# Patient Record
Sex: Female | Born: 1950
Health system: Southern US, Community
[De-identification: ages and names within clinical notes are randomized; demographics above are authoritative.]

## PROBLEM LIST (undated history)

## (undated) DIAGNOSIS — Z8489 Family history of other specified conditions: Secondary | ICD-10-CM

## (undated) DIAGNOSIS — M1990 Unspecified osteoarthritis, unspecified site: Secondary | ICD-10-CM

## (undated) DIAGNOSIS — T4145XA Adverse effect of unspecified anesthetic, initial encounter: Secondary | ICD-10-CM

## (undated) DIAGNOSIS — Z86018 Personal history of other benign neoplasm: Secondary | ICD-10-CM

## (undated) DIAGNOSIS — Z8041 Family history of malignant neoplasm of ovary: Secondary | ICD-10-CM

## (undated) DIAGNOSIS — L4050 Arthropathic psoriasis, unspecified: Secondary | ICD-10-CM

## (undated) DIAGNOSIS — Z806 Family history of leukemia: Secondary | ICD-10-CM

## (undated) DIAGNOSIS — T8859XA Other complications of anesthesia, initial encounter: Secondary | ICD-10-CM

## (undated) DIAGNOSIS — C801 Malignant (primary) neoplasm, unspecified: Secondary | ICD-10-CM

## (undated) DIAGNOSIS — B009 Herpesviral infection, unspecified: Secondary | ICD-10-CM

## (undated) DIAGNOSIS — R9389 Abnormal findings on diagnostic imaging of other specified body structures: Secondary | ICD-10-CM

## (undated) HISTORY — DX: Family history of malignant neoplasm of ovary: Z80.41

## (undated) HISTORY — DX: Abnormal findings on diagnostic imaging of other specified body structures: R93.89

## (undated) HISTORY — DX: Herpesviral infection, unspecified: B00.9

## (undated) HISTORY — DX: Family history of leukemia: Z80.6

## (undated) HISTORY — DX: Personal history of other benign neoplasm: Z86.018

## (undated) HISTORY — DX: Arthropathic psoriasis, unspecified: L40.50

## (undated) HISTORY — PX: BREAST SURGERY: SHX581

---

## 1981-12-22 HISTORY — PX: THYROIDECTOMY, PARTIAL: SHX18

## 1998-04-19 ENCOUNTER — Ambulatory Visit (HOSPITAL_COMMUNITY): Admission: RE | Admit: 1998-04-19 | Discharge: 1998-04-19 | Payer: Self-pay | Admitting: Podiatry

## 1998-07-12 ENCOUNTER — Ambulatory Visit (HOSPITAL_COMMUNITY): Admission: RE | Admit: 1998-07-12 | Discharge: 1998-07-12 | Payer: Self-pay | Admitting: Podiatry

## 1999-04-22 ENCOUNTER — Other Ambulatory Visit: Admission: RE | Admit: 1999-04-22 | Discharge: 1999-04-22 | Payer: Self-pay | Admitting: Obstetrics and Gynecology

## 1999-05-02 ENCOUNTER — Ambulatory Visit (HOSPITAL_COMMUNITY): Admission: RE | Admit: 1999-05-02 | Discharge: 1999-05-02 | Payer: Self-pay | Admitting: Psychiatry

## 1999-08-30 ENCOUNTER — Ambulatory Visit (HOSPITAL_COMMUNITY): Admission: RE | Admit: 1999-08-30 | Discharge: 1999-08-30 | Payer: Self-pay | Admitting: Psychiatry

## 2000-07-09 ENCOUNTER — Other Ambulatory Visit: Admission: RE | Admit: 2000-07-09 | Discharge: 2000-07-09 | Payer: Self-pay | Admitting: Obstetrics and Gynecology

## 2001-09-23 ENCOUNTER — Other Ambulatory Visit: Admission: RE | Admit: 2001-09-23 | Discharge: 2001-09-23 | Payer: Self-pay | Admitting: Obstetrics and Gynecology

## 2002-01-04 ENCOUNTER — Encounter: Admission: RE | Admit: 2002-01-04 | Discharge: 2002-01-04 | Payer: Self-pay | Admitting: Psychiatry

## 2002-02-22 ENCOUNTER — Encounter: Admission: RE | Admit: 2002-02-22 | Discharge: 2002-02-22 | Payer: Self-pay | Admitting: Psychiatry

## 2002-07-06 ENCOUNTER — Encounter: Admission: RE | Admit: 2002-07-06 | Discharge: 2002-07-06 | Payer: Self-pay | Admitting: Psychiatry

## 2002-07-21 ENCOUNTER — Encounter: Admission: RE | Admit: 2002-07-21 | Discharge: 2002-07-21 | Payer: Self-pay | Admitting: Psychiatry

## 2002-11-08 ENCOUNTER — Other Ambulatory Visit: Admission: RE | Admit: 2002-11-08 | Discharge: 2002-11-08 | Payer: Self-pay | Admitting: Obstetrics and Gynecology

## 2003-06-22 ENCOUNTER — Encounter: Admission: RE | Admit: 2003-06-22 | Discharge: 2003-06-22 | Payer: Self-pay | Admitting: Psychiatry

## 2004-01-24 ENCOUNTER — Other Ambulatory Visit: Admission: RE | Admit: 2004-01-24 | Discharge: 2004-01-24 | Payer: Self-pay | Admitting: Obstetrics and Gynecology

## 2005-04-24 ENCOUNTER — Other Ambulatory Visit: Admission: RE | Admit: 2005-04-24 | Discharge: 2005-04-24 | Payer: Self-pay | Admitting: Obstetrics and Gynecology

## 2005-06-05 ENCOUNTER — Emergency Department (HOSPITAL_COMMUNITY): Admission: EM | Admit: 2005-06-05 | Discharge: 2005-06-06 | Payer: Self-pay | Admitting: Emergency Medicine

## 2005-07-22 HISTORY — PX: COLONOSCOPY: SHX174

## 2005-08-08 ENCOUNTER — Ambulatory Visit (HOSPITAL_COMMUNITY): Admission: RE | Admit: 2005-08-08 | Discharge: 2005-08-08 | Payer: Self-pay | Admitting: Gastroenterology

## 2005-11-21 DIAGNOSIS — R9389 Abnormal findings on diagnostic imaging of other specified body structures: Secondary | ICD-10-CM

## 2005-11-21 HISTORY — DX: Abnormal findings on diagnostic imaging of other specified body structures: R93.89

## 2005-12-09 ENCOUNTER — Ambulatory Visit (HOSPITAL_COMMUNITY): Admission: RE | Admit: 2005-12-09 | Discharge: 2005-12-09 | Payer: Self-pay | Admitting: Internal Medicine

## 2005-12-22 DIAGNOSIS — Z86018 Personal history of other benign neoplasm: Secondary | ICD-10-CM

## 2005-12-22 HISTORY — DX: Personal history of other benign neoplasm: Z86.018

## 2006-01-09 ENCOUNTER — Ambulatory Visit (HOSPITAL_COMMUNITY): Admission: RE | Admit: 2006-01-09 | Discharge: 2006-01-09 | Payer: Self-pay | Admitting: Otolaryngology

## 2006-03-17 ENCOUNTER — Ambulatory Visit (HOSPITAL_COMMUNITY): Payer: Self-pay | Admitting: Psychiatry

## 2006-06-05 ENCOUNTER — Other Ambulatory Visit: Admission: RE | Admit: 2006-06-05 | Discharge: 2006-06-05 | Payer: Self-pay | Admitting: Obstetrics and Gynecology

## 2006-07-06 ENCOUNTER — Ambulatory Visit (HOSPITAL_COMMUNITY): Payer: Self-pay | Admitting: Psychiatry

## 2006-08-05 ENCOUNTER — Encounter: Admission: RE | Admit: 2006-08-05 | Discharge: 2006-08-05 | Payer: Self-pay | Admitting: Internal Medicine

## 2006-10-05 ENCOUNTER — Ambulatory Visit (HOSPITAL_COMMUNITY): Payer: Self-pay | Admitting: Psychiatry

## 2006-12-09 ENCOUNTER — Encounter: Admission: RE | Admit: 2006-12-09 | Discharge: 2006-12-09 | Payer: Self-pay | Admitting: Internal Medicine

## 2007-07-09 ENCOUNTER — Other Ambulatory Visit: Admission: RE | Admit: 2007-07-09 | Discharge: 2007-07-09 | Payer: Self-pay | Admitting: Obstetrics and Gynecology

## 2007-07-27 ENCOUNTER — Encounter: Admission: RE | Admit: 2007-07-27 | Discharge: 2007-07-27 | Payer: Self-pay | Admitting: Internal Medicine

## 2008-02-15 ENCOUNTER — Encounter: Admission: RE | Admit: 2008-02-15 | Discharge: 2008-02-15 | Payer: Self-pay | Admitting: Internal Medicine

## 2008-08-06 IMAGING — CT CT CHEST W/O CM
2 of 3 series · 15 of 36 positions shown, 18 images · IV contrast (agent unspecified)
Comparison: 07/27/07 and multiple previous films.

CLINICAL DATA: Follow-up lung nodule. 
 CHEST CT WITHOUT CONTRAST:
TECHNIQUE: Multidetector CT imaging of the chest was performed following the standard protocol without IV contrast.

[Series 2: routine chest · axial · 0.70mm/px · z∈[-245,+10]mm · 12 of 61 slices shown, 15 images]
[im 5/61  mediastinal]
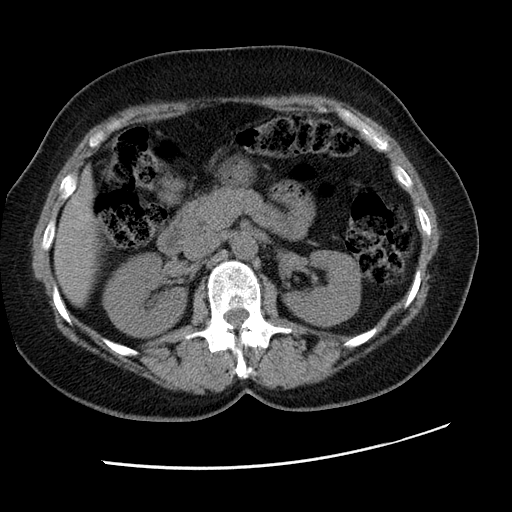
[im 5/61  lung]
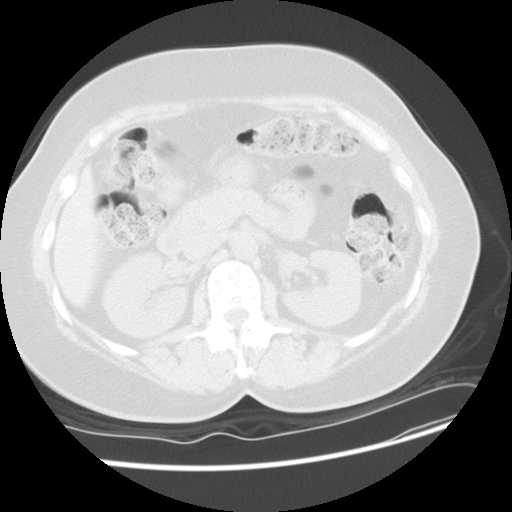
[im 9/61  lung]
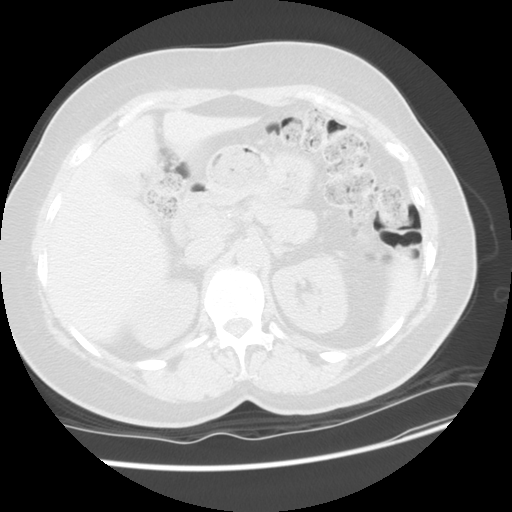
[im 14/61  lung]
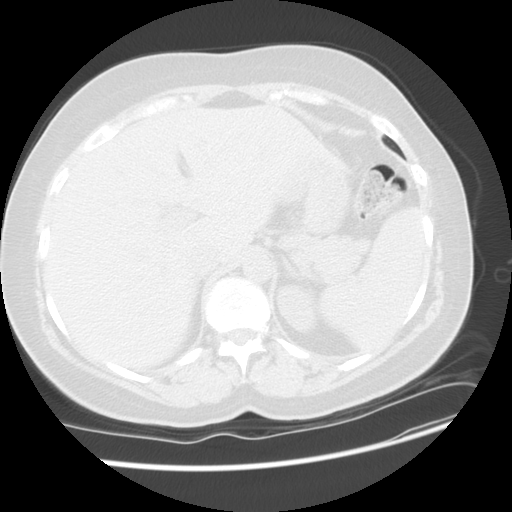
[im 18/61  lung]
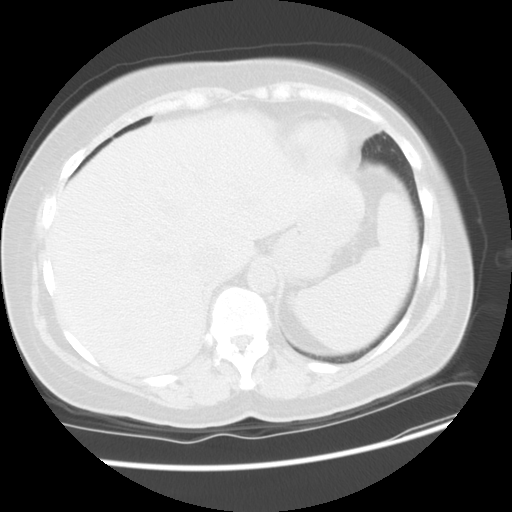
[im 23/61  mediastinal]
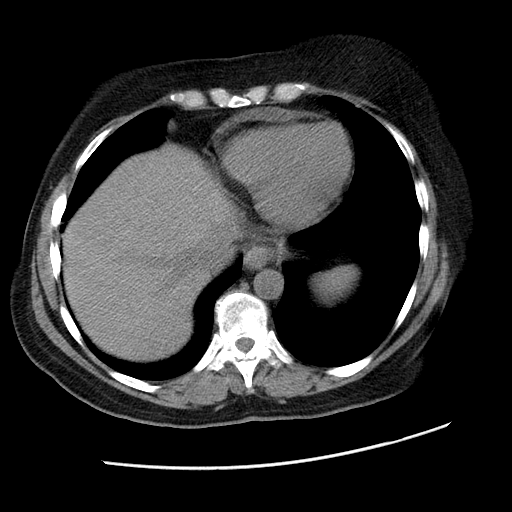
[im 23/61  lung]
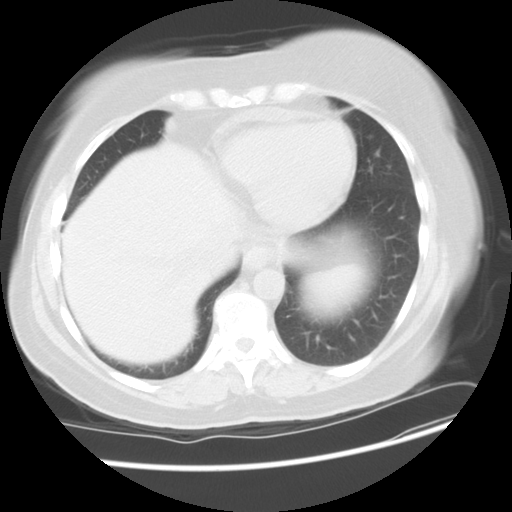
[im 27/61  lung]
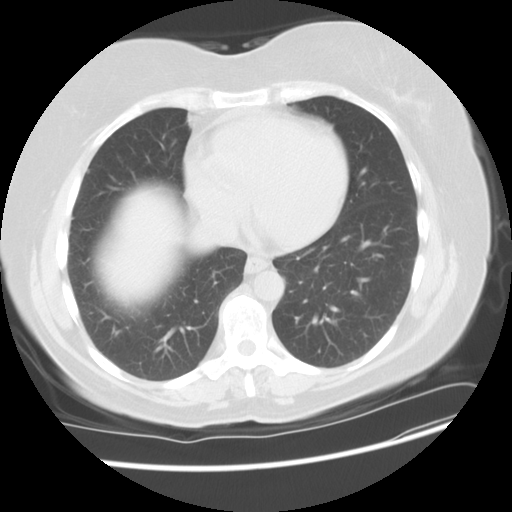
[im 34/61  lung]
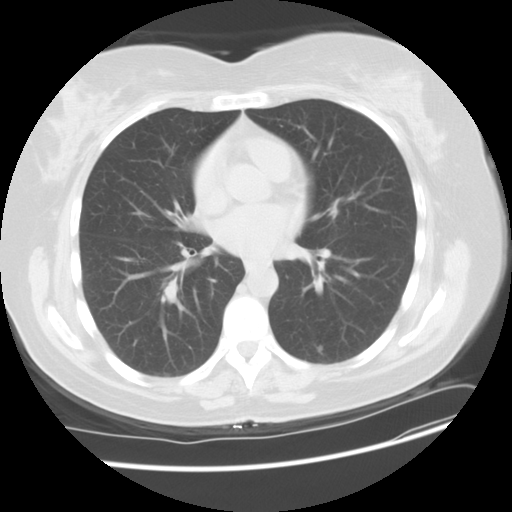
[im 38/61  lung]
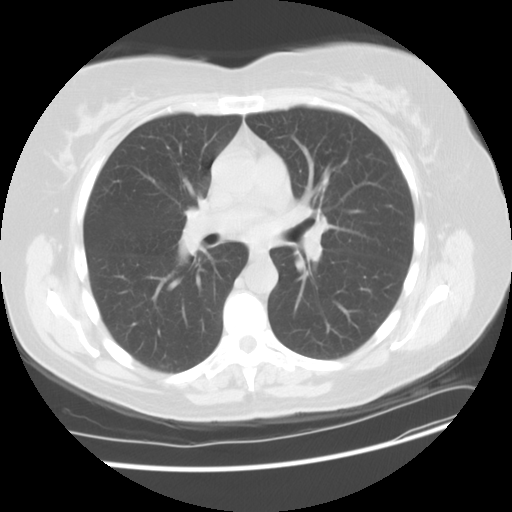
[im 43/61  mediastinal]
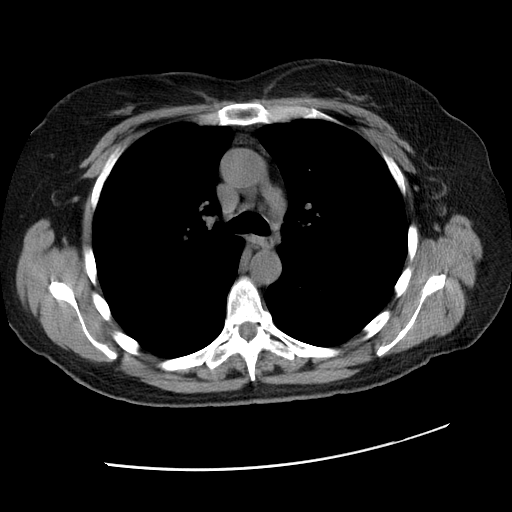
[im 43/61  lung]
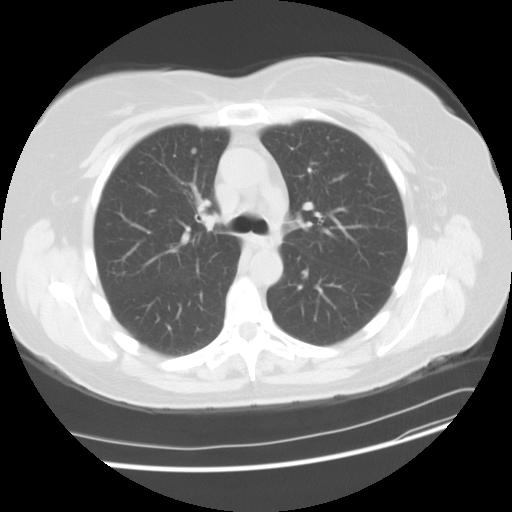
[im 47/61  lung]
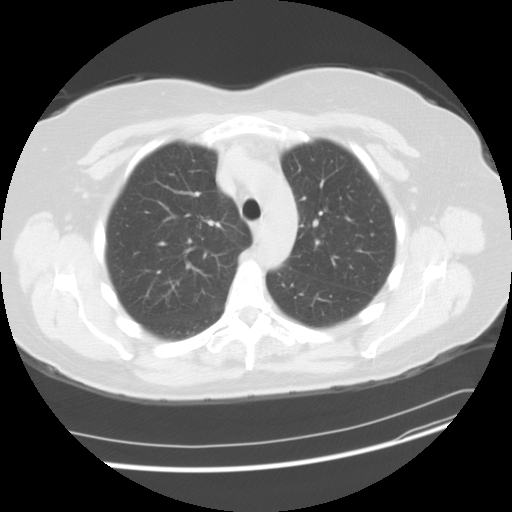
[im 52/61  lung]
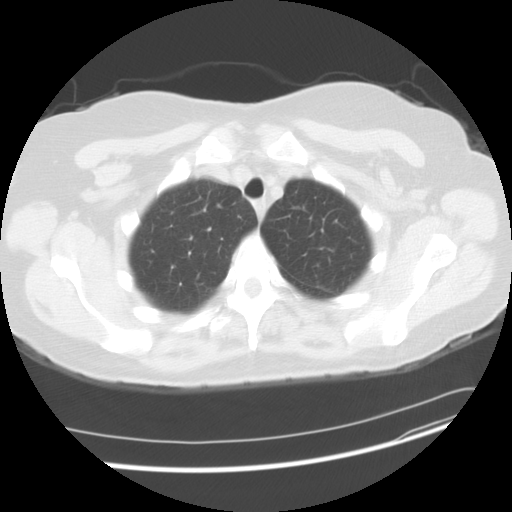
[im 56/61  lung]
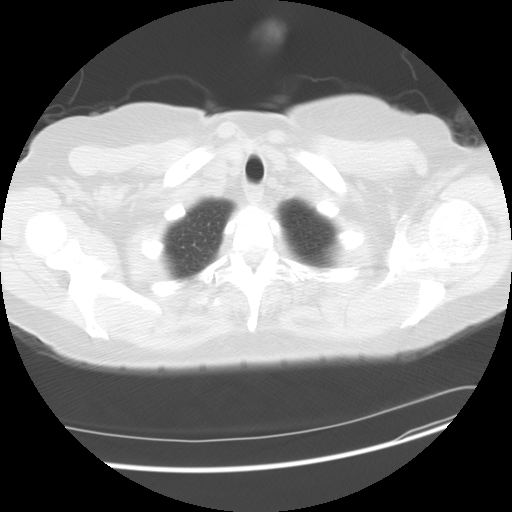

[Series 401: coronal · coronal · 0.70mm/px · 3 of 120 slices shown]
[im 24/120  lung]
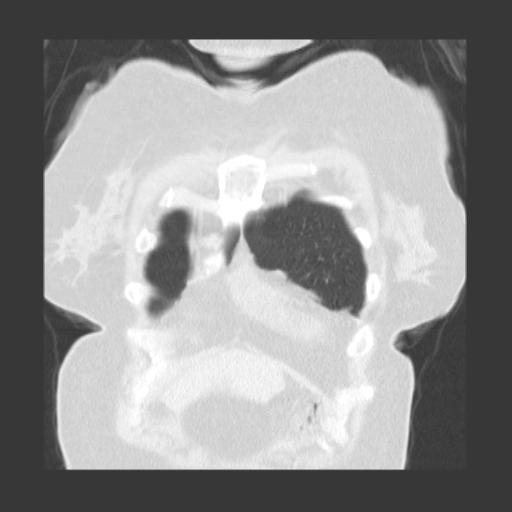
[im 48/120  lung]
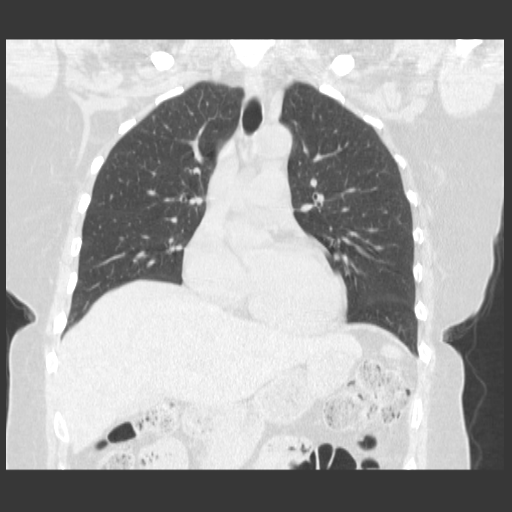
[im 72/120  lung]
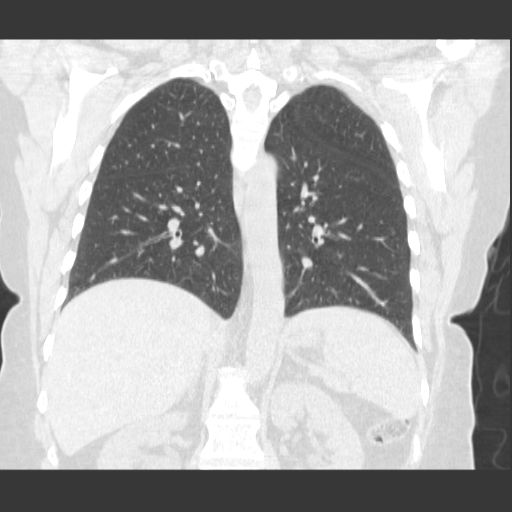

[15 of 36 positions shown; findings below may reference images not displayed]

FINDINGS: A 3 mm nodule previously described in the left lower lobe on image #24 is not evident to my evaluation.  Tiny subpleural densities depicted elsewhere in both lungs appear the same.  A 4 mm nodule anteriorly in the right upper lobe on image 19 is the same.  A 4 to 5 mm nodule posteriorly in the left lower lobe on image 29 is the same.  A 2 mm nodule anteriorly in the left upper lobe on image 34 is the same.  There are no worrisome features.  A small amount of pericardial fluid remains evident.  No pleural fluid.  No adenopathy or mass.  Scans in the upper abdomen remain unremarkable.
IMPRESSION: 1.  Chronically noted nodules all appear the same and are undoubtedly benign. 
 2.  Tiny nodule questioned in the left lower lobe on the previous exam cannot be identified today.  This does not concern me based on the fact that it was such a minimal finding on the previous study and may not even definitely have represented a nodule.  In any case, there is nothing seen in that area today.

## 2008-08-31 ENCOUNTER — Other Ambulatory Visit: Admission: RE | Admit: 2008-08-31 | Discharge: 2008-08-31 | Payer: Self-pay | Admitting: Obstetrics and Gynecology

## 2011-05-09 NOTE — Op Note (Signed)
Samantha Sullivan, TRUXILLO NO.:  [id number]   MEDICAL RECORD NO.:  [id number]          PATIENT TYPE:  AMB   LOCATION:  ENDO                         FACILITY:  [facility]   PHYSICIAN:  Anselmo Rod, M.D.  DATE OF BIRTH:  Aug 22, 1951   DATE OF PROCEDURE:  08/08/2005  DATE OF DISCHARGE:                                 OPERATIVE REPORT   PROCEDURE PERFORMED:  Screening colonoscopy.   ENDOSCOPIST:  Anselmo Rod, M.D.   INSTRUMENT USED:  Olympus video colonoscope.   INDICATIONS FOR PROCEDURE:  A 60 year old white female underwent a screening  colonoscopy to rule out colonic polyps, masses, etc.   PREPROCEDURE PREPARATION:  Informed consent was procured from the patient.  The patient was fasted for eight hours prior to procedure and prepped with a  bottle of magnesium citrate and gallon of GoLYTELY the night prior to  procedure.  Risks and benefits of the procedure including a 10% risk rate of  cancer and polyps were discussed with her as well.   PREPROCEDURE PHYSICAL:  VITAL SIGNS:  The patient has stable vital signs.  NECK:  Supple.  CHEST:  Clear to auscultation.  CARDIAC:  S1, S2 regular.  ABDOMEN:  Soft with normal bowel sounds.   DESCRIPTION OF PROCEDURE:  The patient was placed in the left lateral  decubitus position and sedated with 50 mg of Demerol and 5 mg of Versed in  slow incremental doses.  Once the patient was adequately sedated and  maintained on low-flow oxygen and continuous cardiac monitoring, the Olympus  video colonoscope was advanced from the rectum to the cecum.  The  appendicular orifice and ileocecal valve were clearly visualized and  photographed.  No masses, polyps, erosions, ulcerations, or diverticula was  seen.  Retroflexion revealed some small internal hemorrhoids.  The patient  tolerated the procedure well without complications.   IMPRESSION:  1.  Normal colonoscopy of the cecum except for small nonbleeding internal       hemorrhoids.  2.  No masses or polyps seen.  3.  No evidence of diverticulosis.   RECOMMENDATIONS:  1.  Continue high fiber diet with liberal fluid intake.  2.  Repeat colonoscopy in the next 10 years unless the patient were to      develop any abnormal symptoms in the interim.  In which case, she should      contact the office immediately.  3.  Outpatient followup as need arises in the future.      Anselmo Rod, M.D.  Electronically Signed     JNM/MEDQ  D:  08/08/2005  T:  08/08/2005  Job:  [id number]   cc:   Gwen Pounds, MD  Fax: [phone]

## 2013-10-24 ENCOUNTER — Encounter: Payer: Self-pay | Admitting: Nurse Practitioner

## 2013-10-24 ENCOUNTER — Ambulatory Visit (INDEPENDENT_AMBULATORY_CARE_PROVIDER_SITE_OTHER): Payer: Commercial Managed Care - PPO | Admitting: Nurse Practitioner

## 2013-10-24 VITALS — BP 120/76 | HR 64 | Resp 16 | Ht 65.0 in | Wt 161.0 lb

## 2013-10-24 DIAGNOSIS — Z01419 Encounter for gynecological examination (general) (routine) without abnormal findings: Secondary | ICD-10-CM

## 2013-10-24 DIAGNOSIS — E559 Vitamin D deficiency, unspecified: Secondary | ICD-10-CM

## 2013-10-24 MED ORDER — VITAMIN D (ERGOCALCIFEROL) 1.25 MG (50000 UNIT) PO CAPS: 50000.0000 [IU] | ORAL_CAPSULE | ORAL | Status: DC

## 2013-10-24 MED ORDER — ESTRADIOL 0.5 MG PO TABS: 0.5000 mg | ORAL_TABLET | Freq: Every day | ORAL | Status: DC

## 2013-10-24 MED ORDER — PROGESTERONE MICRONIZED 100 MG PO CAPS: 100.0000 mg | ORAL_CAPSULE | Freq: Every day | ORAL | Status: DC

## 2013-10-24 NOTE — Patient Instructions (Signed)

## 2013-10-24 NOTE — Progress Notes (Signed)
Patient ID: Samantha Sullivan, female   DOB: 02-20-1951, 62 y.o.   MRN: [id number] 62 y.o. [id number] Single Caucasian Fe here for annual exam. No complaints about vaginal dryness, not SA.  Rare vaso symptoms.   Mother is doing the same, still wheelchair bound secondary to spinal stenosis. She is in nursing home.   She still plans on working 2 more years.  No LMP recorded. Patient is postmenopausal.    (LMP was in 2001)      Sexually active: no  The current method of family planning is abstinence.    Exercising: yes  Gym/ health club routine includes light weights and walking and pilates everyday.. Smoker:  Former   Health Maintenance: Pap:  10/15/12, WNL, neg HR HPV MMG:  10/20/12, Bi-Rads 1: negative Colonoscopy:  2006 BMD:   07/28/06, normal TDaP:  UTD  plans to check with PCP Labs: PCP-Dr. Timothy Lasso   reports that she quit smoking about 32 years ago. She has never used smokeless tobacco. She reports that she does not drink alcohol or use illicit drugs.  Past Medical History  Diagnosis Date  . Depression   . HSV infection   . Psoriatic arthritis   . History of meningioma 2007    in 2013 stable on MRI and recheck in 5 years  . Abnormal CT of the chest 12/06    numeroous lung nodules - most have resolved.    Past Surgical History  Procedure Laterality Date  . Thyroidectomy, partial Left 1983  . Breast surgery Left 1971    lumpectomy benign  . Colonoscopy  8/06    normal and recheck in 10 years    Current Outpatient Prescriptions  Medication Sig Dispense Refill  . EPIPEN 2-PAK 0.3 MG/0.3ML SOAJ injection as needed.      Marland Kitchen estradiol (ESTRACE) 0.5 MG tablet Take 1 tablet (0.5 mg total) by mouth daily.  90 tablet  3  . folic acid (FOLVITE) 1 MG tablet Take 1 tablet by mouth daily.      . meloxicam (MOBIC) 15 MG tablet Take 15 mg by mouth as needed for pain.      . methotrexate (RHEUMATREX) 2.5 MG tablet Take 8 tablets by mouth once a week.      . Multiple Vitamin (MULTIVITAMIN) tablet  Take 1 tablet by mouth daily.      . progesterone (PROMETRIUM) 100 MG capsule Take 1 capsule (100 mg total) by mouth daily.  90 capsule  3  . Vitamin D, Ergocalciferol, (DRISDOL) 50000 UNITS CAPS capsule Take 1 capsule (50,000 Units total) by mouth once a week.  30 capsule  3   No current facility-administered medications for this visit.    Family History  Problem Relation Age of Onset  . Osteoporosis Mother   . Osteoporosis Sister     ROS:  Pertinent items are noted in HPI.  Otherwise, a comprehensive ROS was negative.  Exam:   BP 120/76  Pulse 64  Resp 16  Ht 5\' 5"  (1.651 m)  Wt 161 lb (73.029 kg)  BMI 26.79 kg/m2 Height: 5\' 5"  (165.1 cm)  Ht Readings from Last 3 Encounters:  10/24/13 5\' 5"  (1.651 m)   Weight last year - 10/15/12 was 191 lbs.  . General appearance: alert, cooperative and appears stated age Head: Normocephalic, without obvious abnormality, atraumatic Neck: no adenopathy, supple, symmetrical, trachea midline and thyroid normal to inspection and palpation Lungs: clear to auscultation bilaterally Breasts: normal appearance, no masses or tenderness Heart: regular rate and rhythm  Abdomen: soft, non-tender; no masses,  no organomegaly Extremities: extremities normal, atraumatic, no cyanosis or edema Skin: Skin color, texture, turgor normal. No rashes or lesions Lymph nodes: Cervical, supraclavicular, and axillary nodes normal. No abnormal inguinal nodes palpated Neurologic: Grossly normal   Pelvic: External genitalia:  no lesions              Urethra:  normal appearing urethra with no masses, tenderness or lesions              Bartholin's and Skene's: normal                 Vagina: normal appearing vagina with normal color and discharge, no lesions              Cervix: anteverted              Pap taken: no Bimanual Exam:  Uterus:  normal size, contour, position, consistency, mobility, non-tender              Adnexa: no mass, fullness, tenderness                Rectovaginal: Confirms               Anus:  normal sphincter tone, no lesions  A:  Well Woman with normal exam  Postmenopausal on HRT since 04/1999  History of Meningioma brain  History of Psoriatic arthritis  Vit D deficiency  Weight Loss intentional    P:   Pap smear as per guidelines   Mammogram is scheduled  Refill on HRT X 1 year  - discussed trying to wean down by 1/2 tablet of Estradiol over this next year  Counseled with risk of HRT of DVT, CVA, cancer, etc  Follow with Vit D labs  Counseled on breast self exam, adequate intake of calcium and vitamin D, diet and exercise return annually or prn  An After Visit Summary was printed and given to the patient.

## 2013-10-26 NOTE — Progress Notes (Signed)
Encounter reviewed by Dr. Skyla Champagne Silva.  

## 2013-11-10 ENCOUNTER — Other Ambulatory Visit: Payer: Self-pay | Admitting: Nurse Practitioner

## 2013-11-30 ENCOUNTER — Encounter: Payer: Self-pay | Admitting: Nurse Practitioner

## 2014-02-21 ENCOUNTER — Telehealth: Payer: Self-pay | Admitting: Nurse Practitioner

## 2014-02-21 NOTE — Telephone Encounter (Signed)
Patient has a question about her account.

## 2014-03-14 ENCOUNTER — Other Ambulatory Visit: Payer: Self-pay | Admitting: Nurse Practitioner

## 2014-10-23 ENCOUNTER — Encounter: Payer: Self-pay | Admitting: Nurse Practitioner

## 2014-10-31 ENCOUNTER — Encounter: Payer: Self-pay | Admitting: Nurse Practitioner

## 2014-10-31 ENCOUNTER — Ambulatory Visit (INDEPENDENT_AMBULATORY_CARE_PROVIDER_SITE_OTHER): Payer: Commercial Managed Care - PPO | Admitting: Nurse Practitioner

## 2014-10-31 VITALS — BP 116/76 | HR 70 | Resp 14 | Ht 65.0 in | Wt 179.6 lb

## 2014-10-31 DIAGNOSIS — Z01419 Encounter for gynecological examination (general) (routine) without abnormal findings: Secondary | ICD-10-CM

## 2014-10-31 MED ORDER — PROGESTERONE MICRONIZED 100 MG PO CAPS: 100.0000 mg | ORAL_CAPSULE | Freq: Every day | ORAL | Status: DC

## 2014-10-31 MED ORDER — ESTRADIOL 0.5 MG PO TABS: 0.5000 mg | ORAL_TABLET | Freq: Every day | ORAL | Status: DC

## 2014-10-31 NOTE — Patient Instructions (Addendum)

## 2014-10-31 NOTE — Progress Notes (Signed)
63 y.o. [id number] Single Caucasian Fe here for annual exam. She is doing well on HRT and wants to continue.  Less vaso symptoms on estrogen.  No new problems. Mother in assisted living but confined to wheelchair.  Patient's last menstrual period was 03/22/2000.          Sexually active: No.  The current method of family planning is post menopausal status.    Exercising: Yes.    Walking,pilates, Physiological scientist 1x/wk Smoker:  no  Health Maintenance: Pap:  10/13/12 NEG HR HPV not indicated  MMG:  10/26/14- Normal  Colonoscopy:  07/2005 negative, repeat in 7 years   BMD:   07/28/2006 - normal  TDaP:  Within 10 years Labs: Shon Baton, MD    reports that she quit smoking about 33 years ago. She has never used smokeless tobacco. She reports that she does not drink alcohol or use illicit drugs.  Past Medical History  Diagnosis Date  . Depression   . HSV infection   . Psoriatic arthritis   . History of meningioma 2007    in 2013 stable on MRI and recheck in 5 years  . Abnormal CT of the chest 12/06    numeroous lung nodules - most have resolved.    Past Surgical History  Procedure Laterality Date  . Thyroidectomy, partial Left 1983  . Breast surgery Left 1971    lumpectomy benign  . Colonoscopy  8/06    normal and recheck in 10 years    Current Outpatient Prescriptions  Medication Sig Dispense Refill  . EPIPEN 2-PAK 0.3 MG/0.3ML SOAJ injection as needed.    Marland Kitchen estradiol (ESTRACE) 0.5 MG tablet Take 1 tablet (0.5 mg total) by mouth daily. 90 tablet 3  . folic acid (FOLVITE) 1 MG tablet Take 1 tablet by mouth daily.    . meloxicam (MOBIC) 15 MG tablet Take 15 mg by mouth as needed for pain.    . methotrexate (RHEUMATREX) 2.5 MG tablet Take 8 tablets by mouth once a week.    . Multiple Vitamin (MULTIVITAMIN) tablet Take 1 tablet by mouth daily.    . progesterone (PROMETRIUM) 100 MG capsule Take 1 capsule (100 mg total) by mouth daily. 90 capsule 3   No current facility-administered  medications for this visit.    Family History  Problem Relation Age of Onset  . Osteoporosis Mother   . Osteoporosis Sister     ROS:  Pertinent items are noted in HPI.  Otherwise, a comprehensive ROS was negative.  Exam:   BP 116/76 mmHg  Pulse 70  Resp 14  Ht 5\' 5"  (1.651 m)  Wt 179 lb 9.6 oz (81.466 kg)  BMI 29.89 kg/m2  LMP 03/22/2000 Height: 5\' 5"  (165.1 cm)  Ht Readings from Last 3 Encounters:  10/31/14 5\' 5"  (1.651 m)  10/24/13 5\' 5"  (1.651 m)    General appearance: alert, cooperative and appears stated age Head: Normocephalic, without obvious abnormality, atraumatic Neck: no adenopathy, supple, symmetrical, trachea midline and thyroid normal to inspection and palpation Lungs: clear to auscultation bilaterally Breasts: normal appearance, no masses or tenderness Heart: regular rate and rhythm Abdomen: soft, non-tender; no masses,  no organomegaly Extremities: extremities normal, atraumatic, no cyanosis or edema Skin: Skin color, texture, turgor normal. No rashes or lesions Lymph nodes: Cervical, supraclavicular, and axillary nodes normal. No abnormal inguinal nodes palpated Neurologic: Grossly normal   Pelvic: External genitalia:  no lesions              Urethra:  normal appearing urethra with no masses, tenderness or lesions              Bartholin's and Skene's: normal                 Vagina: normal appearing vagina with normal color and discharge, no lesions              Cervix: anteverted              Pap taken: No. Bimanual Exam:  Uterus:  normal size, contour, position, consistency, mobility, non-tender              Adnexa: no mass, fullness, tenderness               Rectovaginal: Confirms               Anus:  normal sphincter tone, no lesions  A:  Well Woman with normal exam  Postmenopausal on HRT since 04/1999 History of Meningioma brain History of Psoriatic arthritis Vit D deficiency   P:    Reviewed health and wellness pertinent to exam  Pap smear not taken today  Mammogram is due 11/16  Refill on Estradiol and Prometrium for a year  Counseled with risk of DVT, CVA, cancer, etc.  Counseled on breast self exam, mammography screening, adequate intake of calcium and vitamin D, diet and exercise return annually or prn  An After Visit Summary was printed and given to the patient.

## 2014-11-02 NOTE — Progress Notes (Signed)
Encounter reviewed by Dr. Brook Silva.  

## 2014-11-06 ENCOUNTER — Other Ambulatory Visit: Payer: Self-pay | Admitting: Nurse Practitioner

## 2014-12-22 HISTORY — PX: HAMMER TOE SURGERY: SHX385

## 2014-12-24 ENCOUNTER — Other Ambulatory Visit: Payer: Self-pay | Admitting: Nurse Practitioner

## 2015-03-26 ENCOUNTER — Other Ambulatory Visit: Payer: Self-pay | Admitting: *Deleted

## 2015-03-26 MED ORDER — ESTRADIOL 0.5 MG PO TABS: 0.5000 mg | ORAL_TABLET | Freq: Every day | ORAL | Status: DC

## 2015-03-26 MED ORDER — PROGESTERONE MICRONIZED 100 MG PO CAPS: 100.0000 mg | ORAL_CAPSULE | Freq: Every day | ORAL | Status: DC

## 2015-03-26 NOTE — Telephone Encounter (Signed)
Both rx's were sent to [facility], we had received a request from patient's mail order ([facility]) for 90 day supply.

## 2015-03-26 NOTE — Telephone Encounter (Addendum)
Medication refill request: Estradiol 0.5 mg;  Progesterone 100 mg (Requesting 90 day Supply) Last AEX:  10/31/14 with PG  Next AEX: No AEX scheduled for 2016 Last MMG (if hormonal medication request): 10/26/14 Bi-Rads 2: Benign Refill authorized: #90/2 rfs, please advise.

## 2015-03-26 NOTE — Telephone Encounter (Signed)
Faxed fax to [facility] stating that rx has been sent to pharmacy.

## 2015-03-26 NOTE — Telephone Encounter (Signed)
Per AEX note 10/2014 the refill was given correctly for a year,  Not sure why she needs a refill.  But it is done.

## 2015-03-28 ENCOUNTER — Other Ambulatory Visit: Payer: Self-pay | Admitting: *Deleted

## 2015-03-28 MED ORDER — ESTRADIOL 0.5 MG PO TABS: 0.5000 mg | ORAL_TABLET | Freq: Every day | ORAL | Status: DC

## 2015-03-28 MED ORDER — PROGESTERONE MICRONIZED 100 MG PO CAPS
100.0000 mg | ORAL_CAPSULE | Freq: Every day | ORAL | Status: DC
Start: 2015-03-28 — End: 2015-11-10

## 2015-03-28 NOTE — Telephone Encounter (Addendum)
Medication refill request: Estrace and Progesterone  Last AEX: 10/31/14 PG Next AEX: not scheduled  Last MMG (if hormonal medication request): 10/26/14 BIRADS2:Benign  Refill authorized: Both Rx sent 03/26/15 #90tab/2R to [place].  Request from [place]. New Rx sent to [facility] as prescribed 03/26/15 by PG.  Routed to [place] for final review Encounter closed.

## 2015-03-29 NOTE — Telephone Encounter (Signed)
she was given RX for a year at [place] in November - why a refill now.

## 2015-03-30 NOTE — Telephone Encounter (Signed)
November [facility]. New Request coming from [place].

## 2015-08-09 ENCOUNTER — Encounter: Payer: Self-pay | Admitting: Podiatry

## 2015-08-09 ENCOUNTER — Ambulatory Visit (INDEPENDENT_AMBULATORY_CARE_PROVIDER_SITE_OTHER): Payer: Commercial Managed Care - PPO | Admitting: Podiatry

## 2015-08-09 ENCOUNTER — Ambulatory Visit: Payer: Self-pay

## 2015-08-09 VITALS — BP 134/84 | HR 63 | Resp 16

## 2015-08-09 DIAGNOSIS — L84 Corns and callosities: Secondary | ICD-10-CM

## 2015-08-09 DIAGNOSIS — M79676 Pain in unspecified toe(s): Secondary | ICD-10-CM

## 2015-08-09 DIAGNOSIS — M779 Enthesopathy, unspecified: Secondary | ICD-10-CM | POA: Diagnosis not present

## 2015-08-09 DIAGNOSIS — M2042 Other hammer toe(s) (acquired), left foot: Secondary | ICD-10-CM | POA: Diagnosis not present

## 2015-08-09 MED ORDER — TRIAMCINOLONE ACETONIDE 10 MG/ML IJ SUSP
10.0000 mg | Freq: Once | INTRAMUSCULAR | Status: AC
  Administered 2015-08-09: 10 mg

## 2015-08-09 NOTE — Progress Notes (Signed)
Subjective:     Patient ID: Samantha Sullivan, female   DOB: [birth year], 64 y.o.   MRN: [id number]  HPI patient presents with left fourth toe very painful of several months duration stating she's tried padding trimming and wear different wider shoes without relief and states it one time she had problems with the right toe but that resolved on its own   Review of Systems  All other systems reviewed and are negative.      Objective:   Physical Exam  Constitutional: She is oriented to person, place, and time.  Cardiovascular: Intact distal pulses.   Musculoskeletal: Normal range of motion.  Neurological: She is oriented to person, place, and time.  Skin: Skin is warm.  Nursing note and vitals reviewed.  neurovascular status intact with muscle strength adequate and range of motion within normal limits. Patient's noted to have good digital perfusion is well oriented 3 and is noted to have a painful keratotic lesion on the lateral side of the proximal phalanx fourth toe left with fluid buildup and pain. There is also noted to be a rotation of the fifth toe pressing against the fourth toe causing pain and there is structural keratotic tissue formation     Assessment:     Inflammatory capsulitis of the interphalangeal joint fourth toe along with hammertoe deformity and corn callus formation    Plan:     H&P and x-rays reviewed with patient. Reviewed causes of this problem and discussed treatment and we'll get a try conservative before considering surgical arthroplasty procedure. Today I did a careful capsular injection of the interphalangeal joint with 2 mg dexamethasone Kenalog 5 mg Xylocaine and after appropriate numbness I debrided the lesion fully and applied dressing. I then dispensed padding to take pressure off the toes and patient will reappoint as needed and may require surgery depending on response

## 2015-08-09 NOTE — Progress Notes (Signed)
   Subjective:    Patient ID: Samantha Sullivan, female    DOB: 1951/08/12, 64 y.o.   MRN: [id number]  HPI Pt presents with left 4th toe pain, callus/corn formation. Worsening with time, has tried soaking  Review of Systems  All other systems reviewed and are negative.      Objective:   Physical Exam        Assessment & Plan:

## 2015-10-19 ENCOUNTER — Encounter: Payer: Self-pay | Admitting: Podiatry

## 2015-10-19 ENCOUNTER — Ambulatory Visit (INDEPENDENT_AMBULATORY_CARE_PROVIDER_SITE_OTHER): Payer: Commercial Managed Care - PPO | Admitting: Podiatry

## 2015-10-19 VITALS — BP 110/68 | HR 67 | Resp 16

## 2015-10-19 DIAGNOSIS — M2042 Other hammer toe(s) (acquired), left foot: Secondary | ICD-10-CM

## 2015-10-21 NOTE — Progress Notes (Signed)
Subjective:     Patient ID: Samantha Sullivan, female   DOB: Mar 30, 1951, 64 y.o.   MRN: [id number]  HPI patient presents stating my left toes are really bothering me and it has not improved with the treatments that have been done. Patient has tried to modify shoe gear has tried trimming padding without relief of symptoms   Review of Systems     Objective:   Physical Exam Neurovascular status intact muscle strength adequate range of motion within normal limits with severe keratotic lesions between the fourth and fifth toes on the left foot that are very painful when pressed and we've tried and [place]on Mac treatment she's tried debridement padding wider shoes without relief    Assessment:     Hammertoe deformity fifth digit fourth digit left foot with keratotic lesion formation and extreme pain    Plan:     X-rays reviewed with patient and at this time I have recommended arthroplasty surgery. I allowed patient to read consent form for arthroplasty digits 4 and 5 of the left foot and derotation of the fifth toe. Patient wants surgery signs consent form after extensive review reviewing alternative treatments and complications. Patient understands total recovery can take 6 months to one year and there is no long-term guarantees and she understands this and is given all preoperative instructions. X-ray report did indicate that there is enlargement of the head of the proximal phalanx digits fourth and fifth toes left with rotation of the fifth toe noted

## 2015-10-23 DIAGNOSIS — M2042 Other hammer toe(s) (acquired), left foot: Secondary | ICD-10-CM | POA: Diagnosis not present

## 2015-10-24 ENCOUNTER — Telehealth: Payer: Self-pay | Admitting: *Deleted

## 2015-10-24 NOTE — Telephone Encounter (Addendum)
Pt states she had surgery yesterday, and was unable to sleep due to the [place], and would like a different pain medication, otherwise everything was okay.  Pt states she takes Meloxicam and Methotrexate and wondered if she could continue those.  Dr. Paulla Dolly states may take those as directed and may add OTC regular strength Tylenol as the package directs rather than add Ibuprofen.  I instructed pt of Dr. Mellody Drown orders and told pt to call with concerns.  Pt states understanding.  Received a call from [place], I spoke with the pharmacist and he said they had no call registered for pt or our office.

## 2015-11-02 ENCOUNTER — Ambulatory Visit (INDEPENDENT_AMBULATORY_CARE_PROVIDER_SITE_OTHER): Payer: Commercial Managed Care - PPO | Admitting: Podiatry

## 2015-11-02 ENCOUNTER — Ambulatory Visit: Payer: Self-pay

## 2015-11-02 VITALS — Temp 98.6°F

## 2015-11-02 DIAGNOSIS — Z9889 Other specified postprocedural states: Secondary | ICD-10-CM

## 2015-11-02 DIAGNOSIS — M2042 Other hammer toe(s) (acquired), left foot: Secondary | ICD-10-CM

## 2015-11-04 NOTE — Progress Notes (Signed)
Subjective:     Patient ID: Samantha Sullivan, female   DOB: 1951-07-22, 64 y.o.   MRN: [id number]  HPI patient states my toes are doing real well in good position with minimal discomfort   Review of Systems     Objective:   Physical Exam Neurovascular status intact digits and good alignment with wound edges coapted well    Assessment:     Doing well post arthroplasty digits 45 left    Plan:     Reviewed x-rays reapplied sterile dressing continued open toed shoes and reappoint for suture removal or earlier if needed

## 2015-11-09 ENCOUNTER — Ambulatory Visit (INDEPENDENT_AMBULATORY_CARE_PROVIDER_SITE_OTHER): Payer: Commercial Managed Care - PPO | Admitting: Podiatry

## 2015-11-09 DIAGNOSIS — M2042 Other hammer toe(s) (acquired), left foot: Secondary | ICD-10-CM

## 2015-11-09 DIAGNOSIS — Z9889 Other specified postprocedural states: Secondary | ICD-10-CM

## 2015-11-10 ENCOUNTER — Other Ambulatory Visit: Payer: Self-pay | Admitting: Nurse Practitioner

## 2015-11-10 NOTE — Progress Notes (Signed)
Subjective:     Patient ID: Samantha Sullivan, female   DOB: 1951/11/20, 64 y.o.   MRN: [id number]  HPI patient presents stating my foot is feeling fine now but I did have some debilitating pain around the toes a few days ago that has disappeared   Review of Systems     Objective:   Physical Exam Neurovascular status intact negative Homans sign was noted with well healing surgical sites fourth and fifth digits left foot. I did not note any indication as to why she had the discomfort she is describing currently and hopefully this will be just a temporary type foot    Assessment:     Doing well post arthroplasty fourth and fifth toes with unusual reaction previously that is not present currently    Plan:     Stitches are removed fourth and fifth digits of the left foot and advised on soaks therapy and if the symptoms should reoccur to let us know immediately. Gradual return soft shoe gear in the next 1-2 weeks

## 2015-11-12 NOTE — Telephone Encounter (Signed)
Medication refill request: Progesterone  Last AEX:  10/31/14 PG Next AEX: 11/21/15 PG Last MMG (if hormonal medication request): 10/26/14 BIRADS2:benign  Refill authorized: 03/28/15 #90caps/2R. Today #90/0R?

## 2015-11-20 ENCOUNTER — Encounter: Payer: Self-pay | Admitting: Nurse Practitioner

## 2015-11-20 ENCOUNTER — Telehealth: Payer: Self-pay | Admitting: Nurse Practitioner

## 2015-11-20 NOTE — Telephone Encounter (Signed)
Patient called to cancel appointment for tomorrow 2015-12-13 due to death in the family. Patient will call back and reschedule, recall in, letter mailed to patient.

## 2015-11-21 ENCOUNTER — Ambulatory Visit: Payer: Commercial Managed Care - PPO | Admitting: Nurse Practitioner

## 2015-11-30 ENCOUNTER — Ambulatory Visit (INDEPENDENT_AMBULATORY_CARE_PROVIDER_SITE_OTHER): Payer: Commercial Managed Care - PPO | Admitting: Podiatry

## 2015-11-30 ENCOUNTER — Ambulatory Visit (INDEPENDENT_AMBULATORY_CARE_PROVIDER_SITE_OTHER): Payer: Commercial Managed Care - PPO

## 2015-11-30 DIAGNOSIS — M2042 Other hammer toe(s) (acquired), left foot: Secondary | ICD-10-CM

## 2015-11-30 DIAGNOSIS — Z9889 Other specified postprocedural states: Secondary | ICD-10-CM | POA: Diagnosis not present

## 2015-12-03 NOTE — Progress Notes (Signed)
Subjective:     Patient ID: Samantha Sullivan, female   DOB: 1951/07/21, 64 y.o.   MRN: [id number]  HPI patient states I'm doing well but having mild swelling   Review of Systems     Objective:   Physical Exam Neurovascular status intact with edema in the left fourth and fifth digits which continues to reduce with incision sites that are healed well with wound edges well coapted    Assessment:     Doing well post arthroplasty 45 left    Plan:     Advised on wider shoes and reviewed x-rays. Reappoint to recheck

## 2015-12-11 ENCOUNTER — Encounter: Payer: Self-pay | Admitting: Nurse Practitioner

## 2015-12-11 ENCOUNTER — Ambulatory Visit (INDEPENDENT_AMBULATORY_CARE_PROVIDER_SITE_OTHER): Payer: Commercial Managed Care - PPO | Admitting: Nurse Practitioner

## 2015-12-11 VITALS — BP 136/82 | HR 64 | Ht 64.25 in | Wt 175.0 lb

## 2015-12-11 DIAGNOSIS — R351 Nocturia: Secondary | ICD-10-CM | POA: Diagnosis not present

## 2015-12-11 DIAGNOSIS — Z0000 Encounter for general adult medical examination without abnormal findings: Secondary | ICD-10-CM

## 2015-12-11 DIAGNOSIS — N6310 Unspecified lump in the right breast, unspecified quadrant: Secondary | ICD-10-CM

## 2015-12-11 DIAGNOSIS — E559 Vitamin D deficiency, unspecified: Secondary | ICD-10-CM | POA: Diagnosis not present

## 2015-12-11 DIAGNOSIS — Z01419 Encounter for gynecological examination (general) (routine) without abnormal findings: Secondary | ICD-10-CM | POA: Diagnosis not present

## 2015-12-11 DIAGNOSIS — N63 Unspecified lump in breast: Secondary | ICD-10-CM | POA: Diagnosis not present

## 2015-12-11 LAB — POCT URINALYSIS DIPSTICK
BILIRUBIN UA: NEGATIVE
GLUCOSE UA: NEGATIVE
Ketones, UA: NEGATIVE
Leukocytes, UA: NEGATIVE
NITRITE UA: NEGATIVE
PH UA: 6
Protein, UA: NEGATIVE
RBC UA: NEGATIVE
Urobilinogen, UA: NEGATIVE

## 2015-12-11 MED ORDER — ZOLPIDEM TARTRATE 5 MG PO TABS: 5.0000 mg | ORAL_TABLET | Freq: Every evening | ORAL | Status: DC | PRN

## 2015-12-11 MED ORDER — ESTRADIOL 0.5 MG PO TABS: 0.5000 mg | ORAL_TABLET | Freq: Every day | ORAL | Status: DC

## 2015-12-11 MED ORDER — PROGESTERONE MICRONIZED 100 MG PO CAPS: 100.0000 mg | ORAL_CAPSULE | Freq: Every day | ORAL | Status: DC

## 2015-12-11 NOTE — Progress Notes (Signed)
Patient ID: Samantha Sullivan, female   DOB: 06/27/1951, 64 y.o.   MRN: [id number]  64 y.o. [id number] Widowed  Caucasian Fe here for annual exam.  She has had a traumatic year.  This November an extended family member whom she has cared for 22 years committed suicide.  She found her at the home and has been traumatized with all that has happened.  She is still trying to settle the estate and pay for the bills.   She has been unable to sleep and request a prn sleep aid.  She also noted a lump in her right breast maybe a few weeks ago.  She is due for a screening mammogram and [facility] is to be calling her back for an apt.  She does wish to continue on HRT -but is at 1/2 -1 tablet daily of the  Estradiol.  Sometimes she will have an increase in vaso symptoms and finds that she needs to go back up in dose for awhile.  Mother still lives in assistant living and is in a wheelchair due to spinal stenosis.  She has a new grand daughter born in October this year.  Patient's last menstrual period was 03/22/2000 (approximate).          Sexually active: No.  The current method of family planning is post menopausal status.    Exercising: Yes.    walking daily, pilates and circuit training once weekly.  Unable to exercise recently due to foot surgery. Smoker:  no  Health Maintenance: Pap: 10/13/12 Negative  MMG:10/26/14, Bi-Rads 2: Benign Findings; in process of scheduling Colonoscopy: 07/2005 negative, repeat in 10 years; in process of scheduling  BMD: 07/28/06, 1.7 Spine / 1.3 Right Femur Total / 1.4 Left Femur Total; pt thinks she has had BMD since this time TDaP: Within 10 years Shingles: Not a candidate due to having shingles and taking Methotrexate Pneumonia: Pt declined Hep C and HIV: pt will think about Labs: Dr. Virgina Jock takes care of all labs   reports that she quit smoking about 34 years ago. She has never used smokeless tobacco. She reports that she does not drink alcohol or use illicit drugs.  Past  Medical History  Diagnosis Date  . Depression   . HSV infection   . Psoriatic arthritis ([place])   . History of meningioma 2007    in 2013 stable on MRI and recheck in 5 years  . Abnormal CT of the chest 12/06    numeroous lung nodules - most have resolved.    Past Surgical History  Procedure Laterality Date  . Thyroidectomy, partial Left 1983  . Breast surgery Left 1971    lumpectomy benign  . Colonoscopy  8/06    normal and recheck in 10 years  . Hammer toe surgery Left 2016    Current Outpatient Prescriptions  Medication Sig Dispense Refill  . EPIPEN 2-PAK 0.3 MG/0.3ML SOAJ injection as needed.    Marland Kitchen estradiol (ESTRACE) 0.5 MG tablet Take 1 tablet (0.5 mg total) by mouth daily. 90 tablet 3  . folic acid (FOLVITE) 1 MG tablet Take 1 tablet by mouth daily.    . meloxicam (MOBIC) 15 MG tablet Take 15 mg by mouth as needed for pain.    . methotrexate (RHEUMATREX) 2.5 MG tablet Take 8 tablets by mouth once a week.    . Multiple Vitamin (MULTIVITAMIN) tablet Take 1 tablet by mouth daily.    . progesterone (PROMETRIUM) 100 MG capsule Take 1 capsule (100 mg total) by  mouth daily. 90 capsule 3  . zolpidem (AMBIEN) 5 MG tablet Take 1 tablet (5 mg total) by mouth at bedtime as needed for sleep. 30 tablet 1   No current facility-administered medications for this visit.    Family History  Problem Relation Age of Onset  . Osteoporosis Mother   . Osteoporosis Sister     ROS:  Pertinent items are noted in HPI.  Otherwise, a comprehensive ROS was negative.  Exam:   BP 136/82 mmHg  Pulse 64  Ht 5' 4.25" (1.632 m)  Wt 175 lb (79.379 kg)  BMI 29.80 kg/m2  LMP 03/22/2000 (Approximate) Height: 5' 4.25" (163.2 cm) Ht Readings from Last 3 Encounters:  12/11/15 5' 4.25" (1.632 m)  10/31/14 5\' 5"  (1.651 m)  10/24/13 5\' 5"  (1.651 m)    General appearance: alert, cooperative and appears stated age Head: Normocephalic, without obvious abnormality, atraumatic Neck: no adenopathy, supple,  symmetrical, trachea midline and thyroid normal to inspection and palpation Lungs: clear to auscultation bilaterally Breasts: normal appearance, no masses or tenderness, except for palpable mass at 9:30 position on the right.  Area is mobile and non tender.  No nipple discharge.  On the left breast at previous biopsy site there remains a fibrous tissue at 7:00 position. Heart: regular rate and rhythm Abdomen: soft, non-tender; no masses,  no organomegaly Extremities: extremities normal, atraumatic, no cyanosis or edema Skin: Skin color, texture, turgor normal. No rashes or lesions Lymph nodes: Cervical, supraclavicular, and axillary nodes normal. No abnormal inguinal nodes palpated Neurologic: Grossly normal   Pelvic: External genitalia:  no lesions              Urethra:  normal appearing urethra with no masses, tenderness or lesions              Bartholin's and Skene's: normal                 Vagina: normal appearing vagina with normal color and discharge, no lesions              Cervix: anteverted              Pap taken: Yes.   Bimanual Exam:  Uterus:  normal size, contour, position, consistency, mobility, non-tender              Adnexa: no mass, fullness, tenderness               Rectovaginal: Confirms               Anus:  normal sphincter tone, no lesions  Chaperone present: yes  A:  Well Woman with normal exam  Postmenopausal on HRT since 04/1999 History of Meningioma brain History of Psoriatic arthritis Vit D deficiency  Recent emotional trauma with suicide of a family member  Now insomnia  P:   Reviewed health and wellness pertinent to exam  Pap smear as above  Will get a diagnostic mammogram and [place] and follow  Will refill HRT - but she is aware that will need to get a normal mammogram if she were to continue.  Counseled on risk of DVT, CVA, cancer, etc  Will follow with labs  Will try her on Ambien 5 mg to use prn only # 30/1  refill  Counseled on breast self exam, mammography screening, use and side effects of HRT, adequate intake of calcium and vitamin D, diet and exercise  return annually or prn  An After Visit Summary was printed and given to  the patient. Will get ROI for immunizations and BMD from PCP

## 2015-12-11 NOTE — Progress Notes (Signed)
Scheduled patient while in office for bilateral diagnostic with right breast ultrasound at [facility] on 12/27/2015 at 8:15 am. Patient is agreeable to date and time.

## 2015-12-11 NOTE — Patient Instructions (Signed)

## 2015-12-12 LAB — VITAMIN D 25 HYDROXY (VIT D DEFICIENCY, FRACTURES): Vit D, 25-Hydroxy: 29 ng/mL — ABNORMAL LOW (ref 30–100)

## 2015-12-12 LAB — HIV ANTIBODY (ROUTINE TESTING W REFLEX): HIV 1&2 Ab, 4th Generation: NONREACTIVE

## 2015-12-12 LAB — HEPATITIS C ANTIBODY: HCV AB: NEGATIVE

## 2015-12-12 NOTE — Progress Notes (Signed)
Encounter reviewed Toyoko Silos, MD   

## 2015-12-13 ENCOUNTER — Telehealth: Payer: Self-pay | Admitting: *Deleted

## 2015-12-13 LAB — IPS PAP SMEAR ONLY

## 2015-12-13 NOTE — Telephone Encounter (Signed)
-----   Message from Kem Boroughs, [place] sent at 12/12/2015  8:15 AM EST ----- Please let patient know that Vit D is a little low at 29 and have her to take OTC Vit D at 1000 IU daily.  The HIV and Hep C was negative as suspected.

## 2015-12-13 NOTE — Telephone Encounter (Signed)
Patient returning call. Please call work number. °

## 2015-12-13 NOTE — Telephone Encounter (Signed)
I have attempted to contact this patient by phone with the following results: left message to return call to [place] at [phone] on answering machine (mobile per [facility]).  Name verified in voicemail, advised message is regarding recent labs.  [phone] (Mobile) *Preferred*

## 2015-12-13 NOTE — Telephone Encounter (Signed)
Pt notified in result note.  Closing encounter. 

## 2015-12-20 ENCOUNTER — Encounter: Payer: Self-pay | Admitting: Podiatry

## 2015-12-20 ENCOUNTER — Ambulatory Visit: Payer: Self-pay

## 2015-12-20 ENCOUNTER — Ambulatory Visit (INDEPENDENT_AMBULATORY_CARE_PROVIDER_SITE_OTHER): Payer: Commercial Managed Care - PPO | Admitting: Podiatry

## 2015-12-20 DIAGNOSIS — Z9889 Other specified postprocedural states: Secondary | ICD-10-CM

## 2015-12-20 DIAGNOSIS — M2042 Other hammer toe(s) (acquired), left foot: Secondary | ICD-10-CM

## 2015-12-20 NOTE — Progress Notes (Signed)
DOS 10-23-15  Hammertoe repair 4th and 5th toes left  Rx'd Vicodin 10/325 #25

## 2015-12-20 NOTE — Progress Notes (Signed)
Subjective:     Patient ID: Samantha Sullivan, female   DOB: [birth year], 64 y.o.   MRN: [id number]  HPI patient states my toes are doing really well on my left foot   Review of Systems     Objective:   Physical Exam   Neurovascular status intact muscle strength adequate with range of motion good with patient noted to have well-healed surgical sites fourth and fifth toes left    Assessment:      doing well post hammertoe repair fourth and fifth toes left foot    Plan:      H&P x-rays reviewed and allow patient to return to normal activity. Patient will be seen back as needed

## 2016-01-02 ENCOUNTER — Other Ambulatory Visit: Payer: Self-pay | Admitting: Radiology

## 2016-01-04 ENCOUNTER — Telehealth: Payer: Self-pay | Admitting: *Deleted

## 2016-01-04 DIAGNOSIS — C50411 Malignant neoplasm of upper-outer quadrant of right female breast: Secondary | ICD-10-CM

## 2016-01-04 DIAGNOSIS — Z170 Estrogen receptor positive status [ER+]: Secondary | ICD-10-CM

## 2016-01-04 NOTE — Telephone Encounter (Signed)
Confirmed [facility] for 01/09/16 at 830am .  Instructions and contact information given.

## 2016-01-07 ENCOUNTER — Telehealth: Payer: Self-pay | Admitting: Nurse Practitioner

## 2016-01-07 ENCOUNTER — Telehealth: Payer: Self-pay | Admitting: *Deleted

## 2016-01-07 NOTE — Telephone Encounter (Signed)
Patient has returned my call and she is told to stop her HRT in view of findings of breast cancer.  She has already done that as of Friday.  She is to meet with her care team and wants Dr. Jana Hakim as her Oncologist.

## 2016-01-07 NOTE — Telephone Encounter (Signed)
Left message for pt. to call back.  She has a new diagnosis of breast cancer and is on HRT.  We need for her to stop med's now.

## 2016-01-07 NOTE — Telephone Encounter (Signed)
Received call from patient stating she prefers to see Dr. Excell Seltzer and Dr.Magrinat in clinic.  Explained that Dr. Jana Hakim is not in clinic this week.  Gave her the option of next week in clinic or seeing the surgeon first.  I was able to get an appointment with Dr. Excell Seltzer for 1/24 and Dr. Jana Hakim on 1/24 so she could at least see these MD's on the same day.  I have cancelled her appointment for clinic.  Encouraged her to call with any needs or concerns.

## 2016-01-07 NOTE — Telephone Encounter (Signed)
Patient returning call.

## 2016-01-09 ENCOUNTER — Ambulatory Visit: Payer: Self-pay | Admitting: Physical Therapy

## 2016-01-09 ENCOUNTER — Ambulatory Visit: Payer: Self-pay | Admitting: Hematology

## 2016-01-09 ENCOUNTER — Other Ambulatory Visit: Payer: Self-pay

## 2016-01-15 ENCOUNTER — Other Ambulatory Visit: Payer: Self-pay | Admitting: General Surgery

## 2016-01-15 ENCOUNTER — Ambulatory Visit (HOSPITAL_BASED_OUTPATIENT_CLINIC_OR_DEPARTMENT_OTHER): Payer: Commercial Managed Care - PPO | Admitting: Oncology

## 2016-01-15 ENCOUNTER — Encounter: Payer: Self-pay | Admitting: *Deleted

## 2016-01-15 ENCOUNTER — Other Ambulatory Visit (HOSPITAL_BASED_OUTPATIENT_CLINIC_OR_DEPARTMENT_OTHER): Payer: Commercial Managed Care - PPO

## 2016-01-15 VITALS — BP 149/85 | HR 69 | Temp 97.1°F | Resp 18 | Ht 64.25 in | Wt 177.1 lb

## 2016-01-15 DIAGNOSIS — L4050 Arthropathic psoriasis, unspecified: Secondary | ICD-10-CM | POA: Insufficient documentation

## 2016-01-15 DIAGNOSIS — C50911 Malignant neoplasm of unspecified site of right female breast: Secondary | ICD-10-CM

## 2016-01-15 DIAGNOSIS — Z170 Estrogen receptor positive status [ER+]: Secondary | ICD-10-CM | POA: Diagnosis not present

## 2016-01-15 DIAGNOSIS — C50411 Malignant neoplasm of upper-outer quadrant of right female breast: Secondary | ICD-10-CM | POA: Diagnosis not present

## 2016-01-15 DIAGNOSIS — D329 Benign neoplasm of meninges, unspecified: Secondary | ICD-10-CM | POA: Insufficient documentation

## 2016-01-15 LAB — CBC WITH DIFFERENTIAL/PLATELET
BASO%: 0.7 % (ref 0.0–2.0)
Basophils Absolute: 0.1 10e3/uL (ref 0.0–0.1)
EOS%: 0.4 % (ref 0.0–7.0)
Eosinophils Absolute: 0 10e3/uL (ref 0.0–0.5)
HCT: 43.4 % (ref 34.8–46.6)
HGB: 14.4 g/dL (ref 11.6–15.9)
LYMPH%: 20.6 % (ref 14.0–49.7)
MCH: 31.4 pg (ref 25.1–34.0)
MCHC: 33.2 g/dL (ref 31.5–36.0)
MCV: 94.7 fL (ref 79.5–101.0)
MONO#: 0.6 10e3/uL (ref 0.1–0.9)
MONO%: 6.2 % (ref 0.0–14.0)
NEUT#: 6.6 10e3/uL — ABNORMAL HIGH (ref 1.5–6.5)
NEUT%: 72.1 % (ref 38.4–76.8)
Platelets: 256 10e3/uL (ref 145–400)
RBC: 4.59 10e6/uL (ref 3.70–5.45)
RDW: 14.3 % (ref 11.2–14.5)
WBC: 9.2 10e3/uL (ref 3.9–10.3)
lymph#: 1.9 10e3/uL (ref 0.9–3.3)

## 2016-01-15 LAB — COMPREHENSIVE METABOLIC PANEL
ALT: 23 U/L (ref 0–55)
ANION GAP: 8 meq/L (ref 3–11)
AST: 26 U/L (ref 5–34)
Albumin: 4.1 g/dL (ref 3.5–5.0)
Alkaline Phosphatase: 147 U/L (ref 40–150)
BUN: 13.4 mg/dL (ref 7.0–26.0)
CALCIUM: 9.5 mg/dL (ref 8.4–10.4)
CHLORIDE: 104 meq/L (ref 98–109)
CO2: 27 meq/L (ref 22–29)
Creatinine: 0.8 mg/dL (ref 0.6–1.1)
EGFR: 74 mL/min/{1.73_m2} — AB (ref >90)
Glucose: 90 mg/dl (ref 70–140)
Potassium: 4.2 mEq/L (ref 3.5–5.1)
Sodium: 139 mEq/L (ref 136–145)
Total Bilirubin: 0.53 mg/dL (ref 0.20–1.20)
Total Protein: 7.1 g/dL (ref 6.4–8.3)

## 2016-01-15 NOTE — Progress Notes (Signed)
[facility]  [phone]) [phone] [phone]     ID: [name] DOB: 1951-06-05  MR#: [id number]  WYO#:[id number]  Patient Care Team: Shon Baton, MD as PCP - General (Internal Medicine) Chauncey Cruel, MD as Consulting Physician (Oncology) Kem Boroughs, FNP as Nurse Practitioner (Family Medicine) Wallene Huh, DPM as Consulting Physician (Podiatry) Excell Seltzer, MD as Consulting Physician (General Surgery) Thea Silversmith, MD as Consulting Physician (Radiation Oncology) PCP: [name], MD OTHER MD:  CHIEF COMPLAINT: Estrogen receptor positive breast cancer  CURRENT TREATMENT: Awaiting definitive surgery   BREAST CANCER HISTORY: "[name]"noted a lump in her right breast sometime in late October or early November, but there were "so many things going on in her life" she did not bring it to medical attention until she saw [name] 12/11/2015. [name] was able to palpate a mass at the 9:30 o'clock position in the right breast, which was mobile and nontender. The left breast is status post prior remote biopsy and there was some scar tissue at the 7:00 position. The patient was set up for bilateral diagnostic Mammography with tomosynthesis at [facility] 12/27/2015. This found the breast density to be category C. There was a new oval mass in the right breast upper outer quadrant. Ultrasound the same day confirmed a 2.2 cm mass with irregular margins in the right breast at the 9:00 position read as highly suggestive of malignancy.  Biopsy of the right breast mass in question 01/02/2016 found ([id number]) an invasive ductal carcinoma, grade 1, estrogen receptor 100% positive, progesterone receptor 90% positive, both with strong staining intensity, with an MIB-1 of 10%, and no HER-2 amplification, the signals ratio being 1.32 and the number per cell 2.05. The patient was informed and instructed to stop hormone replacement therapy (last dose  01/04/2016).  Her subsequent history is as detailed below.   INTERVAL HISTORY: Samantha Sullivan was evaluated in the breast clinic 01/15/2016 accompanied by her friend Madlyn Frankel. Her case was also presented in the multidisciplinary breast cancer conference 01/09/2016. At that time a preliminary plan was proposed: Breast conserving surgery with sentinel lymph node sampling, followed by Oncotype, adjuvant radiation, and adjuvant hormone therapy.  REVIEW OF SYSTEMS:  aside from the mass itself, there were no specific symptoms leading to the original mammogram, which was routinely scheduled. The patient denies unusual headaches, visual changes, nausea, vomiting, stiff neck, dizziness, or gait imbalance. There has been no cough, phlegm production, or pleurisy, no chest pain or pressure, and no change in bowel or bladder habits. The patient denies fever, rash, bleeding, unexplained fatigue or unexplained weight loss. Her psoriatic arthritis is treated with methotrexate and well-controlled. She is tolerating the methotrexate with no respiratory problems and her liver function tests are closely followed by Dr. Amil Amen. Her recent hammertoe surgery, which is related, went well. Cathy exercises by going to the gym twice a week and otherwise walking for about an hour most days. A detailed review of systems was otherwise entirely negative.  PAST MEDICAL HISTORY: Past Medical History  Diagnosis Date  . Depression   . HSV infection   . Psoriatic arthritis ([place])   . History of meningioma 2007    in 2013 stable on MRI and recheck in 5 years  . Abnormal CT of the chest 12/06    numeroous lung nodules - most have resolved.    PAST SURGICAL HISTORY: Past Surgical History  Procedure Laterality Date  . Thyroidectomy, partial Left 1983  . Breast surgery Left 1971  lumpectomy benign  . Colonoscopy  8/06    normal and recheck in 10 years  . Hammer toe surgery Left 2016    FAMILY HISTORY Family History  Problem  Relation Age of Onset  . Osteoporosis Mother   . Osteoporosis Sister   Samantha Sullivan has essentially no information on her father's side of the family aside from the fact that he died in his 58s from heart disease. He had psoriasis. The patient's mother is living as of January 2017, age 68. The patient had 2 brothers who died from congenital heart disease shortly after birth. The patient has one sister. The patient's mother had one sister, the patient's aunt, diagnosed with ovarian cancer in her 74s.  GYNECOLOGIC HISTORY:  Patient's last menstrual period was 03/22/2000 (approximate). Menarche age 58, first live birth age 69. The patient is GX P2. She went through menopause approximately 1990 but was taking hormone replacement until 01/04/2016. Since coming off replacement she has had insomnia but no hot flashes or vaginal dryness pleural bones so 4. She did take oral contraceptives remotely for more than 10 years with no complications  SOCIAL HISTORY:  Samantha Sullivan works as a Statistician. She is divorced and lives alone, with no pets. Her daughter [name] lives in [place]. She is disabled secondary to schizoaffective disorder. Daughter [name] lives in [place] and she is a Estate agent but mostly a homemaker. The patient has 3 grandchildren, the older is being 51, the other 2 being under 20 years old. The patient attends [employer] DIRECTIVES: this is being revised, but Samantha Sullivan is intending to name her daughter [name] as her healthcare power of attorney. She can be reached at [place]: Social History  Substance Use Topics  . Smoking status: Former Smoker -- 1.00 packs/day for 12 years    Quit date: 07/23/1981  . Smokeless tobacco: Never Used  . Alcohol Use: No     Colonoscopy: due 2017; [facility]  ZOX:WRUEAVW 2016/[facility]  Bone density:remote  Lipid panel:  Allergies  Allergen Reactions  . Bee Venom    . Norco [Hydrocodone-Acetaminophen] Other (See Comments)    Pt states Norco made her extremely hyper after her surgery and she was not able to sleep.    Current Outpatient Prescriptions  Medication Sig Dispense Refill  . EPIPEN 2-PAK 0.3 MG/0.3ML SOAJ injection as needed.    . folic acid (FOLVITE) 1 MG tablet Take 1 tablet by mouth daily.    . meloxicam (MOBIC) 15 MG tablet Take 15 mg by mouth as needed for pain.    Marland Kitchen zolpidem (AMBIEN) 5 MG tablet Take 1 tablet (5 mg total) by mouth at bedtime as needed for sleep. 30 tablet 1   No current facility-administered medications for this visit.    OBJECTIVE: middle-aged white woman who appears stated age 33 Vitals:   01/15/16 1608  BP: 149/85  Pulse: 69  Temp: 97.1 F (36.2 C)  Resp: 18     Body mass index is 30.16 kg/(m^2).    ECOG FS:0 - Asymptomatic  Ocular: Sclerae unicteric, pupils equal, round and reactive to light Ear-nose-throat: Oropharynx clear and moist Lymphatic: No cervical or supraclavicular adenopathy Lungs no rales or rhonchi, good excursion bilaterally Heart regular rate and rhythm, no murmur appreciated Abd soft, nontender, positive bowel sounds MSK no focal spinal tenderness, no joint edema Neuro: non-focal, well-oriented, appropriate affect Breasts: I do not palpate a well-defined mass in the upper outer  quadrant of the right breast. This area status post recent biopsy. There are no skin or nipple changes of concern. The right axilla is benign. The left breast is status post remote benign lumpectomy. It is otherwise unremarkable. The left axilla is benign.   LAB RESULTS:  CMP     Component Value Date/Time   NA 139 01/15/2016 1554   K 4.2 01/15/2016 1554   CO2 27 01/15/2016 1554   GLUCOSE 90 01/15/2016 1554   BUN 13.4 01/15/2016 1554   CREATININE 0.8 01/15/2016 1554   CALCIUM 9.5 01/15/2016 1554   PROT 7.1 01/15/2016 1554   ALBUMIN 4.1 01/15/2016 1554   AST 26 01/15/2016 1554   ALT 23 01/15/2016  1554   ALKPHOS 147 01/15/2016 1554   BILITOT 0.53 01/15/2016 1554    INo results found for: SPEP, UPEP  Lab Results  Component Value Date   WBC 9.2 01/15/2016   NEUTROABS 6.6* 01/15/2016   HGB 14.4 01/15/2016   HCT 43.4 01/15/2016   MCV 94.7 01/15/2016   PLT 256 01/15/2016      Chemistry      Component Value Date/Time   NA 139 01/15/2016 1554   K 4.2 01/15/2016 1554   CO2 27 01/15/2016 1554   BUN 13.4 01/15/2016 1554   CREATININE 0.8 01/15/2016 1554      Component Value Date/Time   CALCIUM 9.5 01/15/2016 1554   ALKPHOS 147 01/15/2016 1554   AST 26 01/15/2016 1554   ALT 23 01/15/2016 1554   BILITOT 0.53 01/15/2016 1554       No results found for: LABCA2  No components found for: LABCA125  No results for input(s): INR in the last 168 hours.  Urinalysis    Component Value Date/Time   BILIRUBINUR neg 12/11/2015 0925   PROTEINUR neg 12/11/2015 0925   UROBILINOGEN negative 12/11/2015 0925   NITRITE neg 12/11/2015 0925   LEUKOCYTESUR Negative 12/11/2015 0925    STUDIES: Outside studies reviewed  ASSESSMENT: 65 y.o. [place] woman Status post right breast upper outer quadrant biopsy 01/02/2016 for a clinical T2 N0, stage IIA invasive ductal carcinoma, grade 1, estrogen and progesterone receptor positive, HER-2 not amplified, with an MIB-1 of 10%.  (1) breast conserving surgery with sentinel lymph node sampling pending  (2) Oncotype DX will be requested from the final pathology specimen  (3) radiation will follow chemotherapy or surgery depending on Oncotype results  (4) anti-estrogens will follow radiation  (5) genetics testing pending  PLAN: We spent the better part of today's hour-long appointment discussing the biology of breast cancer in general, and the specifics of the patient's tumor in particular. We discussed the difference between local and systemic treatment. Samantha Sullivan understands that there is no survival advantage to mastectomy as compared to  lumpectomy and sentinel lymph node sampling and she is very comfortable with this choice.   As far as systemic therapy is concerned, she will be an excellent candidate for anti-estrogens, which will cut in half her risk of this breast cancer recurring either locally or systemically, and also cut in half her risk of developing a new breast cancer. She would get no benefit from anti-HER-2 immunotherapy.  The benefit from chemotherapy is likely to be marginal in this slow-growing not aggressive looking stage II a breast cancer. The actual margin of benefit is best quantitated with an Oncotype test and this will be requested. Samantha Sullivan understands it takes about 2 weeks to get those results and sometimes we are surprised and a cancer we expected  to be low risk turns out to be the opposite. Accordingly the final decision regarding chemotherapy is not made until those numbers RN  The general sequence then is surgery followed by chemotherapy if chemotherapy is given, followed by radiation, followed by anti-estrogens.  She believes her grandmother had ovarian cancer. She also has no information on her father's side of the family. I believe either of those would qualify her for genetics testing and we will place that referral today.  My expectation is she will likely have surgery before mid February. Accordingly I plan to see her late February or early March to discuss the Oncotype results.  Samantha Sullivan has a good understanding of the overall plan. She agrees with it. She knows the goal of treatment in her case is cure. She will call with any problems that may develop before her next visit here.  Chauncey Cruel, MD   01/15/2016 5:35 PM Medical Oncology and Hematology [facility] [address] [place], [place] [address] Tel. [phone]    Fax. [phone]

## 2016-01-16 ENCOUNTER — Telehealth: Payer: Self-pay | Admitting: Oncology

## 2016-01-16 NOTE — Telephone Encounter (Signed)
Aware of her appointments and rad onc will call her

## 2016-01-18 ENCOUNTER — Encounter: Payer: Self-pay | Admitting: Radiation Oncology

## 2016-01-18 ENCOUNTER — Ambulatory Visit
Admission: RE | Admit: 2016-01-18 | Discharge: 2016-01-18 | Disposition: A | Discharge status: Home / Self Care | Payer: Commercial Managed Care - PPO | Source: Ambulatory Visit | Attending: Radiation Oncology | Admitting: Radiation Oncology

## 2016-01-18 ENCOUNTER — Encounter (HOSPITAL_BASED_OUTPATIENT_CLINIC_OR_DEPARTMENT_OTHER): Payer: Self-pay | Admitting: *Deleted

## 2016-01-18 VITALS — BP 120/75 | HR 73 | Temp 98.1°F | Ht 64.5 in | Wt 176.9 lb

## 2016-01-18 DIAGNOSIS — Z510 Encounter for antineoplastic radiation therapy: Secondary | ICD-10-CM | POA: Insufficient documentation

## 2016-01-18 DIAGNOSIS — Z170 Estrogen receptor positive status [ER+]: Secondary | ICD-10-CM | POA: Insufficient documentation

## 2016-01-18 DIAGNOSIS — C50411 Malignant neoplasm of upper-outer quadrant of right female breast: Secondary | ICD-10-CM | POA: Diagnosis present

## 2016-01-18 NOTE — Progress Notes (Signed)
Radiation Oncology         [phone]) [phone] ________________________________  Initial Outpatient Consultation - Date: 01/18/2016   Name: Samantha Sullivan MRN: [id number]   DOB: [birth year]  REFERRING PHYSICIAN: Magrinat, Samantha Dad, MD  DIAGNOSIS AND STAGE: Breast cancer of upper-outer quadrant of right female breast Samantha Sullivan)   Staging form: Breast, AJCC 7th Edition     Clinical: Stage IIA (T2, N0, M0) - Signed by Samantha Cruel, MD on 01/15/2016  HISTORY OF PRESENT ILLNESS::Samantha Sullivan is a 65 y.o. female who self palpated a right breast lump in either late October or early November. However, due to "so many things going on in her life," she did not bring it to medical attention until she saw Samantha Circle, NP on 12/11/2015. Ms Samantha Sullivan was able to palpate a mass at the 9:30 o'clock position in the right breast, which was mobile and nontender. The patient was set up for bilateral diagnostic mammography with tomosynthesis at [facility] on 12/27/2015. This found the breast density to be category C. There was a new oval mass in the right breast upper outer quadrant. Ultrasound the same day confirmed a 2.2 cm mass with irregular margins in the right breast at the 9:00 position read as highly suggestive of malignancy.  Biopsy of the right breast mass in question, on 01/02/2016, found a grade 1 invasive ductal carcinoma, ER/PR positive, both with strong staining intensity, with an MIB-1 of 10%, and no HER-2 amplification, the signals ratio being 1.32 and the number per cell 2.05. The patient was informed and instructed to stop hormone replacement therapy (last dose was on 01/04/2016). The patient was on methotrexate, for arthritis, and last took it on 01/11/16.  The patient was evaluated in the breast clinic 01/15/2016. Her case was also presented in the multidisciplinary breast cancer conference 01/09/2016. At that time, a preliminary plan was proposed involving breast conservation surgery with sentinel lymph node  sampling, followed by Oncotype DX which would determine if she needs chemotherapy, adjuvant radiation, and adjuvant hormone therapy. The patient presents to me to discuss radiation for the management of her disease.  She reports having a left lumpectomy in 1971 for a benign process. She lost a friend to suicide in October and has not taken time to grieve for this.  PREVIOUS RADIATION THERAPY: No  Past medical, social and family history were reviewed in the electronic chart. Review of symptoms was reviewed in the electronic chart. Medications were reviewed in the electronic chart.   PHYSICAL EXAM:  Filed Vitals:   01/18/16 1422  BP: 120/75  Pulse: 73  Temp: 98.1 F (36.7 C)  .176 lb 14.4 oz (80.241 kg). I did not palpate a mass in the right breast. No palpable cervical, supraclavicular, or axillary adenopathy. Her left breast has a lumpectomy scar inferior to the nipple which is well healed.  IMPRESSION: Clinical stage IIA (T2N0) invasive ductal carcinoma of the right breast. The patient is a good candidate for radiation therapy.  PLAN: I spoke to the patient today regarding her diagnosis and options for treatment. We discussed the equivalence in terms of survival and local failure between mastectomy and breast conservation. We discussed the role of radiation in decreasing local failures in patients who undergo lumpectomy. We discussed the process of CT simulation and the placement tattoos. We discussed 4-6 weeks of treatment as an outpatient. We discussed the possibility of asymptomatic lung damage. We discussed the low likelihood of secondary malignancies. We discussed the possible side effects including but not  limited to skin redness, fatigue, permanent skin darkening, and breast swelling. We discussed the process of simulation and the placement of tattoos. I will see her back after her Oncotype score. I did clarify with her that if she needed chemotherapy this would be performed prior to  radiation. She met with medical oncology as well as a member of our patient family support team and our physical therapist. I will plan on seeing her back after her surgery.  She is scheduled for surgery next Tuesday and genetic counseling is scheduled on 01/30/16.  She is contemplating taking time off to complete radiation and I said this would be fine and she could go on temporary disability until her radiation is complete.  I spent 40 minutes  face to face with the patient and more than 50% of that time was spent in counseling and/or coordination of care.   ------------------------------------------------  Samantha Silversmith, MD  This document serves as a record of services personally performed by Samantha Silversmith, MD. It was created on her behalf by Samantha Sullivan, a trained medical scribe. The creation of this record is based on the scribe's personal observations and the provider's statements to them. This document has been checked and approved by the attending provider.

## 2016-01-18 NOTE — Addendum Note (Signed)
Encounter addended by: Benn Moulder, RN on: 01/18/2016  5:33 PM<BR>     Documentation filed: Charges VN

## 2016-01-18 NOTE — Progress Notes (Signed)
Location of Breast Cancer:Right Breast Cancer  Histology per Pathology Report: 01-02-2016 Diagnosis  Breast, right, needle core biopsy - INVASIVE DUCTAL CARCINOMA WITH ASSOCIATED CALCIFICATIONS. - DUCTAL CARCINOMA IN SITU. - SEE COMMENT. Receptor Status: ER(100%), PR (90%), Her2-neu (neg), Ki-67 (10%)   Samantha Sullivan noted a lump in her right breast sometime in late October or early November, but there were "so many things going on in her life" she did not bring it to medical attention until she saw [name] 12/11/2015. [name] was able to palpate a mass at the 9:30 o'clock position in the right breast, which was mobile and non tender.  Past/Anticipated interventions by surgeon, Dr. Excell Seltzer did a biopsy of the right breast mass in question.   Past/Anticipated interventions by medical oncology, if any:Dr3) Dr. Heywood Iles radiation will follow chemotherapy or surgery depending on Oncotype results. Lurline Del asked [name] informed and instructed Mrs. [name] to stop hormone replacement therapy (last dose 01/04/2016).  Lymphedema issues, if any: No  Pain issues, if any:No  SAFETY ISSUES:  Prior radiation? No  Pacemaker/ICD? No  Possible current pregnancy?No  Is the patient on methotrexate?[name] taken on 01-11-16 having surgery 01-22-16 Current Complaints / other details:    Patient's last menstrual period was 03/22/2000 (approximate). Menarche age 48, first live birth age 57. The patient is GX P2. She went through menopause approximately 1990 but was taking hormone replacement until 01/03/2016. Since coming off replacement she has had insomnia but no hot flashes or vaginal dryness pleural bones so 4. She did take oral contraceptives remotely for more than 10 years with no complications BP 597/41 mmHg  Pulse 73  Temp(Src) 98.1 F (36.7 C) (Oral)  Ht 5' 4.5" (1.638 m)  Wt 176 lb 14.4 oz (80.241 kg)  BMI 29.91 kg/m2  SpO2 99%  LMP 03/22/2000 (Approximate)   Wt Readings from Last 3 Encounters:  01/18/16 176 lb 14.4 oz (80.241 kg)  01/18/16 175 lb (79.379 kg)  01/15/16 177 lb 1.6 oz (80.332 kg)    [name], RN 01/18/2016,10:01 AM

## 2016-01-22 ENCOUNTER — Encounter (HOSPITAL_BASED_OUTPATIENT_CLINIC_OR_DEPARTMENT_OTHER)
Admission: RE | Disposition: A | Discharge status: Home / Self Care | Payer: Self-pay | Source: Ambulatory Visit | Attending: General Surgery

## 2016-01-22 ENCOUNTER — Encounter (HOSPITAL_BASED_OUTPATIENT_CLINIC_OR_DEPARTMENT_OTHER): Payer: Self-pay | Admitting: Anesthesiology

## 2016-01-22 ENCOUNTER — Encounter (HOSPITAL_COMMUNITY)
Admission: RE | Admit: 2016-01-22 | Discharge: 2016-01-22 | Disposition: A | Discharge status: Home / Self Care | Payer: Commercial Managed Care - PPO | Source: Ambulatory Visit | Attending: General Surgery | Admitting: General Surgery

## 2016-01-22 ENCOUNTER — Ambulatory Visit (HOSPITAL_BASED_OUTPATIENT_CLINIC_OR_DEPARTMENT_OTHER)
Admission: RE | Admit: 2016-01-22 | Discharge: 2016-01-22 | Disposition: A | Discharge status: Home / Self Care | Payer: Commercial Managed Care - PPO | Source: Ambulatory Visit | Attending: General Surgery | Admitting: General Surgery

## 2016-01-22 ENCOUNTER — Ambulatory Visit (HOSPITAL_BASED_OUTPATIENT_CLINIC_OR_DEPARTMENT_OTHER): Payer: Commercial Managed Care - PPO | Admitting: Anesthesiology

## 2016-01-22 DIAGNOSIS — C50411 Malignant neoplasm of upper-outer quadrant of right female breast: Secondary | ICD-10-CM | POA: Diagnosis not present

## 2016-01-22 DIAGNOSIS — C50911 Malignant neoplasm of unspecified site of right female breast: Secondary | ICD-10-CM

## 2016-01-22 DIAGNOSIS — Z87891 Personal history of nicotine dependence: Secondary | ICD-10-CM | POA: Insufficient documentation

## 2016-01-22 DIAGNOSIS — Z170 Estrogen receptor positive status [ER+]: Secondary | ICD-10-CM | POA: Insufficient documentation

## 2016-01-22 DIAGNOSIS — M1990 Unspecified osteoarthritis, unspecified site: Secondary | ICD-10-CM | POA: Insufficient documentation

## 2016-01-22 DIAGNOSIS — Z79899 Other long term (current) drug therapy: Secondary | ICD-10-CM | POA: Insufficient documentation

## 2016-01-22 HISTORY — DX: Unspecified osteoarthritis, unspecified site: M19.90

## 2016-01-22 HISTORY — PX: BREAST LUMPECTOMY WITH RADIOACTIVE SEED AND SENTINEL LYMPH NODE BIOPSY: SHX6550

## 2016-01-22 HISTORY — DX: Malignant (primary) neoplasm, unspecified: C80.1

## 2016-01-22 SURGERY — BREAST LUMPECTOMY WITH RADIOACTIVE SEED AND SENTINEL LYMPH NODE BIOPSY
Anesthesia: Regional | Site: Breast | Laterality: Right

## 2016-01-22 MED ORDER — DEXAMETHASONE SODIUM PHOSPHATE 10 MG/ML IJ SOLN
INTRAMUSCULAR | Status: AC
  Filled 2016-01-22: qty 1

## 2016-01-22 MED ORDER — CHLORHEXIDINE GLUCONATE 4 % EX LIQD: 1.0000 "application " | Freq: Once | CUTANEOUS | Status: DC

## 2016-01-22 MED ORDER — CEFAZOLIN SODIUM-DEXTROSE 2-3 GM-% IV SOLR
2.0000 g | INTRAVENOUS | Status: AC
  Administered 2016-01-22: 2 g via INTRAVENOUS

## 2016-01-22 MED ORDER — OXYCODONE HCL 5 MG PO TABS: 5.0000 mg | ORAL_TABLET | Freq: Four times a day (QID) | ORAL | Status: DC | PRN

## 2016-01-22 MED ORDER — BUPIVACAINE-EPINEPHRINE (PF) 0.5% -1:200000 IJ SOLN
INTRAMUSCULAR | Status: DC | PRN
  Administered 2016-01-22: 6 mL

## 2016-01-22 MED ORDER — MIDAZOLAM HCL 2 MG/2ML IJ SOLN
INTRAMUSCULAR | Status: AC
  Filled 2016-01-22: qty 2

## 2016-01-22 MED ORDER — LACTATED RINGERS IV SOLN: INTRAVENOUS | Status: DC

## 2016-01-22 MED ORDER — MEPERIDINE HCL 25 MG/ML IJ SOLN: 6.2500 mg | INTRAMUSCULAR | Status: DC | PRN

## 2016-01-22 MED ORDER — GLYCOPYRROLATE 0.2 MG/ML IJ SOLN
0.2000 mg | Freq: Once | INTRAMUSCULAR | Status: AC | PRN
  Administered 2016-01-22: 0.2 mg via INTRAVENOUS

## 2016-01-22 MED ORDER — SODIUM CHLORIDE 0.9 % IJ SOLN
INTRAMUSCULAR | Status: DC | PRN
  Administered 2016-01-22: 5 mL

## 2016-01-22 MED ORDER — MIDAZOLAM HCL 5 MG/5ML IJ SOLN
INTRAMUSCULAR | Status: DC | PRN
  Administered 2016-01-22: 2 mg via INTRAVENOUS

## 2016-01-22 MED ORDER — FENTANYL CITRATE (PF) 100 MCG/2ML IJ SOLN
INTRAMUSCULAR | Status: DC | PRN
  Administered 2016-01-22: 50 ug via INTRAVENOUS
  Administered 2016-01-22: 100 ug via INTRAVENOUS
  Administered 2016-01-22: 50 ug via INTRAVENOUS

## 2016-01-22 MED ORDER — SODIUM CHLORIDE 0.9 % IJ SOLN
INTRAMUSCULAR | Status: AC
  Filled 2016-01-22: qty 10

## 2016-01-22 MED ORDER — LIDOCAINE HCL (CARDIAC) 20 MG/ML IV SOLN
INTRAVENOUS | Status: AC
  Filled 2016-01-22: qty 5

## 2016-01-22 MED ORDER — KETOROLAC TROMETHAMINE 30 MG/ML IJ SOLN
INTRAMUSCULAR | Status: AC
  Filled 2016-01-22: qty 1

## 2016-01-22 MED ORDER — LACTATED RINGERS IV SOLN
INTRAVENOUS | Status: DC
  Administered 2016-01-22 (×2): via INTRAVENOUS

## 2016-01-22 MED ORDER — FENTANYL CITRATE (PF) 100 MCG/2ML IJ SOLN
INTRAMUSCULAR | Status: AC
  Filled 2016-01-22: qty 2

## 2016-01-22 MED ORDER — KETOROLAC TROMETHAMINE 30 MG/ML IJ SOLN
INTRAMUSCULAR | Status: DC | PRN
  Administered 2016-01-22: 30 mg via INTRAVENOUS

## 2016-01-22 MED ORDER — ONDANSETRON HCL 4 MG/2ML IJ SOLN
INTRAMUSCULAR | Status: DC | PRN
  Administered 2016-01-22: 4 mg via INTRAVENOUS

## 2016-01-22 MED ORDER — HYDROMORPHONE HCL 1 MG/ML IJ SOLN: 0.2500 mg | INTRAMUSCULAR | Status: DC | PRN

## 2016-01-22 MED ORDER — PROPOFOL 10 MG/ML IV BOLUS
INTRAVENOUS | Status: DC | PRN
  Administered 2016-01-22: 180 mg via INTRAVENOUS
  Administered 2016-01-22: 20 mg via INTRAVENOUS

## 2016-01-22 MED ORDER — FENTANYL CITRATE (PF) 100 MCG/2ML IJ SOLN
50.0000 ug | INTRAMUSCULAR | Status: DC | PRN
  Administered 2016-01-22: 100 ug via INTRAVENOUS

## 2016-01-22 MED ORDER — LIDOCAINE HCL (CARDIAC) 20 MG/ML IV SOLN
INTRAVENOUS | Status: DC | PRN
  Administered 2016-01-22: 50 mg via INTRAVENOUS

## 2016-01-22 MED ORDER — BUPIVACAINE-EPINEPHRINE (PF) 0.5% -1:200000 IJ SOLN
INTRAMUSCULAR | Status: DC | PRN
  Administered 2016-01-22: 20 mL

## 2016-01-22 MED ORDER — SCOPOLAMINE 1 MG/3DAYS TD PT72: 1.0000 | MEDICATED_PATCH | Freq: Once | TRANSDERMAL | Status: DC | PRN

## 2016-01-22 MED ORDER — GLYCOPYRROLATE 0.2 MG/ML IJ SOLN
INTRAMUSCULAR | Status: AC
  Filled 2016-01-22: qty 1

## 2016-01-22 MED ORDER — CEFAZOLIN SODIUM-DEXTROSE 2-3 GM-% IV SOLR
INTRAVENOUS | Status: AC
  Filled 2016-01-22: qty 50

## 2016-01-22 MED ORDER — PROMETHAZINE HCL 25 MG/ML IJ SOLN: 6.2500 mg | INTRAMUSCULAR | Status: DC | PRN

## 2016-01-22 MED ORDER — MIDAZOLAM HCL 2 MG/2ML IJ SOLN
1.0000 mg | INTRAMUSCULAR | Status: DC | PRN
  Administered 2016-01-22: 2 mg via INTRAVENOUS

## 2016-01-22 MED ORDER — TECHNETIUM TC 99M SULFUR COLLOID FILTERED
1.0000 | Freq: Once | INTRAVENOUS | Status: AC | PRN
  Administered 2016-01-22: 1 via INTRADERMAL

## 2016-01-22 MED ORDER — ONDANSETRON HCL 4 MG/2ML IJ SOLN
INTRAMUSCULAR | Status: AC
  Filled 2016-01-22: qty 2

## 2016-01-22 MED ORDER — PROPOFOL 10 MG/ML IV BOLUS
INTRAVENOUS | Status: AC
  Filled 2016-01-22: qty 40

## 2016-01-22 MED ORDER — DEXAMETHASONE SODIUM PHOSPHATE 4 MG/ML IJ SOLN
INTRAMUSCULAR | Status: DC | PRN
  Administered 2016-01-22: 10 mg via INTRAVENOUS

## 2016-01-22 MED ORDER — METHYLENE BLUE 1 % INJ SOLN
INTRAMUSCULAR | Status: AC
  Filled 2016-01-22: qty 10

## 2016-01-22 SURGICAL SUPPLY — 52 items
APPLIER CLIP 9.375 MED OPEN (MISCELLANEOUS) ×2
APR CLP MED 9.3 20 MLT OPN (MISCELLANEOUS) ×1
BINDER BREAST LRG (GAUZE/BANDAGES/DRESSINGS) ×1 IMPLANT
BINDER BREAST MEDIUM (GAUZE/BANDAGES/DRESSINGS) IMPLANT
BINDER BREAST XLRG (GAUZE/BANDAGES/DRESSINGS) IMPLANT
BINDER BREAST XXLRG (GAUZE/BANDAGES/DRESSINGS) IMPLANT
BLADE SURG 15 STRL LF DISP TIS (BLADE) ×1 IMPLANT
BLADE SURG 15 STRL SS (BLADE) ×2
CANISTER SUC SOCK COL 7IN (MISCELLANEOUS) IMPLANT
CANISTER SUCT 1200ML W/VALVE (MISCELLANEOUS) ×1 IMPLANT
CHLORAPREP W/TINT 26ML (MISCELLANEOUS) ×2 IMPLANT
CLIP APPLIE 9.375 MED OPEN (MISCELLANEOUS) ×1 IMPLANT
COVER BACK TABLE 60X90IN (DRAPES) ×2 IMPLANT
COVER MAYO STAND STRL (DRAPES) ×2 IMPLANT
COVER PROBE W GEL 5X96 (DRAPES) ×2 IMPLANT
DECANTER SPIKE VIAL GLASS SM (MISCELLANEOUS) IMPLANT
DEVICE DUBIN W/COMP PLATE 8390 (MISCELLANEOUS) ×2 IMPLANT
DRAPE LAPAROSCOPIC ABDOMINAL (DRAPES) ×2 IMPLANT
DRAPE UTILITY XL STRL (DRAPES) ×2 IMPLANT
ELECT COATED BLADE 2.86 ST (ELECTRODE) ×2 IMPLANT
ELECT REM PT RETURN 9FT ADLT (ELECTROSURGICAL) ×2
ELECTRODE REM PT RTRN 9FT ADLT (ELECTROSURGICAL) ×1 IMPLANT
GLOVE BIOGEL PI IND STRL 7.0 (GLOVE) IMPLANT
GLOVE BIOGEL PI IND STRL 8 (GLOVE) ×1 IMPLANT
GLOVE BIOGEL PI INDICATOR 7.0 (GLOVE) ×1
GLOVE BIOGEL PI INDICATOR 8 (GLOVE) ×1
GLOVE ECLIPSE 6.5 STRL STRAW (GLOVE) ×1 IMPLANT
GLOVE ECLIPSE 7.5 STRL STRAW (GLOVE) ×2 IMPLANT
GLOVE EXAM NITRILE EXT CUFF MD (GLOVE) ×1 IMPLANT
GOWN STRL REUS W/ TWL LRG LVL3 (GOWN DISPOSABLE) ×1 IMPLANT
GOWN STRL REUS W/ TWL XL LVL3 (GOWN DISPOSABLE) ×1 IMPLANT
GOWN STRL REUS W/TWL LRG LVL3 (GOWN DISPOSABLE) ×2
GOWN STRL REUS W/TWL XL LVL3 (GOWN DISPOSABLE) ×2
ILLUMINATOR WAVEGUIDE N/F (MISCELLANEOUS) ×1 IMPLANT
KIT MARKER MARGIN INK (KITS) ×2 IMPLANT
LIQUID BAND (GAUZE/BANDAGES/DRESSINGS) ×2 IMPLANT
NDL HYPO 25X1 1.5 SAFETY (NEEDLE) ×2 IMPLANT
NDL SAFETY ECLIPSE 18X1.5 (NEEDLE) ×1 IMPLANT
NEEDLE HYPO 18GX1.5 SHARP (NEEDLE) ×2
NEEDLE HYPO 25X1 1.5 SAFETY (NEEDLE) ×4 IMPLANT
NS IRRIG 1000ML POUR BTL (IV SOLUTION) ×1 IMPLANT
PACK BASIN DAY SURGERY FS (CUSTOM PROCEDURE TRAY) ×2 IMPLANT
PENCIL BUTTON HOLSTER BLD 10FT (ELECTRODE) ×2 IMPLANT
SLEEVE SCD COMPRESS KNEE MED (MISCELLANEOUS) ×2 IMPLANT
SPONGE LAP 4X18 X RAY DECT (DISPOSABLE) ×3 IMPLANT
SUT MON AB 5-0 PS2 18 (SUTURE) ×3 IMPLANT
SUT VICRYL 3-0 CR8 SH (SUTURE) ×3 IMPLANT
SYR CONTROL 10ML LL (SYRINGE) ×4 IMPLANT
TOWEL OR 17X24 6PK STRL BLUE (TOWEL DISPOSABLE) ×2 IMPLANT
TOWEL OR NON WOVEN STRL DISP B (DISPOSABLE) ×1 IMPLANT
TUBE CONNECTING 20X1/4 (TUBING) ×1 IMPLANT
YANKAUER SUCT BULB TIP NO VENT (SUCTIONS) ×1 IMPLANT

## 2016-01-22 NOTE — Anesthesia Procedure Notes (Addendum)
Anesthesia Regional Block:  Pectoralis block  Pre-Anesthetic Checklist: ,, timeout performed, Correct Patient, Correct Site, Correct Laterality, Correct Procedure, Correct Position, site marked, Risks and benefits discussed,  Surgical consent,  Pre-op evaluation,  At surgeon's request and post-op pain management  Laterality: Right  Prep: chloraprep       Needles:  Injection technique: Single-shot  Needle Type: Echogenic Needle     Needle Length: 9cm 9 cm Needle Gauge: 21 and 21 G    Additional Needles:  Procedures: ultrasound guided (picture in chart) Pectoralis block Narrative:  Start time: 01/22/2016 9:37 AM End time: 01/22/2016 9:39 AM Injection made incrementally with aspirations every 5 mL.  Performed by: Personally  Anesthesiologist: Suella Broad D  Additional Notes: No immediate complications noted.    Procedure Name: LMA Insertion Date/Time: 01/22/2016 10:18 AM Performed by: Toula Moos L Pre-anesthesia Checklist: Patient identified, Emergency Drugs available, Suction available, Patient being monitored and Timeout performed Patient Re-evaluated:Patient Re-evaluated prior to inductionOxygen Delivery Method: Circle System Utilized Preoxygenation: Pre-oxygenation with 100% oxygen Intubation Type: IV induction Ventilation: Mask ventilation without difficulty LMA: LMA inserted LMA Size: 4.0 Number of attempts: 1 Airway Equipment and Method: Bite block Placement Confirmation: positive ETCO2 Tube secured with: Tape Dental Injury: Teeth and Oropharynx as per pre-operative assessment

## 2016-01-22 NOTE — Discharge Instructions (Signed)
[facility] °Office Phone Number [phone] ° °BREAST BIOPSY/ PARTIAL MASTECTOMY: POST OP INSTRUCTIONS ° °Always review your discharge instruction sheet given to you by the facility where your surgery was performed. ° °IF YOU HAVE DISABILITY OR FAMILY LEAVE FORMS, YOU MUST BRING THEM TO THE OFFICE FOR PROCESSING.  DO NOT GIVE THEM TO YOUR DOCTOR. ° °1. A prescription for pain medication may be given to you upon discharge.  Take your pain medication as prescribed, if needed.  If narcotic pain medicine is not needed, then you may take acetaminophen (Tylenol) or ibuprofen (Advil) as needed. °2. Take your usually prescribed medications unless otherwise directed °3. If you need a refill on your pain medication, please contact your pharmacy.  They will contact our office to request authorization.  Prescriptions will not be filled after 5pm or on week-ends. °4. You should eat very light the first 24 hours after surgery, such as soup, crackers, pudding, etc.  Resume your normal diet the day after surgery. °5. Most patients will experience some swelling and bruising in the breast.  Ice packs and a good support bra will help.  Swelling and bruising can take several days to resolve.  °6. It is common to experience some constipation if taking pain medication after surgery.  Increasing fluid intake and taking a stool softener will usually help or prevent this problem from occurring.  A mild laxative (Milk of Magnesia or Miralax) should be taken according to package directions if there are no bowel movements after 48 hours. °7. Unless discharge instructions indicate otherwise, you may remove your bandages 24-48 hours after surgery, and you may shower at that time.  You may have steri-strips (small skin tapes) in place directly over the incision.  These strips should be left on the skin for 7-10 days.  If your surgeon used skin glue on the incision, you may shower in 24 hours.  The glue will flake off over the  next 2-3 weeks.  Any sutures or staples will be removed at the office during your follow-up visit. °8. ACTIVITIES:  You may resume regular daily activities (gradually increasing) beginning the next day.  Wearing a good support bra or sports bra minimizes pain and swelling.  You may have sexual intercourse when it is comfortable. °a. You may drive when you no longer are taking prescription pain medication, you can comfortably wear a seatbelt, and you can safely maneuver your car and apply brakes. °b. RETURN TO WORK:  ______________________________________________________________________________________ °9. You should see your doctor in the office for a follow-up appointment approximately two weeks after your surgery.  Your doctor’s nurse will typically make your follow-up appointment when she calls you with your pathology report.  Expect your pathology report 2-3 business days after your surgery.  You may call to check if you do not hear from us after three days. °10. OTHER INSTRUCTIONS: _______________________________________________________________________________________________ _____________________________________________________________________________________________________________________________________ °_____________________________________________________________________________________________________________________________________ °_____________________________________________________________________________________________________________________________________ ° °WHEN TO CALL YOUR DOCTOR: °1. Fever over 101.0 °2. Nausea and/or vomiting. °3. Extreme swelling or bruising. °4. Continued bleeding from incision. °5. Increased pain, redness, or drainage from the incision. ° °The clinic staff is available to answer your questions during regular business hours.  Please don’t hesitate to call and ask to speak to one of the nurses for clinical concerns.  If you have a medical emergency, go to the nearest  emergency room or call 911.  A surgeon from [facility] is always on call at the hospital. ° °For further questions, please visit [email or web address]  ° ° ° °  Post Anesthesia Home Care Instructions ° °Activity: °Get plenty of rest for the remainder of the day. A responsible adult should stay with you for 24 hours following the procedure.  °For the next 24 hours, DO NOT: °-Drive a car °-Operate machinery °-Drink alcoholic beverages °-Take any medication unless instructed by your physician °-Make any legal decisions or sign important papers. ° °Meals: °Start with liquid foods such as gelatin or soup. Progress to regular foods as tolerated. Avoid greasy, spicy, heavy foods. If nausea and/or vomiting occur, drink only clear liquids until the nausea and/or vomiting subsides. Call your physician if vomiting continues. ° °Special Instructions/Symptoms: °Your throat may feel dry or sore from the anesthesia or the breathing tube placed in your throat during surgery. If this causes discomfort, gargle with warm salt water. The discomfort should disappear within 24 hours. ° °If you had a scopolamine patch placed behind your ear for the management of post- operative nausea and/or vomiting: ° °1. The medication in the patch is effective for 72 hours, after which it should be removed.  Wrap patch in a tissue and discard in the trash. Wash hands thoroughly with soap and water. °2. You may remove the patch earlier than 72 hours if you experience unpleasant side effects which may include dry mouth, dizziness or visual disturbances. °3. Avoid touching the patch. Wash your hands with soap and water after contact with the patch. °  ° °

## 2016-01-22 NOTE — Interval H&P Note (Signed)
History and Physical Interval Note:  01/22/2016 9:59 AM  Samantha Sullivan  has presented today for surgery, with the diagnosis of cancer right breast  The various methods of treatment have been discussed with the patient and family. After consideration of risks, benefits and other options for treatment, the patient has consented to  Procedure(s): BREAST LUMPECTOMY WITH RADIOACTIVE SEED AND SENTINEL LYMPH NODE BIOPSY (Right) as a surgical intervention .  The patient's history has been reviewed, patient examined, no change in status, stable for surgery.  I have reviewed the patient's chart and labs.  Questions were answered to the patient's satisfaction.     Tala Eber T

## 2016-01-22 NOTE — H&P (Signed)
History of Present Illness Marland Kitchen T. Arul Farabee MD; 01/15/2016 5:35 PM) Patient words: New-Breast Cx.  The patient is a 65 year old female who presents with breast cancer. Patint is a 65 year old post menopausal female referred by Dr. Marcelo Baldy for evaluation of recently diagnosed carcinoma of the right breast. She discovered a mass in her right breast on self-exam in approximately September. She recently presented for evaluation at [facility].. Subsequent imaging included diagnostic mamogram showing an oval mass in the lateral right breast and ultrasound showing a 2.2 cm hypo-echoic irregular mass at the 9 o'clock position of the right breast posterior depth. An ultrasound guided breast biopsy was performed on January 02, 2016 with pathology revealing invasive ductal carcinoma of the breast associated with DCIS. She is seen now in the office for initial treatment planning. She has experienced a lump as noted above but no skin changes or nipple discharge or unusual pain. She has a history of fibrocystic breast changes but no previous biopsies. She was on long-term estrogen replacement which she stopped at the time of her diagnosis.  Findings at that time were the following: Tumor size: 2.2 cm Tumor grade: 1, Ki-67 10% Estrogen Receptor: Positive Progesterone Receptor: Positive Her-2 neu: Negative Lymph node status: Negative    Other Problems Malachi Bonds, CMA; 01/15/2016 2:36 PM) Arthritis Lump In Breast  Past Surgical History Malachi Bonds, CMA; 01/15/2016 2:36 PM) Breast Biopsy Right. Cesarean Section - Multiple Thyroid Surgery  Diagnostic Studies History Malachi Bonds, CMA; 01/15/2016 2:36 PM) Colonoscopy >10 years ago Mammogram within last year  Allergies Malachi Bonds, CMA; 01/15/2016 2:37 PM) No Known Drug Allergies01/24/2017  Medication History Malachi Bonds, CMA; 01/15/2016 2:38 PM) Zolpidem Tartrate (5MG Tablet, Oral) Active. EpiPen 2-Pak (0.3MG/0.3ML Soln  Auto-inj, Injection) Active. Methotrexate (2.5MG Tablet, Oral) Active. Folic Acid (1MG Tablet, Oral) Active. Medications Reconciled  Social History Malachi Bonds, CMA; 01/15/2016 2:36 PM) Alcohol use Remotely quit alcohol use. Caffeine use Coffee. No drug use Tobacco use Former smoker.  Family History Malachi Bonds, CMA; 01/15/2016 2:36 PM) Alcohol Abuse Father. Arthritis Sister. Depression Sister. Ovarian Cancer Family Members In General. Thyroid problems Family Members In General.  Pregnancy / Birth History Malachi Bonds, [place]; 01/15/2016 2:36 PM) Age at menarche 46 years. Gravida 2 Maternal age 68-25 Para 2    Review of Systems Malachi Bonds CMA; 01/15/2016 2:36 PM) General Present- Weight Loss. Not Present- Appetite Loss, Chills, Fatigue, Fever, Night Sweats and Weight Gain. Skin Not Present- Change in Wart/Mole, Dryness, Hives, Jaundice, New Lesions, Non-Healing Wounds, Rash and Ulcer. HEENT Present- Wears glasses/contact lenses. Not Present- Earache, Hearing Loss, Hoarseness, Nose Bleed, Oral Ulcers, Ringing in the Ears, Seasonal Allergies, Sinus Pain, Sore Throat, Visual Disturbances and Yellow Eyes. Respiratory Not Present- Bloody sputum, Chronic Cough, Difficulty Breathing, Snoring and Wheezing. Breast Present- Breast Mass. Not Present- Breast Pain, Nipple Discharge and Skin Changes. Cardiovascular Not Present- Chest Pain, Difficulty Breathing Lying Down, Leg Cramps, Palpitations, Rapid Heart Rate, Shortness of Breath and Swelling of Extremities. Gastrointestinal Not Present- Abdominal Pain, Bloating, Bloody Stool, Change in Bowel Habits, Chronic diarrhea, Constipation, Difficulty Swallowing, Excessive gas, Gets full quickly at meals, Hemorrhoids, Indigestion, Nausea, Rectal Pain and Vomiting. Female Genitourinary Not Present- Frequency, Nocturia, Painful Urination, Pelvic Pain and Urgency. Musculoskeletal Not Present- Back Pain, Joint Pain, Joint Stiffness,  Muscle Pain, Muscle Weakness and Swelling of Extremities. Neurological Not Present- Decreased Memory, Fainting, Headaches, Numbness, Seizures, Tingling, Tremor, Trouble walking and Weakness. Psychiatric Not Present- Anxiety, Bipolar, Change in Sleep Pattern, Depression, Fearful and  Frequent crying. Endocrine Not Present- Cold Intolerance, Excessive Hunger, Hair Changes, Heat Intolerance, Hot flashes and New Diabetes. Hematology Not Present- Easy Bruising, Excessive bleeding, Gland problems, HIV and Persistent Infections.  Vitals (Chemira Jones CMA; 01/15/2016 2:37 PM) 01/15/2016 2:37 PM Weight: 177 lb Height: 64.5in Body Surface Area: 1.87 m Body Mass Index: 29.91 kg/m  Temp.: 98.52F(Oral)  BP: 116/78 (Sitting, Left Arm, Standard)       Physical Exam Marland Kitchen T. Trevaris Pennella MD; 01/15/2016 5:32 PM) The physical exam findings are as follows: Note:General: Alert, well-developed and well nourished Caucasian female, in no distress Skin: Warm and dry without rash or infection. HEENT: No palpable masses or thyromegaly. Sclera nonicteric. Pupils equal round and reactive. Lymph nodes: No cervical, supraclavicular, or inguinal nodes palpable. Breasts: In the lateral right breast is an approximately 2 cm somewhat indistinct palpable mass. No skin changes or nipple inversion or crusting. Lungs: Breath sounds clear and equal. No wheezing or increased work of breathing. Abdomen: Soft and nontender. No masses or organomegaly. Cardiovascular: Regular rate and rhythm without murmer. No JVD or edema. Extremities: No edema or joint swelling or deformity. No chronic venous stasis changes. Neurologic: Alert and fully oriented. Gait normal. No focal weakness. Psychiatric: Normal mood and affect. Thought content appropriate with normal judgement and insight    Assessment & Plan Marland Kitchen T. Rahul Malinak MD; 01/15/2016 5:35 PM) MALIGNANT NEOPLASM OF RIGHT BREAST, STAGE 2 (C50.911) Impression: 65 year  old female with a new diagnosis of cancer of the right breast breast, upper outer quadrant. Clinical stage IIA, ER positive, PR positive, HER-2 negative. I discussed with the patient and her friend present today initial surgical treatment options. We discussed options of breast conservation with lumpectomy or total mastectomy and sentinal lymph node biopsy/dissection. After discussion they have elected to proceed with breast conservation with lumpectomy and sentinel lymph node biopsy. We discussed the indications and nature of the procedure, and expected recovery, in detail. Surgical risks including anesthetic complications, cardiorespiratory complications, bleeding, infection, wound healing complications, blood clots, lymphedema, local and distant recurrence and possible need for further surgery based on the final pathology was discussed and understood. Chemotherapy, hormonal therapy and radiation therapy have been discussed. They have been provided with literature regarding the treatment of breast cancer. All questions were answered. They understand and agree to proceed and we will go ahead with scheduling. Current Plans Radioactive seed localized right breast lumpectomy and right axillary sentinel lymph node biopsy under general anesthesia as an outpatient

## 2016-01-22 NOTE — Op Note (Signed)
Preoperative Diagnosis: cancer right breast  Postoprative Diagnosis: cancer right breast  Procedure: Procedure(s): Blue dye injection right breast, RIGHT BREAST LUMPECTOMY WITH RADIOACTIVE SEED AND SENTINEL LYMPH NODE BIOPSY   Surgeon: Excell Seltzer T   Assistants: none  Anesthesia:  General LMA anesthesia  Indications: patient is a 65 year old female with a recent diagnosis of a 2.2 cm invasive and in situ ER/PR positive cancer of the upper outer right breast. Following consultation and discussion of options and discussion of risks detailed elsewhere we have elected to proceed with radioactive seed localized right breast lumpectomy and right axillary sentinel lymph node biopsy is initial surgical therapy.   Procedure Detail:  Patient was brought to the operating room, placed in the supine position on the operating table, and general LMA anesthesia induced. She received preoperative IV antibiotics. PAS were in place. The seed placement had been confirmed preoperatively in the holding area with the neoprobe and she had undergone pectoral block by anesthesia as well as injection of 1 mCi of technetium sulfur colloid intradermally around the right nipple. Under sterile technique after patient timeout I injected 5 mL of dilute methylene blue subcutaneously beneath the right nipple and massaged this for several minutes. Following this the entire right wrist and chest, axilla, and upper arm were widely sterilely prepped and draped. Patient timeout was again performed and correct procedure verified. The seed and tumor were localized with the neoprobe well laterally in the upper outer quadrant of the right breast. I used a curvilinear incision in the skin crease near the lateral border of the breast. A short skin and subcutaneous flap was raised medially over the tumor. Retractors were placed and using the neoprobe for guidance a generous specimen of breast tumor was excised around the seed. There was a  small palpable mass. Dissection was carried out and grossly normal breast tissue and due to location the posterior and essentially the medial margin were down to chest wall. The specimen was removed and inked for margins. Specimen mammogram showed the seton marking clip centrally located within the specimen although somewhat closer to the medial border. I did take an additional thin specimen of breast tissue and fascia medially which was down to muscle at this point. Hemostasis was obtained with cautery. The incision had been placed so that the sentinel node biopsy could be performed through the same incision. Using the Invuity lighted retractor the clavipectoral fascia was exposed through the same incision and the neoprobe was used to localize a hot area in the axilla. The clavipectoral fascia was incised and blunt dissection was carried down onto a bright blue slightly enlarged lymph node with elevated counts. It was elevated and completely excised with cautery, clipping the hilum. Ex vivo the node had counts of 750. Background in the axilla was less than 5 and there was no palpable adenopathy. This was sent as hot blue axillary sentinel lymph node. Hemostasis was obtained in the wound. The lumpectomy cavity was marked with clips. The deep and subcutaneous tissue was closed with interrupted 3-0 Vicryl and the skin with running subcuticular 4-0 Monocryl and Dermabond. Sponge needle and instrument counts were correct.    Findings: As above  Estimated Blood Loss:  Minimal         Drains: none  Blood Given: none          Specimens: #1 right breast lumpectomy    #2 further medial margin     #3 hot blue right axillary sentinel lymph node  Complications:  * No complications entered in OR log *         Disposition: PACU - hemodynamically stable.         Condition: stable

## 2016-01-22 NOTE — Anesthesia Preprocedure Evaluation (Addendum)
Anesthesia Evaluation  Patient identified by MRN, date of birth, ID band Patient awake    Reviewed: Allergy & Precautions, NPO status , Patient's Chart, lab work & pertinent test results  Airway Mallampati: II  TM Distance: >3 FB Neck ROM: Full    Dental  (+) Teeth Intact   Pulmonary former smoker,    breath sounds clear to auscultation       Cardiovascular negative cardio ROS   Rhythm:Regular Rate:Normal     Neuro/Psych PSYCHIATRIC DISORDERS Depression negative neurological ROS     GI/Hepatic negative GI ROS, Neg liver ROS,   Endo/Other  negative endocrine ROS  Renal/GU negative Renal ROS  negative genitourinary   Musculoskeletal  (+) Arthritis ,   Abdominal   Peds negative pediatric ROS (+)  Hematology negative hematology ROS (+)   Anesthesia Other Findings   Reproductive/Obstetrics negative OB ROS                            Lab Results  Component Value Date   WBC 9.2 01/15/2016   HGB 14.4 01/15/2016   HCT 43.4 01/15/2016   MCV 94.7 01/15/2016   PLT 256 01/15/2016   Lab Results  Component Value Date   CREATININE 0.8 01/15/2016   BUN 13.4 01/15/2016   NA 139 01/15/2016   K 4.2 01/15/2016   CO2 27 01/15/2016   No results found for: INR, PROTIME   Anesthesia Physical Anesthesia Plan  ASA: II  Anesthesia Plan: General and Regional   Post-op Pain Management: GA combined w/ Regional for post-op pain   Induction: Intravenous  Airway Management Planned: LMA  Additional Equipment:   Intra-op Plan:   Post-operative Plan: Extubation in OR  Informed Consent: I have reviewed the patients History and Physical, chart, labs and discussed the procedure including the risks, benefits and alternatives for the proposed anesthesia with the patient or authorized representative who has indicated his/her understanding and acceptance.   Dental advisory given  Plan Discussed with:  CRNA  Anesthesia Plan Comments:         Anesthesia Quick Evaluation

## 2016-01-22 NOTE — Progress Notes (Signed)
Emotional support during breast injections °

## 2016-01-22 NOTE — Progress Notes (Signed)
Assisted Dr. Hans Eden with right, ultrasound guided, pectoralis block. Side rails up, monitors on throughout procedure. See vital signs in flow sheet. Tolerated Procedure well.

## 2016-01-22 NOTE — Anesthesia Postprocedure Evaluation (Signed)
Anesthesia Post Note  Patient: Samantha Sullivan  Procedure(s) Performed: Procedure(s) (LRB): RIGHT BREAST LUMPECTOMY WITH RADIOACTIVE SEED AND SENTINEL LYMPH NODE BIOPSY (Right)  Patient location during evaluation: PACU Anesthesia Type: General and Regional Level of consciousness: awake and alert Pain management: pain level controlled Vital Signs Assessment: post-procedure vital signs reviewed and stable Respiratory status: spontaneous breathing, nonlabored ventilation, respiratory function stable and patient connected to nasal cannula oxygen Cardiovascular status: blood pressure returned to baseline and stable Postop Assessment: no signs of nausea or vomiting Anesthetic complications: no    Last Vitals:  Filed Vitals:   01/22/16 1200 01/22/16 1215  BP: 138/73 139/79  Pulse: 64 62  Temp:    Resp: 15 13    Last Pain:  Filed Vitals:   01/22/16 1225  PainSc: 2                  Effie Berkshire

## 2016-01-22 NOTE — Transfer of Care (Signed)
Immediate Anesthesia Transfer of Care Note  Patient: Samantha Sullivan  Procedure(s) Performed: Procedure(s): RIGHT BREAST LUMPECTOMY WITH RADIOACTIVE SEED AND SENTINEL LYMPH NODE BIOPSY (Right)  Patient Location: PACU  Anesthesia Type:GA combined with regional for post-op pain  Level of Consciousness: awake and patient cooperative  Airway & Oxygen Therapy: Patient Spontanous Breathing and Patient connected to face mask oxygen  Post-op Assessment: Report given to RN and Post -op Vital signs reviewed and stable  Post vital signs: Reviewed and stable  Last Vitals:  Filed Vitals:   01/22/16 0956 01/22/16 1131  BP:  141/77  Pulse: 76   Temp:    Resp: 17     Complications: No apparent anesthesia complications

## 2016-01-23 ENCOUNTER — Encounter (HOSPITAL_BASED_OUTPATIENT_CLINIC_OR_DEPARTMENT_OTHER): Payer: Self-pay | Admitting: General Surgery

## 2016-01-24 ENCOUNTER — Telehealth: Payer: Self-pay | Admitting: *Deleted

## 2016-01-24 NOTE — Telephone Encounter (Signed)
Received order for oncotype testing per Dr. Magrinat. Requisition sent to pathology. Received by Christy. 

## 2016-01-30 ENCOUNTER — Other Ambulatory Visit: Payer: Commercial Managed Care - PPO

## 2016-01-30 ENCOUNTER — Encounter: Payer: Self-pay | Admitting: Genetic Counselor

## 2016-01-30 ENCOUNTER — Ambulatory Visit (HOSPITAL_BASED_OUTPATIENT_CLINIC_OR_DEPARTMENT_OTHER): Payer: Commercial Managed Care - PPO | Admitting: Genetic Counselor

## 2016-01-30 DIAGNOSIS — D329 Benign neoplasm of meninges, unspecified: Secondary | ICD-10-CM

## 2016-01-30 DIAGNOSIS — Z315 Encounter for genetic counseling: Secondary | ICD-10-CM

## 2016-01-30 DIAGNOSIS — Z8041 Family history of malignant neoplasm of ovary: Secondary | ICD-10-CM | POA: Diagnosis not present

## 2016-01-30 DIAGNOSIS — Z806 Family history of leukemia: Secondary | ICD-10-CM | POA: Insufficient documentation

## 2016-01-30 DIAGNOSIS — C50411 Malignant neoplasm of upper-outer quadrant of right female breast: Secondary | ICD-10-CM | POA: Diagnosis not present

## 2016-01-30 NOTE — Progress Notes (Signed)
REFERRING PROVIDER: Shon Baton, MD [place], [place] [address]   Luretha Murphy, MD  PRIMARY PROVIDER:  [name], MD  PRIMARY REASON FOR VISIT:  1. Breast cancer of upper-outer quadrant of right female breast ([place])   2. Meningioma ([place])   3. Family history of ovarian cancer   4. Family history of leukemia      HISTORY OF PRESENT ILLNESS:   Samantha Sullivan, a 65 y.o. female, was seen for a [place] cancer genetics consultation at the request of Dr. Doris Cheadle due to a personal and family history of cancer.  Samantha Sullivan presents to clinic today to discuss the possibility of a hereditary predisposition to cancer, genetic testing, and to further clarify her future cancer risks, as well as potential cancer risks for family members.   In 2017, at the age of 31, Samantha Sullivan was diagnosed with cancer of the right breast. This was treated with lumpectomy.  She is currently awaiting a mammoprint to determine if she needs chemotherapy.  She will get radiation.  Samantha Sullivan reports that when she was 21-22 she had a large mass in her left breast that was removed and benign.    CANCER HISTORY:   No history exists.     HORMONAL RISK FACTORS:  Menarche was at age 21.  First live birth at age 19.  OCP use for approximately >10 years.  Ovaries intact: yes.  Hysterectomy: no.  Menopausal status: postmenopausal.  HRT use: 27 years. Colonoscopy: yes; normal. Mammogram within the last year: yes. Number of breast biopsies: 2. Up to date with pelvic exams:  yes. Any excessive radiation exposure in the past:  no  Past Medical History  Diagnosis Date  . HSV infection   . Psoriatic arthritis ([place])   . History of meningioma 2007    in 2013 stable on MRI and recheck in 5 years  . Abnormal CT of the chest 12/06    numeroous lung nodules - most have resolved.  . Arthritis     psoriatic arthritis  . Cancer [facility])     right breast cancer  . Family history of ovarian cancer   .  Family history of leukemia     Past Surgical History  Procedure Laterality Date  . Thyroidectomy, partial Left 1983  . Breast surgery Left 1971    lumpectomy benign  . Colonoscopy  8/06    normal and recheck in 10 years  . Hammer toe surgery Left 2016  . Breast lumpectomy with radioactive seed and sentinel lymph node biopsy Right 01/22/2016    Procedure: RIGHT BREAST LUMPECTOMY WITH RADIOACTIVE SEED AND SENTINEL LYMPH NODE BIOPSY;  Surgeon: Excell Seltzer, MD;  Location: [facility];  Service: General;  Laterality: Right;    Social History   Social History  . Marital Status: Widowed    Spouse Name: N/A  . Number of Children: 2  . Years of Education: N/A   Social History Main Topics  . Smoking status: Former Smoker -- 1.00 packs/day for 12 years    Types: Cigarettes    Quit date: 07/23/1981  . Smokeless tobacco: Never Used  . Alcohol Use: No  . Drug Use: No  . Sexual Activity: No   Other Topics Concern  . 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   She has been divorced since about 71 - he has passed since then.     FAMILY HISTORY:  We obtained a detailed,  4-generation family history.  Significant diagnoses are listed below: Family History  Problem Relation Age of Onset  . Osteoporosis Mother   . Osteoporosis Sister   . Lung cancer Maternal Aunt     smoker  . Heart Problems Maternal Grandmother   . COPD Maternal Grandfather   . Neurodegenerative disease Maternal Aunt     corical-ganglion degeneration  . Ovarian cancer Maternal Aunt     dx in her 78s  . Leukemia Cousin     64s-40s; maternal cousin    The patient has two daughters who are healthy.  She had two brothers who died within days after birth from a probable heart defect.  She has one full biological sister who is cancer free.  The patient's biological father left the family and died in his 64s from unknown causes.  She has no information on his side of  the family.  Her mother is alive at 34.  She has spinal stenosis.  Her mother had four sisters.  One sister died of lung cancer (smoker), one died of an unknown cancer, and a third died of ovairan cancer.  There is one sister who is alive, and had a son with leukemia.  The patient's maternal grandparents died of COPD and heart disease.  Her grandmother had a sister who possibly died of cancer.  Patient's maternal ancestors are of English descent, and paternal ancestors are of unknown descent. There is no reported Ashkenazi Jewish ancestry. There is no known consanguinity.  GENETIC COUNSELING ASSESSMENT: Samantha Sullivan is a 65 y.o. female with a personal history of breast cancer and family history of ovarian cancer which is somewhat suggestive of a hereditary cancer syndrome and predisposition to cancer. We, therefore, discussed and recommended the following at today's visit.   DISCUSSION: We discussed that about 5-10% of breast cancer is hereditary with most cases being the result of BRCA1 and BRCA2 mutations.  Other genes that commonly have alterations in them leading to disease can be PALB2, ATM or CHEK2.  When reviewing her family history, none of these genes, at this time are thought to significantly increase the risk for ovarian cancer, but ATM mutations have been seen more commonly in the leukemia populations.  We reviewed the characteristics, features and inheritance patterns of hereditary cancer syndromes. We also discussed genetic testing, including the appropriate family members to test, the process of testing, insurance coverage and turn-around-time for results. We discussed the implications of a negative, positive and/or variant of uncertain significant result. We recommended Samantha Sullivan pursue genetic testing for the Breast/[place] cancer gene panel. The Breast/Ovarian gene panel offered by [facility] includes sequencing and rearrangement analysis for the following 20 genes:  ATM, BARD1, BRCA1,  BRCA2, BRIP1, CDH1, CHEK2, EPCAM, FANCC, MLH1, MSH2, MSH6, NBN, PALB2, PMS2, PTEN, RAD51C, RAD51D, TP53, and XRCC2.     Based on Ms. Gervacio's personal and family history of cancer, she meets medical criteria for genetic testing. Despite that she meets criteria, she may still have an out of pocket cost. We discussed that if her out of pocket cost for testing is over $100, the laboratory will call and confirm whether she wants to proceed with testing.  If the out of pocket cost of testing is less than $100 she will be billed by the genetic testing laboratory.   PLAN: After considering the risks, benefits, and limitations, Samantha Sullivan  provided informed consent to pursue genetic testing and the blood sample was sent to [facility] for analysis of the Breast/Ovarian  cancer panel. Results should be available within approximately 2-3 weeks' time, at which point they will be disclosed by telephone to Samantha Sullivan, as will any additional recommendations warranted by these results. Ms. Quakenbush will receive a summary of her genetic counseling visit and a copy of her results once available. This information will also be available in Epic. We encouraged Ms. Bjorn to remain in contact with cancer genetics annually so that we can continuously update the family history and inform her of any changes in cancer genetics and testing that may be of benefit for her family. Ms. Cermak questions were answered to her satisfaction today. Our contact information was provided should additional questions or concerns arise.  Lastly, we encouraged Ms. Warsame to remain in contact with cancer genetics annually so that we can continuously update the family history and inform her of any changes in cancer genetics and testing that may be of benefit for this family.   Ms.  Million questions were answered to her satisfaction today. Our contact information was provided should additional questions or concerns arise. Thank you for  the referral and allowing [place] to share in the care of your patient.   Angas Isabell P. Florene Glen, [place], [facility] Certified Genetic Counselor [email or web address] phone: [phone]  The patient was seen for a total of 45 minutes in face-to-face genetic counseling.  This patient was discussed with Drs. Magrinat, Lindi Adie and/or Burr Medico who agrees with the above.    _______________________________________________________________________ For Office Staff:  Number of people involved in session: 1 Was an Intern/ student involved with case: no

## 2016-02-04 ENCOUNTER — Telehealth: Payer: Self-pay | Admitting: *Deleted

## 2016-02-04 ENCOUNTER — Encounter (HOSPITAL_COMMUNITY): Payer: Self-pay

## 2016-02-04 NOTE — Telephone Encounter (Signed)
Spoke with patient and informed her of the oncotype results of 18.  Per Dr. Jana Hakim no chemo needed.  Informed her she would be getting a call to schedule an appointment back with Dr. Pablo Ledger. Encouraged her to call with any needs or concerns.

## 2016-02-06 ENCOUNTER — Ambulatory Visit: Payer: Self-pay | Admitting: Radiation Oncology

## 2016-02-08 ENCOUNTER — Ambulatory Visit (HOSPITAL_BASED_OUTPATIENT_CLINIC_OR_DEPARTMENT_OTHER): Payer: Commercial Managed Care - PPO | Admitting: Oncology

## 2016-02-08 VITALS — BP 122/70 | HR 63 | Temp 98.2°F | Resp 18 | Wt 173.6 lb

## 2016-02-08 DIAGNOSIS — C50411 Malignant neoplasm of upper-outer quadrant of right female breast: Secondary | ICD-10-CM

## 2016-02-08 DIAGNOSIS — Z170 Estrogen receptor positive status [ER+]: Secondary | ICD-10-CM | POA: Diagnosis not present

## 2016-02-08 NOTE — Progress Notes (Signed)
Dear  [facility]  [phone] [phone]     ID: [name] DOB: 1951-09-23  MR#: [id number]  SNK#:[id number]  Patient Care Team: Shon Baton, MD as PCP - General (Internal Medicine) Chauncey Cruel, MD as Consulting Physician (Oncology) Kem Boroughs, FNP as Nurse Practitioner (Family Medicine) Wallene Huh, DPM as Consulting Physician (Podiatry) Excell Seltzer, MD as Consulting Physician (General Surgery) Thea Silversmith, MD as Consulting Physician (Radiation Oncology) PCP: [name], MD OTHER MD:  CHIEF COMPLAINT: Estrogen receptor positive breast cancer  CURRENT TREATMENT: adjuvant radiation   BREAST CANCER HISTORY:  From the original intake note:  "[name]"noted a lump in her right breast sometime in late October or early November, but there were "so many things going on in her life" she did not bring it to medical attention until she saw [name] 12/11/2015. [name] was able to palpate a mass at the 9:30 o'clock position in the right breast, which was mobile and nontender. The left breast is status post prior remote biopsy and there was some scar tissue at the 7:00 position. The patient was set up for bilateral diagnostic Mammography with tomosynthesis at [facility] 12/27/2015. This found the breast density to be category C. There was a new oval mass in the right breast upper outer quadrant. Ultrasound the same day confirmed a 2.2 cm mass with irregular margins in the right breast at the 9:00 position read as highly suggestive of malignancy.  Biopsy of the right breast mass in question 01/02/2016 found ([id number]) an invasive ductal carcinoma, grade 1, estrogen receptor 100% positive, progesterone receptor 90% positive, both with strong staining intensity, with an MIB-1 of 10%, and no HER-2 amplification, the signals ratio being 1.32 and the number per cell 2.05. The patient was informed and instructed to stop hormone replacement  therapy (last dose 01/04/2016).  Her subsequent history is as detailed below.   INTERVAL HISTORY: [name] returns today for discussion of her surgery and Oncotype results. Since her last visit here she underwent right lumpectomy and sentinel lymph node sampling, on 01/22/2016. The final pathology ([id number]) showed an invasive ductal carcinoma, grade 1, measuring 1.9 cm. The single sentinel lymph node was clear. Margins were close but negative. Repeat HER-2 testing was again not amplified, with a signals ratio of 1.03, the number per cell being 1.85  We then obtained on Oncotype DX on this material and it showed a recurrent score of 18, predicting a risk of recurrence outside the breast within the next 10 years of 11% if her only adjuvant systemic therapy was tamoxifen for 5 years.  REVIEW OF SYSTEMS: She did well with her surgery, without unusual fever, bleeding, or pain. She has very met with radiation oncology and is looking forward to proceeding with adjuvant radiation within the next 2 weeks. A detailed review of systems today was otherwise noncontributory  PAST MEDICAL HISTORY: Past Medical History  Diagnosis Date  . HSV infection   . Psoriatic arthritis ([place])   . History of meningioma 2007    in 2013 stable on MRI and recheck in 5 years  . Abnormal CT of the chest 12/06    numeroous lung nodules - most have resolved.  . Arthritis     psoriatic arthritis  . Cancer [facility])     right breast cancer  . Family history of ovarian cancer   . Family history of leukemia     PAST SURGICAL HISTORY: Past Surgical History  Procedure Laterality Date  .  Thyroidectomy, partial Left 1983  . Breast surgery Left 1971    lumpectomy benign  . Colonoscopy  8/06    normal and recheck in 10 years  . Hammer toe surgery Left 2016  . Breast lumpectomy with radioactive seed and sentinel lymph node biopsy Right 01/22/2016    Procedure: RIGHT BREAST LUMPECTOMY WITH RADIOACTIVE SEED AND SENTINEL LYMPH NODE  BIOPSY;  Surgeon: Excell Seltzer, MD;  Location: [facility];  Service: General;  Laterality: Right;    FAMILY HISTORY Family History  Problem Relation Age of Onset  . Osteoporosis Mother   . Osteoporosis Sister   . Lung cancer Maternal Aunt     smoker  . Heart Problems Maternal Grandmother   . COPD Maternal Grandfather   . Neurodegenerative disease Maternal Aunt     corical-ganglion degeneration  . Ovarian cancer Maternal Aunt     dx in her 46s  . Leukemia Cousin     36s-40s; maternal cousin  [name] has essentially no information on her father's side of the family aside from the fact that he died in his 61s from heart disease. He had psoriasis. The patient's mother is living as of January 2017, age 53. The patient had 2 brothers who died from congenital heart disease shortly after birth. The patient has one sister. The patient's mother had one sister, the patient's aunt, diagnosed with ovarian cancer in her 79s.  GYNECOLOGIC HISTORY:  Patient's last menstrual period was 03/22/2000 (approximate). Menarche age 20, first live birth age 35. The patient is GX P2. She went through menopause approximately 1990 but was taking hormone replacement until 01/04/2016. Since coming off replacement she has had insomnia but no hot flashes or vaginal dryness pleural bones so 4. She did take oral contraceptives remotely for more than 10 years with no complications  SOCIAL HISTORY:  [name] works as a Statistician. She is divorced and lives alone, with no pets. Her daughter [name] lives in [place]. She is disabled secondary to schizoaffective disorder. Daughter [name] lives in [place] and she is a Estate agent but mostly a homemaker. The patient has 3 grandchildren, the older is being 16, the other 2 being under 62 years old. The patient attends [employer] DIRECTIVES: this is being revised, but [name] is  intending to name her daughter [name] as her healthcare power of attorney. She can be reached at [place]: Social History  Substance Use Topics  . Smoking status: Former Smoker -- 1.00 packs/day for 12 years    Types: Cigarettes    Quit date: 07/23/1981  . Smokeless tobacco: Never Used  . Alcohol Use: No     Colonoscopy: due 2017; [facility]  TKZ:SWFUXNA 2016/[place]  Bone density:remote  Lipid panel:  Allergies  Allergen Reactions  . Bee Venom   . Norco [Hydrocodone-Acetaminophen] Other (See Comments)    Pt states Norco made her extremely hyper after her surgery and she was not able to sleep.    Current Outpatient Prescriptions  Medication Sig Dispense Refill  . EPIPEN 2-PAK 0.3 MG/0.3ML SOAJ injection as needed. Reported on 3/55/7322    . folic acid (FOLVITE) 1 MG tablet Take 1 tablet by mouth daily.    . meloxicam (MOBIC) 15 MG tablet Take 15 mg by mouth as needed for pain.    . methotrexate (RHEUMATREX) 2.5 MG tablet Take 20 mg by mouth once a week. Caution:Chemotherapy. Protect from light.    Marland Kitchen  oxyCODONE (OXY IR/ROXICODONE) 5 MG immediate release tablet Take 1-2 tablets (5-10 mg total) by mouth every 6 (six) hours as needed for moderate pain, severe pain or breakthrough pain. 30 tablet 0  . zolpidem (AMBIEN) 5 MG tablet Take 1 tablet (5 mg total) by mouth at bedtime as needed for sleep. 30 tablet 1   No current facility-administered medications for this visit.    OBJECTIVE: middle-aged white woman in no acute distress Filed Vitals:   02/08/16 0850  BP: 122/70  Pulse: 63  Temp: 98.2 F (36.8 C)  Resp: 18     Body mass index is 29.78 kg/(m^2).    ECOG FS:0 - Asymptomatic  Sclerae unicteric, EOMs intact Oropharynx clear, dentition in good repair No cervical or supraclavicular adenopathy Lungs no rales or rhonchi Heart regular rate and rhythm Abd soft, nontender, positive bowel sounds MSK no focal spinal tenderness, no upper extremity  lymphedema Neuro: nonfocal, well oriented, appropriate affect Breasts: The right breast is status post lumpectomy. The incision is healing nicely, without dehiscence, swelling, or erythema. The cosmetic result is excellent. The right axilla is benign. The left breast is unremarkable   LAB RESULTS:  CMP     Component Value Date/Time   NA 139 01/15/2016 1554   K 4.2 01/15/2016 1554   CO2 27 01/15/2016 1554   GLUCOSE 90 01/15/2016 1554   BUN 13.4 01/15/2016 1554   CREATININE 0.8 01/15/2016 1554   CALCIUM 9.5 01/15/2016 1554   PROT 7.1 01/15/2016 1554   ALBUMIN 4.1 01/15/2016 1554   AST 26 01/15/2016 1554   ALT 23 01/15/2016 1554   ALKPHOS 147 01/15/2016 1554   BILITOT 0.53 01/15/2016 1554    INo results found for: SPEP, UPEP  Lab Results  Component Value Date   WBC 9.2 01/15/2016   NEUTROABS 6.6* 01/15/2016   HGB 14.4 01/15/2016   HCT 43.4 01/15/2016   MCV 94.7 01/15/2016   PLT 256 01/15/2016      Chemistry      Component Value Date/Time   NA 139 01/15/2016 1554   K 4.2 01/15/2016 1554   CO2 27 01/15/2016 1554   BUN 13.4 01/15/2016 1554   CREATININE 0.8 01/15/2016 1554      Component Value Date/Time   CALCIUM 9.5 01/15/2016 1554   ALKPHOS 147 01/15/2016 1554   AST 26 01/15/2016 1554   ALT 23 01/15/2016 1554   BILITOT 0.53 01/15/2016 1554       No results found for: LABCA2  No components found for: LABCA125  No results for input(s): INR in the last 168 hours.  Urinalysis    Component Value Date/Time   BILIRUBINUR neg 12/11/2015 0925   PROTEINUR neg 12/11/2015 0925   UROBILINOGEN negative 12/11/2015 0925   NITRITE neg 12/11/2015 0925   LEUKOCYTESUR Negative 12/11/2015 0925    STUDIES: Nm Sentinel Node Inj-no Rpt (breast)  01/22/2016  CLINICAL DATA: cancer right breast Sulfur colloid was injected intradermally by the nuclear medicine technologist for breast cancer sentinel node localization.     ASSESSMENT: 65 y.o. [place] woman Status post  right breast upper outer quadrant biopsy 01/02/2016 for a clinical T2 N0, stage IIA invasive ductal carcinoma, grade 1, estrogen and progesterone receptor positive, HER-2 not amplified, with an MIB-1 of 10%.  (1). Right lumpectomy and sentinel lymph node sampling 01/22/2016 showed a pT1c pN0, stage IA invasive ductal carcinoma, grade 1, with close but negative margins. Repeat HER-2 testing was again not amplified   (3) the Oncotype score of 18  projects a risk of recurrence outside the breast within the next 10 years of 11% if the patient's only systemic therapy is tamoxifen for 5 years. It also predicts a very marginal benefit from chemotherapy.   The patient opted against adjuvant chemotherapy; that is also a recommendation(a)    (4) adjuvant radiation pending  (5) anti-estrogens will follow radiation  ) genetics testing pending  PLAN: I spent approximately 40 minutes today with [name] going over her situation. In general this is very favorable. Her cancer was less than 2 cm and node negative, so stage I. This means is that the benefit of chemotherapy is likely to be marginal, and to quantitate that further we sent on Oncotype. This predicts a risk of the cancer coming back outside the breast sometime in the next 10 years of 11% if her only systemic therapy is tamoxifen for 5 years.  We again reviewed the difference between local treatments like surgery and radiation and systemic therapy like anti-estrogens and chemotherapy. We went in to the actual database which tells [place] more specifically that women like her who didn't receive chemotherapy as well as tamoxifen had a risk reduction of an additional 2%.  In concrete terms, if I give 100 women like her chemotherapy, 2 would benefit. 89 would not need chemotherapy because aware Aredia cured. An additional 9 having cancer come back despite chemotherapy. Accordingly 80 at 75 women like her would only get side effects from chemotherapy, no benefit at  all.  Once this was understood she was very clear in her own mind that she would not want chemotherapy for such a small benefit and indeed our recommendation is that she not receive chemotherapy.  Further, we can improve in the results predicted by the Oncotype, because we now have better options for anti-estrogens and we did 10 and 15 years ago. If for example she took tamoxifen for 10 years, [place] anastrozole for 5 years, she would have an additional 3% risk reduction in terms of breast cancer recurrence. This would bring her chance of not having a recurrence within 10 years up to 92%. This is very favorable.  At this point she is very comfortable proceeding to radiation, which remains the plan. She is going to return to see me towards the end of her radiation treatments and since she is very concerned about the possibility of developing osteopenia, although she has a normal bone density, we can consider adding denosumab twice a year if she chooses to go with anastrozole.  She has a good understanding of this plan. She knows to call for any problems that may develop before the next visit here.  Chauncey Cruel, MD   02/08/2016 8:54 AM Medical Oncology and Hematology [facility] [address] [place], [place] [address] Tel. [phone]    Fax. [phone]

## 2016-02-12 ENCOUNTER — Telehealth: Payer: Self-pay | Admitting: Oncology

## 2016-02-12 NOTE — Telephone Encounter (Signed)
Left message for patient to inform her of 4/24 appt date/time per Gm 2/17 pof

## 2016-02-13 ENCOUNTER — Ambulatory Visit
Admission: RE | Admit: 2016-02-13 | Discharge: 2016-02-13 | Disposition: A | Discharge status: Home / Self Care | Payer: Commercial Managed Care - PPO | Source: Ambulatory Visit | Attending: Radiation Oncology | Admitting: Radiation Oncology

## 2016-02-13 ENCOUNTER — Encounter: Payer: Self-pay | Admitting: Radiation Oncology

## 2016-02-13 VITALS — BP 129/72 | HR 72 | Temp 98.0°F | Ht 64.0 in | Wt 175.9 lb

## 2016-02-13 DIAGNOSIS — C50411 Malignant neoplasm of upper-outer quadrant of right female breast: Secondary | ICD-10-CM

## 2016-02-13 DIAGNOSIS — Z510 Encounter for antineoplastic radiation therapy: Secondary | ICD-10-CM | POA: Diagnosis not present

## 2016-02-13 NOTE — Progress Notes (Signed)
   Department of Radiation Oncology  Phone:  [phone] Fax:        [phone]   Name: Samantha Sullivan MRN: [id number]  DOB: January 17, 1951  Date: 02/13/2016  Follow Up Visit Note  Diagnosis: Breast cancer of upper-outer quadrant of right female breast [facility])   Staging form: Breast, AJCC 7th Edition     Clinical: Stage IIA (T2, N0, M0) - Signed by Chauncey Cruel, MD on 01/15/2016  Summary and Interval since last radiation: N/A  Interval History: Christionna presents today for routine followup. She underwent a right lumpectomy on 01/22/16. This showed a 1.9 cm idc. Perineural invasion was noted. DCIS was close at the middle margin. She had an oncotype test, which was intermediate risk, and she spoke with Dr. Jana Hakim who did not recommend chemotherapy. She presents today for my consideration of radiotherapy for the management of her disease.  She is ready to return to work next Tuesday. She denies pain.  Physical Exam:  Filed Vitals:   02/13/16 1438  BP: 129/72  Pulse: 72  Temp: 98 F (36.7 C)  TempSrc: Oral  Height: 5\' 4"  (1.626 m)  Weight: 175 lb 14.4 oz (79.788 kg)  SpO2: 100%  Pleasant woman who is alert and oriented x 3. Incision is healing well.  IMPRESSION: Zelma is a 65 y.o. female with stage IIA (T2N0) invasive ductal carcinoma of the right breast.  PLAN: I spoke to the patient today regarding her diagnosis and options for treatment. We discussed the equivalence in terms of survival and local failure between mastectomy and breast conservation. We discussed the role of radiation in decreasing local failures in patients who undergo lumpectomy. We discussed the process of CT simulation and the placement tattoos. We discussed 4-6 weeks of treatment as an outpatient. We discussed the possibility of asymptomatic lung damage. We discussed the low likelihood of secondary malignancies. We discussed the possible side effects including but not limited to skin redness, fatigue,  permanent skin darkening, and breast swelling. CT simulation has been scheduled on 02/22/16 at 3PM. She signed a consent form and this was placed in her medical chart.  Thea Silversmith, MD  This document serves as a record of services personally performed by Thea Silversmith, MD. It was created on her behalf by Darcus Austin, a trained medical scribe. The creation of this record is based on the scribe's personal observations and the provider's statements to them. This document has been checked and approved by the attending provider.

## 2016-02-13 NOTE — Progress Notes (Signed)
error 

## 2016-02-14 NOTE — Addendum Note (Signed)
Encounter addended by: Malena [name], RN on: 02/14/2016  9:17 AM<BR>     Documentation filed: Charges VN

## 2016-02-18 ENCOUNTER — Telehealth: Payer: Self-pay | Admitting: Emergency Medicine

## 2016-02-18 NOTE — Telephone Encounter (Signed)
Out of hold per Dr. Silva   

## 2016-02-18 NOTE — Telephone Encounter (Signed)
-----   Message from Nunzio Cobbs, MD sent at 02/17/2016  9:58 PM EST ----- Regarding: RE: [place] to remove from mammogram hold.   Thank you,   Brook  ----- Message -----    From: Michele Mcalpine, RN    Sent: 02/14/2016   1:07 PM      To: Brook Oletta Lamas, MD Subject: Mammogram hold                                 Dr. Quincy Simmonds,  This patient is in mammogram hold for follow up. She has had lumpectomy and now under care for radiation. Okay to remove from hold?

## 2016-02-22 ENCOUNTER — Ambulatory Visit: Payer: Commercial Managed Care - PPO | Admitting: Oncology

## 2016-02-22 ENCOUNTER — Ambulatory Visit
Admission: RE | Admit: 2016-02-22 | Discharge: 2016-02-22 | Disposition: A | Discharge status: Home / Self Care | Payer: Commercial Managed Care - PPO | Source: Ambulatory Visit | Attending: Radiation Oncology | Admitting: Radiation Oncology

## 2016-02-22 DIAGNOSIS — Z510 Encounter for antineoplastic radiation therapy: Secondary | ICD-10-CM | POA: Diagnosis not present

## 2016-02-22 DIAGNOSIS — C50411 Malignant neoplasm of upper-outer quadrant of right female breast: Secondary | ICD-10-CM

## 2016-02-22 NOTE — Progress Notes (Signed)
Radiation Oncology         [phone]) [phone] ________________________________  Name: Samantha Sullivan      MRN: [id number]          Date: 02/22/2016              DOB: 1951-11-15  Optical Surface Tracking Plan:  Since intensity modulated radiotherapy (IMRT) and 3D conformal radiation treatment methods are predicated on accurate and precise positioning for treatment, intrafraction motion monitoring is medically necessary to ensure accurate and safe treatment delivery.  The ability to quantify intrafraction motion without excessive ionizing radiation dose can only be performed with optical surface tracking. Accordingly, surface imaging offers the opportunity to obtain 3D measurements of patient position throughout IMRT and 3D treatments without excessive radiation exposure.  I am ordering optical surface tracking for this patient's upcoming course of radiotherapy. ________________________________ Signature   Reference:   Ursula Alert, J, et al. Surface imaging-based analysis of intrafraction motion for breast radiotherapy patients.Journal of [place], n. 6, nov. 2014. ISSN [id number].   Available at: <http://www.jacmp.org/index.php/jacmp/article/view/4957>.

## 2016-02-22 NOTE — Progress Notes (Signed)
Name: Samantha Sullivan   MRN: [id number]  Date:  02/22/2016  DOB: [birth year]  Status:outpatient    DIAGNOSIS: Breast cancer.  CONSENT VERIFIED: yes   SET UP: Patient is setup supine   IMMOBILIZATION:  The following immobilization was used:Custom Moldable Pillow, breast board.   NARRATIVE: [name] was brought to the [facility].  Identity was confirmed.  All relevant records and images related to the planned course of therapy were reviewed.  Then, the patient was positioned in a stable reproducible clinical set-up for radiation therapy.  Wires were placed to delineate the clinical extent of breast tissue. A wire was placed on the scar as well.  CT images were obtained.  An isocenter was placed. Skin markings were placed.  The CT images were loaded into the planning software where the target and avoidance structures were contoured.  The radiation prescription was entered and confirmed. The patient was discharged in stable condition and tolerated simulation well.    TREATMENT PLANNING NOTE:  Treatment planning then occurred. I have requested : MLC's, isodose plan, basic dose calculation  I personally designed and supervised the construction of5 medically necessary complex treatment devices for the protection of critical normal structures including the lungs and contralateral breast as well as the immobilization device which is necessary for set up certainty.   3D simulation occurred. I requested and analyzed a dose volume histogram of the heart, lungs and lumpectomy cavity.

## 2016-02-26 ENCOUNTER — Encounter: Payer: Self-pay | Admitting: Genetic Counselor

## 2016-02-26 ENCOUNTER — Ambulatory Visit: Payer: Self-pay | Admitting: Genetic Counselor

## 2016-02-26 ENCOUNTER — Telehealth: Payer: Self-pay | Admitting: Genetic Counselor

## 2016-02-26 DIAGNOSIS — C50411 Malignant neoplasm of upper-outer quadrant of right female breast: Secondary | ICD-10-CM

## 2016-02-26 DIAGNOSIS — Z806 Family history of leukemia: Secondary | ICD-10-CM

## 2016-02-26 DIAGNOSIS — Z8041 Family history of malignant neoplasm of ovary: Secondary | ICD-10-CM

## 2016-02-26 DIAGNOSIS — Z1379 Encounter for other screening for genetic and chromosomal anomalies: Secondary | ICD-10-CM

## 2016-02-26 NOTE — Telephone Encounter (Signed)
LM on VM with good news.  Asked that she CB. 

## 2016-02-26 NOTE — Progress Notes (Signed)
HPI: Ms. Taussig was previously seen in the [facility] due to a Personal and family history of cancer and concerns regarding a hereditary predisposition to cancer. Please refer to our prior cancer genetics clinic note for more information regarding Ms. Favero's medical, social and family histories, and our assessment and recommendations, at the time. Ms. Lantz recent genetic test results were disclosed to her, as were recommendations warranted by these results. These results and recommendations are discussed in more detail below.  FAMILY HISTORY:  We obtained a detailed, 4-generation family history.  Significant diagnoses are listed below: Family History  Problem Relation Age of Onset  . Osteoporosis Mother   . Osteoporosis Sister   . Lung cancer Maternal Aunt     smoker  . Heart Problems Maternal Grandmother   . COPD Maternal Grandfather   . Neurodegenerative disease Maternal Aunt     corical-ganglion degeneration  . Ovarian cancer Maternal Aunt     dx in her 43s  . Leukemia Cousin     35s-40s; maternal cousin    The patient has two daughters who are healthy. She had two brothers who died within days after birth from a probable heart defect. She has one full biological sister who is cancer free. The patient's biological father left the family and died in his 75s from unknown causes. She has no information on his side of the family. Her mother is alive at 19. She has spinal stenosis. Her mother had four sisters. One sister died of lung cancer (smoker), one died of an unknown cancer, and a third died of ovairan cancer. There is one sister who is alive, and had a son with leukemia. The patient's maternal grandparents died of COPD and heart disease. Her grandmother had a sister who possibly died of cancer. Patient's maternal ancestors are of English descent, and paternal ancestors are of unknown descent. There is no reported Ashkenazi Jewish ancestry. There  is no known consanguinity.  GENETIC TEST RESULTS: At the time of Ms. Fabro's visit, we recommended she pursue genetic testing of the Breast/Ovarian cancer gene panel. The Breast/Ovarian gene panel offered by [facility] includes sequencing and rearrangement analysis for the following 20 genes:  ATM, BARD1, BRCA1, BRCA2, BRIP1, CDH1, CHEK2, EPCAM, FANCC, MLH1, MSH2, MSH6, NBN, PALB2, PMS2, PTEN, RAD51C, RAD51D, TP53, and XRCC2.   The report date is February 24, 2016.  Genetic testing was normal, and did not reveal a deleterious mutation in these genes. The test report has been scanned into EPIC and is located under the Molecular Pathology section of the Results Review tab.   We discussed with Ms. Yuhasz that since the current genetic testing is not perfect, it is possible there may be a gene mutation in one of these genes that current testing cannot detect, but that chance is small. We also discussed, that it is possible that another gene that has not yet been discovered, or that we have not yet tested, is responsible for the cancer diagnoses in the family, and it is, therefore, important to remain in touch with cancer genetics in the future so that we can continue to offer Ms. Stegmaier the most up to date genetic testing.   CANCER SCREENING RECOMMENDATIONS: This result is reassuring and indicates that Ms. Wrinkle likely does not have an increased risk for a future cancer due to a mutation in one of these genes. This normal test also suggests that Ms. Vanmetre's cancer was most likely not due to an inherited predisposition associated  with one of these genes.  Most cancers happen by chance and this negative test suggests that her cancer falls into this category.  We, therefore, recommended she continue to follow the cancer management and screening guidelines provided by her oncology and primary healthcare provider.   RECOMMENDATIONS FOR FAMILY MEMBERS: Women in this family might be at some increased risk of  developing cancer, over the general population risk, simply due to the family history of cancer. We recommended women in this family have a yearly mammogram beginning at age 73, or 38 years younger than the earliest onset of cancer, an an annual clinical breast exam, and perform monthly breast self-exams. Women in this family should also have a gynecological exam as recommended by their primary provider. All family members should have a colonoscopy by age 34.  FOLLOW-UP: Lastly, we discussed with Ms. Oran that cancer genetics is a rapidly advancing field and it is possible that new genetic tests will be appropriate for her and/or her family members in the future. We encouraged her to remain in contact with cancer genetics on an annual basis so we can update her personal and family histories and let her know of advances in cancer genetics that may benefit this family.   Our contact number was provided. Ms. Garcia questions were answered to her satisfaction, and she knows she is welcome to call us at anytime with additional questions or concerns.   Roma Kayser, MS, [facility] Certified Genetic Counselor [email or web address]

## 2016-02-26 NOTE — Telephone Encounter (Signed)
Revealed negative genetic testing on the Breast/Ovarian cancer panel.  Discussed that a BRCA1 VUS was found.  We will treat this as a negative results.

## 2016-02-28 DIAGNOSIS — Z510 Encounter for antineoplastic radiation therapy: Secondary | ICD-10-CM | POA: Diagnosis not present

## 2016-03-03 ENCOUNTER — Ambulatory Visit
Admission: RE | Admit: 2016-03-03 | Discharge: 2016-03-03 | Disposition: A | Discharge status: Home / Self Care | Payer: Commercial Managed Care - PPO | Source: Ambulatory Visit | Attending: Radiation Oncology | Admitting: Radiation Oncology

## 2016-03-03 DIAGNOSIS — Z510 Encounter for antineoplastic radiation therapy: Secondary | ICD-10-CM | POA: Diagnosis not present

## 2016-03-04 ENCOUNTER — Encounter: Payer: Self-pay | Admitting: Radiation Oncology

## 2016-03-04 ENCOUNTER — Ambulatory Visit
Admission: RE | Admit: 2016-03-04 | Discharge: 2016-03-04 | Disposition: A | Discharge status: Home / Self Care | Payer: Commercial Managed Care - PPO | Source: Ambulatory Visit | Attending: Radiation Oncology | Admitting: Radiation Oncology

## 2016-03-04 VITALS — BP 122/71 | HR 69 | Temp 98.4°F | Resp 18 | Ht 64.0 in

## 2016-03-04 DIAGNOSIS — Z923 Personal history of irradiation: Secondary | ICD-10-CM | POA: Diagnosis not present

## 2016-03-04 DIAGNOSIS — C50411 Malignant neoplasm of upper-outer quadrant of right female breast: Secondary | ICD-10-CM | POA: Diagnosis not present

## 2016-03-04 DIAGNOSIS — Z510 Encounter for antineoplastic radiation therapy: Secondary | ICD-10-CM | POA: Diagnosis not present

## 2016-03-04 MED ORDER — RADIAPLEXRX EX GEL
Freq: Once | CUTANEOUS | Status: AC
  Administered 2016-03-04: 13:00:00 via TOPICAL

## 2016-03-04 MED ORDER — ALRA NON-METALLIC DEODORANT (RAD-ONC)
1.0000 | Freq: Once | TOPICAL | Status: AC
Start: 2016-03-04 — End: 2016-03-04
  Administered 2016-03-04: 1 via TOPICAL

## 2016-03-04 NOTE — Progress Notes (Signed)
Samantha Sullivan has received one fraction to her right breast.  Skin with norma color.  Appetite is good.  Denies pain.  Energy level is fine.  Education donePt here for patient teaching.  Pt given Radiation and You booklet, Managing Acute Radiation Side Effects for Head and Neck Cancer handout, skin care instructions, Alra deodorant and Radiaplex gel. Pt reports they have watched the Radiation Therapy Education video on February 20, 2016.  Reviewed areas of pertinence such as fatigue, hair loss, skin changes, breast tenderness, breast swelling, cough, shortness of breath, earaches and taste changes . Pt able to give teach back of to pat skin, use unscented/gentle soap, have Imodium on hand and drink plenty of water,apply Radiaplex bid, avoid applying anything to skin within 4 hours of treatment, avoid wearing an under wire bra and to use an electric razor if they must shave. Pt demonstrated understanding of information given and will contact nursing with any questions or concerns.     Http://rtanswers.org/treatmentinformation/whattoexpect/index      BP 122/71 mmHg  Pulse 69  Temp(Src) 98.4 F (36.9 C) (Oral)  Resp 18  Ht 5\' 4"  (1.626 m)  SpO2 99%  LMP 03/22/2000 (Approximate)

## 2016-03-04 NOTE — Progress Notes (Signed)
Weekly Management Note Current Dose: 2.67  Gy  Projected Dose: 42.72 Gy   Narrative:  The patient presents for routine under treatment assessment.  CBCT/MVCT images/Port film x-rays were reviewed.  The chart was checked. No questions.   Physical Findings: Weight:  . Unchanged  Impression:  The patient is tolerating radiation.  Plan:  Continue treatment as planned. RN education performed today. Start radiaplex.

## 2016-03-05 ENCOUNTER — Ambulatory Visit
Admission: RE | Admit: 2016-03-05 | Discharge: 2016-03-05 | Disposition: A | Discharge status: Home / Self Care | Payer: Commercial Managed Care - PPO | Source: Ambulatory Visit | Attending: Radiation Oncology | Admitting: Radiation Oncology

## 2016-03-05 DIAGNOSIS — Z510 Encounter for antineoplastic radiation therapy: Secondary | ICD-10-CM | POA: Diagnosis not present

## 2016-03-06 ENCOUNTER — Ambulatory Visit
Admission: RE | Admit: 2016-03-06 | Discharge: 2016-03-06 | Disposition: A | Discharge status: Home / Self Care | Payer: Commercial Managed Care - PPO | Source: Ambulatory Visit | Attending: Radiation Oncology | Admitting: Radiation Oncology

## 2016-03-06 DIAGNOSIS — Z510 Encounter for antineoplastic radiation therapy: Secondary | ICD-10-CM | POA: Diagnosis not present

## 2016-03-07 ENCOUNTER — Ambulatory Visit
Admission: RE | Admit: 2016-03-07 | Discharge: 2016-03-07 | Disposition: A | Discharge status: Home / Self Care | Payer: Commercial Managed Care - PPO | Source: Ambulatory Visit | Attending: Radiation Oncology | Admitting: Radiation Oncology

## 2016-03-07 DIAGNOSIS — Z510 Encounter for antineoplastic radiation therapy: Secondary | ICD-10-CM | POA: Diagnosis not present

## 2016-03-10 ENCOUNTER — Ambulatory Visit
Admission: RE | Admit: 2016-03-10 | Discharge: 2016-03-10 | Disposition: A | Discharge status: Home / Self Care | Payer: Commercial Managed Care - PPO | Source: Ambulatory Visit | Attending: Radiation Oncology | Admitting: Radiation Oncology

## 2016-03-10 DIAGNOSIS — Z510 Encounter for antineoplastic radiation therapy: Secondary | ICD-10-CM | POA: Diagnosis not present

## 2016-03-11 ENCOUNTER — Ambulatory Visit
Admission: RE | Admit: 2016-03-11 | Discharge: 2016-03-11 | Disposition: A | Discharge status: Home / Self Care | Payer: Commercial Managed Care - PPO | Source: Ambulatory Visit | Attending: Radiation Oncology | Admitting: Radiation Oncology

## 2016-03-11 ENCOUNTER — Encounter: Payer: Self-pay | Admitting: Radiation Oncology

## 2016-03-11 VITALS — BP 135/78 | HR 67 | Temp 98.1°F | Resp 18 | Ht 64.0 in | Wt 180.1 lb

## 2016-03-11 DIAGNOSIS — C50411 Malignant neoplasm of upper-outer quadrant of right female breast: Secondary | ICD-10-CM

## 2016-03-11 DIAGNOSIS — Z510 Encounter for antineoplastic radiation therapy: Secondary | ICD-10-CM | POA: Diagnosis not present

## 2016-03-11 NOTE — Progress Notes (Signed)
Weekly Management Note Current Dose: 16.02  Gy  Projected Dose: 42.72 Gy   Narrative:  The patient presents for routine under treatment assessment.  CBCT/MVCT images/Port film x-rays were reviewed.  The chart was checked. Using radiaplex. Energy is good.   Physical Findings: Weight: 180 lb 1.6 oz (81.693 kg). Unchanged  Impression:  The patient is tolerating radiation.  Plan:  Continue treatment as planned. Using radiaplex.

## 2016-03-11 NOTE — Progress Notes (Signed)
Samantha Sullivan has received 6 fractions to her right breast  Skin to right breast has slight redness using the Radiaplex gel.  Appetite has been super over the past week.  Energy level has been fine.  Denies any pain to her right breast. BP 135/78 mmHg  Pulse 67  Temp(Src) 98.1 F (36.7 C) (Oral)  Resp 18  Ht 5\' 4"  (1.626 m)  Wt 180 lb 1.6 oz (81.693 kg)  BMI 30.90 kg/m2  SpO2 99%  LMP 03/22/2000 (Approximate)

## 2016-03-12 ENCOUNTER — Ambulatory Visit
Admission: RE | Admit: 2016-03-12 | Discharge: 2016-03-12 | Disposition: A | Discharge status: Home / Self Care | Payer: Commercial Managed Care - PPO | Source: Ambulatory Visit | Attending: Radiation Oncology | Admitting: Radiation Oncology

## 2016-03-12 ENCOUNTER — Telehealth: Payer: Self-pay | Admitting: *Deleted

## 2016-03-12 DIAGNOSIS — Z510 Encounter for antineoplastic radiation therapy: Secondary | ICD-10-CM | POA: Diagnosis not present

## 2016-03-12 NOTE — Telephone Encounter (Signed)
Spoke with patient to follow up after start of radiation.  She states she is doing well.  Encouraged her to call with any needs or concerns. 

## 2016-03-13 ENCOUNTER — Ambulatory Visit
Admission: RE | Admit: 2016-03-13 | Discharge: 2016-03-13 | Disposition: A | Discharge status: Home / Self Care | Payer: Commercial Managed Care - PPO | Source: Ambulatory Visit | Attending: Radiation Oncology | Admitting: Radiation Oncology

## 2016-03-13 DIAGNOSIS — Z510 Encounter for antineoplastic radiation therapy: Secondary | ICD-10-CM | POA: Diagnosis not present

## 2016-03-14 ENCOUNTER — Ambulatory Visit
Admission: RE | Admit: 2016-03-14 | Discharge: 2016-03-14 | Disposition: A | Discharge status: Home / Self Care | Payer: Commercial Managed Care - PPO | Source: Ambulatory Visit | Attending: Radiation Oncology | Admitting: Radiation Oncology

## 2016-03-14 DIAGNOSIS — Z510 Encounter for antineoplastic radiation therapy: Secondary | ICD-10-CM | POA: Diagnosis not present

## 2016-03-17 ENCOUNTER — Ambulatory Visit
Admission: RE | Admit: 2016-03-17 | Discharge: 2016-03-17 | Disposition: A | Discharge status: Home / Self Care | Payer: Commercial Managed Care - PPO | Source: Ambulatory Visit | Attending: Radiation Oncology | Admitting: Radiation Oncology

## 2016-03-17 DIAGNOSIS — Z510 Encounter for antineoplastic radiation therapy: Secondary | ICD-10-CM | POA: Diagnosis not present

## 2016-03-18 ENCOUNTER — Ambulatory Visit
Admission: RE | Admit: 2016-03-18 | Discharge: 2016-03-18 | Disposition: A | Discharge status: Home / Self Care | Payer: Commercial Managed Care - PPO | Source: Ambulatory Visit | Attending: Radiation Oncology | Admitting: Radiation Oncology

## 2016-03-18 ENCOUNTER — Encounter: Payer: Self-pay | Admitting: Radiation Oncology

## 2016-03-18 VITALS — BP 131/68 | HR 67 | Temp 98.4°F | Ht 64.0 in | Wt 177.6 lb

## 2016-03-18 DIAGNOSIS — Z923 Personal history of irradiation: Secondary | ICD-10-CM | POA: Insufficient documentation

## 2016-03-18 DIAGNOSIS — C50919 Malignant neoplasm of unspecified site of unspecified female breast: Secondary | ICD-10-CM | POA: Insufficient documentation

## 2016-03-18 DIAGNOSIS — C50411 Malignant neoplasm of upper-outer quadrant of right female breast: Secondary | ICD-10-CM

## 2016-03-18 DIAGNOSIS — Z510 Encounter for antineoplastic radiation therapy: Secondary | ICD-10-CM | POA: Diagnosis not present

## 2016-03-18 MED ORDER — RADIAPLEXRX EX GEL
Freq: Once | CUTANEOUS | Status: AC
  Administered 2016-03-18: 10:00:00 via TOPICAL

## 2016-03-18 NOTE — Addendum Note (Signed)
Encounter addended by: Malena [name], RN on: 03/18/2016  2:54 PM<BR>     Documentation filed: Inpatient Patient Education

## 2016-03-18 NOTE — Progress Notes (Signed)
Samantha Sullivan has received 11 fractions to her right breast.  Erythema on anterior breast.  Skin remain intact.  She noted fatigue over the weekend.  Given Radiaplex Gel.

## 2016-03-18 NOTE — Progress Notes (Signed)
Weekly Management Note Current Dose: 29.37  Gy  Projected Dose: 52.72 Gy   Narrative:  The patient presents for routine under treatment assessment.  CBCT/MVCT images/Port film x-rays were reviewed.  The chart was checked. Using radiaplex. Energy is good. Nipple is sore.   Physical Findings: Weight: 177 lb 9.6 oz (80.559 kg). Slightly pink.   Impression:  The patient is tolerating radiation.  Plan:  Continue treatment as planned. Using radiaplex.

## 2016-03-19 ENCOUNTER — Ambulatory Visit
Admission: RE | Admit: 2016-03-19 | Discharge: 2016-03-19 | Disposition: A | Discharge status: Home / Self Care | Payer: Commercial Managed Care - PPO | Source: Ambulatory Visit | Attending: Radiation Oncology | Admitting: Radiation Oncology

## 2016-03-19 DIAGNOSIS — Z510 Encounter for antineoplastic radiation therapy: Secondary | ICD-10-CM | POA: Diagnosis not present

## 2016-03-20 ENCOUNTER — Ambulatory Visit
Admission: RE | Admit: 2016-03-20 | Discharge: 2016-03-20 | Disposition: A | Discharge status: Home / Self Care | Payer: Commercial Managed Care - PPO | Source: Ambulatory Visit | Attending: Radiation Oncology | Admitting: Radiation Oncology

## 2016-03-20 DIAGNOSIS — Z510 Encounter for antineoplastic radiation therapy: Secondary | ICD-10-CM | POA: Diagnosis not present

## 2016-03-21 ENCOUNTER — Ambulatory Visit
Admission: RE | Admit: 2016-03-21 | Discharge: 2016-03-21 | Disposition: A | Discharge status: Home / Self Care | Payer: Commercial Managed Care - PPO | Source: Ambulatory Visit | Attending: Radiation Oncology | Admitting: Radiation Oncology

## 2016-03-21 DIAGNOSIS — Z510 Encounter for antineoplastic radiation therapy: Secondary | ICD-10-CM | POA: Diagnosis not present

## 2016-03-24 ENCOUNTER — Ambulatory Visit
Admission: RE | Admit: 2016-03-24 | Discharge: 2016-03-24 | Disposition: A | Discharge status: Home / Self Care | Payer: Commercial Managed Care - PPO | Source: Ambulatory Visit | Attending: Radiation Oncology | Admitting: Radiation Oncology

## 2016-03-24 DIAGNOSIS — Z510 Encounter for antineoplastic radiation therapy: Secondary | ICD-10-CM | POA: Diagnosis not present

## 2016-03-25 ENCOUNTER — Ambulatory Visit
Admission: RE | Admit: 2016-03-25 | Discharge: 2016-03-25 | Disposition: A | Discharge status: Home / Self Care | Payer: Commercial Managed Care - PPO | Source: Ambulatory Visit | Attending: Radiation Oncology | Admitting: Radiation Oncology

## 2016-03-25 DIAGNOSIS — Z510 Encounter for antineoplastic radiation therapy: Secondary | ICD-10-CM | POA: Diagnosis not present

## 2016-03-25 DIAGNOSIS — C50411 Malignant neoplasm of upper-outer quadrant of right female breast: Secondary | ICD-10-CM

## 2016-03-25 NOTE — Progress Notes (Signed)
Weekly Management Note Current Dose: 42.72 Gy  Projected Dose: 52.72 Gy   Narrative:  The patient presents for routine under treatment assessment.  CBCT/MVCT images/Port film x-rays were reviewed.  The chart was checked. Using radiaplex. Energy is good. Nipple is sore. Saw on tx machine for her electron set up.   Physical Findings: Weight:  . Slightly pink.   Impression:  The patient is tolerating radiation.  Plan:  Continue treatment as planned. Using radiaplex.

## 2016-03-26 ENCOUNTER — Ambulatory Visit
Admission: RE | Admit: 2016-03-26 | Discharge: 2016-03-26 | Disposition: A | Discharge status: Home / Self Care | Payer: Commercial Managed Care - PPO | Source: Ambulatory Visit | Attending: Radiation Oncology | Admitting: Radiation Oncology

## 2016-03-26 DIAGNOSIS — Z510 Encounter for antineoplastic radiation therapy: Secondary | ICD-10-CM | POA: Diagnosis not present

## 2016-03-27 ENCOUNTER — Telehealth: Payer: Self-pay | Admitting: *Deleted

## 2016-03-27 ENCOUNTER — Ambulatory Visit
Admission: RE | Admit: 2016-03-27 | Discharge: 2016-03-27 | Disposition: A | Discharge status: Home / Self Care | Payer: Commercial Managed Care - PPO | Source: Ambulatory Visit | Attending: Radiation Oncology | Admitting: Radiation Oncology

## 2016-03-27 DIAGNOSIS — Z510 Encounter for antineoplastic radiation therapy: Secondary | ICD-10-CM | POA: Diagnosis not present

## 2016-03-27 NOTE — Telephone Encounter (Signed)
Left message that LOA form is ready for her to pick up in the Radiation Oncology waiting area on tomorrow 03-28-16.  Asked her to stop back by after she has her treatment to pick up the form.

## 2016-03-28 ENCOUNTER — Ambulatory Visit
Admission: RE | Admit: 2016-03-28 | Discharge: 2016-03-28 | Disposition: A | Discharge status: Home / Self Care | Payer: Commercial Managed Care - PPO | Source: Ambulatory Visit | Attending: Radiation Oncology | Admitting: Radiation Oncology

## 2016-03-28 ENCOUNTER — Encounter: Payer: Self-pay | Admitting: Radiation Oncology

## 2016-03-28 DIAGNOSIS — Z510 Encounter for antineoplastic radiation therapy: Secondary | ICD-10-CM | POA: Diagnosis not present

## 2016-03-28 NOTE — Progress Notes (Signed)
Paperwork received from nurse, faxed to [facility] @ [phone], confirmation received, copy given to patient 4/7 Ardeen Fillers)

## 2016-03-31 ENCOUNTER — Ambulatory Visit
Admission: RE | Admit: 2016-03-31 | Discharge: 2016-03-31 | Disposition: A | Discharge status: Home / Self Care | Payer: Commercial Managed Care - PPO | Source: Ambulatory Visit | Attending: Radiation Oncology | Admitting: Radiation Oncology

## 2016-03-31 DIAGNOSIS — Z510 Encounter for antineoplastic radiation therapy: Secondary | ICD-10-CM | POA: Diagnosis not present

## 2016-04-01 ENCOUNTER — Ambulatory Visit
Admission: RE | Admit: 2016-04-01 | Discharge: 2016-04-01 | Disposition: A | Discharge status: Home / Self Care | Payer: Commercial Managed Care - PPO | Source: Ambulatory Visit | Attending: Radiation Oncology | Admitting: Radiation Oncology

## 2016-04-01 ENCOUNTER — Encounter: Payer: Self-pay | Admitting: Radiation Oncology

## 2016-04-01 ENCOUNTER — Telehealth: Payer: Self-pay | Admitting: *Deleted

## 2016-04-01 VITALS — BP 128/75 | HR 76 | Temp 97.9°F | Resp 16 | Ht 64.0 in | Wt 182.1 lb

## 2016-04-01 DIAGNOSIS — C50411 Malignant neoplasm of upper-outer quadrant of right female breast: Secondary | ICD-10-CM

## 2016-04-01 DIAGNOSIS — Z510 Encounter for antineoplastic radiation therapy: Secondary | ICD-10-CM | POA: Diagnosis not present

## 2016-04-01 NOTE — Telephone Encounter (Signed)
Left message for a return phone to follow up after radiation complete.

## 2016-04-01 NOTE — Progress Notes (Signed)
  Radiation Oncology         [phone]) [phone] ________________________________  Name: Samantha Sullivan MRN: [id number]  Date: 04/01/2016  DOB: July 11, 1951  End of Treatment Note  Diagnosis:   Breast cancer of upper-outer quadrant of right female breast ([place])   Staging form: Breast, AJCC 7th Edition     Clinical: Stage IIA (T2, N0, M0) - Signed by Chauncey Cruel, MD on 01/15/2016     Indication for treatment:  Curative       Radiation treatment dates:   03/04/16-04/01/16  Site/dose:   Right breast - 42.72 gy at 2.67 Gy per fraction x 16 fractions followed by an electron boost to the right breast with 10 Gy at 2 Gy per fraction x 5 fractions.   Beams/energy:   Opposed tangents with 6 and 10 MV photons with the electron boost via electron [employer] and 15 MeV electrons.   Narrative: The patient tolerated radiation treatment relatively well.   She was somewhat fatigued during treatment with minimal skin reaction.   Plan: The patient has completed radiation treatment. The patient will return to radiation oncology clinic for routine followup in one month. I advised them to call or return sooner if they have any questions or concerns related to their recovery or treatment.  ------------------------------------------------  Thea Silversmith, MD

## 2016-04-01 NOTE — Progress Notes (Signed)
Samantha Sullivan has received 21 fractions to her right breast.  Skin with normal color still has some tenderness.  Appetite is always good.  Having some fatigue after her treatment.  Denies pain today.  EOT skin care discussed to continue use of Radiplex for two weeks until redness goes away.  Then start lotion with vitamin E.  Asked if she has an appointment with her medical oncologist.  Discussed that the survivorship nurse [name] will meet with her a scheduler will call to schedule the appointment.  Reading material given for survivorship,FYNN and [employer].  Given a F/U appointment card to come back in one month to see Dr. Pablo Ledger 05-01-16.  Patient was able to verbalize that she understood the instructions and will call with any nursing care questions.

## 2016-04-04 ENCOUNTER — Other Ambulatory Visit: Payer: Self-pay | Admitting: Adult Health

## 2016-04-04 DIAGNOSIS — C50411 Malignant neoplasm of upper-outer quadrant of right female breast: Secondary | ICD-10-CM

## 2016-04-11 ENCOUNTER — Telehealth: Payer: Self-pay | Admitting: Oncology

## 2016-04-11 ENCOUNTER — Ambulatory Visit (HOSPITAL_BASED_OUTPATIENT_CLINIC_OR_DEPARTMENT_OTHER): Payer: Commercial Managed Care - PPO | Admitting: Oncology

## 2016-04-11 VITALS — BP 112/60 | HR 71 | Temp 98.3°F | Resp 18 | Ht 64.0 in | Wt 181.6 lb

## 2016-04-11 DIAGNOSIS — Z170 Estrogen receptor positive status [ER+]: Secondary | ICD-10-CM | POA: Diagnosis not present

## 2016-04-11 DIAGNOSIS — C50411 Malignant neoplasm of upper-outer quadrant of right female breast: Secondary | ICD-10-CM

## 2016-04-11 MED ORDER — ANASTROZOLE 1 MG PO TABS: 1.0000 mg | ORAL_TABLET | Freq: Every day | ORAL | Status: DC

## 2016-04-11 NOTE — Progress Notes (Signed)
Dear  [facility]  [phone] [phone]     ID: Samantha Sullivan DOB: Dec 27, 1950  MR#: [id number]  QIO#:[id number]  Patient Care Team: Shon Baton, MD as PCP - General (Internal Medicine) Chauncey Cruel, MD as Consulting Physician (Oncology) Kem Boroughs, FNP as Nurse Practitioner (Family Medicine) Wallene Huh, DPM as Consulting Physician (Podiatry) Excell Seltzer, MD as Consulting Physician (General Surgery) Thea Silversmith, MD as Consulting Physician (Radiation Oncology) PCP: [name], MD OTHER MD:  CHIEF COMPLAINT: Estrogen receptor positive breast cancer  CURRENT TREATMENT: adjuvant radiation   BREAST CANCER HISTORY:  From the original intake note:  "Samantha Sullivan"noted a lump in her right breast sometime in late October or early November, but there were "so many things going on in her life" she did not bring it to medical attention until she saw [name] 12/11/2015. Ms Samantha Sullivan was able to palpate a mass at the 9:30 o'clock position in the right breast, which was mobile and nontender. The left breast is status post prior remote biopsy and there was some scar tissue at the 7:00 position. The patient was set up for bilateral diagnostic Mammography with tomosynthesis at [facility] 12/27/2015. This found the breast density to be category C. There was a new oval mass in the right breast upper outer quadrant. Ultrasound the same day confirmed a 2.2 cm mass with irregular margins in the right breast at the 9:00 position read as highly suggestive of malignancy.  Biopsy of the right breast mass in question 01/02/2016 found ([id number]) an invasive ductal carcinoma, grade 1, estrogen receptor 100% positive, progesterone receptor 90% positive, both with strong staining intensity, with an MIB-1 of 10%, and no HER-2 amplification, the signals ratio being 1.32 and the number per cell 2.05. The patient was informed and instructed to stop hormone replacement  therapy (last dose 01/04/2016).  Her subsequent history is as detailed below.   INTERVAL HISTORY: Samantha Sullivan returns today for  Follow-up of her estrogen receptor positive breast cancer. Since her last visit here she completed her radiation treatments she generally did "fine". She went on half days but continued to work. She really was not tired until the last few days when she had her "boost ".   Then 2 days after completing the radiation she started to develop pain in the irradiated breast. This is likely needle going through her nipple. The last couple of seconds, but then it counter River breaks and recurrence for up to an hour at a time. She also has some numbness and discomfort in the lateral breast in the medial aspect of the right  Upper arm. Aside from that she continues to have problems with insomnia, which have been present 4 year and a half. Her rheumatoid arthritis is under control with essentially no medication at present. She has very minimal ankle swelling at times she still exercising by doing colitis weekly, the gym once a week with weights, and walking daily.   REVIEW OF SYSTEMS:  A detailed review of systems today was negative except as noted above  PAST MEDICAL HISTORY: Past Medical History  Diagnosis Date  . HSV infection   . Psoriatic arthritis ([place])   . History of meningioma 2007    in 2013 stable on MRI and recheck in 5 years  . Abnormal CT of the chest 12/06    numeroous lung nodules - most have resolved.  . Arthritis     psoriatic arthritis  . Cancer [facility])     right  breast cancer  . Family history of ovarian cancer   . Family history of leukemia     PAST SURGICAL HISTORY: Past Surgical History  Procedure Laterality Date  . Thyroidectomy, partial Left 1983  . Breast surgery Left 1971    lumpectomy benign  . Colonoscopy  8/06    normal and recheck in 10 years  . Hammer toe surgery Left 2016  . Breast lumpectomy with radioactive seed and sentinel lymph node  biopsy Right 01/22/2016    Procedure: RIGHT BREAST LUMPECTOMY WITH RADIOACTIVE SEED AND SENTINEL LYMPH NODE BIOPSY;  Surgeon: Excell Seltzer, MD;  Location: [place];  Service: General;  Laterality: Right;    FAMILY HISTORY Family History  Problem Relation Age of Onset  . Osteoporosis Mother   . Osteoporosis Sister   . Lung cancer Maternal Aunt     smoker  . Heart Problems Maternal Grandmother   . COPD Maternal Grandfather   . Neurodegenerative disease Maternal Aunt     corical-ganglion degeneration  . Ovarian cancer Maternal Aunt     dx in her 40s  . Leukemia Cousin     34s-40s; maternal cousin  Samantha Sullivan has essentially no information on her father's side of the family aside from the fact that he died in his 2s from heart disease. He had psoriasis. The patient's mother is living as of January 2017, age 25. The patient had 2 brothers who died from congenital heart disease shortly after birth. The patient has one sister. The patient's mother had one sister, the patient's aunt, diagnosed with ovarian cancer in her 25s.  GYNECOLOGIC HISTORY:  Patient's last menstrual period was 03/22/2000 (approximate). Menarche age 20, first live birth age 71. The patient is GX P2. She went through menopause approximately 1990 but was taking hormone replacement until 01/04/2016. Since coming off replacement she has had insomnia but no hot flashes or vaginal dryness pleural bones so 4. She did take oral contraceptives remotely for more than 10 years with no complications  SOCIAL HISTORY:  Samantha Sullivan works as a Statistician. She is divorced and lives alone, with no pets. Her daughter Samantha Sullivan lives in [place]. She is disabled secondary to schizoaffective disorder. Daughter Samantha Garnet "Jori Moll" Sullivan lives in [place] and she is a Estate agent but mostly a homemaker. The patient has 3 grandchildren, the older is being 50, the other 2 being under 68 years  old. The patient attends [employer]    ADVANCED DIRECTIVES: er daughter Samantha Sullivan is her healthcare power of attorney. She can be reached at [place]: Social History  Substance Use Topics  . Smoking status: Former Smoker -- 1.00 packs/day for 12 years    Types: Cigarettes    Quit date: 07/23/1981  . Smokeless tobacco: Never Used  . Alcohol Use: No     Colonoscopy: due 2017; [place]  NWG:NFAOZHY 2016/[place]  Bone density:remote  Lipid panel:  Allergies  Allergen Reactions  . Bee Venom   . Norco [Hydrocodone-Acetaminophen] Other (See Comments)    Pt states Norco made her extremely hyper after her surgery and she was not able to sleep.    Current Outpatient Prescriptions  Medication Sig Dispense Refill  . EPIPEN 2-PAK 0.3 MG/0.3ML SOAJ injection as needed. Reported on 8/65/7846    . folic acid (FOLVITE) 1 MG tablet Take 1 tablet by mouth daily. Reported on 04/01/2016    . meloxicam (MOBIC) 15 MG tablet Take 15 mg by mouth as needed for pain.  Reported on 04/01/2016    . methotrexate (RHEUMATREX) 2.5 MG tablet Take 20 mg by mouth once a week. Reported on 04/01/2016     No current facility-administered medications for this visit.    OBJECTIVE: middle-aged white woman  Who appears stated age 21 Vitals:   04/11/16 1015  BP: 112/60  Sullivan: 71  Temp: 98.3 F (36.8 C)  Resp: 18     Body mass index is 31.16 kg/(m^2).    ECOG FS:0 - Asymptomatic  Sclerae unicteric, pupils round and equal Oropharynx clear and moist-- no thrush or other lesions No cervical or supraclavicular adenopathy Lungs no rales or rhonchi Heart regular rate and rhythm Abd soft, nontender, positive bowel sounds MSK no focal spinal tenderness, no upper extremity lymphedema Neuro: nonfocal, well oriented, appropriate affect Breasts:  The right breast is status post lumpectomy and radiation. There is minimal residual erythema. The entire breast is perhaps 7 or 8% smaller than the  left, but the contour is preserved and the cosmetic result overall is very good. There is no evidence of recurrent disease. The right axilla is benign. The left breast is unremarkable. although    LAB RESULTS:  CMP     Component Value Date/Time   NA 139 01/15/2016 1554   K 4.2 01/15/2016 1554   CO2 27 01/15/2016 1554   GLUCOSE 90 01/15/2016 1554   BUN 13.4 01/15/2016 1554   CREATININE 0.8 01/15/2016 1554   CALCIUM 9.5 01/15/2016 1554   PROT 7.1 01/15/2016 1554   ALBUMIN 4.1 01/15/2016 1554   AST 26 01/15/2016 1554   ALT 23 01/15/2016 1554   ALKPHOS 147 01/15/2016 1554   BILITOT 0.53 01/15/2016 1554    INo results found for: SPEP, UPEP  Lab Results  Component Value Date   WBC 9.2 01/15/2016   NEUTROABS 6.6* 01/15/2016   HGB 14.4 01/15/2016   HCT 43.4 01/15/2016   MCV 94.7 01/15/2016   PLT 256 01/15/2016      Chemistry      Component Value Date/Time   NA 139 01/15/2016 1554   K 4.2 01/15/2016 1554   CO2 27 01/15/2016 1554   BUN 13.4 01/15/2016 1554   CREATININE 0.8 01/15/2016 1554      Component Value Date/Time   CALCIUM 9.5 01/15/2016 1554   ALKPHOS 147 01/15/2016 1554   AST 26 01/15/2016 1554   ALT 23 01/15/2016 1554   BILITOT 0.53 01/15/2016 1554       No results found for: LABCA2  No components found for: LABCA125  No results for input(s): INR in the last 168 hours.  Urinalysis    Component Value Date/Time   BILIRUBINUR neg 12/11/2015 0925   PROTEINUR neg 12/11/2015 0925   UROBILINOGEN negative 12/11/2015 0925   NITRITE neg 12/11/2015 0925   LEUKOCYTESUR Negative 12/11/2015 0925    STUDIES: No results found.   ASSESSMENT: 65 y.o. [place] woman status post right breast upper outer quadrant biopsy 01/02/2016 for a clinical T2 N0, stage IIA invasive ductal carcinoma, grade 1, estrogen and progesterone receptor positive, HER-2 not amplified, with an MIB-1 of 10%.  (1). Right lumpectomy and sentinel lymph node sampling 01/22/2016 showed a  pT1c pN0, stage IA invasive ductal carcinoma, grade 1, with close but negative margins. Repeat HER-2 testing was again not amplified   (3) Oncotype score of 18 projects a risk of recurrence outside the breast within the next 10 years of 11% if the patient's only systemic therapy is tamoxifen for 5 years. It also predicts  a very marginal benefit from chemotherapy.   (a) The patient opted against adjuvant chemotherapy; that is also a recommendation   (4) adjuvant radiation completed 04/01/2016  (5)  Anastrozole to start 04/21/2016  (a)  Bone density at [place] September 2013 was normal   (6) genetics testing  02/24/2016 through the Breast/Ovarian cancer gene panel offered by [facility]  Found no deleterious mutations in ATM, BARD1, BRCA1, BRCA2, BRIP1, CDH1, CHEK2, EPCAM, FANCC, MLH1, MSH2, MSH6, NBN, PALB2, PMS2, PTEN, RAD51C, RAD51D, TP53, and XRCC2.   PLAN:  Samantha Sullivan has now completed local treatment for her breast cancer, and is ready to start systemic therapy.   we reviewed the results of her Oncotype which show an  11% risk of recurrence outside the breast within 10 years if she takes tamoxifen for 5 years. We discussed the possible toxicities, side effects and complications of tamoxifen and also over anastrozole   If instead of taking tamoxifen she chooses anastrozole then her risk of recurrence would be actually lower by 2-3%. For that reason I would recommend that.    she is going to go with anastrozole. She had bone density scan at [facility] 08/31/2012 which was normal, with the lowest T score at +1.0. Visit very favorable. If she tolerates anastrozole well we will likely repeat that later this year.   I reassured her that the pains she is experiencing in the right breast are not symptoms or signs of breast cancer recurrence but are due to the treatment she received.  It is not at all uncommon for these "shooting pains" disappear 4 months and then recur. Generally we  feel this is due to an irritated nerve either because of inflammation from the radiation or because of scar tissue from the surgery. It does not predict recurrence of breast cancer.   I also reviewed her genetics testing, which was all favorable. Incidentally she has received a bill which asked for additional information. I will refer her to our genetics counselors for handling.   Samantha Sullivan has a good understanding of  The overall plan. She agrees with it. She knows  the goal of treatment in her case is cure. She will call with any problems that may develop before her next visit here.   Chauncey Cruel, MD   04/11/2016 10:47 AM Medical Oncology and Hematology [facility] [address] [place], [place] [address] Tel. [phone]    Fax. [phone]

## 2016-04-11 NOTE — Telephone Encounter (Signed)
appt made and avs printed °

## 2016-04-12 NOTE — Progress Notes (Signed)
Name: Samantha Sullivan   MRN: [id number]  Date:  03/18/2016   DOB: [birth year]  Status:outpatient    DIAGNOSIS: Breast cancer of upper-outer quadrant of right female breast [facility])   Staging form: Breast, AJCC 7th Edition     Clinical: Stage IIA (T2, N0, M0) - Signed by Chauncey Cruel, MD on 01/15/2016   CONSENT VERIFIED: yes   SET UP: Patient is setup supine   IMMOBILIZATION:  The following immobilization was used:Custom Moldable Pillow, breast board.   NARRATIVE: Samantha Sullivan underwent complex simulation and treatment planning for her boost treatment today.  Her tumor volume was outlined on the planning CT scan. The depth of her cavity was felt to be appropriate for treatment with electrons    15 MeV  MeV electrons will be prescribed to the 100%  isodose line.   I personally oversaw and approved the construction of a unique block which will be used for beam modification purposes.  An isodose plan is requested.

## 2016-04-27 NOTE — Progress Notes (Signed)
Samantha Sullivan is here for a one month for follow up visit for breast cancer of upper-outer quadrant right female breast  Skin status:Slight tanning to right breast. Lotion being used: Lotion with vitamin E. Have you seen your medical oncologist? Date If not ,when is appointment Dr. Jana Hakim 07-10-16 ER+,have started AI or Tamoxifen? If not, why? 04-11-16 Anastrozole Discuss survivorship appointment:05-01-16   [name] Offer referral reading material for Survivorship, [employer] and [facility] given 04-01-16  Appetite: Good Pain:None Arm mobility: Able to raise without discomfort. Energy level:Having less fatigue.  Getting back to normal. BP 140/68 mmHg  Pulse 64  Temp(Src) 97.5 F (36.4 C) (Oral)  Resp 16  Ht 5\' 4"  (1.626 m)  Wt 183 lb (83.008 kg)  BMI 31.40 kg/m2  SpO2 99%  LMP 03/22/2000 (Approximate)

## 2016-05-01 ENCOUNTER — Encounter: Payer: Self-pay | Admitting: Nurse Practitioner

## 2016-05-01 ENCOUNTER — Ambulatory Visit (HOSPITAL_BASED_OUTPATIENT_CLINIC_OR_DEPARTMENT_OTHER): Payer: Commercial Managed Care - PPO | Admitting: Nurse Practitioner

## 2016-05-01 ENCOUNTER — Encounter: Payer: Self-pay | Admitting: Radiation Oncology

## 2016-05-01 ENCOUNTER — Ambulatory Visit
Admission: RE | Admit: 2016-05-01 | Discharge: 2016-05-01 | Disposition: A | Discharge status: Home / Self Care | Payer: Commercial Managed Care - PPO | Source: Ambulatory Visit | Attending: Radiation Oncology | Admitting: Radiation Oncology

## 2016-05-01 VITALS — BP 127/73 | HR 66 | Temp 98.3°F | Resp 20 | Ht 64.0 in | Wt 183.3 lb

## 2016-05-01 VITALS — BP 140/68 | HR 64 | Temp 97.5°F | Resp 16 | Ht 64.0 in | Wt 183.0 lb

## 2016-05-01 DIAGNOSIS — C50411 Malignant neoplasm of upper-outer quadrant of right female breast: Secondary | ICD-10-CM

## 2016-05-01 DIAGNOSIS — Z170 Estrogen receptor positive status [ER+]: Secondary | ICD-10-CM | POA: Diagnosis not present

## 2016-05-01 NOTE — Progress Notes (Signed)
   Department of Radiation Oncology  Phone:  [phone] Fax:        [phone]   Name: Samantha Sullivan MRN: [id number]  DOB: Jan 22, 1951  Date: 05/01/2016  Follow Up Visit Note  Diagnosis: Breast cancer of upper-outer quadrant of right female breast [facility])   Staging form: Breast, AJCC 7th Edition     Clinical: Stage IIA (T2, N0, M0) - Signed by Chauncey Cruel, MD on 01/15/2016     Pathologic stage from 01/22/2016: Stage IA (T1c, N0, cM0) - Unsigned  Summary and Interval since last radiation: 1 month  03/04/16-04/01/16: Right breast - 42.72 gy at 2.67 Gy per fraction x 16 fractions followed by an electron boost to the right breast with 10 Gy at 2 Gy per fraction x 5 fractions.    Interval History: Samantha Sullivan presents today for routine followup. The patient began Anastrozole. She is planning to go back to work on 5/18 and she has resumed her daily exercise regiment.  Physical Exam:  Filed Vitals:   05/01/16 0801  BP: 140/68  Pulse: 64  Temp: 97.5 F (36.4 C)  TempSrc: Oral  Resp: 16  Height: 5\' 4"  (1.626 m)  Weight: 183 lb (83.008 kg)  SpO2: 99%  Alert and oriented x3. In no acute distress. Skin is well healed on the right breast with no hyperpigmentation.  IMPRESSION: Samantha Sullivan is a 65 y.o. female with Stage IA (T1cN0) invasive ductal carcinoma of the right breast.  PLAN: She is doing well. We discussed the need for follow up every 4-6 months which she has scheduled.  We discussed the need for yearly mammograms which she can schedule with her OBGYN or with medical oncology. We discussed the need for sun protection in the treated area.  She can always call me with questions.  I will follow up with her on an as needed basis.  I signed off the paperwork required for her to return to work.  Thea Silversmith, MD  This document serves as a record of services personally performed by Thea Silversmith, MD. It was created on her behalf by Darcus Austin, a trained medical scribe. The  creation of this record is based on the scribe's personal observations and the provider's statements to them. This document has been checked and approved by the attending provider.

## 2016-05-01 NOTE — Progress Notes (Signed)
CLINIC:  Cancer Survivorship   REASON FOR VISIT:  Routine follow-up post-treatment for a recent history of breast cancer.  BRIEF ONCOLOGIC HISTORY:    Breast cancer of upper-outer quadrant of right female breast ([place])   12/27/2015 Mammogram Right breast: new oval mass with indistinct margin UOQ, posterior depth   12/27/2015 Breast US Right breast: 2.2 cm oval mass with irregular mass at 9:00, posterior depth, hypoechoic   01/02/2016 Initial Biopsy Right breast ultrasound guided bx: Invasive ductal carcinoma, grade 1, DCIS; ER+ (100%), PR+ (90%), HER2/neu negative (ratio 1.32), Ki67 10%   01/02/2016 Clinical Stage Stage IA: T1c No   01/22/2016 Definitive Surgery Right lumpectomy/SLNB: invasive ductal carcinoma, grade 1, close but negative margins, repeat HER2/neu negative (ratio 1.03), 0/1 LN   01/22/2016 Pathologic Stage Stage IA: pT1c pN0   01/22/2016 Oncotype testing RS 18 (11% ROR)   02/24/2016 Procedure Breast/Ovarian cancer gene panel offered by [facility] found no deleterious mutations in ATM, BARD1, BRCA1, BRCA2, BRIP1, CDH1, CHEK2, EPCAM, FANCC, MLH1, MSH2, MSH6, NBN, PALB2, PMS2, PTEN, RAD51C, RAD51D, TP53, and XRCC2.    03/04/2016 - 04/01/2016 Radiation Therapy Right breast: 42.72 gy at 2.67 Gy per fraction x 16 fractions.Right breast boost with 10 Gy at 2 Gy per fraction x 5 fractions.    04/21/2016 -  Anti-estrogen oral therapy Anastrozole 1 mg daily    INTERVAL HISTORY:  Ms. Fuhriman presents to the [facility] today for our initial meeting to review her survivorship care plan detailing her treatment course for breast cancer, as well as monitoring long-term side effects of that treatment, education regarding health maintenance, screening, and overall wellness and health promotion.     Overall, Ms. Kniskern reports feeling quite well since completing her radiation therapy approximately one month ago.  She continues with fatigue but states that this is improving and that the skin changes  overlying her right breast have improved greatly.  She still has some changes about her nipple that Dr. Pablo Ledger feels may be permanent.  She saw Dr. Pablo Ledger immediately prior to this appointment who felt her to be doing well. She has noted some thickening along her right breast following the radiation. She denies any headache, cough, or shortness of breath other than mild dyspnea with exercise. She does walk most days with a friend. She denies any hot flashes from the anastrozole and thus far, is tolerating it well.  She did experience some intermittent joint pain along her hands, which has resolved. She has a history of night sweats and hot flashes and was on HRT for many years.  She also has a history of psoriatic arthritis and is currently off methotrexate.  Her joint pain has been stable but she has noted some small areas of psoriasis returning, which she is monitoring.  She has a good appetite and denies any weight loss.    REVIEW OF SYSTEMS:  General: Mild fatigue, improving, as above.  Denies fever, chills, or unintentional weight loss. HEENT: Denies visual changes, hearing loss, mouth sores or difficulty swallowing. Cardiac: Denies palpitations, chest pain, and lower extremity edema.  Respiratory: Dyspnea with hills when exercising, otherwise, denies wheeze or shortness of breath.  Breast: As above.  GI: Denies abdominal pain, constipation, diarrhea, nausea, or vomiting.  GU: Denies dysuria, hematuria, vaginal bleeding, vaginal discharge, or vaginal dryness.  Musculoskeletal: As above. Neuro: Denies recent fall or numbness / tingling in her extremities. Skin: Denies rash, pruritis, or open wounds.  Psych: Denies depression, anxiety, insomnia, or memory loss.  A 14-point review of systems was completed and was negative, except as noted above.   ONCOLOGY TREATMENT TEAM:  1. Surgeon:  Dr. Excell Seltzer at [facility]  2. Medical Oncologist: Dr. Jana Hakim 3. Radiation Oncologist:  Dr. Pablo Ledger    PAST MEDICAL/SURGICAL HISTORY:  Past Medical History  Diagnosis Date  . HSV infection   . Psoriatic arthritis ([place])   . History of meningioma 2007    in 2013 stable on MRI and recheck in 5 years  . Abnormal CT of the chest 12/06    numeroous lung nodules - most have resolved.  . Arthritis     psoriatic arthritis  . Cancer [facility])     right breast cancer  . Family history of ovarian cancer   . Family history of leukemia    Past Surgical History  Procedure Laterality Date  . Thyroidectomy, partial Left 1983  . Breast surgery Left 1971    lumpectomy benign  . Colonoscopy  8/06    normal and recheck in 10 years  . Hammer toe surgery Left 2016  . Breast lumpectomy with radioactive seed and sentinel lymph node biopsy Right 01/22/2016    Procedure: RIGHT BREAST LUMPECTOMY WITH RADIOACTIVE SEED AND SENTINEL LYMPH NODE BIOPSY;  Surgeon: Excell Seltzer, MD;  Location: [place];  Service: General;  Laterality: Right;     ALLERGIES:  Allergies  Allergen Reactions  . Bee Venom   . Norco [Hydrocodone-Acetaminophen] Other (See Comments)    Pt states Norco made her extremely hyper after her surgery and she was not able to sleep.     CURRENT MEDICATIONS:  Current Outpatient Prescriptions on File Prior to Visit  Medication Sig Dispense Refill  . anastrozole (ARIMIDEX) 1 MG tablet Take 1 tablet (1 mg total) by mouth daily. 90 tablet 4  . calcium carbonate 1250 MG capsule Take 1,250 mg by mouth 2 (two) times daily with a meal.    . folic acid (FOLVITE) 1 MG tablet Take 1 tablet by mouth daily. Reported on 04/01/2016    . meloxicam (MOBIC) 15 MG tablet Take 15 mg by mouth as needed for pain. Reported on 05/01/2016    . methotrexate (RHEUMATREX) 2.5 MG tablet Take 20 mg by mouth once a week. Reported on 05/01/2016    . EPIPEN 2-PAK 0.3 MG/0.3ML SOAJ injection as needed. Reported on 05/01/2016     No current facility-administered medications on file prior to  visit.     ONCOLOGIC FAMILY HISTORY:  Family History  Problem Relation Age of Onset  . Osteoporosis Mother   . Osteoporosis Sister   . Lung cancer Maternal Aunt     smoker  . Heart Problems Maternal Grandmother   . COPD Maternal Grandfather   . Neurodegenerative disease Maternal Aunt     corical-ganglion degeneration  . Ovarian cancer Maternal Aunt     dx in her 46s  . Leukemia Cousin     30s-40s; maternal cousin     GENETIC COUNSELING/TESTING: Yes, performed 02/24/2016: Breast/Ovarian cancer gene panel offered by [facility] found no deleterious mutations in ATM, BARD1, BRCA1, BRCA2, BRIP1, CDH1, CHEK2, EPCAM, FANCC, MLH1, MSH2, MSH6, NBN, PALB2, PMS2, PTEN, RAD51C, RAD51D, TP53, and XRCC2.    SOCIAL HISTORY:  Ciin Brazzel is divorced and lives alone in [place], [place].  She has 2 children, including one daughter who is disabled, and 4 grandchildren. 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  She is a former smoker and denies any current or history  of illicit drug use.  She uses alcohol very rarely.   PHYSICAL EXAMINATION:  Vital Signs: Filed Vitals:   05/01/16 0832  BP: 127/73  Pulse: 66  Temp: 98.3 F (36.8 C)  Resp: 20   ECOG Performance Status: 0  General: Well-nourished, well-appearing female in no acute distress.  She is unaccompanied in clinic today.   HEENT: Head is atraumatic and normocephalic.  Pupils equal and reactive to light and accomodation. Conjunctivae clear without exudate.  Sclerae anicteric. Oral mucosa is pink, moist, and intact without lesions.  Oropharynx is pink without lesions or erythema.  Lymph: No cervical, supraclavicular, infraclavicular, or axillary lymphadenopathy noted on palpation.  Cardiovascular: Regular rate and rhythm without murmurs, rubs, or gallops. Respiratory: Clear to auscultation bilaterally. Chest expansion symmetric without accessory muscle use on inspiration or expiration.  GI: Abdomen soft and  round. No tenderness to palpation. Bowel sounds normoactive in 4 quadrants. GU: Deferred.  Neuro: No focal deficits. Steady gait.  Psych: Mood and affect normal and appropriate for situation.  Extremities: No edema, cyanosis, or clubbing.  Skin: Warm and dry. No open lesions noted.   LABORATORY DATA:  None for this visit.  DIAGNOSTIC IMAGING:  None for this visit.     ASSESSMENT AND PLAN:   1. Breast cancer: Stage IA invasive ductal carcinoma of the right breast (10/2016), grade 1, ER positive, PR positive, HER2/neu negative, S/P lumpectomy/SLNB (12/2015), oncotype score 18, negative genetic testing, S/P adjuvant radiation therapy to the right breast (completed 03/2016) with adjuvant endocrine therapy with anastrozole initiated 04/21/2016.    Ms. Lamartina is doing well without clinical symptoms worrisome for disease recurrence. She will follow-up with her medical oncologist, Dr. Jana Hakim, in July 2017 with history and physical examination per surveillance protocol and will continue to see Dr. Excell Seltzer every six months.  She will continue her anti-estrogen therapy at this time and was instructed to make [place] aware if she begins to experience any new or increased side effects of the medication.  A comprehensive survivorship care plan and treatment summary was reviewed with the patient today detailing her breast cancer diagnosis, treatment course, potential late/long-term effects of treatment, appropriate follow-up care with recommendations for the future, and patient education resources.  A copy of this summary, along with a letter will be sent to the patient's primary care provider via in basket message after today's visit.  Ms. Huffstetler is welcome to return to the Survivorship Clinic in the future, as needed; no follow-up will be scheduled at this time.    2. Bone health:  Given Ms. Rymer's age/history of breast cancer and her current treatment regimen including endocrine therapy with anastrozole she is  at risk for bone demineralization.  Per our records, her last DEXA scan was performed in 2013 and was normal.  She will discuss this further with Dr. Jana Hakim at her July appointment.  We will continue to monitor this closely while she is on endocrine therapy.  In the meantime, she was encouraged to increase her consumption of foods rich in calcium and vitamin D as well as to increase her weight-bearing activities.  She was given education on specific activities to promote bone health.  3. Cancer screening:  Due to Ms. Hieronymus's history and her age, she should receive screening for skin cancers, colon cancer, and gynecologic cancers.  The information and recommendations are listed on the patient's comprehensive care plan/treatment summary and were reviewed in detail with the patient.    4. Health maintenance and wellness promotion:  Ms. Kantner was encouraged to consume 5-7 servings of fruits and vegetables per day. We reviewed the "Nutrition Rainbow" handout, as well as discussed recommendations to maximize nutrition and minimize recurrence, such as increased intake of fruits, vegetables, lean proteins, and minimizing the intake of red meats and processed foods.  She was also encouraged to engage in moderate to vigorous exercise for 30 minutes per day most days of the week. We discussed the [employer] fitness program, which is designed for cancer survivors to help them become more physically fit after cancer treatments.  She was instructed to limit her alcohol consumption and continue to abstain from tobacco use.  A copy of the "Take Control of Your Health" brochure was given to her reinforcing these recommendations.   5. Support services/counseling: It is not uncommon for this period of the patient's cancer care trajectory to be one of many emotions and stressors. We discussed an opportunity for her to participate in the next session of [facility] ("Finding Your New Normal") support group series designed for  patients after they have completed treatment.  Ms. Terhaar was encouraged to take advantage of our many other support services programs, support groups, and/or counseling in coping with her new life as a cancer survivor after completing anti-cancer treatment  She was offered support today through active listening and expressive supportive counseling.  She was given information regarding our available services and encouraged to contact me with any questions or for help enrolling in any of our support group/programs.    A total of 45 minutes of face-to-face time was spent with this patient with greater than 50% of that time in counseling and care-coordination.   Sylvan Cheese, NP  Survivorship Program [facility] [phone]   Note: PRIMARY CARE PROVIDER [name], [place] [phone]

## 2016-05-21 ENCOUNTER — Ambulatory Visit (INDEPENDENT_AMBULATORY_CARE_PROVIDER_SITE_OTHER): Payer: Commercial Managed Care - PPO

## 2016-05-21 ENCOUNTER — Ambulatory Visit (INDEPENDENT_AMBULATORY_CARE_PROVIDER_SITE_OTHER): Payer: Commercial Managed Care - PPO | Admitting: Podiatry

## 2016-05-21 ENCOUNTER — Encounter: Payer: Self-pay | Admitting: Podiatry

## 2016-05-21 ENCOUNTER — Ambulatory Visit: Payer: Self-pay

## 2016-05-21 VITALS — BP 118/71 | HR 65 | Resp 16 | Ht 66.0 in | Wt 180.0 lb

## 2016-05-21 DIAGNOSIS — Z9889 Other specified postprocedural states: Secondary | ICD-10-CM

## 2016-05-21 DIAGNOSIS — M2042 Other hammer toe(s) (acquired), left foot: Secondary | ICD-10-CM | POA: Diagnosis not present

## 2016-05-21 DIAGNOSIS — M779 Enthesopathy, unspecified: Secondary | ICD-10-CM | POA: Diagnosis not present

## 2016-05-21 DIAGNOSIS — M2041 Other hammer toe(s) (acquired), right foot: Secondary | ICD-10-CM

## 2016-05-21 MED ORDER — TRIAMCINOLONE ACETONIDE 10 MG/ML IJ SUSP: 10.0000 mg | Freq: Once | INTRAMUSCULAR | Status: DC

## 2016-05-21 MED ORDER — TRIAMCINOLONE ACETONIDE 10 MG/ML IJ SUSP
10.0000 mg | Freq: Once | INTRAMUSCULAR | Status: AC
  Administered 2016-05-21: 10 mg

## 2016-05-21 NOTE — Progress Notes (Signed)
   Subjective:    Patient ID: Samantha Sullivan, female    DOB: Apr 17, 1951, 65 y.o.   MRN: [id number]  HPI "He did surgery on my toes on my left foot back in November.  They're doing good, I just want him to see how it looks after walking on it a while.  I think there's a little scar area on the little toe.  Also, on the right foot the toe next to the little toe hurts when I walk.  It can be a sharp pain in that toe.  It's okay after I stop walking.  I just grin and bare it."    Review of Systems     Objective:   Physical Exam        Assessment & Plan:

## 2016-05-23 NOTE — Progress Notes (Signed)
Subjective:     Patient ID: Samantha Sullivan, female   DOB: [birth year], 65 y.o.   MRN: [id number]  HPI patient presents stating I just wanted to get my left toes checked for my surgery and then my right fourth toe has started to bother me and makes shoe gear difficult like my left foot   Review of Systems     Objective:   Physical Exam Neurovascular status intact muscle strength adequate with mild swelling and the fourth and fifth digits left which is localized with slight keratotic lesion fifth digit left but under good control. Patient's right fourth digit on them lateral side is inflamed at the interphalangeal joint    Assessment:     Doing well from surgery left with mild swelling still present and inflammatory changes interphalangeal joint fourth digit right    Plan:     H&P x-rays reviewed and I went ahead today and for the right fourth toe I injected the interphalangeal joint lateral side 1 mg dexamethasone Kenalog 2 mill grams Xylocaine and applied padding to the digits. Patient will be seen back as needed  X-ray report indicated that there is no indications of significant pathology with patient found to have good alignment of the fourth and fifth digits left and pressure of the fifth digit right against the fourth toe

## 2016-07-09 ENCOUNTER — Other Ambulatory Visit: Payer: Self-pay

## 2016-07-09 DIAGNOSIS — C50411 Malignant neoplasm of upper-outer quadrant of right female breast: Secondary | ICD-10-CM

## 2016-07-10 ENCOUNTER — Other Ambulatory Visit (HOSPITAL_BASED_OUTPATIENT_CLINIC_OR_DEPARTMENT_OTHER): Payer: PPO

## 2016-07-10 ENCOUNTER — Telehealth: Payer: Self-pay | Admitting: Oncology

## 2016-07-10 ENCOUNTER — Ambulatory Visit (HOSPITAL_BASED_OUTPATIENT_CLINIC_OR_DEPARTMENT_OTHER): Payer: PPO | Admitting: Oncology

## 2016-07-10 VITALS — BP 130/69 | HR 77 | Temp 98.6°F | Resp 18 | Wt 185.2 lb

## 2016-07-10 DIAGNOSIS — C50411 Malignant neoplasm of upper-outer quadrant of right female breast: Secondary | ICD-10-CM

## 2016-07-10 DIAGNOSIS — Z170 Estrogen receptor positive status [ER+]: Secondary | ICD-10-CM | POA: Diagnosis not present

## 2016-07-10 LAB — CBC WITH DIFFERENTIAL/PLATELET
BASO%: 0.1 % (ref 0.0–2.0)
Basophils Absolute: 0 10*3/uL (ref 0.0–0.1)
EOS%: 0.3 % (ref 0.0–7.0)
Eosinophils Absolute: 0 10*3/uL (ref 0.0–0.5)
HCT: 40.3 % (ref 34.8–46.6)
HGB: 14.1 g/dL (ref 11.6–15.9)
LYMPH%: 11.6 % — AB (ref 14.0–49.7)
MCH: 30.9 pg (ref 25.1–34.0)
MCHC: 35 g/dL (ref 31.5–36.0)
MCV: 88.2 fL (ref 79.5–101.0)
MONO#: 0.5 10*3/uL (ref 0.1–0.9)
MONO%: 5.1 % (ref 0.0–14.0)
NEUT#: 8.2 10*3/uL — ABNORMAL HIGH (ref 1.5–6.5)
NEUT%: 82.9 % — AB (ref 38.4–76.8)
PLATELETS: 240 10*3/uL (ref 145–400)
RBC: 4.57 10*6/uL (ref 3.70–5.45)
RDW: 13 % (ref 11.2–14.5)
WBC: 9.9 10*3/uL (ref 3.9–10.3)
lymph#: 1.1 10*3/uL (ref 0.9–3.3)

## 2016-07-10 LAB — COMPREHENSIVE METABOLIC PANEL
ALT: 13 U/L (ref 0–55)
ANION GAP: 10 meq/L (ref 3–11)
AST: 22 U/L (ref 5–34)
Albumin: 3.8 g/dL (ref 3.5–5.0)
Alkaline Phosphatase: 182 U/L — ABNORMAL HIGH (ref 40–150)
BUN: 20.8 mg/dL (ref 7.0–26.0)
CHLORIDE: 104 meq/L (ref 98–109)
CO2: 27 meq/L (ref 22–29)
Calcium: 9.8 mg/dL (ref 8.4–10.4)
Creatinine: 0.9 mg/dL (ref 0.6–1.1)
EGFR: 68 mL/min/{1.73_m2} — AB (ref >90)
GLUCOSE: 113 mg/dL (ref 70–140)
Potassium: 4.3 mEq/L (ref 3.5–5.1)
SODIUM: 140 meq/L (ref 136–145)
Total Bilirubin: 0.59 mg/dL (ref 0.20–1.20)
Total Protein: 7.2 g/dL (ref 6.4–8.3)

## 2016-07-10 MED ORDER — GABAPENTIN 300 MG PO CAPS: 300.0000 mg | ORAL_CAPSULE | Freq: Every day | ORAL | Status: DC

## 2016-07-10 NOTE — Telephone Encounter (Signed)
appt made and avs printed °

## 2016-07-10 NOTE — Progress Notes (Signed)
Dear  [facility]  [phone] [phone]     ID: Samantha Sullivan DOB: 04/10/1951  MR#: [id number]  OHY#:[id number]  Patient Care Team: Shon Baton, MD as PCP - General (Internal Medicine) Chauncey Cruel, MD as Consulting Physician (Oncology) Kem Boroughs, FNP as Nurse Practitioner (Family Medicine) Wallene Huh, DPM as Consulting Physician (Podiatry) Excell Seltzer, MD as Consulting Physician (General Surgery) Thea Silversmith, MD as Consulting Physician (Radiation Oncology) Sylvan Cheese, NP as Nurse Practitioner (Hematology and Oncology) PCP: [name], MD OTHER MD:  CHIEF COMPLAINT: Estrogen receptor positive breast cancer  CURRENT TREATMENT: Anastrozole   BREAST CANCER HISTORY:  From the original intake note:  "Samantha Sullivan"noted a lump in her right breast sometime in late October or early November, but there were "so many things going on in her life" she did not bring it to medical attention until she saw [name] 12/11/2015. Samantha Sullivan was able to palpate a mass at the 9:30 o'clock position in the right breast, which was mobile and nontender. The left breast is status post prior remote biopsy and there was some scar tissue at the 7:00 position. The patient was set up for bilateral diagnostic Mammography with tomosynthesis at [facility] 12/27/2015. This found the breast density to be category C. There was a new oval mass in the right breast upper outer quadrant. Ultrasound the same day confirmed a 2.2 cm mass with irregular margins in the right breast at the 9:00 position read as highly suggestive of malignancy.  Biopsy of the right breast mass in question 01/02/2016 found ([id number]) an invasive ductal carcinoma, grade 1, estrogen receptor 100% positive, progesterone receptor 90% positive, both with strong staining intensity, with an MIB-1 of 10%, and no HER-2 amplification, the signals ratio being 1.32 and the number per cell 2.05. The  patient was informed and instructed to stop hormone replacement therapy (last dose 01/04/2016).  Her subsequent history is as detailed below.   INTERVAL HISTORY: Samantha Sullivan returns today for follow-up of her early stage breast cancer. She started anastrozole early May. After about 6 weeks she started having significant aches and pains to the point that she had to take meloxicam about 3 times in that. Things then improved so that now what she notices is, if she has a very heavy work out at [name], she will be more sore than usual, or if he has a long car drive she will be more stiff in. However she walks about 5 miles everyday and does not get sore from that. She has occasional hot flashes, which occur worse at night. She does have a history of insomnia. Vaginal dryness is not a major issue. She wonders if her hair is beginning to thin out. Review of is She tells me her 34 year old mother is being worked up for likely breast cancer.  REVIEW OF SYSTEMS:  A detailed review of systems today was otherwise benign  PAST MEDICAL HISTORY: Past Medical History  Diagnosis Date  . HSV infection   . Psoriatic arthritis ([name])   . History of meningioma 2007    in 2013 stable on MRI and recheck in 5 years  . Abnormal CT of the chest 12/06    numeroous lung nodules - most have resolved.  . Arthritis     psoriatic arthritis  . Cancer [facility])     right breast cancer  . Family history of ovarian cancer   . Family history of leukemia     PAST SURGICAL HISTORY:  Past Surgical History  Procedure Laterality Date  . Thyroidectomy, partial Left 1983  . Breast surgery Left 1971    lumpectomy benign  . Colonoscopy  8/06    normal and recheck in 10 years  . Hammer toe surgery Left 2016  . Breast lumpectomy with radioactive seed and sentinel lymph node biopsy Right 01/22/2016    Procedure: RIGHT BREAST LUMPECTOMY WITH RADIOACTIVE SEED AND SENTINEL LYMPH NODE BIOPSY;  Surgeon: Excell Seltzer, MD;  Location: [facility];  Service: General;  Laterality: Right;    FAMILY HISTORY Family History  Problem Relation Age of Onset  . Osteoporosis Mother   . Osteoporosis Sister   . Lung cancer Maternal Aunt     smoker  . Heart Problems Maternal Grandmother   . COPD Maternal Grandfather   . Neurodegenerative disease Maternal Aunt     corical-ganglion degeneration  . Ovarian cancer Maternal Aunt     dx in her 63s  . Leukemia Cousin     72s-40s; maternal cousin  Samantha Sullivan has essentially no information on her father's side of the family aside from the fact that he died in his 66s from heart disease. He had psoriasis. The patient's mother is living as of January 2017, age 49. The patient had 2 brothers who died from congenital heart disease shortly after birth. The patient has one sister. The patient's mother had one sister, the patient's aunt, diagnosed with ovarian cancer in her 22s.  GYNECOLOGIC HISTORY:  Patient's last menstrual period was 03/22/2000 (approximate). Menarche age 1, first live birth age 89. The patient is GX P2. She went through menopause approximately 1990 but was taking hormone replacement until 01/04/2016. Since coming off replacement she has had insomnia but no hot flashes or vaginal dryness pleural bones so 4. She did take oral contraceptives remotely for more than 10 years with no complications  SOCIAL HISTORY:  Samantha Sullivan works as a Statistician. She is divorced and lives alone, with no pets. Her daughter Samantha Sullivan lives in [place]. She is disabled secondary to schizoaffective disorder. Daughter Samantha Sullivan lives in [place] and she is a Estate agent but mostly a homemaker. The patient has 3 grandchildren, the older is being 78, the other 2 being under 15 years old. The patient attends [employer] DIRECTIVES: her daughter Samantha Sullivan is her healthcare power of attorney. She can be reached at  [place]: Social History  Substance Use Topics  . Smoking status: Former Smoker -- 1.00 packs/day for 12 years    Types: Cigarettes    Quit date: 07/23/1981  . Smokeless tobacco: Never Used  . Alcohol Use: No     Colonoscopy: due 2017; [facility]  ZOX:WRUEAVW 2016/[place]  Bone density:remote  Lipid panel:  Allergies  Allergen Reactions  . Bee Venom   . Norco [Hydrocodone-Acetaminophen] Other (See Comments)    Pt states Norco made her extremely hyper after her surgery and she was not able to sleep.    Current Outpatient Prescriptions  Medication Sig Dispense Refill  . anastrozole (ARIMIDEX) 1 MG tablet Take 1 tablet (1 mg total) by mouth daily. 90 tablet 4  . calcium carbonate 1250 MG capsule Take 1,250 mg by mouth 2 (two) times daily with a meal.    . EPIPEN 2-PAK 0.3 MG/0.3ML SOAJ injection as needed. Reported on 0/98/1191    . folic acid (FOLVITE) 1 MG tablet Take 1 tablet by mouth daily. Reported on 04/01/2016    .  gabapentin (NEURONTIN) 300 MG capsule Take 1 capsule (300 mg total) by mouth at bedtime. 90 capsule 4  . meloxicam (MOBIC) 15 MG tablet Take 15 mg by mouth as needed for pain. Reported on 05/21/2016    . methotrexate (RHEUMATREX) 2.5 MG tablet Take 20 mg by mouth once a week. Reported on 05/21/2016    . Vitamin D, Ergocalciferol, (DRISDOL) 50000 units CAPS capsule Take 50,000 Units by mouth every 7 (seven) days.     Current Facility-Administered Medications  Medication Dose Route Frequency Provider Last Rate Last Dose  . triamcinolone acetonide (KENALOG) 10 MG/ML injection 10 mg  10 mg Other Once Wallene Huh, DPM        OBJECTIVE: middle-aged white woman  In no acute distress  Filed Vitals:   07/10/16 1317  BP: 130/69  Sullivan: 77  Temp: 98.6 F (37 C)  Resp: 18     Body mass index is 29.91 kg/(m^2).    ECOG FS:1 - Symptomatic but completely ambulatory  Sclerae unicteric, EOMs intact Oropharynx clear and moist No cervical or  supraclavicular adenopathy Lungs no rales or rhonchi Heart regular rate and rhythm Abd soft, nontender, positive bowel sounds MSK no focal spinal tenderness, no upper extremity lymphedema Neuro: nonfocal, well oriented, appropriate affect Breasts: The right breast is status post lumpectomy and radiation. The cosmetic result is excellent. There is no evidence of local recurrence. The right axilla is benign. The left breast is unremarkable.     LAB RESULTS:  CMP     Component Value Date/Time   NA 139 01/15/2016 1554   K 4.2 01/15/2016 1554   CO2 27 01/15/2016 1554   GLUCOSE 90 01/15/2016 1554   BUN 13.4 01/15/2016 1554   CREATININE 0.8 01/15/2016 1554   CALCIUM 9.5 01/15/2016 1554   PROT 7.1 01/15/2016 1554   ALBUMIN 4.1 01/15/2016 1554   AST 26 01/15/2016 1554   ALT 23 01/15/2016 1554   ALKPHOS 147 01/15/2016 1554   BILITOT 0.53 01/15/2016 1554    INo results found for: SPEP, UPEP  Lab Results  Component Value Date   WBC 9.9 07/10/2016   NEUTROABS 8.2* 07/10/2016   HGB 14.1 07/10/2016   HCT 40.3 07/10/2016   MCV 88.2 07/10/2016   PLT 240 07/10/2016      Chemistry      Component Value Date/Time   NA 139 01/15/2016 1554   K 4.2 01/15/2016 1554   CO2 27 01/15/2016 1554   BUN 13.4 01/15/2016 1554   CREATININE 0.8 01/15/2016 1554      Component Value Date/Time   CALCIUM 9.5 01/15/2016 1554   ALKPHOS 147 01/15/2016 1554   AST 26 01/15/2016 1554   ALT 23 01/15/2016 1554   BILITOT 0.53 01/15/2016 1554       No results found for: LABCA2  No components found for: LABCA125  No results for input(s): INR in the last 168 hours.  Urinalysis    Component Value Date/Time   BILIRUBINUR neg 12/11/2015 0925   PROTEINUR neg 12/11/2015 0925   UROBILINOGEN negative 12/11/2015 0925   NITRITE neg 12/11/2015 0925   LEUKOCYTESUR Negative 12/11/2015 0925    STUDIES: No results found.   ASSESSMENT: 65 y.o. [place] woman status post right breast upper outer  quadrant biopsy 01/02/2016 for a clinical T2 N0, stage IIA invasive ductal carcinoma, grade 1, estrogen and progesterone receptor positive, HER-2 not amplified, with an MIB-1 of 10%.  (1). Right lumpectomy and sentinel lymph node sampling 01/22/2016 showed a pT1c pN0,  stage IA invasive ductal carcinoma, grade 1, with close but negative margins. Repeat HER-2 testing was again not amplified   (3) Oncotype score of 18 projects a risk of recurrence outside the breast within the next 10 years of 11% if the patient's only systemic therapy is tamoxifen for 5 years. It also predicts a very marginal benefit from chemotherapy.   (a) The patient opted against adjuvant chemotherapy; that is also our recommendation   (4) adjuvant radiation completed 04/01/2016  (5)  Anastrozole to start 04/21/2016  (a)  Bone density at [facility] September 2013 was normal   (6) genetics testing  02/24/2016 through the Breast/Ovarian cancer gene panel offered by [facility]  Found no deleterious mutations in ATM, BARD1, BRCA1, BRCA2, BRIP1, CDH1, CHEK2, EPCAM, FANCC, MLH1, MSH2, MSH6, NBN, PALB2, PMS2, PTEN, RAD51C, RAD51D, TP53, and XRCC2.   PLAN:  Samantha Sullivan is having some symptoms from the anastrozole but she tolerates them well and she feels she can take this drug for at least 2 years if not 5. That accordingly will be the plan.  We discussed her mother's situation. If her mother's tumor is in the breast cancer as it seems to be and if it is estrogen receptor positive, possibly anti-estrogens alone might be sufficient given that she is 87. I suggested she request that appointment with Dr. Whitney Muse our partner in [facility] who would be I am sure glad to see her.  Otherwise Samantha Sullivan is going to see me again late January after her January mammography. She knows to call for any problems that may develop before that visit.   Chauncey Cruel, MD   07/10/2016 1:37 PM Medical Oncology and Hematology [facility] [address] [place], [place] [address] Tel. [phone]    Fax. [phone]

## 2016-10-01 DIAGNOSIS — C50911 Malignant neoplasm of unspecified site of right female breast: Secondary | ICD-10-CM | POA: Diagnosis not present

## 2016-11-07 ENCOUNTER — Telehealth: Payer: Self-pay | Admitting: *Deleted

## 2016-11-07 NOTE — Telephone Encounter (Signed)
Received call from patient stating she has started to have significant joint pain and is effecting her when she is exercising. Instructed her to stop taking the anastrozole until she sees Dr. Jana Hakim in January to re-evaluate. Patient verbalized understanding.

## 2016-11-24 ENCOUNTER — Telehealth: Payer: Self-pay | Admitting: *Deleted

## 2016-11-24 NOTE — Telephone Encounter (Signed)
Received call from patient stating she has felt some skin thickening in her right breast to the left of her nipple and feels some pain and tenderness under her arm.  Per Dr. Jana Hakim we will get her scheduled for mammo/us.  Appointment made at [facility] for mammo/us for 11/28/16 at 745am.   Patient aware of appointment.

## 2016-11-28 DIAGNOSIS — Z853 Personal history of malignant neoplasm of breast: Secondary | ICD-10-CM | POA: Diagnosis not present

## 2016-12-09 DIAGNOSIS — G4709 Other insomnia: Secondary | ICD-10-CM | POA: Diagnosis not present

## 2016-12-09 DIAGNOSIS — R748 Abnormal levels of other serum enzymes: Secondary | ICD-10-CM | POA: Diagnosis not present

## 2016-12-09 DIAGNOSIS — Z0000 Encounter for general adult medical examination without abnormal findings: Secondary | ICD-10-CM | POA: Diagnosis not present

## 2016-12-12 DIAGNOSIS — H9193 Unspecified hearing loss, bilateral: Secondary | ICD-10-CM | POA: Diagnosis not present

## 2016-12-12 DIAGNOSIS — Z23 Encounter for immunization: Secondary | ICD-10-CM | POA: Diagnosis not present

## 2016-12-12 DIAGNOSIS — G4709 Other insomnia: Secondary | ICD-10-CM | POA: Diagnosis not present

## 2016-12-12 DIAGNOSIS — M545 Low back pain: Secondary | ICD-10-CM | POA: Diagnosis not present

## 2016-12-12 DIAGNOSIS — Z1389 Encounter for screening for other disorder: Secondary | ICD-10-CM | POA: Diagnosis not present

## 2016-12-12 DIAGNOSIS — Z6830 Body mass index (BMI) 30.0-30.9, adult: Secondary | ICD-10-CM | POA: Diagnosis not present

## 2016-12-12 DIAGNOSIS — L4050 Arthropathic psoriasis, unspecified: Secondary | ICD-10-CM | POA: Diagnosis not present

## 2016-12-12 DIAGNOSIS — Z0000 Encounter for general adult medical examination without abnormal findings: Secondary | ICD-10-CM | POA: Diagnosis not present

## 2016-12-12 DIAGNOSIS — C50411 Malignant neoplasm of upper-outer quadrant of right female breast: Secondary | ICD-10-CM | POA: Diagnosis not present

## 2016-12-12 DIAGNOSIS — R748 Abnormal levels of other serum enzymes: Secondary | ICD-10-CM | POA: Diagnosis not present

## 2016-12-12 DIAGNOSIS — M19079 Primary osteoarthritis, unspecified ankle and foot: Secondary | ICD-10-CM | POA: Diagnosis not present

## 2016-12-12 DIAGNOSIS — I8390 Asymptomatic varicose veins of unspecified lower extremity: Secondary | ICD-10-CM | POA: Diagnosis not present

## 2016-12-17 DIAGNOSIS — Z1212 Encounter for screening for malignant neoplasm of rectum: Secondary | ICD-10-CM | POA: Diagnosis not present

## 2016-12-31 ENCOUNTER — Ambulatory Visit (INDEPENDENT_AMBULATORY_CARE_PROVIDER_SITE_OTHER): Payer: PPO | Admitting: Nurse Practitioner

## 2016-12-31 ENCOUNTER — Encounter: Payer: Self-pay | Admitting: Nurse Practitioner

## 2016-12-31 VITALS — BP 122/84 | HR 64 | Resp 15 | Ht 64.5 in | Wt 191.0 lb

## 2016-12-31 DIAGNOSIS — Z01411 Encounter for gynecological examination (general) (routine) with abnormal findings: Secondary | ICD-10-CM | POA: Diagnosis not present

## 2016-12-31 DIAGNOSIS — C50411 Malignant neoplasm of upper-outer quadrant of right female breast: Secondary | ICD-10-CM

## 2016-12-31 DIAGNOSIS — Z124 Encounter for screening for malignant neoplasm of cervix: Secondary | ICD-10-CM | POA: Diagnosis not present

## 2016-12-31 DIAGNOSIS — Z170 Estrogen receptor positive status [ER+]: Secondary | ICD-10-CM

## 2016-12-31 DIAGNOSIS — E559 Vitamin D deficiency, unspecified: Secondary | ICD-10-CM

## 2016-12-31 DIAGNOSIS — Z0000 Encounter for general adult medical examination without abnormal findings: Secondary | ICD-10-CM

## 2016-12-31 NOTE — Progress Notes (Addendum)
Patient ID: Samantha Sullivan, female   DOB: Jul 22, 1951, 66 y.o.   MRN: [id number]  66 y.o. [id number] Widowed  Caucasian Fe here for annual exam.    Patient's last menstrual period was 03/22/2000 (approximate).          Sexually active: No.  The current method of family planning is post menopausal status.    Exercising: Yes.    walking and pilates  Smoker:  no  Health Maintenance: Pap:12/11/15, Negative MMG:12/27/15, Breast Cancer, see EPIC Colonoscopy: 07/2005 negative, repeat in 10 years(due) BMD:08/31/12 T Score, 1.2 Spine / 0.7 Right Femur Neck / 1.0 Left Femur Neck  TDaP:07/15/06 Shingles: Not a candidate due to having shingles and taking Methotrexate Pneumonia: 07/27/09 Hep C: 12/11/15 HIV: Not indicated due to age Labs: PCP does labs    reports that she quit smoking about 35 years ago. Her smoking use included Cigarettes. She has a 12.00 pack-year smoking history. She has never used smokeless tobacco. She reports that she does not drink alcohol or use drugs.  Past Medical History:  Diagnosis Date  . Abnormal CT of the chest 12/06   numeroous lung nodules - most have resolved.  . Arthritis    psoriatic arthritis  . Cancer [facility])    right breast cancer  . Family history of leukemia   . Family history of ovarian cancer   . History of meningioma 2007   in 2013 stable on MRI and recheck in 5 years  . HSV infection   . Psoriatic arthritis [facility])     Past Surgical History:  Procedure Laterality Date  . BREAST LUMPECTOMY WITH RADIOACTIVE SEED AND SENTINEL LYMPH NODE BIOPSY Right 01/22/2016   Procedure: RIGHT BREAST LUMPECTOMY WITH RADIOACTIVE SEED AND SENTINEL LYMPH NODE BIOPSY;  Surgeon: Excell Seltzer, MD;  Location: [facility];  Service: General;  Laterality: Right;  . BREAST SURGERY Left 1971   lumpectomy benign  . COLONOSCOPY  8/06   normal and recheck in 10 years  . HAMMER TOE SURGERY Left 2016  . THYROIDECTOMY, PARTIAL Left 1983    No current  outpatient prescriptions on file.   Current Facility-Administered Medications  Medication Dose Route Frequency Provider Last Rate Last Dose  . triamcinolone acetonide (KENALOG) 10 MG/ML injection 10 mg  10 mg Other Once Wallene Huh, DPM        Family History  Problem Relation Age of Onset  . Osteoporosis Mother   . Osteoporosis Sister   . Lung cancer Maternal Aunt     smoker  . Heart Problems Maternal Grandmother   . COPD Maternal Grandfather   . Neurodegenerative disease Maternal Aunt     corical-ganglion degeneration  . Ovarian cancer Maternal Aunt     dx in her 36s  . Leukemia Cousin     30s-40s; maternal cousin    ROS:  Pertinent items are noted in HPI.  Otherwise, a comprehensive ROS was negative.  Exam:   BP 122/84 (BP Location: Left Arm, Patient Position: Sitting, Cuff Size: Normal)   Pulse 64   Resp 15   Ht 5' 4.5" (1.638 m)   Wt 191 lb (86.6 kg)   LMP 03/22/2000 (Approximate)   BMI 32.28 kg/m  Height: 5' 4.5" (163.8 cm) Ht Readings from Last 3 Encounters:  12/31/16 5' 4.5" (1.638 m)  05/21/16 5\' 6"  (1.676 m)  05/01/16 5\' 4"  (1.626 m)    General appearance: alert, cooperative and appears stated age Head: Normocephalic, without obvious abnormality, atraumatic Neck: no adenopathy, supple,  symmetrical, trachea midline and thyroid normal to inspection and palpation Lungs: clear to auscultation bilaterally Breasts: normal appearance, no masses or tenderness, on the left.  On the right there are surgical and radiation changes.  No new mass or nodes. Heart: regular rate and rhythm Abdomen: soft, non-tender; no masses,  no organomegaly Extremities: extremities normal, atraumatic, no cyanosis or edema Skin: Skin color, texture, turgor normal. No rashes or lesions Lymph nodes: Cervical, supraclavicular, and axillary nodes normal. No abnormal inguinal nodes palpated Neurologic: Grossly normal   Pelvic: External genitalia:  no lesions              Urethra:   normal appearing urethra with no masses, tenderness or lesions              Bartholin's and Skene's: normal                 Vagina: normal appearing vagina with normal color and discharge, no lesions              Cervix: anteverted              Pap taken: Yes.   Bimanual Exam:  Uterus:  normal size, contour, position, consistency, mobility, non-tender              Adnexa: no mass, fullness, tenderness               Rectovaginal: Confirms               Anus:  normal sphincter tone, no lesions  Chaperone present: yes  A:  Well Woman with normal exam  Postmenopausal on HRT since 04/1999 and stopped 12/27/2015  New diagnosis of breast cancer on right with lumpectomy 01/22/16, and radiation.  ER/PR+, HER 2 negative History of Meningioma brain History of Psoriatic arthritis - currently off Methotrexate Vit D deficiency             Recent emotional trauma with mothers new diagnosis of breast cancer              P:   Reviewed health and wellness pertinent to exam  Pap smear as above  Mammogram is due 11/2017  Follow with labs and pap  Counseled on breast self exam, mammography screening, adequate intake of calcium and vitamin D, diet and exercise, Kegel's exercises return annually or prn  An After Visit Summary was printed and given to the patient.

## 2016-12-31 NOTE — Patient Instructions (Signed)

## 2017-01-01 LAB — IPS PAP SMEAR ONLY

## 2017-01-01 LAB — VITAMIN D 25 HYDROXY (VIT D DEFICIENCY, FRACTURES): VIT D 25 HYDROXY: 35 ng/mL (ref 30–100)

## 2017-01-04 ENCOUNTER — Encounter: Payer: Self-pay | Admitting: Nurse Practitioner

## 2017-01-04 NOTE — Progress Notes (Signed)
Encounter reviewed by Dr. Aundria Rud. 11/28/16 - bilateral dx mammogram and right breast ultrasound - no sign of malignancy.  Next mammogram in one year.

## 2017-01-12 ENCOUNTER — Telehealth: Payer: Self-pay | Admitting: Oncology

## 2017-01-12 NOTE — Telephone Encounter (Signed)
Patient called to reschedule appointment per she feels like she has the flu.

## 2017-01-14 ENCOUNTER — Ambulatory Visit: Payer: PPO | Admitting: Oncology

## 2017-01-14 ENCOUNTER — Other Ambulatory Visit: Payer: PPO

## 2017-01-27 DIAGNOSIS — Z0000 Encounter for general adult medical examination without abnormal findings: Secondary | ICD-10-CM | POA: Diagnosis not present

## 2017-01-28 ENCOUNTER — Telehealth: Payer: Self-pay | Admitting: Oncology

## 2017-01-28 NOTE — Telephone Encounter (Signed)
Called patient and confirmed her of the appointment changes

## 2017-02-03 ENCOUNTER — Other Ambulatory Visit: Payer: Self-pay | Admitting: *Deleted

## 2017-02-03 DIAGNOSIS — C50411 Malignant neoplasm of upper-outer quadrant of right female breast: Secondary | ICD-10-CM

## 2017-02-04 ENCOUNTER — Ambulatory Visit (HOSPITAL_BASED_OUTPATIENT_CLINIC_OR_DEPARTMENT_OTHER): Payer: PPO | Admitting: Oncology

## 2017-02-04 ENCOUNTER — Other Ambulatory Visit (HOSPITAL_BASED_OUTPATIENT_CLINIC_OR_DEPARTMENT_OTHER): Payer: PPO

## 2017-02-04 VITALS — BP 125/67 | HR 74 | Temp 98.6°F | Resp 18 | Ht 64.5 in | Wt 189.5 lb

## 2017-02-04 DIAGNOSIS — C50411 Malignant neoplasm of upper-outer quadrant of right female breast: Secondary | ICD-10-CM

## 2017-02-04 DIAGNOSIS — Z170 Estrogen receptor positive status [ER+]: Secondary | ICD-10-CM | POA: Diagnosis not present

## 2017-02-04 LAB — COMPREHENSIVE METABOLIC PANEL
ALBUMIN: 3.6 g/dL (ref 3.5–5.0)
ALT: 12 U/L (ref 0–55)
ANION GAP: 9 meq/L (ref 3–11)
AST: 20 U/L (ref 5–34)
Alkaline Phosphatase: 185 U/L — ABNORMAL HIGH (ref 40–150)
BILIRUBIN TOTAL: 0.43 mg/dL (ref 0.20–1.20)
BUN: 12.8 mg/dL (ref 7.0–26.0)
CALCIUM: 9.6 mg/dL (ref 8.4–10.4)
CO2: 27 mEq/L (ref 22–29)
CREATININE: 0.8 mg/dL (ref 0.6–1.1)
Chloride: 104 mEq/L (ref 98–109)
EGFR: 80 mL/min/{1.73_m2} — ABNORMAL LOW (ref >90)
Glucose: 82 mg/dl (ref 70–140)
Potassium: 3.9 mEq/L (ref 3.5–5.1)
Sodium: 140 mEq/L (ref 136–145)
TOTAL PROTEIN: 6.6 g/dL (ref 6.4–8.3)

## 2017-02-04 LAB — CBC & DIFF AND RETIC
BASO%: 0.2 % (ref 0.0–2.0)
BASOS ABS: 0 10*3/uL (ref 0.0–0.1)
EOS ABS: 0.1 10*3/uL (ref 0.0–0.5)
EOS%: 1.3 % (ref 0.0–7.0)
HEMATOCRIT: 38.6 % (ref 34.8–46.6)
HEMOGLOBIN: 13.4 g/dL (ref 11.6–15.9)
IMMATURE RETIC FRACT: 1 % — AB (ref 1.60–10.00)
LYMPH#: 1.6 10*3/uL (ref 0.9–3.3)
LYMPH%: 26.3 % (ref 14.0–49.7)
MCH: 30.8 pg (ref 25.1–34.0)
MCHC: 34.7 g/dL (ref 31.5–36.0)
MCV: 88.7 fL (ref 79.5–101.0)
MONO#: 0.4 10*3/uL (ref 0.1–0.9)
MONO%: 7.2 % (ref 0.0–14.0)
NEUT#: 3.9 10*3/uL (ref 1.5–6.5)
NEUT%: 65 % (ref 38.4–76.8)
PLATELETS: 215 10*3/uL (ref 145–400)
RBC: 4.35 10*6/uL (ref 3.70–5.45)
RDW: 13.4 % (ref 11.2–14.5)
RETIC CT ABS: 66.56 10*3/uL (ref 33.70–90.70)
Retic %: 1.53 % (ref 0.70–2.10)
WBC: 6 10*3/uL (ref 3.9–10.3)

## 2017-02-04 NOTE — Progress Notes (Signed)
Dear  [facility]  [phone] [phone]     ID: Samantha Sullivan DOB: Dec 03, 1951  MR#: [id number]  [facility]#:[id number]  Patient Care Team: Samantha Baton, MD as PCP - General (Internal Medicine) Samantha Cruel, MD as Consulting Physician (Oncology) Samantha Boroughs, FNP as Nurse Practitioner (Family Medicine) Samantha [facility], DPM as Consulting Physician (Podiatry) Samantha Seltzer, MD as Consulting Physician (General Surgery) Samantha Silversmith, MD (Inactive) as Consulting Physician (Radiation Oncology) Samantha Cheese, NP as Nurse Practitioner (Hematology and Oncology) PCP: Samantha Reel, MD OTHER MD:  CHIEF COMPLAINT: Estrogen receptor positive breast cancer  CURRENT TREATMENT: Anastrozole   BREAST CANCER HISTORY:  From the original intake note:  "Samantha Sullivan"noted a lump in her right breast sometime in late October or early November, but there were "so many things going on in her life" she did not bring it to medical attention until she saw [facility] 12/11/2015. Ms Samantha Sullivan was able to palpate a mass at the 9:30 o'clock position in the right breast, which was mobile and nontender. The left breast is status post prior remote biopsy and there was some scar tissue at the 7:00 position. The patient was set up for bilateral diagnostic Mammography with tomosynthesis at [facility] 12/27/2015. This found the breast density to be category C. There was a new oval mass in the right breast upper outer quadrant. Ultrasound the same day confirmed a 2.2 cm mass with irregular margins in the right breast at the 9:00 position read as highly suggestive of malignancy.  Biopsy of the right breast mass in question 01/02/2016 found ([id number]) an invasive ductal carcinoma, grade 1, estrogen receptor 100% positive, progesterone receptor 90% positive, both with strong staining intensity, with an MIB-1 of 10%, and no HER-2 amplification, the signals ratio being 1.32 and the number per cell  2.05. The patient was informed and instructed to stop hormone replacement therapy (last dose 01/04/2016).  Her subsequent history is as detailed below.   INTERVAL HISTORY: Samantha Sullivan returns today for follow-up of her estrogen receptor positive breast cancer. She had been on anastrozole on and off but finally went off it in May 2017. She tells me she immediately felt better, just 1 day later. She continued to feel well for about 3 weeks, then the symptoms of aches and pains returned, lasted about 3 weeks, and then permanently went away. She has not restarted the medication.  She called [place] in December with some concerns regarding her surgical breast. We set her up for bilateral diagnostic mammography with tomosynthesis and   , performed 11/28/2016. The breast density was category C. There were some postoperative changes noted. There were no significant findings and right breast ultrasonography was unremarkable  REVIEW OF SYSTEMS: Samantha Sullivan walks 5 miles most days in the week. She still has some discomfort in the surgical breast. She has significant insomnia problems which are long-standing. Aside from these issues a detailed review of systems was noncontributory  PAST MEDICAL HISTORY: Past Medical History:  Diagnosis Date  . Abnormal CT of the chest 12/06   numeroous lung nodules - most have resolved.  . Arthritis    psoriatic arthritis  . Cancer [facility])    right breast cancer  . Family history of leukemia   . Family history of ovarian cancer   . History of meningioma 2007   in 2013 stable on MRI and recheck in 5 years  . HSV infection   . Psoriatic arthritis (Samantha Sullivan)     PAST SURGICAL HISTORY: Past  Surgical History:  Procedure Laterality Date  . BREAST LUMPECTOMY WITH RADIOACTIVE SEED AND SENTINEL LYMPH NODE BIOPSY Right 01/22/2016   Procedure: RIGHT BREAST LUMPECTOMY WITH RADIOACTIVE SEED AND SENTINEL LYMPH NODE BIOPSY;  Surgeon: Samantha Seltzer, MD;  Location: [facility];   Service: General;  Laterality: Right;  . BREAST SURGERY Left 1971   lumpectomy benign  . COLONOSCOPY  8/06   normal and recheck in 10 years  . HAMMER TOE SURGERY Left 2016  . THYROIDECTOMY, PARTIAL Left 1983    FAMILY HISTORY Family History  Problem Relation Age of Onset  . Osteoporosis Mother   . Osteoporosis Sister   . Lung cancer Maternal Aunt     smoker  . Heart Problems Maternal Grandmother   . COPD Maternal Grandfather   . Neurodegenerative disease Maternal Aunt     corical-ganglion degeneration  . Ovarian cancer Maternal Aunt     dx in her 71s  . Leukemia Cousin     56s-40s; maternal cousin  Samantha Sullivan has essentially no information on her father's side of the family aside from the fact that he died in his 4s from heart disease. He had psoriasis. The patient's mother is living as of January 2017, age 70. The patient had 2 brothers who died from congenital heart disease shortly after birth. The patient has one sister. The patient's mother had one sister, the patient's aunt, diagnosed with ovarian cancer in her 66s.  GYNECOLOGIC HISTORY:  Patient's last menstrual period was 03/22/2000 (approximate). Menarche age 73, first live birth age 53. The patient is GX P2. She went through menopause approximately 1990 but was taking hormone replacement until 01/04/2016. Since coming off replacement she has had insomnia but no hot flashes or vaginal dryness pleural bones so 4. She did take oral contraceptives remotely for more than 10 years with no complications  SOCIAL HISTORY:  Samantha Sullivan works as a Statistician. She is divorced and lives alone, with no pets. Her daughter Samantha Sullivan lives in [place]. She is disabled secondary to schizoaffective disorder. Daughter Samantha Sullivan "Samantha Sullivan" Samantha Sullivan lives in [place] and she is a Estate agent but mostly a homemaker. The patient has 3 grandchildren, the older is being 75, the other 2 being under 40 years old. The  patient attends [employer] DIRECTIVES: her daughter Samantha Sullivan is her healthcare power of attorney. She can be reached at Samantha Sullivan: Social History  Substance Use Topics  . Smoking status: Former Smoker    Packs/day: 1.00    Years: 12.00    Types: Cigarettes    Quit date: 07/23/1981  . Smokeless tobacco: Never Used  . Alcohol use No     Colonoscopy: ; [facility]  ZOX:WRUEAVW 2016/[facility]  Bone density:rJanuary 2018/normal   Lipid panel:  Allergies  Allergen Reactions  . Bee Venom   . Norco [Hydrocodone-Acetaminophen] Other (See Comments)    Pt states Norco made her extremely hyper after her surgery and she was not able to sleep.    No current outpatient prescriptions on file.   Current Facility-Administered Medications  Medication Dose Route Frequency Provider Last Rate Last Dose  . triamcinolone acetonide (KENALOG) 10 MG/ML injection 10 mg  10 mg Other Once Samantha Sullivan, DPM        OBJECTIVE: middle-aged white woman Who appears well Vitals:   02/04/17 1516  BP: 125/67  Sullivan: 74  Resp: 18  Temp: 98.6 F (37 C)     Body mass  index is 32.03 kg/m.    ECOG FS:1 - Symptomatic but completely ambulatory  Sclerae unicteric, pupils round and equal Oropharynx clear and moist-- no thrush or other lesions No cervical or supraclavicular adenopathy Lungs no rales or rhonchi Heart regular rate and rhythm Abd soft, nontender, positive bowel sounds MSK no focal spinal tenderness, no upper extremity lymphedema Neuro: nonfocal, well oriented, appropriate affect Breasts: The right breast has undergone lumpectomy followed by radiation, with no evidence of local recurrence. The right axilla is benign. The left breast is unremarkable   LAB RESULTS:  CMP     Component Value Date/Time   NA 140 07/10/2016 1306   K 4.3 07/10/2016 1306   CO2 27 07/10/2016 1306   GLUCOSE 113 07/10/2016 1306   BUN 20.8 07/10/2016 1306   CREATININE 0.9 07/10/2016  1306   CALCIUM 9.8 07/10/2016 1306   PROT 7.2 07/10/2016 1306   ALBUMIN 3.8 07/10/2016 1306   AST 22 07/10/2016 1306   ALT 13 07/10/2016 1306   ALKPHOS 182 (H) 07/10/2016 1306   BILITOT 0.59 07/10/2016 1306    INo results found for: SPEP, UPEP  Lab Results  Component Value Date   WBC 6.0 02/04/2017   NEUTROABS 3.9 02/04/2017   HGB 13.4 02/04/2017   HCT 38.6 02/04/2017   MCV 88.7 02/04/2017   PLT 215 02/04/2017      Chemistry      Component Value Date/Time   NA 140 07/10/2016 1306   K 4.3 07/10/2016 1306   CO2 27 07/10/2016 1306   BUN 20.8 07/10/2016 1306   CREATININE 0.9 07/10/2016 1306      Component Value Date/Time   CALCIUM 9.8 07/10/2016 1306   ALKPHOS 182 (H) 07/10/2016 1306   AST 22 07/10/2016 1306   ALT 13 07/10/2016 1306   BILITOT 0.59 07/10/2016 1306       No results found for: LABCA2  No components found for: LABCA125  No results for input(s): INR in the last 168 hours.  Urinalysis    Component Value Date/Time   BILIRUBINUR neg 12/11/2015 0925   PROTEINUR neg 12/11/2015 0925   UROBILINOGEN negative 12/11/2015 0925   NITRITE neg 12/11/2015 0925   LEUKOCYTESUR Negative 12/11/2015 0925    STUDIES: Outside studies reviewed  ASSESSMENT: 66 y.o. [place] woman status post right breast upper outer quadrant biopsy 01/02/2016 for a clinical T2 N0, stage IIA invasive ductal carcinoma, grade 1, estrogen and progesterone receptor positive, HER-2 not amplified, with an MIB-1 of 10%.  (1). Right lumpectomy and sentinel lymph node sampling 01/22/2016 showed a pT1c pN0, stage IA invasive ductal carcinoma, grade 1, with close but negative margins. Repeat HER-2 testing was again not amplified   (3) Oncotype score of 18 projects a risk of recurrence outside the breast within the next 10 years of 11% if the patient's only systemic therapy is tamoxifen for 5 years. It also predicts a very marginal benefit from chemotherapy.   (a) The patient opted against  adjuvant chemotherapy; that is also our recommendation   (4) adjuvant radiation completed 04/01/2016  (5)  Anastrozole started 04/21/2016, discontinued after approximately 1 month with significant side effects  (a)  Bone density at [place] September 2013 was normal  (b) bone density January 2018 was normal  (c anastrozole restarted February 2018)    (6) genetics testing  02/24/2016 through the Breast/Ovarian cancer gene panel offered by [facility] found no deleterious mutations in ATM, BARD1, BRCA1, BRCA2, BRIP1, CDH1, CHEK2, EPCAM, FANCC, MLH1, MSH2, MSH6, NBN,  PALB2, PMS2, PTEN, RAD51C, RAD51D, TP53, and XRCC2.   PLAN:  Samantha Sullivan is willing to give anastrozole a try since she has so many of the pills left. However we discussed the alternatives. These chiefly include tamoxifen versus exemestane. We talked about the possible side effects toxicities and complications of these agents. She has deterred by the possibility of what sounds and endometrial carcinoma with tamoxifen so if we are going to make a change it would be to exemestane.  I suggested she go ahead and give anastrozole a try. She will call in 1 month just to let us know how she is doing. If she goes 3 months on anastrozole without significant problems than any problems that develop later are unlikely to be W to that medication.  In any case I'm going to see her again in 6 months. She knows to call for any problems that may develop before the next visit.   Samantha Cruel, MD   02/04/2017 3:44 PM Medical Oncology and Hematology [facility] [address] [place], [place] [address] Tel. [phone]    Fax. [phone]

## 2017-03-25 ENCOUNTER — Encounter (HOSPITAL_COMMUNITY): Payer: Self-pay

## 2017-04-29 DIAGNOSIS — D320 Benign neoplasm of cerebral meninges: Secondary | ICD-10-CM | POA: Diagnosis not present

## 2017-04-29 DIAGNOSIS — D329 Benign neoplasm of meninges, unspecified: Secondary | ICD-10-CM | POA: Diagnosis not present

## 2017-05-01 DIAGNOSIS — Z6831 Body mass index (BMI) 31.0-31.9, adult: Secondary | ICD-10-CM | POA: Diagnosis not present

## 2017-05-01 DIAGNOSIS — D329 Benign neoplasm of meninges, unspecified: Secondary | ICD-10-CM | POA: Diagnosis not present

## 2017-05-13 DIAGNOSIS — H5213 Myopia, bilateral: Secondary | ICD-10-CM | POA: Diagnosis not present

## 2017-05-22 DIAGNOSIS — C50911 Malignant neoplasm of unspecified site of right female breast: Secondary | ICD-10-CM | POA: Diagnosis not present

## 2017-07-24 ENCOUNTER — Telehealth: Payer: Self-pay | Admitting: Obstetrics and Gynecology

## 2017-07-24 NOTE — Telephone Encounter (Signed)
Left message regarding upcoming appointment has been canceled and needs to be rescheduled. °

## 2017-07-28 ENCOUNTER — Ambulatory Visit (INDEPENDENT_AMBULATORY_CARE_PROVIDER_SITE_OTHER): Payer: PPO | Admitting: Physician Assistant

## 2017-07-28 ENCOUNTER — Encounter: Payer: Self-pay | Admitting: Physician Assistant

## 2017-07-28 VITALS — BP 123/84 | HR 68 | Temp 98.8°F | Resp 17 | Ht 65.5 in | Wt 194.0 lb

## 2017-07-28 DIAGNOSIS — Z23 Encounter for immunization: Secondary | ICD-10-CM

## 2017-07-28 NOTE — Progress Notes (Signed)
PRIMARY CARE AT [facility] [address] [place] [address] [phone]  Date:  07/28/2017   Name:  Samantha Sullivan   DOB:  06/20/1951   MRN:  [id number]  PCP:  Shon Baton, MD    History of Present Illness:  Samantha Sullivan is a 66 y.o. female patient who presents to PCP with  Chief Complaint  Patient presents with  . Immunizations    TDAP     Patient needs tdap.  She requests this today.  No hx of adverse side effects with tdap.  Patient Active Problem List   Diagnosis Date Noted  . Genetic testing 02/26/2016  . Family history of ovarian cancer   . Family history of leukemia   . Psoriatic arthritis ([place]) 01/15/2016  . Meningioma ([place]) 01/15/2016  . Malignant neoplasm of upper-outer quadrant of right breast in female, estrogen receptor positive ([place]) 01/04/2016    Past Medical History:  Diagnosis Date  . Abnormal CT of the chest 12/06   numeroous lung nodules - most have resolved.  . Arthritis    psoriatic arthritis  . Cancer [facility])    right breast cancer  . Family history of leukemia   . Family history of ovarian cancer   . History of meningioma 2007   in 2013 stable on MRI and recheck in 5 years  . HSV infection   . Psoriatic arthritis [facility])     Past Surgical History:  Procedure Laterality Date  . BREAST LUMPECTOMY WITH RADIOACTIVE SEED AND SENTINEL LYMPH NODE BIOPSY Right 01/22/2016   Procedure: RIGHT BREAST LUMPECTOMY WITH RADIOACTIVE SEED AND SENTINEL LYMPH NODE BIOPSY;  Surgeon: Excell Seltzer, MD;  Location: [place];  Service: General;  Laterality: Right;  . BREAST SURGERY Left 1971   lumpectomy benign  . COLONOSCOPY  8/06   normal and recheck in 10 years  . HAMMER TOE SURGERY Left 2016  . THYROIDECTOMY, PARTIAL Left 1983    Social History  Substance Use Topics  . Smoking status: Former Smoker    Packs/day: 1.00    Years: 12.00    Types: Cigarettes    Quit date: 07/23/1981  . Smokeless tobacco: Never Used  . Alcohol use No     Family History  Problem Relation Age of Onset  . Osteoporosis Mother   . Osteoporosis Sister   . Lung cancer Maternal Aunt        smoker  . Heart Problems Maternal Grandmother   . COPD Maternal Grandfather   . Neurodegenerative disease Maternal Aunt        corical-ganglion degeneration  . Ovarian cancer Maternal Aunt        dx in her 96s  . Leukemia Cousin        30s-40s; maternal cousin    Allergies  Allergen Reactions  . Bee Venom   . Norco [Hydrocodone-Acetaminophen] Other (See Comments)    Pt states Norco made her extremely hyper after her surgery and she was not able to sleep.    Medication list has been reviewed and updated.  No current outpatient prescriptions on file prior to visit.   Current Facility-Administered Medications on File Prior to Visit  Medication Dose Route Frequency Provider Last Rate Last Dose  . triamcinolone acetonide (KENALOG) 10 MG/ML injection 10 mg  10 mg Other Once Wallene Huh, DPM        ROS ROS otherwise unremarkable unless listed above.  Physical Examination: BP 123/84   Pulse 68   Temp 98.8 F (37.1  C) (Oral)   Resp 17   Ht 5' 5.5" (1.664 m)   Wt 194 lb (88 kg)   LMP 03/22/2000 (Approximate)   SpO2 98%   BMI 31.79 kg/m  Ideal Body Weight: Weight in (lb) to have BMI = 25: 152.2  Physical Exam  Constitutional: She appears well-developed and well-nourished. No distress.  HENT:  Head: Normocephalic.  Eyes: Conjunctivae are normal.  Pulmonary/Chest: Effort normal. No respiratory distress.     Assessment and Plan: Samantha Sullivan is a 66 y.o. female who is here today for cc of tdap. Need for Tdap vaccination  Ivar Drape, PA-C [facility] 8/7/20188:08 PM

## 2017-07-28 NOTE — Patient Instructions (Signed)
     IF you received an x-ray today, you will receive an invoice from [facility]. Please contact  Radiology at [phone] with questions or concerns regarding your invoice.   IF you received labwork today, you will receive an invoice from [facility]. Please contact [facility] at [phone] with questions or concerns regarding your invoice.   Our billing staff will not be able to assist you with questions regarding bills from these companies.  You will be contacted with the lab results as soon as they are available. The fastest way to get your results is to activate your My Chart account. Instructions are located on the last page of this paperwork. If you have not heard from us regarding the results in 2 weeks, please contact this office.     

## 2017-08-04 ENCOUNTER — Other Ambulatory Visit: Payer: Self-pay | Admitting: *Deleted

## 2017-08-04 DIAGNOSIS — C50411 Malignant neoplasm of upper-outer quadrant of right female breast: Secondary | ICD-10-CM

## 2017-08-04 DIAGNOSIS — Z170 Estrogen receptor positive status [ER+]: Principal | ICD-10-CM

## 2017-08-05 ENCOUNTER — Other Ambulatory Visit (HOSPITAL_BASED_OUTPATIENT_CLINIC_OR_DEPARTMENT_OTHER): Payer: PPO

## 2017-08-05 ENCOUNTER — Ambulatory Visit (HOSPITAL_BASED_OUTPATIENT_CLINIC_OR_DEPARTMENT_OTHER): Payer: PPO | Admitting: Oncology

## 2017-08-05 VITALS — BP 129/65 | HR 66 | Temp 98.6°F | Resp 18 | Ht 65.5 in | Wt 193.3 lb

## 2017-08-05 DIAGNOSIS — Z79811 Long term (current) use of aromatase inhibitors: Secondary | ICD-10-CM | POA: Diagnosis not present

## 2017-08-05 DIAGNOSIS — C50411 Malignant neoplasm of upper-outer quadrant of right female breast: Secondary | ICD-10-CM

## 2017-08-05 DIAGNOSIS — Z170 Estrogen receptor positive status [ER+]: Secondary | ICD-10-CM

## 2017-08-05 LAB — COMPREHENSIVE METABOLIC PANEL
ALBUMIN: 3.6 g/dL (ref 3.5–5.0)
ALK PHOS: 169 U/L — AB (ref 40–150)
ALT: 12 U/L (ref 0–55)
ANION GAP: 8 meq/L (ref 3–11)
AST: 19 U/L (ref 5–34)
BUN: 13.4 mg/dL (ref 7.0–26.0)
CALCIUM: 10 mg/dL (ref 8.4–10.4)
CHLORIDE: 104 meq/L (ref 98–109)
CO2: 29 mEq/L (ref 22–29)
CREATININE: 0.8 mg/dL (ref 0.6–1.1)
EGFR: 72 mL/min/{1.73_m2} — ABNORMAL LOW (ref >90)
Glucose: 77 mg/dl (ref 70–140)
Potassium: 4.5 mEq/L (ref 3.5–5.1)
Sodium: 141 mEq/L (ref 136–145)
Total Bilirubin: 0.63 mg/dL (ref 0.20–1.20)
Total Protein: 6.8 g/dL (ref 6.4–8.3)

## 2017-08-05 LAB — CBC WITH DIFFERENTIAL/PLATELET
BASO%: 0.6 % (ref 0.0–2.0)
BASOS ABS: 0 10*3/uL (ref 0.0–0.1)
EOS%: 1.2 % (ref 0.0–7.0)
Eosinophils Absolute: 0.1 10*3/uL (ref 0.0–0.5)
HEMATOCRIT: 41.5 % (ref 34.8–46.6)
HEMOGLOBIN: 14 g/dL (ref 11.6–15.9)
LYMPH#: 1.6 10*3/uL (ref 0.9–3.3)
LYMPH%: 29.8 % (ref 14.0–49.7)
MCH: 30.4 pg (ref 25.1–34.0)
MCHC: 33.8 g/dL (ref 31.5–36.0)
MCV: 89.9 fL (ref 79.5–101.0)
MONO#: 0.5 10*3/uL (ref 0.1–0.9)
MONO%: 9.3 % (ref 0.0–14.0)
NEUT#: 3.2 10*3/uL (ref 1.5–6.5)
NEUT%: 59.1 % (ref 38.4–76.8)
PLATELETS: 241 10*3/uL (ref 145–400)
RBC: 4.62 10*6/uL (ref 3.70–5.45)
RDW: 13 % (ref 11.2–14.5)
WBC: 5.5 10*3/uL (ref 3.9–10.3)

## 2017-08-05 NOTE — Progress Notes (Signed)
Dear  [facility]  [phone] [phone]     ID: Samantha Sullivan DOB: 1951/04/04  MR#: [id number]  UMP#:[id number]  Patient Care Team: Shon Baton, MD as PCP - General (Internal Medicine) Alvy Alsop, Virgie Dad, MD as Consulting Physician (Oncology) Kem Boroughs, [place] as Nurse Practitioner (Family Medicine) Regal, Tamala Fothergill, DPM as Consulting Physician (Podiatry) Excell Seltzer, MD as Consulting Physician (General Surgery) Thea Silversmith, MD (Inactive) as Consulting Physician (Radiation Oncology) Sylvan Cheese, NP as Nurse Practitioner (Hematology and Oncology) PCP: Shon Baton, MD OTHER MD:  CHIEF COMPLAINT: Estrogen receptor positive breast cancer  CURRENT TREATMENT: Anastrozole   BREAST CANCER HISTORY:  From the original intake note:  "[name]"noted a lump in her right breast sometime in late October or early November, but there were "so many things going on in her life" she did not bring it to medical attention until she saw [name] 12/11/2015. Ms Samantha Sullivan was able to palpate a mass at the 9:30 o'clock position in the right breast, which was mobile and nontender. The left breast is status post prior remote biopsy and there was some scar tissue at the 7:00 position. The patient was set up for bilateral diagnostic Mammography with tomos[facility] at [facility] 12/27/2015. This found the breast density to be category C. There was a new oval mass in the right breast upper outer quadrant. Ultrasound the same day confirmed a 2.2 cm mass with irregular margins in the right breast at the 9:00 position read as highly suggestive of malignancy.  Biopsy of the right breast mass in question 01/02/2016 found ([id number]) an invasive ductal carcinoma, grade 1, estrogen receptor 100% positive, progesterone receptor 90% positive, both with strong staining intensity, with an MIB-1 of 10%, and no HER-2 amplification, the signals ratio being 1.32 and the number  per cell 2.05. The patient was informed and instructed to stop hormone replacement therapy (last dose 01/04/2016).  Her subsequent history is as detailed below.   INTERVAL HISTORY: Samantha Sullivan returns today for follow-up and treatment of her estrogen receptor positive breast cancer. She is back on anastrozole and this time she is tolerating it much better. Hot flashes and vaginal dryness are not a major issue. She never developed the arthralgias or myalgias that many patients can experience on this medication. She obtains it at a good price.  Mammography January of this year was benign. Bone density at the same time remains normal  REVIEW OF SYSTEMS: Samantha Sullivan exercises chiefly by walking and she tries to do about 5 miles a day. She recently traveled to [place] with her daughter and had a very good time. A detailed review of systems today was otherwise negative  PAST MEDICAL HISTORY: Past Medical History:  Diagnosis Date  . Abnormal CT of the chest 12/06   numeroous lung nodules - most have resolved.  . Arthritis    psoriatic arthritis  . Cancer [facility])    right breast cancer  . Family history of leukemia   . Family history of ovarian cancer   . History of meningioma 2007   in 2013 stable on MRI and recheck in 5 years  . HSV infection   . Psoriatic arthritis ([place])     PAST SURGICAL HISTORY: Past Surgical History:  Procedure Laterality Date  . BREAST LUMPECTOMY WITH RADIOACTIVE SEED AND SENTINEL LYMPH NODE BIOPSY Right 01/22/2016   Procedure: RIGHT BREAST LUMPECTOMY WITH RADIOACTIVE SEED AND SENTINEL LYMPH NODE BIOPSY;  Surgeon: Excell Seltzer, MD;  Location: [facility];  Service: General;  Laterality: Right;  . BREAST SURGERY Left 1971   lumpectomy benign  . COLONOSCOPY  8/06   normal and recheck in 10 years  . HAMMER TOE SURGERY Left 2016  . THYROIDECTOMY, PARTIAL Left 1983    FAMILY HISTORY Family History  Problem Relation Age of Onset  . Osteoporosis Mother   .  Osteoporosis Sister   . Lung cancer Maternal Aunt        smoker  . Heart Problems Maternal Grandmother   . COPD Maternal Grandfather   . Neurodegenerative disease Maternal Aunt        corical-ganglion degeneration  . Ovarian cancer Maternal Aunt        dx in her 43s  . Leukemia Cousin        60s-40s; maternal cousin  Samantha Sullivan has essentially no information on her father's side of the family aside from the fact that he died in his 55s from heart disease. He had psoriasis. The patient's mother is living as of January 2017, age 27. The patient had 2 brothers who died from congenital heart disease shortly after birth. The patient has one sister. The patient's mother had one sister, the patient's aunt, diagnosed with ovarian cancer in her 5s.  GYNECOLOGIC HISTORY:  Patient's last menstrual period was 03/22/2000 (approximate). Menarche age 58, first live birth age 64. The patient is GX P2. She went through menopause approximately 1990 but was taking hormone replacement until 01/04/2016. Since coming off replacement she has had insomnia but no hot flashes or vaginal dryness pleural bones so 4. She did take oral contraceptives remotely for more than 10 years with no complications  SOCIAL HISTORY:  Samantha Sullivan works as a Journalist, newspaper. She is divorced and lives alone, with no pets. Her daughter Tor Netters lives in [place]. She is disabled secondary to schizoaffective disorder. Daughter Lupe Carney "Lewanda Rife" Spurgeon lives in [place] and she is a Banker but mostly a homemaker. The patient has 3 grandchildren, the older 35, the other 2 being under 57 years old. The patient attends [employer]    ADVANCED DIRECTIVES: her daughter Lurena Joiner is her healthcare power of attorney. She can be reached at [phone]   HEALTH MAINTENANCE: Social History  Substance Use Topics  . Smoking status: Former Smoker    Packs/day: 1.00    Years: 12.00    Types: Cigarettes     Quit date: 07/23/1981  . Smokeless tobacco: Never Used  . Alcohol use No     Colonoscopy: ; [facility]  YHT:MBPJPET 2016/[facility]  Bone density:rJanuary 2018/normal   Lipid panel:  Allergies  Allergen Reactions  . Bee Venom   . Norco [Hydrocodone-Acetaminophen] Other (See Comments)    Pt states Norco made her extremely hyper after her surgery and she was not able to sleep.    Current Outpatient Prescriptions  Medication Sig Dispense Refill  . ANASTROZOLE PO Take by mouth.     Current Facility-Administered Medications  Medication Dose Route Frequency Provider Last Rate Last Dose  . triamcinolone acetonide (KENALOG) 10 MG/ML injection 10 mg  10 mg Other Once Lenn Sink, DPM        OBJECTIVE: middle-aged white woman In no acute distress  Vitals:   08/05/17 1351  BP: 129/65  Sullivan: 66  Resp: 18  Temp: 98.6 F (37 C)  SpO2: 100%     Body mass index is 31.68 kg/m.    ECOG FS:0 - Asymptomatic  Sclerae unicteric, EOMs intact Oropharynx clear and moist  No cervical or supraclavicular adenopathy Lungs no rales or rhonchi Heart regular rate and rhythm Abd soft, nontender, positive bowel sounds MSK no focal spinal tenderness, no upper extremity lymphedema Neuro: nonfocal, well oriented, appropriate affect Breasts: Right breast is status post lumpectomy and radiation. There is no evidence local recurrence. The left breast is benign. Both axillae are benign.    LAB RESULTS:  CMP     Component Value Date/Time   NA 141 08/05/2017 1335   K 4.5 08/05/2017 1335   CO2 29 08/05/2017 1335   GLUCOSE 77 08/05/2017 1335   BUN 13.4 08/05/2017 1335   CREATININE 0.8 08/05/2017 1335   CALCIUM 10.0 08/05/2017 1335   PROT 6.8 08/05/2017 1335   ALBUMIN 3.6 08/05/2017 1335   AST 19 08/05/2017 1335   ALT 12 08/05/2017 1335   ALKPHOS 169 (H) 08/05/2017 1335   BILITOT 0.63 08/05/2017 1335    INo results found for: SPEP, UPEP  Lab Results  Component Value Date   WBC 5.5 08/05/2017    NEUTROABS 3.2 08/05/2017   HGB 14.0 08/05/2017   HCT 41.5 08/05/2017   MCV 89.9 08/05/2017   PLT 241 08/05/2017      Chemistry      Component Value Date/Time   NA 141 08/05/2017 1335   K 4.5 08/05/2017 1335   CO2 29 08/05/2017 1335   BUN 13.4 08/05/2017 1335   CREATININE 0.8 08/05/2017 1335      Component Value Date/Time   CALCIUM 10.0 08/05/2017 1335   ALKPHOS 169 (H) 08/05/2017 1335   AST 19 08/05/2017 1335   ALT 12 08/05/2017 1335   BILITOT 0.63 08/05/2017 1335       No results found for: LABCA2  No components found for: LABCA125  No results for input(s): INR in the last 168 hours.  Urinalysis    Component Value Date/Time   BILIRUBINUR neg 12/11/2015 0925   PROTEINUR neg 12/11/2015 0925   UROBILINOGEN negative 12/11/2015 0925   NITRITE neg 12/11/2015 0925   LEUKOCYTESUR Negative 12/11/2015 0925    STUDIES: Bilateral diagnostic mammography at [facility] 11/28/2016 showed the breast density to be category C. This was complimented by right breast ultrasonography. There was no evidence of malignancy.  ASSESSMENT: 66 y.o. [place] woman status post right breast upper outer quadrant biopsy 01/02/2016 for a clinical T2 N0, stage IIA invasive ductal carcinoma, grade 1, estrogen and progesterone receptor positive, HER-2 not amplified, with an MIB-1 of 10%.  (1). Right lumpectomy and sentinel lymph node sampling 01/22/2016 showed a pT1c pN0, stage IA invasive ductal carcinoma, grade 1, with close but negative margins. Repeat HER-2 testing was again not amplified   (3) Oncotype score of 18 projects a risk of recurrence outside the breast within the next 10 years of 11% if the patient's only systemic therapy is tamoxifen for 5 years. It also predicts a very marginal benefit from chemotherapy.   (a) The patient opted against adjuvant chemotherapy; that is also our recommendation   (4) adjuvant radiation completed 04/01/2016  (5)  Anastrozole started 04/21/2016, discontinued  after approximately 1 month with significant side effects  (a)  Bone density at [facility] September 2013 was normal  (b) bone density January 2018 was normal  (c anastrozole restarted February 2018)    (6) genetics testing  02/24/2016 through the Breast/Ovarian cancer gene panel offered by [facility] found no deleterious mutations in ATM, BARD1, BRCA1, BRCA2, BRIP1, CDH1, CHEK2, EPCAM, FANCC, MLH1, MSH2, MSH6, NBN, PALB2, PMS2, PTEN, RAD51C, RAD51D, TP53, and  XRCC2.   PLAN:  Samantha Sullivan is doing much better on anastrozole this round and is willing to continue it to a total of 5 years.  She is now a year and a half out from definitive surgery for her breast cancer without any evidence of disease recurrence. This is favorable.  She will be due for repeat bone density January 2020.  Since she has her mammography in January, I'm going to see her again in March of next year and thereafter I will see her on a yearly basis until she completes her 5 years of follow-up  She knows to call for any problems that may develop before the next visit here.  Chauncey Cruel, MD   08/05/2017 2:24 PM Medical Oncology and Hematology [facility] [address] [place], [place] [address] Tel. [phone]    Fax. [phone]

## 2017-12-16 ENCOUNTER — Encounter: Payer: Self-pay | Admitting: Oncology

## 2017-12-16 DIAGNOSIS — N6324 Unspecified lump in the left breast, lower inner quadrant: Secondary | ICD-10-CM | POA: Diagnosis not present

## 2017-12-16 DIAGNOSIS — R922 Inconclusive mammogram: Secondary | ICD-10-CM | POA: Diagnosis not present

## 2017-12-17 ENCOUNTER — Other Ambulatory Visit: Payer: Self-pay | Admitting: Radiology

## 2017-12-17 ENCOUNTER — Encounter: Payer: Self-pay | Admitting: Oncology

## 2017-12-17 DIAGNOSIS — C50912 Malignant neoplasm of unspecified site of left female breast: Secondary | ICD-10-CM | POA: Diagnosis not present

## 2017-12-17 DIAGNOSIS — C50312 Malignant neoplasm of lower-inner quadrant of left female breast: Secondary | ICD-10-CM | POA: Diagnosis not present

## 2017-12-23 ENCOUNTER — Telehealth: Payer: Self-pay | Admitting: *Deleted

## 2017-12-23 NOTE — Progress Notes (Signed)
Dear  [facility]  [phone] [phone]     ID: Samantha Sullivan DOB: 1951/06/04  MR#: [id number]  YOV#:[id number]  Patient Care Team: Shon Baton, MD as PCP - General (Internal Medicine) Magrinat, Virgie Dad, MD as Consulting Physician (Oncology) Kem Boroughs, [place] as Nurse Practitioner (Family Medicine) Regal, Tamala Fothergill, DPM as Consulting Physician (Podiatry) Excell Seltzer, MD as Consulting Physician (General Surgery) Thea Silversmith, MD (Inactive) as Consulting Physician (Radiation Oncology) Sylvan Cheese, NP as Nurse Practitioner (Hematology and Oncology) PCP: Shon Baton, MD OTHER MD:  CHIEF COMPLAINT: Contralateral breast cancer   CURRENT TREATMENT: Anastrozole  INTERVAL HISTORY: Samantha Sullivan returns today for discussion of a new problem. She underwent routinely scheduled bilateral diagnostic mammography with tomography and left breast ultrasonography at [facility] on 12/16/2017 showing: breast density category C. Suspicious mass spanning 2.2 cm. Lymph nodes not involved.   Accordingly on 12/17/2017 she proceeded to biopsy of the left breast area in question. The pathology from this procedure showed [id number]): Invasive ductal carcinoma, Grade II, estrogen receptor 45% positive with weak staining intensity, progesterone receptor negative. Proliferation marker Ki67 at 85%. HER2 equivocal, with a signals ratio of 1.93 and the number per cell 4.10.  By immunohistochemistry HER-2 was negative, (1+).  She continues on anastrozole to treat the right-sided breast cancer, with good tolerance. She denies having hot flashes and vaginal dryness is not a major issue. She notes some arthralgias and myalgias, but these have mostly gone away.    REVIEW OF SYSTEMS: Samantha Sullivan reports that she is having trouble sleeping through the night. For exercise, she tries to walk 5 miles per day and records this with her FitBit. She reports that she is following up with  Dr. Excell Seltzer who is concerned that her cancer has returned within 2 years. She notes that he is recommending her to get bilateral mastectomies, but she is currently considering her options. She reports that she is considering to not have reconstruction surgery due to previous radiation treatments. She is also looking into DIEP flap surgery. She notes that before this diagnosis, she retired and bought an RV to start traveling in. She also reports that she had an aunt that was diagnosed with ovarian cancer, and her mother passed away in 2017-03-12 due to breast cancer. She denies unusual headaches, visual changes, nausea, vomiting, or dizziness. There has been no unusual cough, phlegm production, or pleurisy. This been no change in bowel or bladder habits. She denies unexplained fatigue or unexplained weight loss, bleeding, rash, or fever. A detailed review of systems was otherwise stable.   RIGHT BREAST CANCER HISTORY:  From the original intake note:  "Samantha Sullivan"noted a lump in her right breast sometime in late October or early November, but there were "so many things going on in her life" she did not bring it to medical attention until she saw [name] 12/11/2015. [name] was able to palpate a mass at the 9:30 o'clock position in the right breast, which was mobile and nontender. The left breast is status post prior remote biopsy and there was some scar tissue at the 7:00 position. The patient was set up for bilateral diagnostic Mammography with tomosynthesis at [facility] 12/27/2015. This found the breast density to be category C. There was a new oval mass in the right breast upper outer quadrant. Ultrasound the same day confirmed a 2.2 cm mass with irregular margins in the right breast at the 9:00 position read as highly suggestive of malignancy.  Biopsy of the  right breast mass in question 01/02/2016 found ([id number]) an invasive ductal carcinoma, grade 1, estrogen receptor 100% positive, progesterone receptor  90% positive, both with strong staining intensity, with an MIB-1 of 10%, and no HER-2 amplification, the signals ratio being 1.32 and the number per cell 2.05. The patient was informed and instructed to stop hormone replacement therapy (last dose 01/04/2016).  Her subsequent history is as detailed below.    PAST MEDICAL HISTORY: Past Medical History:  Diagnosis Date  . Abnormal CT of the chest 12/06   numeroous lung nodules - most have resolved.  . Arthritis    psoriatic arthritis  . Cancer [facility])    right breast cancer  . Family history of leukemia   . Family history of ovarian cancer   . History of meningioma 03-02-06   in Mar 02, 2012 stable on MRI and recheck in 5 years  . HSV infection   . Psoriatic arthritis ([place])     PAST SURGICAL HISTORY: Past Surgical History:  Procedure Laterality Date  . BREAST LUMPECTOMY WITH RADIOACTIVE SEED AND SENTINEL LYMPH NODE BIOPSY Right 01/22/2016   Procedure: RIGHT BREAST LUMPECTOMY WITH RADIOACTIVE SEED AND SENTINEL LYMPH NODE BIOPSY;  Surgeon: Excell Seltzer, MD;  Location: [place];  Service: General;  Laterality: Right;  . BREAST SURGERY Left 1971   lumpectomy benign  . COLONOSCOPY  8/06   normal and recheck in 10 years  . HAMMER TOE SURGERY Left 03/03/15  . THYROIDECTOMY, PARTIAL Left 1983    FAMILY HISTORY Family History  Problem Relation Age of Onset  . Osteoporosis Mother   . Osteoporosis Sister   . Lung cancer Maternal Aunt        smoker  . Heart Problems Maternal Grandmother   . COPD Maternal Grandfather   . Neurodegenerative disease Maternal Aunt        corical-ganglion degeneration  . Ovarian cancer Maternal Aunt        dx in her 5s  . Leukemia Cousin        1s-40s; maternal cousin  [name] has essentially no information on her father's side of the family aside from the fact that he died in his 61s from heart disease. He had psoriasis. The patient's mother passed away Mar 02, 2017 age 50, due to breast cancer. The  patient had 2 brothers who died from congenital heart disease shortly after birth. The patient has one sister. The patient's mother had one sister, the patient's aunt, diagnosed with ovarian cancer in her 32s.  GYNECOLOGIC HISTORY:  Patient's last menstrual period was 03/22/2000 (approximate). Menarche age 21, first live birth age 44. The patient is GX P2. She went through menopause approximately March 02, 1989 but was taking hormone replacement until 01/04/2016. Since coming off replacement she has had insomnia but no hot flashes or vaginal dryness pleural bones so 4. 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 She is divorced and lives alone, with no pets. Her daughter [name] lives in [place]. She is disabled secondary to schizoaffective disorder. Daughter [name] lives in [place] and she is a Estate agent but mostly a homemaker. The patient has 3 grandchildren, the older 18, the other 2 being under 48 years old. The patient attends [employer]. Her mother passed away in 03/02/17 due to breast cancer.    ADVANCED DIRECTIVES: her daughter [name] is her healthcare  power of attorney. She can be reached at [place]: Social History   Tobacco Use  . Smoking status: Former Smoker    Packs/day: 1.00    Years: 12.00    Pack years: 12.00    Types: Cigarettes    Last attempt to quit: 07/23/1981    Years since quitting: 36.4  . Smokeless tobacco: Never Used  Substance Use Topics  . Alcohol use: No    Alcohol/week: 0.0 oz  . Drug use: No     Colonoscopy: ; [facility]  EHM:CNOBSJG 2016/[facility]  Bone density:rJanuary 2018/normal   Lipid panel:  Allergies  Allergen Reactions  . Bee Venom   . Norco [Hydrocodone-Acetaminophen] Other (See Comments)    Pt states Norco made her extremely hyper  after her surgery and she was not able to sleep.    Current Outpatient Medications  Medication Sig Dispense Refill  . ANASTROZOLE PO Take by mouth.     Current Facility-Administered Medications  Medication Dose Route Frequency Provider Last Rate Last Dose  . triamcinolone acetonide (KENALOG) 10 MG/ML injection 10 mg  10 mg Other Once Wallene Huh, DPM        OBJECTIVE: middle-aged white woman who appears well  Vitals:   12/25/17 1350  BP: (!) 144/80  Sullivan: 83  Resp: 18  Temp: 98.3 F (36.8 C)  SpO2: 100%     Body mass index is 30.96 kg/m.    ECOG FS:0 - Asymptomatic  Sclerae unicteric, pupils round and equal Oropharynx clear and moist No cervical or supraclavicular adenopathy Lungs no rales or rhonchi Heart regular rate and rhythm Abd soft, nontender, positive bowel sounds MSK no focal spinal tenderness, no upper extremity lymphedema Neuro: nonfocal, well oriented, appropriate affect Breasts: The right breast is status post lumpectomy and radiation.  There is no evidence of local recurrence.  The left breast is status post recent biopsy.  There is a mild ecchymosis.  I do not palpate a well-defined mass.  Both axillae are benign.  LAB RESULTS:  CMP     Component Value Date/Time   NA 141 08/05/2017 1335   K 4.5 08/05/2017 1335   CO2 29 08/05/2017 1335   GLUCOSE 77 08/05/2017 1335   BUN 13.4 08/05/2017 1335   CREATININE 0.8 08/05/2017 1335   CALCIUM 10.0 08/05/2017 1335   PROT 6.8 08/05/2017 1335   ALBUMIN 3.6 08/05/2017 1335   AST 19 08/05/2017 1335   ALT 12 08/05/2017 1335   ALKPHOS 169 (H) 08/05/2017 1335   BILITOT 0.63 08/05/2017 1335    INo results found for: SPEP, UPEP  Lab Results  Component Value Date   WBC 5.5 08/05/2017   NEUTROABS 3.2 08/05/2017   HGB 14.0 08/05/2017   HCT 41.5 08/05/2017   MCV 89.9 08/05/2017   PLT 241 08/05/2017      Chemistry      Component Value Date/Time   NA 141 08/05/2017 1335   K 4.5 08/05/2017 1335   CO2 29  08/05/2017 1335   BUN 13.4 08/05/2017 1335   CREATININE 0.8 08/05/2017 1335      Component Value Date/Time   CALCIUM 10.0 08/05/2017 1335   ALKPHOS 169 (H) 08/05/2017 1335   AST 19 08/05/2017 1335   ALT 12 08/05/2017 1335   BILITOT 0.63 08/05/2017 1335       No results found for: LABCA2  No components found for: LABCA125  No results for input(s): INR in the last 168 hours.  Urinalysis  Component Value Date/Time   BILIRUBINUR neg 12/11/2015 0925   PROTEINUR neg 12/11/2015 0925   UROBILINOGEN negative 12/11/2015 0925   NITRITE neg 12/11/2015 0925   LEUKOCYTESUR Negative 12/11/2015 0925    STUDIES: She underwent bilateral diagnostic mammography with tomography and left breast ultrasonography at [facility] on 12/16/2017 showing: breast density category C.  Per patient report, the mass measured 2.2 cm in lymph nodes were not involved--we do not have the actual ultrasound copy available today   ASSESSMENT: 67 y.o. [place] woman status post right breast upper outer quadrant biopsy 01/02/2016 for a clinical T2 N0, stage IIA invasive ductal carcinoma, grade 1, estrogen and progesterone receptor positive, HER-2 not amplified, with an MIB-1 of 10%.  (1). Right lumpectomy and sentinel lymph node sampling 01/22/2016 showed a pT1c pN0, stage IA invasive ductal carcinoma, grade 1, with close but negative margins. Repeat HER-2 testing was again not amplified   (3) Oncotype score of 18 predicts a risk of recurrence outside the breast within the next 10 years of 11% if the patient's only systemic therapy is tamoxifen for 5 years. It also predicts no significant benefit from chemotherapy.     (4) adjuvant radiation3/14/17-4/11/17 Site/dose:   Right breast - 42.72 gy at 2.67 Gy per fraction x 16 fractions followed by an electron boost to the right breast with 10 Gy at 2 Gy per fraction x 5 fractions.    (5)  Anastrozole started 04/21/2016, discontinued after approximately 1 month with  significant side effects  (a)  Bone density at [facility] September 2013 was normal  (b) bone density January 2018 was normal  (c) anastrozole restarted February 2018  (6) genetics testing  02/24/2016 through the Breast/Ovarian cancer gene panel offered by [facility] found no deleterious mutations in ATM, BARD1, BRCA1, BRCA2, BRIP1, CDH1, CHEK2, EPCAM, FANCC, MLH1, MSH2, MSH6, NBN, PALB2, PMS2, PTEN, RAD51C, RAD51D, TP53, and XRCC2.   (7) left breast biopsy 12/17/2017 showed a cT2 cN0, stage IIA- IIB invasive ductal carcinoma, grade 2, estrogen receptor weakly positive, progesterone receptor negative, with HER-2 equivocal by FISH, negative by immunohistochemistry  PLAN: I spent well over an hour with Samantha Sullivan going over her complex situation.  It does not help that she has some family members telling her the reason she has breast cancer is that she eats meat and that if she went on a vegan diet she would be cured.  It also does not help that she has many other friends who have told her if she simply prayed for cure she would be cured and if she is not then that this means she has insufficient faith.  I think it is best to divide her options into local treatment and systemic treatment.  In terms of local treatment she is considering bilateral mastectomies.  We discussed the fact that lumpectomy and radiation is equivalent to mastectomy in terms of survival.  She understands if she has bilateral mastectomies versus having a left lumpectomy and radiation she will not live 1 day longer.  However that is not the reason she is considering bilateral mastectomies.  The reason is that she now has a second, contralateral breast cancer within 2 years of her first, in a family setting suggestive of some genetic predisposition which was not detected by extensive testing.  She is concerned that within 2 years she may have a third cancer and she certainly does not want that.  Accordingly she is very motivated to  proceeding to bilateral mastectomies and I support that decision.  She is somewhat confused regarding what to do about reconstruction.  Several of her friends that she says are "well informed" have told her that she should never under any circumstances undergo implant placement.  She has read up on the DIEP reconstruction.  She understands these surgeries can take anywhere from 6-12 hours, and that this option is not available in [place].  We discussed the possibility of left nipple sparing mastectomy with implant reconstruction and right nipple sparing mastectomy with consideration of a latissimus flap, versus TRAM flaps, versus simple implants, versus no reconstruction.  I offered her referral to 1 of our local plastic surgeons or to a surgeon experienced in the DIEP procedure if she wishes to explore that further  After this lengthy discussion she appears to be leaning towards no reconstruction or at least no immediate reconstruction.  She will let us or Dr. Excell Seltzer her surgeon know early next week whether she would like to meet with a local plastic surgeon to discuss this further.  She understands that if she has simple mastectomies without immediate reconstruction she is forgoing some options (such as for example nipple sparing reconstruction).  We then discussed systemic therapy.  I am concerned that this breast cancer is essentially estrogen and progesterone receptor negative.  Estrogen positivity is weak and this tumor grew right through anastrozole.  Accordingly I recommended chemotherapy.  She is interested in obtaining an Oncotype and we will obtain that from the definitive surgical sample.  We will also repeat the prognostic panel on that more extensive sample, specifically I am concerned regarding repeat HER-2 testing.  If the repeat prognostic panel remains HER-2 negative, I recommended doxorubicin and cyclophosphamide in dose dense fashion x4 followed by weekly paclitaxel x12.  Unless  there are delays related to discussion of reconstruction options, I expect she will be having her surgery within the next 2-3 weeks.  Accordingly I am scheduling her to return to see me in about 4 weeks.  We should have all the data available at that time and she should be sufficiently recovered that we can start beginning to plan for her adjuvant systemic treatment.  Samantha Sullivan has a good understanding of this plan.  She agrees with it.  She knows the goal of treatment in her case is cure.  She will call with any issues that may develop before her next visit here.    Medical Oncology and Hematology [facility] [address] [place], [place] [address] Tel. [phone]    Fax. [phone]  This document serves as a record of services personally performed by Lurline Del, MD. It was created on his behalf by Sheron Nightingale, a trained medical scribe. The creation of this record is based on the scribe's personal observations and the provider's statements to them.   I have reviewed the above documentation for accuracy and completeness, and I agree with the above.

## 2017-12-23 NOTE — Telephone Encounter (Signed)
Patient has new left breast cancer.  Confirmed appointment for 12/25/17 with Dr. Jana Hakim to discuss treatment options.

## 2017-12-24 DIAGNOSIS — Z170 Estrogen receptor positive status [ER+]: Secondary | ICD-10-CM | POA: Diagnosis not present

## 2017-12-24 DIAGNOSIS — C50312 Malignant neoplasm of lower-inner quadrant of left female breast: Secondary | ICD-10-CM | POA: Diagnosis not present

## 2017-12-25 ENCOUNTER — Inpatient Hospital Stay: Payer: PPO | Attending: Oncology | Admitting: Oncology

## 2017-12-25 VITALS — BP 144/80 | HR 83 | Temp 98.3°F | Resp 18 | Ht 65.5 in | Wt 188.9 lb

## 2017-12-25 DIAGNOSIS — Z170 Estrogen receptor positive status [ER+]: Secondary | ICD-10-CM | POA: Diagnosis not present

## 2017-12-25 DIAGNOSIS — C50812 Malignant neoplasm of overlapping sites of left female breast: Secondary | ICD-10-CM

## 2017-12-25 DIAGNOSIS — C50411 Malignant neoplasm of upper-outer quadrant of right female breast: Secondary | ICD-10-CM

## 2017-12-26 DIAGNOSIS — C50812 Malignant neoplasm of overlapping sites of left female breast: Secondary | ICD-10-CM | POA: Insufficient documentation

## 2017-12-31 ENCOUNTER — Ambulatory Visit: Payer: Self-pay | Admitting: General Surgery

## 2017-12-31 DIAGNOSIS — C50312 Malignant neoplasm of lower-inner quadrant of left female breast: Secondary | ICD-10-CM

## 2017-12-31 DIAGNOSIS — Z170 Estrogen receptor positive status [ER+]: Secondary | ICD-10-CM | POA: Diagnosis not present

## 2018-01-01 ENCOUNTER — Encounter (HOSPITAL_COMMUNITY): Payer: Self-pay

## 2018-01-01 ENCOUNTER — Other Ambulatory Visit: Payer: Self-pay

## 2018-01-01 NOTE — H&P (Signed)
History of Present Illness Samantha Kitchen T. Zygmunt Mcglinn MD; 12/31/2017 12:28 PM) The patient is a 67 year old female who presents with breast cancer. She returns to the office for further surgical planning for new diagnosis of left breast cancer.  Patient is well known to me with a history of right breast cancer status post lumpectomy and sentinel lymph node biopsy, radiation and anastrozole diagnosed 2 years ago.  Her initial clinical presentation was the following: Tumor size: 2.2 cm Tumor grade: 1, Ki-67 10% Estrogen Receptor: Positive Progesterone Receptor: Positive Her-2 neu: Negative Lymph node status: Negative  Pathology that showed invasive and in situ carcinoma with clear margins and 1 negative sentinel lymph node. Tumor was 1.9 cm. She had an intermediate Oncotype score and hormonal therapy with anastrozole was started.   She unfortunately now presents with a second cancer in the opposite breast. This was picked up recently on a screening mammogram. This showed a possible mass in the left breast. Ultrasound revealed a new 2.2 cm lobulated mass in the left breast at 7 o'clock position posterior depth. Axilla is negative. Ultrasound-guided large core needle biopsy was performed which has revealed invasive ductal carcinoma. Her current tumor characteristics R: Tumor size: 2.2 cm Tumor grade: 2, Ki-67 85% Estrogen Receptor: 40% weakly positive Progesterone Receptor: Negative Her-2 neu: Equivocal, negative on IHC Lymph node status: Negative  She has been able to feel a mass since the biopsy but was asymptomatic prior.  She has seen Dr. Jana Sullivan for medical oncology and postoperative chemotherapy is planned and Port-A-Cath is requested.    Problem List/Past Medical Samantha Kitchen T. Layken Doenges, MD; 12/31/2017 12:28 PM) CANCER OF RIGHT BREAST, STAGE 1 (C50.911)  MALIGNANT NEOPLASM OF LOWER-INNER QUADRANT OF LEFT BREAST IN FEMALE, ESTROGEN RECEPTOR POSITIVE (C50.312)   Past Surgical  History Samantha Kitchen T. Makyia Erxleben, MD; 12/31/2017 12:28 PM) Breast Biopsy  Right. Cesarean Section - Multiple  Thyroid Surgery   Diagnostic Studies History Samantha Kitchen T. Karan Inclan, MD; 12/31/2017 12:28 PM) Colonoscopy  >10 years ago Mammogram  within last year  Allergies Samantha Kitchen T. Kimimila Tauzin, MD; 12/31/2017 12:28 PM) BEE VENOM  Norco *ANALGESICS - OPIOID*   Medication History Samantha Kitchen T. Mustafa Potts, MD; 12/31/2017 12:28 PM) Anastrozole ('1MG'$  Tablet, Oral) Active. Folic Acid ('1MG'$  Tablet, Oral) Active. Illusions AA Breast Prosthesis (1 (one) as needed) Active. (L8000 Post-surgical bras - to hold breast prosthesis ; QTY:6; Length of use:3 Months 3 Refills Non-silicone breast prosthesis - for temporary use during healing or for comfort at end of day, lighter weight, cooler for hot weather.; QTY: 1 for single, 2 for bilateral ; Length of use: 6 months Up to 1 refill; Replacement [id number] Silicone Breast Prosthesis - to restore balance and symmetry after breast surgery; QTY: 1 for single, 2 for Bilateral; Length of use: 2 years No refills)  Social History Samantha Kitchen T. Karlie Aung, MD; 12/31/2017 12:28 PM) Alcohol use  Remotely quit alcohol use. Caffeine use  Coffee. No drug use  Tobacco use  Former smoker.  Family History Samantha Kitchen T. Olia Hinderliter, MD; 12/31/2017 12:28 PM) Alcohol Abuse  Father. Arthritis  Sister. Depression  Sister. Ovarian Cancer  Family Members In General. Thyroid problems  Family Members In General.  Pregnancy / Birth History Samantha Kitchen T. Karson Chicas, MD; 12/31/2017 12:28 PM) Age at menarche  77 years. Gravida  2 Maternal age  70-25 Para  2  Other Problems Samantha Kitchen T. Kanijah Groseclose, MD; 12/31/2017 12:28 PM) Arthritis  Lump In Breast   Vitals (Samantha Sullivan CMA; 12/31/2017 11:54 AM) 12/31/2017 11:53 AM Weight: 190 lb Height:  64.5in Body Surface Area: 1.92 m Body Mass Index: 32.11 kg/m  Pulse: 73 (Regular)  BP: 128/82 (Sitting, Left Arm,  Standard)       Physical Exam Samantha Kitchen T. Zarai Orsborn MD; 12/31/2017 12:30 PM) The physical exam findings are as follows: Note:General: Alert, well-developed and well nourished Caucasian female, in no distress Skin: Warm and dry without rash or infection. HEENT: No palpable masses or thyromegaly. Sclera nonicteric. Pupils equal round and reactive. Lymph nodes: No cervical, supraclavicular, nodes palpable. Breasts: There is bruising and an associated several centimeter palpable mass in the lower inner left breast. No other palpable abnormalities in either breast with well-healed lumpectomy scar lateral right. No palpable axillary adenopathy bilaterally. Lungs: Breath sounds clear and equal. No wheezing or increased work of breathing. Cardiovascular: Regular rate and rhythm without murmer. No JVD or edema. Peripheral pulses intact. No carotid bruits. Extremities: No edema or joint swelling or deformity. No chronic venous stasis changes. Neurologic: Alert and fully oriented. Gait normal. No focal weakness. Psychiatric: Normal mood and affect. Thought content appropriate with normal judgement and insight    Assessment & Plan Samantha Kitchen T. Venita Seng MD; 12/31/2017 12:36 PM) MALIGNANT NEOPLASM OF LOWER-INNER QUADRANT OF LEFT BREAST IN FEMALE, ESTROGEN RECEPTOR POSITIVE (C50.312) Impression: Patient well known to me with a history of right invasive ductal carcinoma ER/PR positive node negative status post lumpectomy, radiation and Arimidex diagnosed 2 years ago.  She now presents with a new diagnosis of cancer of the left breast, lower inner quadrant. Clinical stage 1B, ER 40% weakly positive, PR negative, HER-2 negative by IHC. I had previously discussed with the patient initial surgical treatment options. We discussed options of breast conservation with lumpectomy or total mastectomy and sentinal lymph node biopsy/dissection. Options for reconstruction were discussed. She did test negative with  genetics to years ago. However it is certainly troubling that she has a strong family history of breast cancer in aunt and cousin and mother and now a second breast cancer. We discussed options in detail and the fact that this cancer could certainly be successfully treated with breast conservation with lumpectomy and radiation and sentinel lymph node biopsy but her risk of subsequent cancers is difficult to determine but would be of some concern. Discussed option of bilateral mastectomy with or without immediate reconstruction. We spent a long time going through pros and cons of each option and I think she is well informed. After further discussion with her medical oncologist and time to carefully consider options she has elected to proceed with bilateral mastectomy without reconstruction. We went over this procedure in detail today including its nature and recovery and potential risks of anesthetic Occasions, bleeding, infection, wound healing problems and slight risk of lymphedema. She will require a sentinel lymph node biopsy on the left. We will also place a Port-A-Cath and I discussed this procedure in detail including immediate risks of bleeding or vascular injury, pneumothorax and long-term risks of DVT or catheter malfunction or displacement or infection. All her questions were answered and she is very comfortable with this decision and rea Current Plans Schedule for Surgery  Bilateral total mastectomy with left axillary sentinel lymph node biopsy and Port-A-Cath placement under general anesthesia with overnight hospitalization

## 2018-01-01 NOTE — Progress Notes (Signed)
Pt. Verbalizes preop instructions.  Addition to history is that she reports an ECHO was done perhaps 10 yrs. Ago or more but she doesn't recall why it was done & she was not told of any need for F/U .  Pt. Denies concerns for changes in her chest or breathing.

## 2018-01-04 ENCOUNTER — Ambulatory Visit (HOSPITAL_COMMUNITY)
Admission: RE | Admit: 2018-01-04 | Discharge: 2018-01-04 | Disposition: A | Discharge status: Home / Self Care | Payer: PPO | Source: Ambulatory Visit | Attending: General Surgery | Admitting: General Surgery

## 2018-01-04 ENCOUNTER — Encounter (HOSPITAL_COMMUNITY)
Admission: RE | Disposition: A | Discharge status: Home / Self Care | Payer: Self-pay | Source: Ambulatory Visit | Attending: General Surgery

## 2018-01-04 ENCOUNTER — Ambulatory Visit (HOSPITAL_COMMUNITY): Payer: PPO | Admitting: Anesthesiology

## 2018-01-04 ENCOUNTER — Other Ambulatory Visit: Payer: Self-pay

## 2018-01-04 ENCOUNTER — Encounter (HOSPITAL_COMMUNITY): Payer: Self-pay | Admitting: Surgery

## 2018-01-04 ENCOUNTER — Observation Stay (HOSPITAL_COMMUNITY)
Admission: RE | Admit: 2018-01-04 | Discharge: 2018-01-05 | Disposition: A | Discharge status: Home / Self Care | Payer: PPO | Source: Ambulatory Visit | Attending: General Surgery | Admitting: General Surgery

## 2018-01-04 ENCOUNTER — Ambulatory Visit (HOSPITAL_COMMUNITY): Payer: PPO

## 2018-01-04 ENCOUNTER — Observation Stay (HOSPITAL_COMMUNITY): Payer: PPO

## 2018-01-04 DIAGNOSIS — Z885 Allergy status to narcotic agent status: Secondary | ICD-10-CM | POA: Insufficient documentation

## 2018-01-04 DIAGNOSIS — C50312 Malignant neoplasm of lower-inner quadrant of left female breast: Secondary | ICD-10-CM

## 2018-01-04 DIAGNOSIS — T8182XA Emphysema (subcutaneous) resulting from a procedure, initial encounter: Secondary | ICD-10-CM | POA: Diagnosis not present

## 2018-01-04 DIAGNOSIS — Y813 Surgical instruments, materials and general- and plastic-surgery devices (including sutures) associated with adverse incidents: Secondary | ICD-10-CM | POA: Insufficient documentation

## 2018-01-04 DIAGNOSIS — Z87891 Personal history of nicotine dependence: Secondary | ICD-10-CM | POA: Diagnosis not present

## 2018-01-04 DIAGNOSIS — C50912 Malignant neoplasm of unspecified site of left female breast: Secondary | ICD-10-CM | POA: Diagnosis not present

## 2018-01-04 DIAGNOSIS — C50812 Malignant neoplasm of overlapping sites of left female breast: Secondary | ICD-10-CM | POA: Diagnosis not present

## 2018-01-04 DIAGNOSIS — Z419 Encounter for procedure for purposes other than remedying health state, unspecified: Secondary | ICD-10-CM

## 2018-01-04 DIAGNOSIS — Z4001 Encounter for prophylactic removal of breast: Secondary | ICD-10-CM | POA: Insufficient documentation

## 2018-01-04 DIAGNOSIS — Z853 Personal history of malignant neoplasm of breast: Secondary | ICD-10-CM | POA: Insufficient documentation

## 2018-01-04 DIAGNOSIS — Z170 Estrogen receptor positive status [ER+]: Secondary | ICD-10-CM | POA: Diagnosis not present

## 2018-01-04 DIAGNOSIS — N6031 Fibrosclerosis of right breast: Secondary | ICD-10-CM | POA: Diagnosis not present

## 2018-01-04 DIAGNOSIS — C50911 Malignant neoplasm of unspecified site of right female breast: Secondary | ICD-10-CM | POA: Diagnosis not present

## 2018-01-04 DIAGNOSIS — Z923 Personal history of irradiation: Secondary | ICD-10-CM | POA: Insufficient documentation

## 2018-01-04 DIAGNOSIS — Z91030 Bee allergy status: Secondary | ICD-10-CM | POA: Insufficient documentation

## 2018-01-04 DIAGNOSIS — J439 Emphysema, unspecified: Secondary | ICD-10-CM | POA: Diagnosis not present

## 2018-01-04 DIAGNOSIS — Z79899 Other long term (current) drug therapy: Secondary | ICD-10-CM | POA: Insufficient documentation

## 2018-01-04 DIAGNOSIS — M1990 Unspecified osteoarthritis, unspecified site: Secondary | ICD-10-CM | POA: Diagnosis not present

## 2018-01-04 DIAGNOSIS — G8918 Other acute postprocedural pain: Secondary | ICD-10-CM | POA: Diagnosis not present

## 2018-01-04 DIAGNOSIS — N6011 Diffuse cystic mastopathy of right breast: Secondary | ICD-10-CM | POA: Diagnosis not present

## 2018-01-04 DIAGNOSIS — M779 Enthesopathy, unspecified: Secondary | ICD-10-CM

## 2018-01-04 HISTORY — DX: Other complications of anesthesia, initial encounter: T88.59XA

## 2018-01-04 HISTORY — DX: Adverse effect of unspecified anesthetic, initial encounter: T41.45XA

## 2018-01-04 HISTORY — DX: Family history of other specified conditions: Z84.89

## 2018-01-04 HISTORY — PX: PORTACATH PLACEMENT: SHX2246

## 2018-01-04 HISTORY — PX: MASTECTOMY: SHX3

## 2018-01-04 HISTORY — PX: BILATERAL TOTAL MASTECTOMY WITH AXILLARY LYMPH NODE DISSECTION: SHX6364

## 2018-01-04 HISTORY — PX: MASTECTOMY COMPLETE / SIMPLE W/ SENTINEL NODE BIOPSY: SUR846

## 2018-01-04 LAB — BASIC METABOLIC PANEL
Anion gap: 8 (ref 5–15)
BUN: 20 mg/dL (ref 6–20)
CHLORIDE: 107 mmol/L (ref 101–111)
CO2: 24 mmol/L (ref 22–32)
CREATININE: 0.79 mg/dL (ref 0.44–1.00)
Calcium: 9.4 mg/dL (ref 8.9–10.3)
GFR calc Af Amer: >60 mL/min (ref >60)
Glucose, Bld: 92 mg/dL (ref 65–99)
Potassium: 4.1 mmol/L (ref 3.5–5.1)
SODIUM: 139 mmol/L (ref 135–145)

## 2018-01-04 LAB — CBC
HCT: 40.8 % (ref 36.0–46.0)
Hemoglobin: 13.8 g/dL (ref 12.0–15.0)
MCH: 29.9 pg (ref 26.0–34.0)
MCHC: 33.8 g/dL (ref 30.0–36.0)
MCV: 88.5 fL (ref 78.0–100.0)
PLATELETS: 258 10*3/uL (ref 150–400)
RBC: 4.61 MIL/uL (ref 3.87–5.11)
RDW: 13.1 % (ref 11.5–15.5)
WBC: 6.2 10*3/uL (ref 4.0–10.5)

## 2018-01-04 SURGERY — BILATERAL TOTAL MASTECTOMY WITH AXILLARY LYMPH NODE DISSECTION
Anesthesia: Regional | Laterality: Right

## 2018-01-04 MED ORDER — CEFAZOLIN SODIUM-DEXTROSE 2-4 GM/100ML-% IV SOLN
2.0000 g | INTRAVENOUS | Status: AC
  Administered 2018-01-04: 2 g via INTRAVENOUS
  Filled 2018-01-04: qty 100

## 2018-01-04 MED ORDER — PROMETHAZINE HCL 25 MG/ML IJ SOLN: 6.2500 mg | INTRAMUSCULAR | Status: DC | PRN

## 2018-01-04 MED ORDER — HEPARIN SOD (PORK) LOCK FLUSH 100 UNIT/ML IV SOLN
INTRAVENOUS | Status: AC
  Filled 2018-01-04: qty 5

## 2018-01-04 MED ORDER — 0.9 % SODIUM CHLORIDE (POUR BTL) OPTIME
TOPICAL | Status: DC | PRN
  Administered 2018-01-04 (×2): 1000 mL

## 2018-01-04 MED ORDER — SODIUM BICARBONATE 4 % IV SOLN
INTRAVENOUS | Status: AC
  Filled 2018-01-04: qty 5

## 2018-01-04 MED ORDER — FENTANYL CITRATE (PF) 250 MCG/5ML IJ SOLN
INTRAMUSCULAR | Status: AC
  Filled 2018-01-04: qty 5

## 2018-01-04 MED ORDER — CHLORHEXIDINE GLUCONATE CLOTH 2 % EX PADS: 6.0000 | MEDICATED_PAD | Freq: Once | CUTANEOUS | Status: DC

## 2018-01-04 MED ORDER — CELECOXIB 200 MG PO CAPS
200.0000 mg | ORAL_CAPSULE | ORAL | Status: AC
  Administered 2018-01-04: 200 mg via ORAL
  Filled 2018-01-04: qty 1

## 2018-01-04 MED ORDER — SUGAMMADEX SODIUM 200 MG/2ML IV SOLN
INTRAVENOUS | Status: DC | PRN
  Administered 2018-01-04: 160 mg via INTRAVENOUS

## 2018-01-04 MED ORDER — SODIUM CHLORIDE 0.9 % IJ SOLN
INTRAMUSCULAR | Status: AC
  Filled 2018-01-04: qty 10

## 2018-01-04 MED ORDER — FENTANYL CITRATE (PF) 100 MCG/2ML IJ SOLN
100.0000 ug | Freq: Once | INTRAMUSCULAR | Status: AC
  Administered 2018-01-04: 100 ug via INTRAVENOUS

## 2018-01-04 MED ORDER — PROPOFOL 1000 MG/100ML IV EMUL
INTRAVENOUS | Status: AC
  Filled 2018-01-04: qty 100

## 2018-01-04 MED ORDER — TECHNETIUM TC 99M SULFUR COLLOID FILTERED
1.0000 | Freq: Once | INTRAVENOUS | Status: AC | PRN
  Administered 2018-01-04: 1 via INTRADERMAL

## 2018-01-04 MED ORDER — METHYLENE BLUE 0.5 % INJ SOLN
INTRAVENOUS | Status: AC
  Filled 2018-01-04: qty 10

## 2018-01-04 MED ORDER — GABAPENTIN 300 MG PO CAPS
300.0000 mg | ORAL_CAPSULE | ORAL | Status: AC
  Administered 2018-01-04: 300 mg via ORAL
  Filled 2018-01-04: qty 1

## 2018-01-04 MED ORDER — POLYVINYL ALCOHOL 1.4 % OP SOLN: 1.0000 [drp] | Freq: Three times a day (TID) | OPHTHALMIC | Status: DC | PRN

## 2018-01-04 MED ORDER — POLYETHYL GLYCOL-PROPYL GLYCOL 0.4-0.3 % OP SOLN: 1.0000 [drp] | Freq: Three times a day (TID) | OPHTHALMIC | Status: DC | PRN

## 2018-01-04 MED ORDER — KCL-LACTATED RINGERS-D5W 20 MEQ/L IV SOLN
INTRAVENOUS | Status: DC
  Administered 2018-01-04: 21:00:00 via INTRAVENOUS
  Filled 2018-01-04: qty 1000

## 2018-01-04 MED ORDER — HYDROMORPHONE HCL 1 MG/ML IJ SOLN
INTRAMUSCULAR | Status: AC
  Administered 2018-01-04: 0.5 mg via INTRAVENOUS
  Filled 2018-01-04: qty 1

## 2018-01-04 MED ORDER — LACTATED RINGERS IV SOLN
INTRAVENOUS | Status: DC
  Administered 2018-01-04: 13:00:00 via INTRAVENOUS

## 2018-01-04 MED ORDER — LIDOCAINE-EPINEPHRINE (PF) 1.5 %-1:200000 IJ SOLN
INTRAMUSCULAR | Status: DC | PRN
  Administered 2018-01-04: 30 mL

## 2018-01-04 MED ORDER — MIDAZOLAM HCL 5 MG/5ML IJ SOLN
INTRAMUSCULAR | Status: DC | PRN
  Administered 2018-01-04: 2 mg via INTRAVENOUS

## 2018-01-04 MED ORDER — LIDOCAINE HCL (PF) 1 % IJ SOLN
INTRAMUSCULAR | Status: AC
  Filled 2018-01-04: qty 30

## 2018-01-04 MED ORDER — LACTATED RINGERS IV SOLN
INTRAVENOUS | Status: DC | PRN
  Administered 2018-01-04 (×2): via INTRAVENOUS

## 2018-01-04 MED ORDER — SODIUM CHLORIDE 0.9 % IV SOLN
INTRAVENOUS | Status: DC | PRN
  Administered 2018-01-04: 500 mL

## 2018-01-04 MED ORDER — BUPIVACAINE-EPINEPHRINE 0.5% -1:200000 IJ SOLN
INTRAMUSCULAR | Status: DC | PRN
  Administered 2018-01-04: 10 mL

## 2018-01-04 MED ORDER — MIDAZOLAM HCL 2 MG/2ML IJ SOLN: 0.5000 mg | Freq: Once | INTRAMUSCULAR | Status: DC | PRN

## 2018-01-04 MED ORDER — MORPHINE SULFATE (PF) 4 MG/ML IV SOLN
2.0000 mg | INTRAVENOUS | Status: DC | PRN
  Administered 2018-01-05: 2 mg via INTRAVENOUS
  Filled 2018-01-04: qty 1

## 2018-01-04 MED ORDER — FENTANYL CITRATE (PF) 100 MCG/2ML IJ SOLN
INTRAMUSCULAR | Status: AC
  Administered 2018-01-04: 100 ug via INTRAVENOUS
  Filled 2018-01-04: qty 2

## 2018-01-04 MED ORDER — ONDANSETRON HCL 4 MG/2ML IJ SOLN
INTRAMUSCULAR | Status: DC | PRN
  Administered 2018-01-04 (×2): 4 mg via INTRAVENOUS

## 2018-01-04 MED ORDER — HEPARIN SOD (PORK) LOCK FLUSH 100 UNIT/ML IV SOLN
INTRAVENOUS | Status: DC | PRN
  Administered 2018-01-04: 500 [IU] via INTRAVENOUS

## 2018-01-04 MED ORDER — MIDAZOLAM HCL 2 MG/2ML IJ SOLN
INTRAMUSCULAR | Status: AC
  Administered 2018-01-04: 2 mg via INTRAVENOUS
  Filled 2018-01-04: qty 2

## 2018-01-04 MED ORDER — ROCURONIUM BROMIDE 10 MG/ML (PF) SYRINGE
PREFILLED_SYRINGE | INTRAVENOUS | Status: AC
  Filled 2018-01-04: qty 5

## 2018-01-04 MED ORDER — ROCURONIUM BROMIDE 100 MG/10ML IV SOLN
INTRAVENOUS | Status: DC | PRN
  Administered 2018-01-04: 10 mg via INTRAVENOUS
  Administered 2018-01-04: 20 mg via INTRAVENOUS
  Administered 2018-01-04: 50 mg via INTRAVENOUS
  Administered 2018-01-04: 20 mg via INTRAVENOUS

## 2018-01-04 MED ORDER — HYDROMORPHONE HCL 1 MG/ML IJ SOLN
0.2500 mg | INTRAMUSCULAR | Status: DC | PRN
  Administered 2018-01-04 (×3): 0.5 mg via INTRAVENOUS

## 2018-01-04 MED ORDER — PROPOFOL 10 MG/ML IV BOLUS
INTRAVENOUS | Status: AC
  Filled 2018-01-04: qty 20

## 2018-01-04 MED ORDER — MEPERIDINE HCL 25 MG/ML IJ SOLN: 6.2500 mg | INTRAMUSCULAR | Status: DC | PRN

## 2018-01-04 MED ORDER — DEXAMETHASONE SODIUM PHOSPHATE 10 MG/ML IJ SOLN
INTRAMUSCULAR | Status: DC | PRN
  Administered 2018-01-04: 10 mg via INTRAVENOUS

## 2018-01-04 MED ORDER — LIDOCAINE 2% (20 MG/ML) 5 ML SYRINGE
INTRAMUSCULAR | Status: AC
  Filled 2018-01-04: qty 5

## 2018-01-04 MED ORDER — BUPIVACAINE-EPINEPHRINE (PF) 0.5% -1:200000 IJ SOLN
INTRAMUSCULAR | Status: AC
  Filled 2018-01-04: qty 30

## 2018-01-04 MED ORDER — MIDAZOLAM HCL 2 MG/2ML IJ SOLN
2.0000 mg | Freq: Once | INTRAMUSCULAR | Status: AC
  Administered 2018-01-04: 2 mg via INTRAVENOUS

## 2018-01-04 MED ORDER — BUPIVACAINE-EPINEPHRINE (PF) 0.5% -1:200000 IJ SOLN
INTRAMUSCULAR | Status: DC | PRN
  Administered 2018-01-04: 30 mL

## 2018-01-04 MED ORDER — FENTANYL CITRATE (PF) 100 MCG/2ML IJ SOLN
INTRAMUSCULAR | Status: DC | PRN
  Administered 2018-01-04 (×3): 50 ug via INTRAVENOUS
  Administered 2018-01-04: 100 ug via INTRAVENOUS
  Administered 2018-01-04: 50 ug via INTRAVENOUS

## 2018-01-04 MED ORDER — TRAMADOL HCL 50 MG PO TABS
50.0000 mg | ORAL_TABLET | Freq: Four times a day (QID) | ORAL | Status: DC | PRN
  Administered 2018-01-04 – 2018-01-05 (×2): 50 mg via ORAL
  Filled 2018-01-04 (×2): qty 1

## 2018-01-04 MED ORDER — MIDAZOLAM HCL 2 MG/2ML IJ SOLN
INTRAMUSCULAR | Status: AC
  Filled 2018-01-04: qty 2

## 2018-01-04 MED ORDER — ENOXAPARIN SODIUM 40 MG/0.4ML ~~LOC~~ SOLN
40.0000 mg | SUBCUTANEOUS | Status: DC
  Administered 2018-01-05: 40 mg via SUBCUTANEOUS
  Filled 2018-01-04: qty 0.4

## 2018-01-04 MED ORDER — ANASTROZOLE 1 MG PO TABS
1.0000 mg | ORAL_TABLET | Freq: Every evening | ORAL | Status: DC
  Administered 2018-01-04: 1 mg via ORAL
  Filled 2018-01-04: qty 1

## 2018-01-04 MED ORDER — PROPOFOL 10 MG/ML IV BOLUS
INTRAVENOUS | Status: DC | PRN
  Administered 2018-01-04: 140 mg via INTRAVENOUS

## 2018-01-04 MED ORDER — ONDANSETRON HCL 4 MG/2ML IJ SOLN
4.0000 mg | Freq: Four times a day (QID) | INTRAMUSCULAR | Status: DC | PRN
  Administered 2018-01-04: 4 mg via INTRAVENOUS
  Filled 2018-01-04: qty 2

## 2018-01-04 MED ORDER — PHENYLEPHRINE HCL 10 MG/ML IJ SOLN
INTRAMUSCULAR | Status: DC | PRN
  Administered 2018-01-04 (×2): 80 ug via INTRAVENOUS

## 2018-01-04 MED ORDER — PHENYLEPHRINE 40 MCG/ML (10ML) SYRINGE FOR IV PUSH (FOR BLOOD PRESSURE SUPPORT)
PREFILLED_SYRINGE | INTRAVENOUS | Status: AC
  Filled 2018-01-04: qty 10

## 2018-01-04 MED ORDER — ONDANSETRON HCL 4 MG/2ML IJ SOLN
INTRAMUSCULAR | Status: AC
  Filled 2018-01-04: qty 2

## 2018-01-04 MED ORDER — ONDANSETRON 4 MG PO TBDP: 4.0000 mg | ORAL_TABLET | Freq: Four times a day (QID) | ORAL | Status: DC | PRN

## 2018-01-04 MED ORDER — LIDOCAINE HCL (CARDIAC) 20 MG/ML IV SOLN
INTRAVENOUS | Status: DC | PRN
  Administered 2018-01-04: 100 mg via INTRAVENOUS

## 2018-01-04 SURGICAL SUPPLY — 62 items
ADH SKN CLS APL DERMABOND .7 (GAUZE/BANDAGES/DRESSINGS) ×8
BAG DECANTER FOR FLEXI CONT (MISCELLANEOUS) ×3 IMPLANT
BINDER BREAST XLRG (GAUZE/BANDAGES/DRESSINGS) ×1 IMPLANT
BIOPATCH RED 1 DISK 7.0 (GAUZE/BANDAGES/DRESSINGS) ×2 IMPLANT
CHLORAPREP W/TINT 10.5 ML (MISCELLANEOUS) ×3 IMPLANT
COVER PROBE W GEL 5X96 (DRAPES) ×1 IMPLANT
COVER SURGICAL LIGHT HANDLE (MISCELLANEOUS) ×3 IMPLANT
COVER TRANSDUCER ULTRASND GEL (DRAPE) ×1 IMPLANT
CRADLE DONUT ADULT HEAD (MISCELLANEOUS) ×3 IMPLANT
DECANTER SPIKE VIAL GLASS SM (MISCELLANEOUS) IMPLANT
DERMABOND ADVANCED (GAUZE/BANDAGES/DRESSINGS) ×4
DERMABOND ADVANCED .7 DNX12 (GAUZE/BANDAGES/DRESSINGS) ×2 IMPLANT
DEVICE DISSECT PLASMABLAD 3.0S (MISCELLANEOUS) IMPLANT
DRAIN CHANNEL 19F RND (DRAIN) ×2 IMPLANT
DRAPE C-ARM 42X72 X-RAY (DRAPES) ×3 IMPLANT
DRAPE CHEST BREAST 15X10 FENES (DRAPES) ×4 IMPLANT
DRAPE SURG 17X23 STRL (DRAPES) ×4 IMPLANT
DRAPE UTILITY XL STRL (DRAPES) ×4 IMPLANT
DRSG PAD ABDOMINAL 8X10 ST (GAUZE/BANDAGES/DRESSINGS) ×2 IMPLANT
ELECT CAUTERY BLADE 6.4 (BLADE) ×3 IMPLANT
ELECT REM PT RETURN 9FT ADLT (ELECTROSURGICAL) ×3
ELECTRODE REM PT RTRN 9FT ADLT (ELECTROSURGICAL) ×2 IMPLANT
GAUZE SPONGE 4X4 16PLY XRAY LF (GAUZE/BANDAGES/DRESSINGS) ×2 IMPLANT
GEL ULTRASOUND 20GR AQUASONIC (MISCELLANEOUS) IMPLANT
GLOVE BIOGEL PI IND STRL 8 (GLOVE) ×2 IMPLANT
GLOVE BIOGEL PI INDICATOR 8 (GLOVE) ×1
GLOVE ECLIPSE 7.5 STRL STRAW (GLOVE) ×3 IMPLANT
GOWN STRL REUS W/ TWL LRG LVL3 (GOWN DISPOSABLE) ×2 IMPLANT
GOWN STRL REUS W/ TWL XL LVL3 (GOWN DISPOSABLE) ×2 IMPLANT
GOWN STRL REUS W/TWL LRG LVL3 (GOWN DISPOSABLE) ×3
GOWN STRL REUS W/TWL XL LVL3 (GOWN DISPOSABLE) ×3
INTRODUCER COOK 11FR (CATHETERS) IMPLANT
IV CATH 14GX2 1/4 (CATHETERS) IMPLANT
KIT BASIN OR (CUSTOM PROCEDURE TRAY) ×3 IMPLANT
KIT PORT POWER 8FR ISP CVUE (Miscellaneous) ×1 IMPLANT
KIT ROOM TURNOVER OR (KITS) ×3 IMPLANT
NDL HYPO 25GX1X1/2 BEV (NEEDLE) ×2 IMPLANT
NEEDLE 22X1 1/2 (OR ONLY) (NEEDLE) ×2 IMPLANT
NEEDLE HYPO 25GX1X1/2 BEV (NEEDLE) ×3 IMPLANT
NS IRRIG 1000ML POUR BTL (IV SOLUTION) ×4 IMPLANT
PACK SURGICAL SETUP 50X90 (CUSTOM PROCEDURE TRAY) ×3 IMPLANT
PAD ARMBOARD 7.5X6 YLW CONV (MISCELLANEOUS) ×3 IMPLANT
PENCIL BUTTON HOLSTER BLD 10FT (ELECTRODE) ×3 IMPLANT
PLASMABLADE 3.0S (MISCELLANEOUS) ×3
SET INTRODUCER 12FR PACEMAKER (SHEATH) IMPLANT
SET SHEATH INTRODUCER 10FR (MISCELLANEOUS) IMPLANT
SHEATH COOK PEEL AWAY SET 9F (SHEATH) IMPLANT
SUT ETHILON 2 0 FS 18 (SUTURE) ×2 IMPLANT
SUT MON AB 5-0 PS2 18 (SUTURE) ×4 IMPLANT
SUT PROLENE 2 0 SH DA (SUTURE) ×3 IMPLANT
SUT SILK 2 0 (SUTURE)
SUT SILK 2-0 18XBRD TIE 12 (SUTURE) IMPLANT
SUT VIC AB 3-0 54X BRD REEL (SUTURE) IMPLANT
SUT VIC AB 3-0 BRD 54 (SUTURE) ×6
SUT VIC AB 3-0 SH 18 (SUTURE) ×2 IMPLANT
SUT VIC AB 3-0 SH 8-18 (SUTURE) ×1 IMPLANT
SYR 20ML ECCENTRIC (SYRINGE) ×6 IMPLANT
SYR 5ML LUER SLIP (SYRINGE) ×3 IMPLANT
SYR BULB 3OZ (MISCELLANEOUS) ×3 IMPLANT
SYR CONTROL 10ML LL (SYRINGE) ×3 IMPLANT
TOWEL OR 17X24 6PK STRL BLUE (TOWEL DISPOSABLE) ×3 IMPLANT
TOWEL OR 17X26 10 PK STRL BLUE (TOWEL DISPOSABLE) ×4 IMPLANT

## 2018-01-04 NOTE — Progress Notes (Signed)
Received patient from PACU accompanied by daughter.  Pt. AOx4, VSS with slightly elevated BP at 160/69, pain at 3/10, 2 JP drains and O2Sat at 95% on RA.  Patient and daughter oriented to room, bed controls and call light. CNA gave water to drink.  Pt nauaseated and administered PRN Zofran IV per order and noted it effective.  Will continue to monitor.  Likewise administered PRN pain medication tramadol due to pain at 4/10.  Pt. Now resting on bed comfortably with daughter at bedside. Will monitor.

## 2018-01-04 NOTE — Anesthesia Procedure Notes (Signed)
Procedure Name: Intubation Date/Time: 01/04/2018 2:09 PM Performed by: Purvis Kilts, CRNA Pre-anesthesia Checklist: Patient identified, Emergency Drugs available, Suction available, Patient being monitored and Timeout performed Patient Re-evaluated:Patient Re-evaluated prior to induction Oxygen Delivery Method: Circle system utilized Preoxygenation: Pre-oxygenation with 100% oxygen Induction Type: IV induction Ventilation: Mask ventilation without difficulty Laryngoscope Size: Mac and 3 Grade View: Grade II Tube type: Oral Tube size: 7.0 mm Number of attempts: 1 Airway Equipment and Method: Stylet Placement Confirmation: ETT inserted through vocal cords under direct vision,  positive ETCO2 and breath sounds checked- equal and bilateral Secured at: 22 cm Tube secured with: Tape Dental Injury: Teeth and Oropharynx as per pre-operative assessment

## 2018-01-04 NOTE — Transfer of Care (Signed)
Immediate Anesthesia Transfer of Care Note  Patient: Samantha Sullivan  Procedure(s) Performed: BILATERAL TOTAL MASTECTOMY PROPHYLATIC RIGHT  WITH LEFT SENTINEAL  LYMPH NODE (Bilateral ) INSERTION PORT-A-CATH (Right )  Patient Location: PACU  Anesthesia Type:GA combined with regional for post-op pain  Level of Consciousness: awake, alert  and oriented  Airway & Oxygen Therapy: Patient Spontanous Breathing and Patient connected to nasal cannula oxygen  Post-op Assessment: Report given to RN and Post -op Vital signs reviewed and stable  Post vital signs: Reviewed and stable  Last Vitals:  Vitals:   01/04/18 1330 01/04/18 1750  BP: (!) 157/53   Pulse: 72   Resp: 19   Temp:  36.6 C  SpO2: 100%     Last Pain:  Vitals:   01/04/18 1151  TempSrc: Oral      Patients Stated Pain Goal: 2 (32/54/98 2641)  Complications: No apparent anesthesia complications

## 2018-01-04 NOTE — Interval H&P Note (Signed)
History and Physical Interval Note:  01/04/2018 1:35 PM  [name]  has presented today for surgery, with the diagnosis of cancer left breast  The various methods of treatment have been discussed with the patient and family. After consideration of risks, benefits and other options for treatment, the patient has consented to  Procedure(s): BILATERAL TOTAL MASTECTOMY PROPHYLATIC RIGHT  WITH LEFT SENTINEAL  LYMPH NODE (Bilateral) INSERTION PORT-A-CATH (N/A) as a surgical intervention .  The patient's history has been reviewed, patient examined, no change in status, stable for surgery.  I have reviewed the patient's chart and labs.  Questions were answered to the patient's satisfaction.     Darene Lamer Malaky Tetrault

## 2018-01-04 NOTE — Anesthesia Preprocedure Evaluation (Signed)
Anesthesia Evaluation  Patient identified by MRN, date of birth, ID band Patient awake    Reviewed: Allergy & Precautions, NPO status , Patient's Chart, lab work & pertinent test results  Airway Mallampati: II  TM Distance: >3 FB Neck ROM: Full    Dental  (+) Teeth Intact   Pulmonary former smoker,    breath sounds clear to auscultation       Cardiovascular negative cardio ROS   Rhythm:Regular Rate:Normal     Neuro/Psych PSYCHIATRIC DISORDERS Depression negative neurological ROS     GI/Hepatic negative GI ROS, Neg liver ROS,   Endo/Other  negative endocrine ROS  Renal/GU negative Renal ROS     Musculoskeletal  (+) Arthritis ,   Abdominal   Peds  Hematology negative hematology ROS (+)   Anesthesia Other Findings cancer left breast  Reproductive/Obstetrics                             Lab Results  Component Value Date   WBC 5.5 08/05/2017   HGB 14.0 08/05/2017   HCT 41.5 08/05/2017   MCV 89.9 08/05/2017   PLT 241 08/05/2017   Lab Results  Component Value Date   CREATININE 0.8 08/05/2017   BUN 13.4 08/05/2017   NA 141 08/05/2017   K 4.5 08/05/2017   CO2 29 08/05/2017   No results found for: INR, PROTIME   Anesthesia Physical  Anesthesia Plan  ASA: II  Anesthesia Plan: General and Regional   Post-op Pain Management: GA combined w/ Regional for post-op pain   Induction: Intravenous  PONV Risk Score and Plan: 3 and Midazolam, Dexamethasone and Ondansetron  Airway Management Planned: LMA  Additional Equipment:   Intra-op Plan:   Post-operative Plan: Extubation in OR  Informed Consent: I have reviewed the patients History and Physical, chart, labs and discussed the procedure including the risks, benefits and alternatives for the proposed anesthesia with the patient or authorized representative who has indicated his/her understanding and acceptance.   Dental advisory  given  Plan Discussed with: CRNA  Anesthesia Plan Comments:         Anesthesia Quick Evaluation

## 2018-01-04 NOTE — Anesthesia Postprocedure Evaluation (Signed)
Anesthesia Post Note  Patient: Samantha Sullivan  Procedure(s) Performed: BILATERAL TOTAL MASTECTOMY PROPHYLATIC RIGHT  WITH LEFT SENTINEAL  LYMPH NODE (Bilateral ) INSERTION PORT-A-CATH (Right )     Patient location during evaluation: PACU Anesthesia Type: Regional and General Level of consciousness: sedated, patient cooperative and oriented Pain management: pain level controlled Vital Signs Assessment: post-procedure vital signs reviewed and stable Respiratory status: spontaneous breathing, nonlabored ventilation, respiratory function stable and patient connected to nasal cannula oxygen Cardiovascular status: blood pressure returned to baseline and stable Postop Assessment: no apparent nausea or vomiting Anesthetic complications: no    Last Vitals:  Vitals:   01/04/18 1900 01/04/18 1911  BP: (!) 152/73   Pulse: 63 67  Resp: 13 16  Temp:  36.5 C  SpO2: 96% 97%    Last Pain:  Vitals:   01/04/18 1900  TempSrc:   PainSc: Asleep                 Taevyn Hausen,E. Merced Brougham

## 2018-01-04 NOTE — Anesthesia Procedure Notes (Signed)
Anesthesia Regional Block: Pectoralis block   Pre-Anesthetic Checklist: ,, timeout performed, Correct Patient, Correct Site, Correct Laterality, Correct Procedure, Correct Position, site marked, Risks and benefits discussed,  Surgical consent,  Pre-op evaluation,  At surgeon's request and post-op pain management  Laterality: Left and Right  Prep: chloraprep       Needles:  Injection technique: Single-shot     Needle Length: 9cm  Needle Gauge: 21     Additional Needles:   Narrative:  Start time: 01/04/2018 1:05 PM End time: 01/04/2018 1:20 PM Injection made incrementally with aspirations every 5 mL.  Performed by: Personally  Anesthesiologist: Lillia Abed, MD  Additional Notes: Monitors applied. Patient sedated. Sterile prep and drape,hand hygiene and sterile gloves were used.On both sides, relevant anatomy identified.Needle position confirmed.Local anesthetic injected incrementally after negative aspiration. Local anesthetic spread visualized. Vascular puncture avoided. No complications. Images printed for medical record.The patient tolerated the procedure well.

## 2018-01-04 NOTE — Op Note (Signed)
Preoperative Diagnosis: cancer left breast  Postoprative Diagnosis: cancer left breast  Procedure: Procedure(s): BILATERAL TOTAL MASTECTOMY PROPHYLATIC RIGHT  WITH DEEP LEFT AXILLARY SENTINEAL  LYMPH NODE biopsy INSERTION PORT-A-CATH, ultrasound guidance   Surgeon: Excell Seltzer T   Assistants: RNFA  Anesthesia:  General endotracheal anesthesia  Indications: 67 year old female with a new diagnosis of cancer of the left breast, lower inner quadrant. Clinical stage 1B, ER 40% weakly positive, PR negative, HER-2 negative by IHC.  Personal history of right breast cancer treated with lumpectomy and radiation and antiestrogen therapy diagnosed 1 year ago.  After extensive preoperative workup and discussion detailed elsewhere she has elected to proceed with bilateral total mastectomy with left axillary sentinel lymph node biopsy.  She will require adjuvant chemotherapy and Port-A-Cath placement is planned as well.   Procedure Detail: In the holding area she underwent injection of 1 mCi of technetium sulfur colloid intradermally around the left nipple.  Underwent bilateral pectoral block by anesthesia.  She was taken to the operating room, placed in supine position on the operating table, and general endotracheal anesthesia induced.  She was carefully positioned with arms extended.  Received preoperative IV antibiotics.  PAS were in place.  The entire anterior chest and axilla and upper arms were widely sterilely prepped and draped.  Patient timeout was performed and correct procedure verified.  The right simple mastectomy was approached initially.  A transversely oriented elliptical incision encompassing the nipple areolar complex was made with the plasma blade.  Skin and subcutaneous flaps were then developed superiorly toward the clavicle, medially to the edge of the sternum, inferiorly to the inframammary crease and laterally out to the anterior border of the latissimus dorsi which was defined.   The dissection was deepened down onto the chest wall.  The breast was then dissected up off the chest wall with the plasma blade working medial to lateral.  It was freed from attachments to the lateral pectoralis and to the serratus.  Inferiorly the specimen was dissected up off the anterior border of latissimus.  Clavipectoral fascia was incised and the pectoralis minor mobilized and dissection carried into the low axilla.  At this point I came across the low axilla with Kelly clamps and the specimen was removed.  These were tied with 3-0 Vicryl.  The specimen was oriented and sent for permanent pathology.  The wound was thoroughly irrigated and hemostasis assured.  A 19 Blake closed suction drain was left through a separate stab wound and placed under both flaps.  The subtenons tissue was closed with interrupted 3-0 Vicryl and skin closed with running 5-0 Monocryl. Attention was then turned to the left mastectomy.  An identical incision and initial dissection was performed.  As the dissection isolated the tail of Spence and axilla the clavipectoral fascia was again incised.  Using the neoprobe for guidance identified to somewhat enlarged but soft deep axillary lymph nodes which were dissected out with the plasma blade and the hilum clamped and these were both excised as axillary sentinel lymph nodes.  Carefully palpating the rest of the axillary contents there was one further slightly enlarged node also soft that was excised and did not have elevated counts.  At this point there were no counts in the axilla and no palpable adenopathy.  I then came across the low axilla identically to the other side and the specimen was removed.  This wound was closed identically to the right side.  Both skin incisions were closed with running subcuticular 5-0 Monocryl  and Dermabond.  Following this the patient was repositioned with arms tucked and the entire anterior neck and upper chest sterilely prepped and draped.  Patient  timeout was again performed.  Ultrasound was used to localize the right internal jugular vein.  Under continuous ultrasound visualization the right internal jugular vein was cannulated with a needle and guidewire.  The introducer was placed over the guidewire and the flush catheter was placed through the introducer which was stripped away.  The tip of the catheter was positioned in the SVC under fluoroscopic guidance.  It flushed and aspirated easily with normal venous pressure.  Slight on the right anterior chest wall was chosen for the port and a small transverse incision made and subtenons pocket created.  The catheter was tunneled subcutaneously to the pocket, trimmed to length and attached to the port which was sutured to the chest wall with interrupted 2-0 Prolene.  Fluoroscopy again showed the catheter in good position.  Skin incisions were closed with subcutaneous 3-0 Vicryl and subarticular 5-0 Monocryl and Dermabond.  The port was accessed and flushed and aspirated easily and was left flushed with concentrated heparin flush.  Sponge needle and instrument counts were correct.    Findings: As above  Estimated Blood Loss:  less than 100 mL         Drains: 19 round Blake drains x2  Blood Given: none          Specimens: #1 right total mastectomy   #2 left total mastectomy   #3 left axillary sentinel lymph nodes X 3        Complications:  * No complications entered in OR log *         Disposition: PACU - hemodynamically stable.         Condition: stable

## 2018-01-05 ENCOUNTER — Encounter (HOSPITAL_COMMUNITY): Payer: Self-pay | Admitting: General Surgery

## 2018-01-05 DIAGNOSIS — C50312 Malignant neoplasm of lower-inner quadrant of left female breast: Secondary | ICD-10-CM | POA: Diagnosis not present

## 2018-01-05 LAB — CBC
HEMATOCRIT: 38.6 % (ref 36.0–46.0)
HEMOGLOBIN: 13.1 g/dL (ref 12.0–15.0)
MCH: 30.5 pg (ref 26.0–34.0)
MCHC: 33.9 g/dL (ref 30.0–36.0)
MCV: 89.8 fL (ref 78.0–100.0)
PLATELETS: 280 10*3/uL (ref 150–400)
RBC: 4.3 MIL/uL (ref 3.87–5.11)
RDW: 13.3 % (ref 11.5–15.5)
WBC: 13.5 10*3/uL — ABNORMAL HIGH (ref 4.0–10.5)

## 2018-01-05 MED ORDER — TRAMADOL HCL 50 MG PO TABS: 50.0000 mg | ORAL_TABLET | Freq: Four times a day (QID) | ORAL | 0 refills | Status: DC | PRN

## 2018-01-05 MED ORDER — PHENOL 1.4 % MT LIQD
1.0000 | OROMUCOSAL | Status: DC | PRN
  Administered 2018-01-05: 1 via OROMUCOSAL
  Filled 2018-01-05: qty 177

## 2018-01-05 NOTE — Progress Notes (Signed)
Patient ID: Samantha Sullivan, female   DOB: 1951-11-28, 67 y.o.   MRN: [id number] 1 Day Post-Op   Subjective: Doing well this morning.  Some nausea last night that resolved.  Mild pain underarms.  Objective: Vital signs in last 24 hours: Temp:  [97.7 F (36.5 C)-99 F (37.2 C)] 99 F (37.2 C) (01/15 0635) Pulse Rate:  [63-92] 65 (01/15 0635) Resp:  [13-27] 16 (01/15 0635) BP: (133-176)/(43-86) 146/57 (01/15 0635) SpO2:  [92 %-100 %] 97 % (01/15 0635) Weight:  [85.7 kg (188 lb 14.4 oz)] 85.7 kg (188 lb 14.4 oz) (01/14 1151)    Intake/Output from previous day: 01/14 0701 - 01/15 0700 In: 2139.3 [P.O.:300; I.V.:1839.3] Out: 155 [Drains:105; Blood:50] Intake/Output this shift: Total I/O In: 839.3 [P.O.:300; I.V.:539.3] Out: 105 [Drains:105]  General appearance: alert, cooperative and no distress Incision/Wound: Skin flaps healthy.  No hematoma.  Drainage serosanguineous.  Lab Results:  Recent Labs    01/04/18 1212  WBC 6.2  HGB 13.8  HCT 40.8  PLT 258   BMET Recent Labs    01/04/18 1212  NA 139  K 4.1  CL 107  CO2 24  GLUCOSE 92  BUN 20  CREATININE 0.79  CALCIUM 9.4     Studies/Results: Nm Sentinel Node Inj-no Rpt (breast)  Result Date: 01/04/2018 Sulfur colloid was injected by the nuclear medicine technologist for melanoma sentinel node.   Dg Chest Port 1 View  Result Date: 01/04/2018 CLINICAL DATA:  Post bilateral mastectomy. EXAM: PORTABLE CHEST 1 VIEW COMPARISON:  Chest CT - 02/15/2008 FINDINGS: Normal cardiac silhouette and mediastinal contours given patient rotation. Right jugular approach port a catheter tip terminates over the mid SVC no focal airspace opacities. No pleural effusion or pneumothorax. No evidence of edema. No acute osseus abnormalities. Subcutaneous drainage catheters overlie the chest bilaterally. Note is made of a minimal amount of subcutaneous emphysema about the bilateral drainage catheters. No radiopaque foreign body. IMPRESSION: 1.   No acute cardiopulmonary disease. 2. Right jugular approach port a catheter tip terminates over the mid SVC. No pneumothorax. 3. Bilateral chest wall subcutaneous drains. Electronically Signed   By: Sandi Mariscal M.D.   On: 01/04/2018 20:12   Dg Fluoro Guide Cv Line-no Report  Result Date: 01/04/2018 Fluoroscopy was utilized by the requesting physician.  No radiographic interpretation.    Anti-infectives: Anti-infectives (From admission, onward)   Start     Dose/Rate Route Frequency Ordered Stop   01/04/18 0952  ceFAZolin (ANCEF) IVPB 2g/100 mL premix     2 g 200 mL/hr over 30 Minutes Intravenous On call to O.R. 01/04/18 0953 01/04/18 1406      Assessment/Plan: s/p Procedure(s): BILATERAL TOTAL MASTECTOMY PROPHYLATIC RIGHT  WITH LEFT SENTINEAL  LYMPH NODE INSERTION PORT-A-CATH Doing well postoperatively without complication.  Okay for discharge today   LOS: 0 days    Edward Jolly 01/05/2018

## 2018-01-05 NOTE — Progress Notes (Signed)
Pt is tolerating regular diet, Bil chest incision clean and dry. Drain care done, demonstrated emptying to pt and daughter. Discharge instructions given to pt, discharged to home accompanied by daughter.

## 2018-01-05 NOTE — Discharge Summary (Signed)
  Patient ID: Samantha Sullivan [id number] y.o. 1951-08-05  01/04/2018  Discharge date and time: 01/05/2018   Admitting Physician: Edward Jolly  Discharge Physician: Edward Jolly  Admission Diagnoses: cancer left breast  Discharge Diagnoses: Same  Operations: Procedure(s): BILATERAL TOTAL MASTECTOMY PROPHYLATIC RIGHT  WITH LEFT SENTINEAL  LYMPH NODE INSERTION PORT-A-CATH  Admission Condition: good  Discharged Condition: good  Indication for Admission: She now presents with a new diagnosis of cancer of the left breast, lower inner quadrant. Clinical stage 1B, ER 40% weakly positive, PR negative, HER-2 negative by IHC.  Personal history of right breast cancer 1 year ago status post lumpectomy and on Arimidex.  After extensive preoperative discussion detailed elsewhere she is elected to proceed with bilateral total mastectomy and left axillary sentinel lymph node biopsy placement of Port-A-Cath that she will require chemotherapy.    Hospital Course: Patient on the morning of admission an uneventful bilateral total mastectomy with left axillary sentinel lymph node biopsy placement of Port-A-Cath.  Postoperative course uncomplicated.  Chest x-ray showed good position.  Following morning she is comfortable.  No evidence of bleeding.  Vital signs stable.  Wounds healing well healthy skin flaps.  She is felt ready for discharge.   Disposition: Home  Patient Instructions:  Allergies as of 01/05/2018      Reactions   Bee Venom    Norco [hydrocodone-acetaminophen] Other (See Comments)   Pt states Norco made her extremely hyper after her surgery and she was not able to sleep.      Medication List    TAKE these medications   anastrozole 1 MG tablet Commonly known as:  ARIMIDEX Take 1 mg by mouth every evening.   FLURANDRENOLIDE EX Apply 1 application topically 2 (two) times daily as needed (for psoriasis). Cordran Tape   folic acid 1 MG tablet Commonly known as:   FOLVITE Take 2 mg by mouth every evening.   LUBRICANT EYE DROPS 0.4-0.3 % Soln Generic drug:  Polyethyl Glycol-Propyl Glycol Place 1-2 drops into both eyes 3 (three) times daily as needed (dry eyes/irritated eyes.).   traMADol 50 MG tablet Commonly known as:  ULTRAM Take 1 tablet (50 mg total) by mouth every 6 (six) hours as needed (mild pain).       Activity: activity as tolerated Diet: regular diet Wound Care: And emptying JP drain daily.  May shower.  Follow-up:  With Demontrae Gilbert in 1 week.  Signed: Edward Jolly MD, FACS  01/05/2018, 6:48 AM

## 2018-01-05 NOTE — Discharge Instructions (Signed)
[facility] [place] °[phone] ° °MASTECTOMY: POST OP INSTRUCTIONS ° °Always review your discharge instruction sheet given to you by the facility where your surgery was performed. °IF YOU HAVE DISABILITY OR FAMILY LEAVE FORMS, YOU MUST BRING THEM TO THE OFFICE FOR PROCESSING.   °DO NOT GIVE THEM TO YOUR DOCTOR. °A prescription for pain medication may be given to you upon discharge.  Take your pain medication as prescribed, if needed.  If narcotic pain medicine is not needed, then you may take acetaminophen (Tylenol) or ibuprofen (Advil) as needed. °1. Take your usually prescribed medications unless otherwise directed. °2. If you need a refill on your pain medication, please contact your pharmacy.  They will contact our office to request authorization.  Prescriptions will not be filled after 5pm or on week-ends. °3. You should follow a light diet the first few days after arrival home, such as soup and crackers, etc.  Resume your normal diet the day after surgery. °4. Most patients will experience some swelling and bruising on the chest and underarm.  Ice packs will help.  Swelling and bruising can take several days to resolve.  °5. It is common to experience some constipation if taking pain medication after surgery.  Increasing fluid intake and taking a stool softener (such as Colace) will usually help or prevent this problem from occurring.  A mild laxative (Milk of Magnesia or Miralax) should be taken according to package instructions if there are no bowel movements after 48 hours. °6. Unless discharge instructions indicate otherwise, leave your bandage dry and in place until your next appointment in 3-5 days.  You may take a limited sponge bath.  No tube baths or showers until the drains are removed.  You may have steri-strips (small skin tapes) in place directly over the incision.  These strips should be left on the skin for 7-10 days.  If your surgeon used skin glue on the incision, you may  shower in 24 hours.  The glue will flake off over the next 2-3 weeks.  Any sutures or staples will be removed at the office during your follow-up visit. °7. DRAINS:  If you have drains in place, it is important to keep a list of the amount of drainage produced each day in your drains.  Before leaving the hospital, you should be instructed on drain care.  Call our office if you have any questions about your drains. °8. ACTIVITIES:  You may resume regular (light) daily activities beginning the next day--such as daily self-care, walking, climbing stairs--gradually increasing activities as tolerated.  You may have sexual intercourse when it is comfortable.  Refrain from any heavy lifting or straining until approved by your doctor. °a. You may drive when you are no longer taking prescription pain medication, you can comfortably wear a seatbelt, and you can safely maneuver your car and apply brakes. °b. RETURN TO WORK:  __________________________________________________________ °9. You should see your doctor in the office for a follow-up appointment approximately 3-5 days after your surgery.  Your doctor’s nurse will typically make your follow-up appointment when she calls you with your pathology report.  Expect your pathology report 2-3 business days after your surgery.  You may call to check if you do not hear from us after three days.   °10. OTHER INSTRUCTIONS: ______________________________________________________________________________________________ ____________________________________________________________________________________________ °WHEN TO CALL YOUR DOCTOR: °1. Fever over 101.0 °2. Nausea and/or vomiting °3. Extreme swelling or bruising °4. Continued bleeding from incision. °5. Increased pain, redness, or drainage from the incision. °  The clinic staff is available to answer your questions during regular business hours.  Please don’t hesitate to call and ask to speak to one of the nurses for clinical  concerns.  If you have a medical emergency, go to the nearest emergency room or call 911.  A surgeon from [facility] is always on call at the hospital. °[address], [address], [place], [place]  [address] ? [address] [address], [place], [place]   [address] °[phone] ? [phone] ? FAX [phone] °Web site: [email or web address] °

## 2018-01-11 ENCOUNTER — Other Ambulatory Visit: Payer: Self-pay | Admitting: Oncology

## 2018-01-11 ENCOUNTER — Ambulatory Visit: Payer: PPO | Admitting: Nurse Practitioner

## 2018-01-11 DIAGNOSIS — C50812 Malignant neoplasm of overlapping sites of left female breast: Secondary | ICD-10-CM

## 2018-01-11 NOTE — Progress Notes (Unsigned)
I called Samantha Sullivan today to set up the next set of plans.  She had bilateral mastectomies 01/04/2018 under Dr. Excell Seltzer.  On the right there was no evidence of malignancy.  On the left there was a 2.2 cm invasive ductal carcinoma, grade 3, with 3 benign lymph nodes.  We are waiting on an Oncotype and I am also requesting a repeat prognostic panel--requested 01/11/2018  She still has her drains in place.  There are scheduled to come out to 01/10/2018.  I will see if I can get an echo scheduled for 01/25/2018, chemo school around that time, and then to see me 01/29/2018 to discuss chemo, the plan being for CA followed by T.

## 2018-01-13 ENCOUNTER — Telehealth: Payer: Self-pay | Admitting: Oncology

## 2018-01-13 NOTE — Telephone Encounter (Signed)
Scheduled appts per 1/21 sch msg - spoke with patient and confirmed appts

## 2018-01-19 ENCOUNTER — Encounter (HOSPITAL_COMMUNITY): Payer: Self-pay | Admitting: General Surgery

## 2018-01-25 ENCOUNTER — Other Ambulatory Visit: Payer: Self-pay

## 2018-01-25 ENCOUNTER — Ambulatory Visit (HOSPITAL_COMMUNITY): Payer: PPO | Attending: Cardiovascular Disease

## 2018-01-25 DIAGNOSIS — Z9013 Acquired absence of bilateral breasts and nipples: Secondary | ICD-10-CM | POA: Insufficient documentation

## 2018-01-25 DIAGNOSIS — C50812 Malignant neoplasm of overlapping sites of left female breast: Secondary | ICD-10-CM | POA: Diagnosis not present

## 2018-01-25 LAB — ECHOCARDIOGRAM COMPLETE
E decel time: 261 msec
EERAT: 9.1
FS: 34 % (ref 28–44)
IVS/LV PW RATIO, ED: 0.83
LA diam index: 1.76 cm/m2
LA vol A4C: 42.2 ml
LA vol index: 23.4 mL/m2
LASIZE: 34 mm
LAVOL: 45.2 mL
LEFT ATRIUM END SYS DIAM: 34 mm
LV E/e' medial: 9.1
LV TDI E'MEDIAL: 8.92
LV e' LATERAL: 13.3 cm/s
LVEEAVG: 9.1
LVOT area: 3.14 cm2
LVOT diameter: 20 mm
MV Dec: 261
MV pk A vel: 108 m/s
MVPG: 6 mmHg
MVPKEVEL: 121 m/s
PW: 11.1 mm — AB (ref 0.6–1.1)
RV LATERAL S' VELOCITY: 12.7 cm/s
RV sys press: 31 mmHg
Reg peak vel: 240 cm/s
TDI e' lateral: 13.3
TR max vel: 240 cm/s

## 2018-01-26 NOTE — Progress Notes (Signed)
Dear  [facility]  [phone] [phone]     ID: Samantha Sullivan DOB: 06-05-1951  MR#: [id number]  ZCH#:[id number]  Patient Care Team: Shon Baton, MD as PCP - General (Internal Medicine) Rebecca Cairns, Virgie Dad, MD as Consulting Physician (Oncology) Kem Boroughs, [place] as Nurse Practitioner (Family Medicine) Regal, Tamala Fothergill, DPM as Consulting Physician (Podiatry) Excell Seltzer, MD as Consulting Physician (General Surgery) PCP: Shon Baton, MD OTHER MD:  CHIEF COMPLAINT: Contralateral breast cancer, triple negative  CURRENT TREATMENT: Adjuvant chemotherapy  INTERVAL HISTORY: Samantha Sullivan returns today for follow-up of her triple negative breast cancer since her last visit here she underwent bilateral mastectomies.  She notes that following surgery, she has had mild misery with the healing process. She has a tight band sensation across her chest. She has had her drains removed a week ago and she now has fluid pockets to the area. She saw a Physical Therapist yesterday and she now has cording and pitting to her left arm following surgery. She doesn't notice a difference with the use of a compression sleeve. She denies pain, but reports that she has discomfort and occasional shooting pains. She has bilateral axilla pain. She hasn't tried anything for her pain. She doesn't have an upcoming appointment with her surgeon.   She continues on anastrozole, without any acute issues at this time.   More specifically she underwent a total bilateral mastectomy on 01/04/18 performed by Dr. Excell Seltzer. [id number]) with results of: 1. Breast, simple mastectomy, right with fibrosis with focal resolving fat necrosis consistent with previous lumpectomy site. Fibrocystic changes. No evidence of malignancy. Breast, simple mastectomy, left with invasive and in situ ductal carcinoma, 2.2 cm. Margins not involved. Previous biopsy site clip. Fibrocystic changes. Of the 3 lymph nodes  biopsied, all were negative for malignancy. Prognostic indicators significant for: ER and PR negative. HER2 negative.    REVIEW OF SYSTEMS: Samantha Sullivan reports that for exercise, she is still walking. She reports that her family has been super supportive through this process. Her daughter came by for 8 days for her surgery. Her daughter is against chemotherapy because she is holistic in her medical measures. She reports that she has gotten better with seeing the appearance of her chest wall at this time. She denies wanting reconstruction surgery at this time. She denies any issues with her port, but reports that she has bump to the area at this time that she is unsure about. She denies neck pain. She denies unusual headaches, visual changes, nausea, vomiting, or dizziness. There has been no unusual cough, phlegm production, or pleurisy. This been no change in bowel or bladder habits. She denies unexplained fatigue or unexplained weight loss, bleeding, rash, or fever. A detailed review of systems was otherwise stable.    RIGHT BREAST CANCER HISTORY:  From the original intake note:  "[name]"noted a lump in her right breast sometime in late October or early November, but there were "so many things going on in her life" she did not bring it to medical attention until she saw [name] 12/11/2015. [name] was able to palpate a mass at the 9:30 o'clock position in the right breast, which was mobile and nontender. The left breast is status post prior remote biopsy and there was some scar tissue at the 7:00 position. The patient was set up for bilateral diagnostic Mammography with tomosynthesis at [facility] 12/27/2015. This found the breast density to be category C. There was a new oval mass in the right breast upper  outer quadrant. Ultrasound the same day confirmed a 2.2 cm mass with irregular margins in the right breast at the 9:00 position read as highly suggestive of malignancy.  Biopsy of the right breast mass in  question 01/02/2016 found ([id number]) an invasive ductal carcinoma, grade 1, estrogen receptor 100% positive, progesterone receptor 90% positive, both with strong staining intensity, with an MIB-1 of 10%, and no HER-2 amplification, the signals ratio being 1.32 and the number per cell 2.05. The patient was informed and instructed to stop hormone replacement therapy (last dose 01/04/2016).  Her subsequent history is as detailed below.    PAST MEDICAL HISTORY: Past Medical History:  Diagnosis Date  . Abnormal CT of the chest 12/06   numeroous lung nodules - most have resolved.  . Arthritis    psoriatic arthritis- toes & feet, but resolved at this point- no need meds at this point    . Cancer [facility])    right breast cancer  . Complication of anesthesia    /w colonoscopy- woke up before procedure was complete  . Family history of adverse reaction to anesthesia    pt's mother reported difficulty being given " enough " medicine to outcome a successful anesth. experience  . Family history of leukemia   . Family history of ovarian cancer   . History of meningioma 2007   in 2013 stable on MRI and recheck in 5 years, no surgery necessry   . HSV infection 1970's- 1980's  . Psoriatic arthritis ([place])     PAST SURGICAL HISTORY: Past Surgical History:  Procedure Laterality Date  . BILATERAL TOTAL MASTECTOMY WITH AXILLARY LYMPH NODE DISSECTION Bilateral 01/04/2018   Procedure: BILATERAL TOTAL MASTECTOMY PROPHYLATIC RIGHT  WITH LEFT SENTINEAL  LYMPH NODE;  Surgeon: Excell Seltzer, MD;  Location: [place];  Service: General;  Laterality: Bilateral;  . BREAST LUMPECTOMY WITH RADIOACTIVE SEED AND SENTINEL LYMPH NODE BIOPSY Right 01/22/2016   Procedure: RIGHT BREAST LUMPECTOMY WITH RADIOACTIVE SEED AND SENTINEL LYMPH NODE BIOPSY;  Surgeon: Excell Seltzer, MD;  Location: [place];  Service: General;  Laterality: Right;  . BREAST SURGERY Left 1971, 2016   lumpectomy benign, 2016- [facility]. Ca-  R lumpectomy & radiation   . COLONOSCOPY  8/06   normal and recheck in 10 years  . HAMMER TOE SURGERY Left 2016  . MASTECTOMY Right 01/04/2018   PROPHYLATIC   . MASTECTOMY COMPLETE / SIMPLE W/ SENTINEL NODE BIOPSY Left 01/04/2018  . PORTACATH PLACEMENT Right 01/04/2018   Procedure: INSERTION PORT-A-CATH;  Surgeon: Excell Seltzer, MD;  Location: [place];  Service: General;  Laterality: Right;  . THYROIDECTOMY, PARTIAL Left 1983    FAMILY HISTORY Family History  Problem Relation Age of Onset  . Osteoporosis Mother   . Osteoporosis Sister   . Lung cancer Maternal Aunt        smoker  . Heart Problems Maternal Grandmother   . COPD Maternal Grandfather   . Neurodegenerative disease Maternal Aunt        corical-ganglion degeneration  . Ovarian cancer Maternal Aunt        dx in her 80s  . Leukemia Cousin        29s-40s; maternal cousin  [name] has essentially no information on her father's side of the family aside from the fact that he died in his 29s from heart disease. He had psoriasis. The patient's mother passed away 03/06/2017 age 52, due to breast cancer. The patient had 2 brothers who died from congenital heart  disease shortly after birth. The patient has one sister. The patient's mother had one sister, the patient's aunt, diagnosed with ovarian cancer in her 25s.  GYNECOLOGIC HISTORY:  Patient's last menstrual period was 03/22/2000 (approximate). Menarche age 54, first live birth age 87. The patient is GX P2. She went through menopause approximately 1990 but was taking hormone replacement until 01/04/2016. Since coming off replacement she has had insomnia but no hot flashes or vaginal dryness pleural bones so 4. 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 She is divorced and lives alone, with no pets. Her daughter [name] lives in [place].  She is disabled secondary to schizoaffective disorder. Daughter [name] lives in [place] and she is a Estate agent but mostly a homemaker. The patient has 3 grandchildren, the older 21, the other 2 being under 91 years old. The patient attends [employer]. Her mother passed away in 03-08-2017 due to breast cancer.    ADVANCED DIRECTIVES: her daughter [name] is her healthcare power of attorney. She can be reached at [place]: Social History   Tobacco Use  . Smoking status: Former Smoker    Packs/day: 1.00    Years: 12.00    Pack years: 12.00    Types: Cigarettes    Last attempt to quit: 07/23/1981    Years since quitting: 36.5  . Smokeless tobacco: Never Used  Substance Use Topics  . Alcohol use: Yes    Alcohol/week: 0.0 oz    Comment: social occasion only- special   . Drug use: No     Colonoscopy: ; [facility]  ZOX:WRUEAVW 2016/[facility]  Bone density:rJanuary 2018/normal   Lipid panel:  Allergies  Allergen Reactions  . Bee Venom   . Norco [Hydrocodone-Acetaminophen] Other (See Comments)    Pt states Norco made her extremely hyper after her surgery and she was not able to sleep.    Current Outpatient Medications  Medication Sig Dispense Refill  . anastrozole (ARIMIDEX) 1 MG tablet Take 1 mg by mouth every evening.   3  . FLURANDRENOLIDE EX Apply 1 application topically 2 (two) times daily as needed (for psoriasis). Cordran Tape    . folic acid (FOLVITE) 1 MG tablet Take 2 mg by mouth every evening.    Vladimir Faster Glycol-Propyl Glycol (LUBRICANT EYE DROPS) 0.4-0.3 % SOLN Place 1-2 drops into both eyes 3 (three) times daily as needed (dry eyes/irritated eyes.).    Marland Kitchen traMADol (ULTRAM) 50 MG tablet Take 1 tablet (50 mg total) by mouth every 6 (six) hours as needed (mild pain). 20 tablet 0   Current Facility-Administered Medications  Medication Dose Route Frequency Provider Last Rate Last Dose  . triamcinolone  acetonide (KENALOG) 10 MG/ML injection 10 mg  10 mg Other Once Wallene Huh, DPM        OBJECTIVE: middle-aged white woman in no acute distress  Vitals:   01/29/18 1058  BP: (!) 154/86  Sullivan: 69  Resp: 18  Temp: 98.7 F (37.1 C)  SpO2: 100%     Body mass index is 30.7 kg/m.    ECOG FS:1 - Symptomatic but completely ambulatory  Sclerae unicteric, pupils round and equal Oropharynx clear and moist No cervical or supraclavicular adenopathy Lungs no rales or rhonchi Heart regular rate and rhythm Abd soft, nontender, positive bowel sounds MSK no focal spinal tenderness, no upper  extremity lymphedema Neuro: nonfocal, well oriented, appropriate affect Breasts: Status post bilateral mastectomies.  The incisions are healing nicely, with no significant erythema or dehiscence.  On the right side there is some subjacent fluctuance.  This is not tender to palpation.  Both axilla are benign  LAB RESULTS:  CMP     Component Value Date/Time   NA 139 01/04/2018 1212   NA 141 08/05/2017 1335   K 4.1 01/04/2018 1212   K 4.5 08/05/2017 1335   CL 107 01/04/2018 1212   CO2 24 01/04/2018 1212   CO2 29 08/05/2017 1335   GLUCOSE 92 01/04/2018 1212   GLUCOSE 77 08/05/2017 1335   BUN 20 01/04/2018 1212   BUN 13.4 08/05/2017 1335   CREATININE 0.79 01/04/2018 1212   CREATININE 0.8 08/05/2017 1335   CALCIUM 9.4 01/04/2018 1212   CALCIUM 10.0 08/05/2017 1335   PROT 6.8 08/05/2017 1335   ALBUMIN 3.6 08/05/2017 1335   AST 19 08/05/2017 1335   ALT 12 08/05/2017 1335   ALKPHOS 169 (H) 08/05/2017 1335   BILITOT 0.63 08/05/2017 1335   GFRNONAA >60 01/04/2018 1212   GFRAA >60 01/04/2018 1212    INo results found for: SPEP, UPEP  Lab Results  Component Value Date   WBC 13.5 (H) 01/05/2018   NEUTROABS 3.2 08/05/2017   HGB 13.1 01/05/2018   HCT 38.6 01/05/2018   MCV 89.8 01/05/2018   PLT 280 01/05/2018      Chemistry      Component Value Date/Time   NA 139 01/04/2018 1212   NA 141  08/05/2017 1335   K 4.1 01/04/2018 1212   K 4.5 08/05/2017 1335   CL 107 01/04/2018 1212   CO2 24 01/04/2018 1212   CO2 29 08/05/2017 1335   BUN 20 01/04/2018 1212   BUN 13.4 08/05/2017 1335   CREATININE 0.79 01/04/2018 1212   CREATININE 0.8 08/05/2017 1335      Component Value Date/Time   CALCIUM 9.4 01/04/2018 1212   CALCIUM 10.0 08/05/2017 1335   ALKPHOS 169 (H) 08/05/2017 1335   AST 19 08/05/2017 1335   ALT 12 08/05/2017 1335   BILITOT 0.63 08/05/2017 1335       No results found for: LABCA2  No components found for: LABCA125  No results for input(s): INR in the last 168 hours.  Urinalysis    Component Value Date/Time   BILIRUBINUR neg 12/11/2015 0925   PROTEINUR neg 12/11/2015 0925   UROBILINOGEN negative 12/11/2015 0925   NITRITE neg 12/11/2015 0925   LEUKOCYTESUR Negative 12/11/2015 0925    STUDIES: Nm Sentinel Node Inj-no Rpt (breast)  Result Date: 01/04/2018 Sulfur colloid was injected by the nuclear medicine technologist for melanoma sentinel node.   Dg Chest Port 1 View  Result Date: 01/04/2018 CLINICAL DATA:  Post bilateral mastectomy. EXAM: PORTABLE CHEST 1 VIEW COMPARISON:  Chest CT - 02/15/2008 FINDINGS: Normal cardiac silhouette and mediastinal contours given patient rotation. Right jugular approach port a catheter tip terminates over the mid SVC no focal airspace opacities. No pleural effusion or pneumothorax. No evidence of edema. No acute osseus abnormalities. Subcutaneous drainage catheters overlie the chest bilaterally. Note is made of a minimal amount of subcutaneous emphysema about the bilateral drainage catheters. No radiopaque foreign body. IMPRESSION: 1.  No acute cardiopulmonary disease. 2. Right jugular approach port a catheter tip terminates over the mid SVC. No pneumothorax. 3. Bilateral chest wall subcutaneous drains. Electronically Signed   By: Sandi Mariscal M.D.   On: 01/04/2018 20:12  Dg Fluoro Guide Cv Line-no Report  Result Date:  01/04/2018 Fluoroscopy was utilized by the requesting physician.  No radiographic interpretation.    ASSESSMENT: 67 y.o. [place] woman status post right breast upper outer quadrant biopsy 01/02/2016 for a clinical T2 N0, stage IIA invasive ductal carcinoma, grade 1, estrogen and progesterone receptor positive, HER-2 not amplified, with an MIB-1 of 10%.  (1). Right lumpectomy and sentinel lymph node sampling 01/22/2016 showed a pT1c pN0, stage IA invasive ductal carcinoma, grade 1, with close but negative margins. Repeat HER-2 testing was again not amplified   (3) Oncotype score of 18 predicts a risk of recurrence outside the breast within the next 10 years of 11% if the patient's only systemic therapy is tamoxifen for 5 years. It also predicts no significant benefit from chemotherapy.     (4) adjuvant radiation3/14/17-4/11/17 Site/dose:   Right breast - 42.72 gy at 2.67 Gy per fraction x 16 fractions followed by an electron boost to the right breast with 10 Gy at 2 Gy per fraction x 5 fractions.    (5)  Anastrozole started 04/21/2016, discontinued after approximately 1 month with significant side effects  (a)  Bone density at [facility] September 2013 was normal  (b) bone density January 2018 was normal  (c) anastrozole restarted February 2018, held again at the start of chemotherapy February 2019  (6) genetics testing  02/24/2016 through the Breast/Ovarian cancer gene panel offered by [facility] found no deleterious mutations in ATM, BARD1, BRCA1, BRCA2, BRIP1, CDH1, CHEK2, EPCAM, FANCC, MLH1, MSH2, MSH6, NBN, PALB2, PMS2, PTEN, RAD51C, RAD51D, TP53, and XRCC2.   (7) left breast biopsy 12/17/2017 showed a cT2 cN0, stage IIA- IIB invasive ductal carcinoma, grade 2, estrogen receptor weakly positive, progesterone receptor negative, with HER-2 equivocal by FISH, negative by immunohistochemistry  (8) status post bilateral mastectomies and left sentinel lymph node sampling 01/04/2018  showing  (a) on the right, no evidence of disease  (b) on the left, a pT2 pN0, stage IIB invasive ductal carcinoma, grade 3, triple negative  (9) adjuvant chemotherapy will consist of cyclophosphamide and doxorubicin in dose dense fashion x4 starting 02/16/2018 followed by weekly paclitaxel x12  (a) consider NRG BR003 (randomized to +/- [place] with taxol treatments)  PLAN: Samantha Sullivan understands the left-sided tumor is not related to her prior right-sided breast cancer.  This 1 is triple negative meaning it is not dependent on estrogen, progesterone, or HER-2.  For that reason antiestrogen and anti-HER-2 systemic therapy would be futile and her only choice for systemic treatment is chemotherapy.  We discussed several options including cyclophosphamide plus docetaxel, cyclophosphamide plus methotrexate and fluorouracil, and cyclophosphamide plus Adriamycin followed by paclitaxel.  After much discussion of the timing mechanics and possible side effects of these regimens she opted for the latter.  Accordingly she will start cyclophosphamide and doxorubicin on 02/16/2018.  She already has a port in place, and she had an echocardiogram on 01/25/2018 showing an excellent ejection fraction.  She may qualify for our adjuvant carbo study.  I did not think of this before she left so I did not discuss it with her yet.  I am referring her to our research nurses today for further evaluation  She will return to see me on 02/12/2018 to discuss how to take her supportive medicines.  I was very pleased to learn that she feels very supported and encouraged by her family  I let her know that many of the symptoms that she is having postoperatively will improve with  time.  The bit of fluid on the right chest wall area may or may not persist.  If it becomes symptomatic she will see her surgeon.  Otherwise she knows to call for any issues that may develop before her next visit here.  Rhianne Soman, Virgie Dad, MD  01/29/18  11:46 AM Medical Oncology and Hematology [facility] [address] [place], [place] [address] Tel. [phone]    Fax. [phone]    This document serves as a record of services personally performed by Lurline Del, MD. It was created on his behalf by Steva Colder, a trained medical scribe. The creation of this record is based on the scribe's personal observations and the provider's statements to them.   I have reviewed the above documentation for accuracy and completeness, and I agree with the above.

## 2018-01-28 ENCOUNTER — Ambulatory Visit: Payer: PPO | Attending: General Surgery | Admitting: Physical Therapy

## 2018-01-28 ENCOUNTER — Other Ambulatory Visit: Payer: Self-pay

## 2018-01-28 DIAGNOSIS — R222 Localized swelling, mass and lump, trunk: Secondary | ICD-10-CM | POA: Insufficient documentation

## 2018-01-28 DIAGNOSIS — M79602 Pain in left arm: Secondary | ICD-10-CM | POA: Diagnosis not present

## 2018-01-28 DIAGNOSIS — Z483 Aftercare following surgery for neoplasm: Secondary | ICD-10-CM | POA: Insufficient documentation

## 2018-01-28 DIAGNOSIS — M25611 Stiffness of right shoulder, not elsewhere classified: Secondary | ICD-10-CM

## 2018-01-28 DIAGNOSIS — M6281 Muscle weakness (generalized): Secondary | ICD-10-CM | POA: Diagnosis not present

## 2018-01-28 DIAGNOSIS — M25612 Stiffness of left shoulder, not elsewhere classified: Secondary | ICD-10-CM

## 2018-01-28 NOTE — Patient Instructions (Signed)
SHOULDER: Flexion - Supine (Cane)        Cancer Rehab [phone]    Hold cane in both hands. Raise arms up overhead. Do not allow back to arch. Hold _5__ seconds. Do __5-10__ times; __1-2__ times a day.   SELF ASSISTED WITH OBJECT: Shoulder Abduction / Adduction - Supine    Hold cane with both hands. Move both arms from side to side, keep elbows straight.  Hold when stretch felt for __5__ seconds. Repeat __5-10__ times; __1-2__ times a day. Once this becomes easier progress to third picture bringing affected arm towards ear by staying out to side. Same hold for _5_seconds. Repeat  _5-10_ times, _1-2_ times/day.   

## 2018-01-29 ENCOUNTER — Telehealth: Payer: Self-pay | Admitting: Oncology

## 2018-01-29 ENCOUNTER — Inpatient Hospital Stay (HOSPITAL_BASED_OUTPATIENT_CLINIC_OR_DEPARTMENT_OTHER): Payer: PPO | Admitting: Oncology

## 2018-01-29 ENCOUNTER — Inpatient Hospital Stay: Payer: PPO | Attending: Oncology

## 2018-01-29 VITALS — BP 154/86 | HR 69 | Temp 98.7°F | Resp 18 | Ht 65.0 in | Wt 184.5 lb

## 2018-01-29 DIAGNOSIS — Z170 Estrogen receptor positive status [ER+]: Secondary | ICD-10-CM

## 2018-01-29 DIAGNOSIS — Z9011 Acquired absence of right breast and nipple: Secondary | ICD-10-CM

## 2018-01-29 DIAGNOSIS — C50411 Malignant neoplasm of upper-outer quadrant of right female breast: Secondary | ICD-10-CM | POA: Diagnosis not present

## 2018-01-29 DIAGNOSIS — C50912 Malignant neoplasm of unspecified site of left female breast: Secondary | ICD-10-CM | POA: Insufficient documentation

## 2018-01-29 DIAGNOSIS — Z9012 Acquired absence of left breast and nipple: Secondary | ICD-10-CM | POA: Diagnosis not present

## 2018-01-29 DIAGNOSIS — C50812 Malignant neoplasm of overlapping sites of left female breast: Secondary | ICD-10-CM

## 2018-01-29 NOTE — Therapy (Signed)
[place], [place], [address] Phone: [phone]   Fax:  [phone]  Physical Therapy Evaluation  Patient Details  Name: Samantha Sullivan MRN: [id number] Date of Birth: Jan 20, 1951 Referring Provider: Dr. Excell Seltzer    Encounter Date: 01/28/2018  PT End of Session - 01/29/18 0734    Visit Number  1    Number of Visits  9    Date for PT Re-Evaluation  02/26/18    PT Start Time  6962    PT Stop Time  1100    PT Time Calculation (min)  45 min    Activity Tolerance  Patient tolerated treatment well    Behavior During Therapy  [facility] for tasks assessed/performed       Past Medical History:  Diagnosis Date  . Abnormal CT of the chest 12/06   numeroous lung nodules - most have resolved.  . Arthritis    psoriatic arthritis- toes & feet, but resolved at this point- no need meds at this point    . Cancer [facility])    right breast cancer  . Complication of anesthesia    /w colonoscopy- woke up before procedure was complete  . Family history of adverse reaction to anesthesia    pt's mother reported difficulty being given " enough " medicine to outcome a successful anesth. experience  . Family history of leukemia   . Family history of ovarian cancer   . History of meningioma 2007   in 2013 stable on MRI and recheck in 5 years, no surgery necessry   . HSV infection 1970's- 1980's  . Psoriatic arthritis [facility])     Past Surgical History:  Procedure Laterality Date  . BILATERAL TOTAL MASTECTOMY WITH AXILLARY LYMPH NODE DISSECTION Bilateral 01/04/2018   Procedure: BILATERAL TOTAL MASTECTOMY PROPHYLATIC RIGHT  WITH LEFT SENTINEAL  LYMPH NODE;  Surgeon: Excell Seltzer, MD;  Location: [place];  Service: General;  Laterality: Bilateral;  . BREAST LUMPECTOMY WITH RADIOACTIVE SEED AND SENTINEL LYMPH NODE BIOPSY Right 01/22/2016   Procedure: RIGHT BREAST LUMPECTOMY WITH RADIOACTIVE SEED AND SENTINEL LYMPH NODE BIOPSY;   Surgeon: Excell Seltzer, MD;  Location: [place];  Service: General;  Laterality: Right;  . BREAST SURGERY Left 1971, 2016   lumpectomy benign, 2016- Br. Ca- R lumpectomy & radiation   . COLONOSCOPY  8/06   normal and recheck in 10 years  . HAMMER TOE SURGERY Left 2016  . MASTECTOMY Right 01/04/2018   PROPHYLATIC   . MASTECTOMY COMPLETE / SIMPLE W/ SENTINEL NODE BIOPSY Left 01/04/2018  . PORTACATH PLACEMENT Right 01/04/2018   Procedure: INSERTION PORT-A-CATH;  Surgeon: Excell Seltzer, MD;  Location: [place];  Service: General;  Laterality: Right;  . THYROIDECTOMY, PARTIAL Left 1983    There were no vitals filed for this visit.   Subjective Assessment - 01/28/18 1028    Subjective  tighness and pulling in left arm with burning sensation . she feels like she has a tight band around her chest and swelling around the incision     Pertinent History  right breast cancer in 2017 with lumpectomy , sentinel node biopsy and radiation ( no chemo) left breast cancer in Dec. 26 on regular mammogram and had bilataral mastectomy with left node biospy (3 nodes ) on Jan 04 2018 drains removed 2 1/2 weeks later . has a port on her right side and plans to have 5 months of chemo but no radiation     Patient Stated Goals  get back to the normal "me"     Currently in Pain?  Yes    Pain Score  -- discomfot but not pain          [facility] PT Assessment - 01/28/18 0001      Assessment   Medical Diagnosis  left breast cancer     Referring Provider  Dr. Excell Seltzer     Onset Date/Surgical Date  01/04/18    Hand Dominance  Right      Precautions   Precautions  Other (comment)    Precaution Comments  no lifting over 10 pounds       Restrictions   Weight Bearing Restrictions  No      Balance Screen   Has the patient fallen in the past 6 months  No    Has the patient had a decrease in activity level because of a fear of falling?   No    Is the patient reluctant to leave their home because  of a fear of falling?   No      Home Environment   Living Environment  Private residence    Living Arrangements  Alone    Available Help at Discharge  Family;Friend(s);Available PRN/intermittently      Prior Function   Level of Independence  Independent    Vocation  Retired    Leisure  walks every day at least 30 minutes sometimes more       Cognition   Overall Cognitive Status  Within Functional Limits for tasks assessed      Observation/Other Assessments   Skin Integrity  healing incsions on bilateral chests with surgical glue intact no open areas  visible fullness just below right chest incision and lateral chest that she says the doctor is watching in case it needs to be drained     Quick DASH   34.09      Sensation   Light Touch  Not tested      Coordination   Gross Motor Movements are Fluid and Coordinated  No bilateral shoulder elevation is limited by pain       Posture/Postural Control   Posture/Postural Control  Postural limitations    Postural Limitations  Rounded Shoulders;Forward head    Posture Comments  asymmetrical scapula with some protrusion of right inferior angle       ROM / Strength   AROM / PROM / Strength  AROM;Strength      AROM   Overall AROM Comments  limited by pain and restriction from the cording     Right Shoulder Flexion  144 Degrees    Right Shoulder ABduction  100 Degrees guitar string cord in right axilla and fullness anteriorly     Right Shoulder External Rotation  70 Degrees    Left Shoulder Flexion  115 Degrees painful down the arm     Left Shoulder ABduction  94 Degrees limited by pain     Left Shoulder External Rotation  70 Degrees      Strength   Overall Strength Comments  limited by pain and cording     Right Shoulder Flexion  3-/5    Right Shoulder ABduction  3-/5    Left Shoulder Flexion  3-/5    Left Shoulder ABduction  3-/5      Palpation   Palpation comment  tight guitar string cording in right axilla, more tender and more  quantitiy of  cording in left axilla that goes down into forearm,  Katina Dung - 01/28/18 0001    Open a tight or new jar  Mild difficulty    Do heavy household chores (wash walls, wash floors)  Mild difficulty    Carry a shopping bag or briefcase  Mild difficulty    Wash your back  Moderate difficulty    Use a knife to cut food  Mild difficulty    Recreational activities in which you take some force or impact through your arm, shoulder, or hand (golf, hammering, tennis)  Moderate difficulty    During the past week, to what extent has your arm, shoulder or hand problem interfered with your normal social activities with family, friends, neighbors, or groups?  Modererately    During the past week, to what extent has your arm, shoulder or hand problem limited your work or other regular daily activities  Modererately    Arm, shoulder, or hand pain.  Mild    Tingling (pins and needles) in your arm, shoulder, or hand  None    Difficulty Sleeping  Moderate difficulty    DASH Score  34.09 %           Objective measurements completed on examination: See above findings.      [place] Adult PT Treatment/Exercise - 01/29/18 0001      Posture/Postural Control   Posture/Postural Control  Postural limitations    Postural Limitations  Rounded Shoulders;Forward head    Posture Comments  asymmetrical scapula with some protrusion of right inferior angle       Self-Care   Self-Care  Other Self-Care Comments    Other Self-Care Comments   encouraged pt to try to wear pink bandeau around chest to see if it helps with swelling at right incision  provided medium tg soft for comfort to left UE cording       Shoulder Exercises: Supine   Flexion  AAROM;Right;Left;5 reps with dowel     ABduction  AAROM;Right;Left;5 reps    Theraband Level (Shoulder ABduction)  -- with dowel              PT Education - 01/29/18 0733    Education provided  Yes    Education Details  supine dowel rod  flexion and abduciton, try compression to help with chest swelling and cording in left arm     Methods  Explanation    Comprehension  Verbalized understanding             [facility] Goals - 01/29/18 0623      CC Long Term Goal  #1   Title  Pt will report a decrease in pain in left arm by 50%    Time  4    Period  Weeks    Status  New      CC Long Term Goal  #2   Title  Pt will have increased abduction of both shoulder to 140 degrees so that she can perform self care and household chores easier at home     Time  4    Period  Weeks    Status  New      CC Long Term Goal  #3   Title  Pt will verbalize and understading of lymphedema risk reduction and be able to manage her post op swelling at home     Time  4      CC Long Term Goal  #4   Title  Pt will be independent in a home exercise program  Time  4    Period  Weeks    Status  New          Plan - 01/29/18 1610    Clinical Impression Statement  Pt presents a few weeks after bilateral mastectomy with pain, swelling, decreased shoulder range of motion and strength  and cording in both axilla but worse on the left down into left arm.      History and Personal Factors relevant to plan of care:  lives alone,  port on right side     Clinical Presentation  Evolving    Clinical Presentation due to:  will start chemo    Clinical Decision Making  Moderate    Rehab Potential  Good    Clinical Impairments Affecting Rehab Potential  lymph nodes removed from both axillae    PT Frequency  2x / week    PT Duration  4 weeks    PT Treatment/Interventions  ADLs/Self Care Home Management;DME Instruction;Scar mobilization;Passive range of motion;Neuromuscular re-education;Therapeutic activities;Therapeutic exercise;Orthotic Fit/Training;Patient/family education;Compression bandaging;Manual lymph drainage;Manual techniques;Taping    PT Next Visit Plan  check to see if tg soft or bandeau is helping, Manual lymph draiange to left  arm and right chest and lateral trunk, progress A/AA/PRPM of both shoulders with strengthening, educate about ABC class     PT Home Exercise Plan  supine dowel flexion and abduction     Consulted and Agree with Plan of Care  Patient       Patient will benefit from skilled therapeutic intervention in order to improve the following deficits and impairments:  Decreased knowledge of use of DME, Decreased skin integrity, Increased fascial restricitons, Pain, Postural dysfunction, Decreased scar mobility, Decreased range of motion, Decreased strength, Impaired UE functional use, Increased edema, Decreased knowledge of precautions  Visit Diagnosis: Aftercare following surgery for neoplasm  Stiffness of right shoulder, not elsewhere classified  Stiffness of left shoulder, not elsewhere classified  Pain in left arm  Muscle weakness (generalized)  Localized swelling, mass and lump, trunk     Problem List Patient Active Problem List   Diagnosis Date Noted  . Stage I breast cancer, left ([place]) 01/04/2018  . Malignant neoplasm of overlapping sites of left breast ([place]) 12/26/2017  . Genetic testing 02/26/2016  . Family history of ovarian cancer   . Family history of leukemia   . Psoriatic arthritis ([place]) 01/15/2016  . Meningioma ([place]) 01/15/2016  . Malignant neoplasm of upper-outer quadrant of right breast in female, estrogen receptor positive ([place]) 01/04/2016   Donato Heinz. Owens Shark PT  Norwood Levo 01/29/2018, 7:46 AM  [place], [place], [address] Phone: [phone]   Fax:  [phone]  Name: Samantha Sullivan MRN: [id number] Date of Birth: 03-Dec-1951

## 2018-01-29 NOTE — Progress Notes (Addendum)
START ON PATHWAY REGIMEN - Breast  Clinical Trial: Trial: NRG [id number]  Patient Characteristics: Postoperative without Neoadjuvant Therapy (Pathologic Staging), Invasive Disease, Adjuvant Therapy, HER2 Negative/Unknown/Equivocal, ER Negative/Unknown, Node Negative, pT1a-c, pN0/N51m or pT2 or Higher, pN0 Therapeutic Status: Postoperative without Neoadjuvant Therapy (Pathologic Staging) AJCC Grade: G3 AJCC N Category: pN0 AJCC M Category: cM0 ER Status: Negative (-) AJCC 8 Stage Grouping: IIA HER2 Status: Negative (-) Oncotype Dx Recurrence Score: Not Appropriate AJCC T Category: pT2 PR Status: Negative (-) Intent of Therapy: Curative Intent, Discussed with Patient

## 2018-01-29 NOTE — Telephone Encounter (Signed)
Gave patient AVs and calendar of upcoming February through April appointments.  °

## 2018-02-01 ENCOUNTER — Other Ambulatory Visit: Payer: Self-pay | Admitting: Oncology

## 2018-02-02 ENCOUNTER — Ambulatory Visit: Payer: PPO | Admitting: Physical Therapy

## 2018-02-02 ENCOUNTER — Telehealth: Payer: Self-pay | Admitting: *Deleted

## 2018-02-02 ENCOUNTER — Encounter: Payer: Self-pay | Admitting: Physical Therapy

## 2018-02-02 DIAGNOSIS — M79602 Pain in left arm: Secondary | ICD-10-CM

## 2018-02-02 DIAGNOSIS — Z483 Aftercare following surgery for neoplasm: Secondary | ICD-10-CM | POA: Diagnosis not present

## 2018-02-02 DIAGNOSIS — M6281 Muscle weakness (generalized): Secondary | ICD-10-CM

## 2018-02-02 DIAGNOSIS — M25611 Stiffness of right shoulder, not elsewhere classified: Secondary | ICD-10-CM

## 2018-02-02 DIAGNOSIS — M25612 Stiffness of left shoulder, not elsewhere classified: Secondary | ICD-10-CM

## 2018-02-02 NOTE — Telephone Encounter (Signed)
Received call from patient that she is going to [facility] for a 2nd opinion on 2/22.  She wants to go ahead as planned with her treatment, she states she is just doing this to appease her family and friends.

## 2018-02-02 NOTE — Patient Instructions (Signed)
Kinesiotape should stay on your body for a few days.  It's OK to shower with it on.  Just pat it dry afterwards.    While the tape is on, keep an eye on the skin around it.  If you feel any itching, see any redness or feel in any way that the tape is irritating your skin, it is time to gently remove it.   To loosen the tape from your skin, use something oily like olive oil or baby oil on a cotton ball.  Gently roll the edge of the tape away from the skin.  Then, hold the edge of the tape away from the skin and gently press the skin away from the tape.  Do not pull the tape off the skin or you may cause skin irritation.  Then, inspect your skin.  Gently cleanse with soap and water  

## 2018-02-02 NOTE — Therapy (Signed)
[place], [place], [address] Phone: [phone]   Fax:  [phone]  Physical Therapy Treatment  Patient Details  Name: Samantha Sullivan MRN: [id number] Date of Birth: 1951/06/10 Referring Provider: Dr. Excell Seltzer    Encounter Date: 02/02/2018    Past Medical History:  Diagnosis Date  . Abnormal CT of the chest 12/06   numeroous lung nodules - most have resolved.  . Arthritis    psoriatic arthritis- toes & feet, but resolved at this point- no need meds at this point    . Cancer [facility])    right breast cancer  . Complication of anesthesia    /w colonoscopy- woke up before procedure was complete  . Family history of adverse reaction to anesthesia    pt's mother reported difficulty being given " enough " medicine to outcome a successful anesth. experience  . Family history of leukemia   . Family history of ovarian cancer   . History of meningioma 2007   in 2013 stable on MRI and recheck in 5 years, no surgery necessry   . HSV infection 1970's- 1980's  . Psoriatic arthritis [facility])     Past Surgical History:  Procedure Laterality Date  . BILATERAL TOTAL MASTECTOMY WITH AXILLARY LYMPH NODE DISSECTION Bilateral 01/04/2018   Procedure: BILATERAL TOTAL MASTECTOMY PROPHYLATIC RIGHT  WITH LEFT SENTINEAL  LYMPH NODE;  Surgeon: Excell Seltzer, MD;  Location: [place];  Service: General;  Laterality: Bilateral;  . BREAST LUMPECTOMY WITH RADIOACTIVE SEED AND SENTINEL LYMPH NODE BIOPSY Right 01/22/2016   Procedure: RIGHT BREAST LUMPECTOMY WITH RADIOACTIVE SEED AND SENTINEL LYMPH NODE BIOPSY;  Surgeon: Excell Seltzer, MD;  Location: [place];  Service: General;  Laterality: Right;  . BREAST SURGERY Left 1971, 2016   lumpectomy benign, 2016- Br. Ca- R lumpectomy & radiation   . COLONOSCOPY  8/06   normal and recheck in 10 years  . HAMMER TOE SURGERY Left 2016  . MASTECTOMY Right 01/04/2018   PROPHYLATIC   . MASTECTOMY COMPLETE / SIMPLE W/ SENTINEL NODE BIOPSY Left 01/04/2018  . PORTACATH PLACEMENT Right 01/04/2018   Procedure: INSERTION PORT-A-CATH;  Surgeon: Excell Seltzer, MD;  Location: [place];  Service: General;  Laterality: Right;  . THYROIDECTOMY, PARTIAL Left 1983    There were no vitals filed for this visit.  Subjective Assessment - 02/02/18 1526    Subjective  Pt says that exercise has helped her arm,  It not significantly better but it is significantly different .  she is reading further and is able raise her roman shade higher   (Pended)     Pertinent History  right breast cancer in 2017 with lumpectomy , sentinel node biopsy and radiation ( no chemo) left breast cancer in Dec. 26 on regular mammogram and had bilataral mastectomy with left node biospy (3 nodes ) on Jan 04 2018 drains removed 2 1/2 weeks later . has a port on her right side and plans to have 5 months of chemo but no radiation   (Pended)     Patient Stated Goals  get back to the normal "me"   (Pended)     Currently in Pain?  Yes  (Pended)     Pain Score  5   (Pended)  discomfort                       [facility] Adult PT Treatment/Exercise - 02/02/18 0001      Self-Care   Other  Self-Care Comments    small tg soft to left wrist to elbow and medium tg soft to arm with deep fold at upper arm       Manual Therapy   Manual Therapy  Edema management;Manual Lymphatic Drainage (MLD);Taping    Manual therapy comments  right swelling below incision flattens our with pt in supine     Edema Management  pt with visible soft edema at right chest below incision and at anterior right axilla and visible skin puckering from cording down left arm     Manual Lymphatic Drainage (MLD)  with pt in supine short neck, superficial and deep abdominals. right inguinla nodes with right anteior and lateral chest below axlla and distal to port with care to avoid port, with extra time spent on lateral chest. then to  left  inguinal nodes, left chest, upper arm and forearm down into wrist     Kinesiotex  Edema      Kinesiotix   Edema  skin kote to right lateral chest and 4 pronged tape from lateral chest toward groin with 2 prongs up lateral chest to fullness at anterior axilla and 2 prongs cut shorter to fullness under chest incision. Pt given written handout for removal at any irritation              PT Education - 02/02/18 1645    Education provided  Yes    Education Details  removal of kinesiotape    Person(s) Educated  Patient    Methods  Explanation;Handout    Comprehension  Verbalized understanding             [facility] Goals - 01/29/18 [phone]      CC Long Term Goal  #1   Title  Pt will report a decrease in pain in left arm by 50%    Time  4    Period  Weeks    Status  New      CC Long Term Goal  #2   Title  Pt will have increased abduction of both shoulder to 140 degrees so that she can perform self care and household chores easier at home     Time  4    Period  Weeks    Status  New      CC Long Term Goal  #3   Title  Pt will verbalize and understading of lymphedema risk reduction and be able to manage her post op swelling at home     Time  4      CC Long Term Goal  #4   Title  Pt will be independent in a home exercise program     Time  4    Period  Weeks    Status  New           Patient will benefit from skilled therapeutic intervention in order to improve the following deficits and impairments:     Visit Diagnosis: Aftercare following surgery for neoplasm  Stiffness of right shoulder, not elsewhere classified  Stiffness of left shoulder, not elsewhere classified  Pain in left arm  Muscle weakness (generalized)     Problem List Patient Active Problem List   Diagnosis Date Noted  . Stage I breast cancer, left ([place]) 01/04/2018  . Malignant neoplasm of overlapping sites of left breast ([place]) 12/26/2017  . Genetic testing 02/26/2016  . Family  history of ovarian cancer   . Family history of leukemia   . Psoriatic arthritis ([place])  01/15/2016  . Meningioma ([place]) 01/15/2016  . Malignant neoplasm of upper-outer quadrant of right breast in female, estrogen receptor positive ([place]) 01/04/2016   Donato Heinz. Owens Shark PT  Norwood Levo 02/02/2018, 4:46 PM  [place], [place], [address] Phone: [phone]   Fax:  [phone]  Name: Raynah Gomes MRN: [id number] Date of Birth: [birth year]

## 2018-02-04 ENCOUNTER — Encounter: Payer: Self-pay | Admitting: Oncology

## 2018-02-04 ENCOUNTER — Ambulatory Visit: Payer: PPO | Admitting: Physical Therapy

## 2018-02-04 ENCOUNTER — Telehealth: Payer: Self-pay | Admitting: Oncology

## 2018-02-04 ENCOUNTER — Encounter: Payer: Self-pay | Admitting: Physical Therapy

## 2018-02-04 ENCOUNTER — Encounter: Payer: Self-pay | Admitting: *Deleted

## 2018-02-04 ENCOUNTER — Ambulatory Visit: Payer: PPO | Admitting: Oncology

## 2018-02-04 DIAGNOSIS — Z483 Aftercare following surgery for neoplasm: Secondary | ICD-10-CM | POA: Diagnosis not present

## 2018-02-04 DIAGNOSIS — M25612 Stiffness of left shoulder, not elsewhere classified: Secondary | ICD-10-CM

## 2018-02-04 DIAGNOSIS — M79602 Pain in left arm: Secondary | ICD-10-CM

## 2018-02-04 DIAGNOSIS — R222 Localized swelling, mass and lump, trunk: Secondary | ICD-10-CM

## 2018-02-04 DIAGNOSIS — M6281 Muscle weakness (generalized): Secondary | ICD-10-CM

## 2018-02-04 DIAGNOSIS — M25611 Stiffness of right shoulder, not elsewhere classified: Secondary | ICD-10-CM

## 2018-02-04 NOTE — Telephone Encounter (Signed)
FAXED RECORDS TO [place] [phone]

## 2018-02-04 NOTE — Therapy (Signed)
[place], [place], [address] Phone: [phone]   Fax:  [phone]  Physical Therapy Treatment  Patient Details  Name: Edna Rede MRN: [id number] Date of Birth: November 21, 1951 Referring Provider: Dr. Excell Seltzer    Encounter Date: 02/04/2018  PT End of Session - 02/04/18 1706    Visit Number  3    Number of Visits  9    Date for PT Re-Evaluation  02/26/18    PT Start Time  1062    PT Stop Time  1430    PT Time Calculation (min)  45 min    Activity Tolerance  Patient tolerated treatment well    Behavior During Therapy  [facility] for tasks assessed/performed       Past Medical History:  Diagnosis Date  . Abnormal CT of the chest 12/06   numeroous lung nodules - most have resolved.  . Arthritis    psoriatic arthritis- toes & feet, but resolved at this point- no need meds at this point    . Cancer [facility])    right breast cancer  . Complication of anesthesia    /w colonoscopy- woke up before procedure was complete  . Family history of adverse reaction to anesthesia    pt's mother reported difficulty being given " enough " medicine to outcome a successful anesth. experience  . Family history of leukemia   . Family history of ovarian cancer   . History of meningioma 2007   in 2013 stable on MRI and recheck in 5 years, no surgery necessry   . HSV infection 1970's- 1980's  . Psoriatic arthritis [facility])     Past Surgical History:  Procedure Laterality Date  . BILATERAL TOTAL MASTECTOMY WITH AXILLARY LYMPH NODE DISSECTION Bilateral 01/04/2018   Procedure: BILATERAL TOTAL MASTECTOMY PROPHYLATIC RIGHT  WITH LEFT SENTINEAL  LYMPH NODE;  Surgeon: Excell Seltzer, MD;  Location: [place];  Service: General;  Laterality: Bilateral;  . BREAST LUMPECTOMY WITH RADIOACTIVE SEED AND SENTINEL LYMPH NODE BIOPSY Right 01/22/2016   Procedure: RIGHT BREAST LUMPECTOMY WITH RADIOACTIVE SEED AND SENTINEL LYMPH NODE BIOPSY;   Surgeon: Excell Seltzer, MD;  Location: [place];  Service: General;  Laterality: Right;  . BREAST SURGERY Left 1971, 2016   lumpectomy benign, 2016- Br. Ca- R lumpectomy & radiation   . COLONOSCOPY  8/06   normal and recheck in 10 years  . HAMMER TOE SURGERY Left 2016  . MASTECTOMY Right 01/04/2018   PROPHYLATIC   . MASTECTOMY COMPLETE / SIMPLE W/ SENTINEL NODE BIOPSY Left 01/04/2018  . PORTACATH PLACEMENT Right 01/04/2018   Procedure: INSERTION PORT-A-CATH;  Surgeon: Excell Seltzer, MD;  Location: [place];  Service: General;  Laterality: Right;  . THYROIDECTOMY, PARTIAL Left 1983    There were no vitals filed for this visit.  Subjective Assessment - 02/04/18 1403    Subjective  Pt still has a tight chest, but"honestly I do think its better"     Pertinent History  right breast cancer in 2017 with lumpectomy , sentinel node biopsy and radiation ( no chemo) left breast cancer in Dec. 26 on regular mammogram and had bilataral mastectomy with left node biospy (3 nodes ) on Jan 04 2018 drains removed 2 1/2 weeks later . has a port on her right side and plans to have 5 months of chemo but no radiation     Patient Stated Goals  get back to the normal "me"     Currently in Pain?  Yes    Pain Score  4     Pain Location  Chest    Pain Descriptors / Indicators  Tightness    Pain Type  Acute pain                      [facility] Adult PT Treatment/Exercise - 02/04/18 0001      Manual Therapy   Manual Therapy  Edema management;Manual Lymphatic Drainage (MLD);Passive ROM    Manual therapy comments  removed kinesiotapy     Edema Management  applies small tg soft to left arm to above elbow and another piece of medium Tg soft from lower arm to axilla with deep fold at the top     Manual Lymphatic Drainage (MLD)  with pt in supine short neck, superficial and deep abdominals. right inguinla nodes with right anteior and lateral chest below axlla and distal to port with  care to avoid port, with extra time spent on lateral chest. then to  left inguinal nodes, left chest, upper arm and forearm down into wrist     Passive ROM  to left shoulder with gentle stretching to cording                      [facility] Goals - 01/29/18 0742      CC Long Term Goal  #1   Title  Pt will report a decrease in pain in left arm by 50%    Time  4    Period  Weeks    Status  New      CC Long Term Goal  #2   Title  Pt will have increased abduction of both shoulder to 140 degrees so that she can perform self care and household chores easier at home     Time  4    Period  Weeks    Status  New      CC Long Term Goal  #3   Title  Pt will verbalize and understading of lymphedema risk reduction and be able to manage her post op swelling at home     Time  4      CC Long Term Goal  #4   Title  Pt will be independent in a home exercise program     Time  4    Period  Weeks    Status  New         Plan - 02/04/18 1707    Clinical Impression Statement  Pt did not seem to have good effect from kinesiotape so it was not reapplied today.  She is receiving benefit from manual lymph drainage and passive stretching and extra time was spent on left arm      Rehab Potential  Good    Clinical Impairments Affecting Rehab Potential  lymph nodes removed from both axillae    PT Frequency  2x / week    PT Duration  4 weeks    PT Treatment/Interventions  ADLs/Self Care Home Management;DME Instruction;Scar mobilization;Passive range of motion;Neuromuscular re-education;Therapeutic activities;Therapeutic exercise;Orthotic Fit/Training;Patient/family education;Compression bandaging;Manual lymph drainage;Manual techniques;Taping    PT Next Visit Plan  , Manual lymph draiange to left arm and right chest and lateral trunk, progress A/AA/PRPM of both shoulders with strengthening, educate about ABC class  considrer kinesiotape     PT Home Exercise Plan  supine dowel flexion and  abduction     Consulted and Agree with Plan of Care  Patient  Patient will benefit from skilled therapeutic intervention in order to improve the following deficits and impairments:  Decreased knowledge of use of DME, Decreased skin integrity, Increased fascial restricitons, Pain, Postural dysfunction, Decreased scar mobility, Decreased range of motion, Decreased strength, Impaired UE functional use, Increased edema, Decreased knowledge of precautions  Visit Diagnosis: Aftercare following surgery for neoplasm  Stiffness of right shoulder, not elsewhere classified  Stiffness of left shoulder, not elsewhere classified  Pain in left arm  Muscle weakness (generalized)  Localized swelling, mass and lump, trunk     Problem List Patient Active Problem List   Diagnosis Date Noted  . Stage I breast cancer, left ([place]) 01/04/2018  . Malignant neoplasm of overlapping sites of left breast ([place]) 12/26/2017  . Genetic testing 02/26/2016  . Family history of ovarian cancer   . Family history of leukemia   . Psoriatic arthritis ([place]) 01/15/2016  . Meningioma ([place]) 01/15/2016  . Malignant neoplasm of upper-outer quadrant of right breast in female, estrogen receptor positive ([place]) 01/04/2016   Donato Heinz. Owens Shark PT  Norwood Levo 02/04/2018, 5:10 PM  [place], [place], [address] Phone: [phone]   Fax:  [phone]  Name: Tiffany Calmes MRN: [id number] Date of Birth: 10-23-1951

## 2018-02-09 ENCOUNTER — Ambulatory Visit: Payer: PPO | Admitting: Physical Therapy

## 2018-02-09 ENCOUNTER — Encounter: Payer: Self-pay | Admitting: Physical Therapy

## 2018-02-09 DIAGNOSIS — Z483 Aftercare following surgery for neoplasm: Secondary | ICD-10-CM | POA: Diagnosis not present

## 2018-02-09 DIAGNOSIS — C50911 Malignant neoplasm of unspecified site of right female breast: Secondary | ICD-10-CM | POA: Diagnosis not present

## 2018-02-09 DIAGNOSIS — M25611 Stiffness of right shoulder, not elsewhere classified: Secondary | ICD-10-CM

## 2018-02-09 DIAGNOSIS — M79602 Pain in left arm: Secondary | ICD-10-CM

## 2018-02-09 DIAGNOSIS — M25612 Stiffness of left shoulder, not elsewhere classified: Secondary | ICD-10-CM

## 2018-02-09 DIAGNOSIS — C50912 Malignant neoplasm of unspecified site of left female breast: Secondary | ICD-10-CM | POA: Diagnosis not present

## 2018-02-09 DIAGNOSIS — R222 Localized swelling, mass and lump, trunk: Secondary | ICD-10-CM

## 2018-02-09 DIAGNOSIS — M6281 Muscle weakness (generalized): Secondary | ICD-10-CM

## 2018-02-09 NOTE — Therapy (Signed)
[place], [place], [address] Phone: [phone]   Fax:  [phone]  Physical Therapy Treatment  Patient Details  Name: Samantha Sullivan MRN: [id number] Date of Birth: 06/12/1951 Referring Provider: Dr. Excell Seltzer    Encounter Date: 02/09/2018  PT End of Session - 02/09/18 1629    Visit Number  4    Number of Visits  9    Date for PT Re-Evaluation  02/26/18    PT Start Time  7124    PT Stop Time  1610    PT Time Calculation (min)  55 min    Activity Tolerance  Patient tolerated treatment well    Behavior During Therapy  [facility] for tasks assessed/performed       Past Medical History:  Diagnosis Date  . Abnormal CT of the chest 12/06   numeroous lung nodules - most have resolved.  . Arthritis    psoriatic arthritis- toes & feet, but resolved at this point- no need meds at this point    . Cancer [facility])    right breast cancer  . Complication of anesthesia    /w colonoscopy- woke up before procedure was complete  . Family history of adverse reaction to anesthesia    pt's mother reported difficulty being given " enough " medicine to outcome a successful anesth. experience  . Family history of leukemia   . Family history of ovarian cancer   . History of meningioma 2007   in 2013 stable on MRI and recheck in 5 years, no surgery necessry   . HSV infection 1970's- 1980's  . Psoriatic arthritis [facility])     Past Surgical History:  Procedure Laterality Date  . BILATERAL TOTAL MASTECTOMY WITH AXILLARY LYMPH NODE DISSECTION Bilateral 01/04/2018   Procedure: BILATERAL TOTAL MASTECTOMY PROPHYLATIC RIGHT  WITH LEFT SENTINEAL  LYMPH NODE;  Surgeon: Excell Seltzer, MD;  Location: [place];  Service: General;  Laterality: Bilateral;  . BREAST LUMPECTOMY WITH RADIOACTIVE SEED AND SENTINEL LYMPH NODE BIOPSY Right 01/22/2016   Procedure: RIGHT BREAST LUMPECTOMY WITH RADIOACTIVE SEED AND SENTINEL LYMPH NODE BIOPSY;   Surgeon: Excell Seltzer, MD;  Location: [place];  Service: General;  Laterality: Right;  . BREAST SURGERY Left 1971, 2016   lumpectomy benign, 2016- Br. Ca- R lumpectomy & radiation   . COLONOSCOPY  8/06   normal and recheck in 10 years  . HAMMER TOE SURGERY Left 2016  . MASTECTOMY Right 01/04/2018   PROPHYLATIC   . MASTECTOMY COMPLETE / SIMPLE W/ SENTINEL NODE BIOPSY Left 01/04/2018  . PORTACATH PLACEMENT Right 01/04/2018   Procedure: INSERTION PORT-A-CATH;  Surgeon: Excell Seltzer, MD;  Location: [place];  Service: General;  Laterality: Right;  . THYROIDECTOMY, PARTIAL Left 1983    There were no vitals filed for this visit.  Subjective Assessment - 02/09/18 1623    Subjective  Pt states she is able to reach more things at home and has been working on her arm exercises.  She wears the Tg soft all the time.  she comes in with her pink binders today.  She still has the tightness around her chest  she felt 2 areas of cording at abdomen     Pertinent History  right breast cancer in 2017 with lumpectomy , sentinel node biopsy and radiation ( no chemo) left breast cancer in Dec. 26 on regular mammogram and had bilataral mastectomy with left node biospy (3 nodes ) on Jan 04 2018 drains removed 2 1/2  weeks later . has a port on her right side and plans to have 5 months of chemo but no radiation     Patient Stated Goals  get back to the normal "me"     Currently in Pain?  Yes    Pain Score  4     Pain Location  Chest    Pain Orientation  Lateral;Right    Pain Descriptors / Indicators  Tightness         [facility] PT Assessment - 02/09/18 0001      AROM   Right Shoulder Flexion  155 Degrees    Right Shoulder ABduction  160 Degrees    Left Shoulder Flexion  144 Degrees    Left Shoulder ABduction  121 Degrees                  [facility] Adult PT Treatment/Exercise - 02/09/18 0001      Self-Care   Self-Care  Other Self-Care Comments    Other Self-Care Comments    pt was told that she can get a voucher for a wig from [facility] or her doctor's office.  She was given a referral for a prophylactic sleeve for left arm to help with cording and will go there today       Shoulder Exercises: Pulleys   Flexion  2 minutes    Flexion Limitations  Pt felt a funny feeling "like a rubber band" down her arm while doing the pulleys, then she had less burning in her lower arm     ABduction  2 minutes      Manual Therapy   Edema Management  small tg soft in 2 pieces to left arm, chip packs to wear over fullness at incisions on bilateal chest     Manual Lymphatic Drainage (MLD)  with pt in supine short neck, superficial and deep abdominals. right inguinla nodes with right anteior and lateral chest below axlla and distal to port with care to avoid port, with extra time spent on lateral chest. then to  left inguinal nodes, left chest, upper arm and forearm down into wrist     Passive ROM  to rght and left shoulder with gentle stretching to cording     Kinesiotex  Edema      Kinesiotix   Edema  skin kote and kinesiotape to left side from fullness at distal axilla to abdomen                      [facility] Goals - 02/09/18 1631      CC Long Term Goal  #1   Title  Pt will report a decrease in pain in left arm by 50%    Time  4    Period  Weeks    Status  On-going      CC Long Term Goal  #2   Title  Pt will have increased abduction of both shoulder to 140 degrees so that she can perform self care and household chores easier at home     Baseline  goal met for right arm on 02/09/2018     Time  4    Period  Weeks    Status  On-going      CC Long Term Goal  #3   Title  Pt will verbalize and understading of lymphedema risk reduction and be able to manage her post op swelling at home     Time  4  Period  Weeks    Status  On-going      CC Long Term Goal  #4   Title  Pt will be independent in a home exercise program     Time  4    Period  Weeks     Status  On-going         Plan - 02/09/18 1629    Clinical Impression Statement  Pt had some release of cording in left arm with exercise today, but still has visible and palpable cording in arm and axilla with fullness at chest just below axilla. Added kinesiotape and upgraded chip packs to chest to help with some reduction of fluid in chest. Pt has improved range ofmotion, especially in right shoudler     Rehab Potential  Good    Clinical Impairments Affecting Rehab Potential  lymph nodes removed from both axillae    PT Frequency  2x / week    PT Duration  4 weeks    PT Treatment/Interventions  ADLs/Self Care Home Management;DME Instruction;Scar mobilization;Passive range of motion;Neuromuscular re-education;Therapeutic activities;Therapeutic exercise;Orthotic Fit/Training;Patient/family education;Compression bandaging;Manual lymph drainage;Manual techniques;Taping    PT Next Visit Plan  soft tissue work and , Manual lymph draiange to left arm and right chest and lateral trunk, progress A/AA/PRPM of both shoulders with strengthening, educate about ABC class  considrer kinesiotape     PT Home Exercise Plan  supine dowel flexion and abduction     Consulted and Agree with Plan of Care  Patient       Patient will benefit from skilled therapeutic intervention in order to improve the following deficits and impairments:  Decreased knowledge of use of DME, Decreased skin integrity, Increased fascial restricitons, Pain, Postural dysfunction, Decreased scar mobility, Decreased range of motion, Decreased strength, Impaired UE functional use, Increased edema, Decreased knowledge of precautions  Visit Diagnosis: Aftercare following surgery for neoplasm  Stiffness of right shoulder, not elsewhere classified  Stiffness of left shoulder, not elsewhere classified  Pain in left arm  Muscle weakness (generalized)  Localized swelling, mass and lump, trunk     Problem List Patient Active Problem  List   Diagnosis Date Noted  . Stage I breast cancer, left ([place]) 01/04/2018  . Malignant neoplasm of overlapping sites of left breast ([place]) 12/26/2017  . Genetic testing 02/26/2016  . Family history of ovarian cancer   . Family history of leukemia   . Psoriatic arthritis ([place]) 01/15/2016  . Meningioma ([place]) 01/15/2016  . Malignant neoplasm of upper-outer quadrant of right breast in female, estrogen receptor positive ([place]) 01/04/2016   Donato Heinz. Owens Shark PT  Norwood Levo 02/09/2018, 4:33 PM  [place], [place], [address] Phone: [phone]   Fax:  [phone]  Name: Kenyetta Wimbish MRN: [id number] Date of Birth: 22-Nov-1951

## 2018-02-10 ENCOUNTER — Encounter: Payer: PPO | Admitting: Physical Therapy

## 2018-02-11 ENCOUNTER — Other Ambulatory Visit: Payer: Self-pay | Admitting: Oncology

## 2018-02-11 ENCOUNTER — Ambulatory Visit: Payer: PPO | Admitting: Physical Therapy

## 2018-02-11 ENCOUNTER — Encounter: Payer: Self-pay | Admitting: Physical Therapy

## 2018-02-11 DIAGNOSIS — R222 Localized swelling, mass and lump, trunk: Secondary | ICD-10-CM

## 2018-02-11 DIAGNOSIS — Z483 Aftercare following surgery for neoplasm: Secondary | ICD-10-CM

## 2018-02-11 DIAGNOSIS — M6281 Muscle weakness (generalized): Secondary | ICD-10-CM

## 2018-02-11 DIAGNOSIS — M25611 Stiffness of right shoulder, not elsewhere classified: Secondary | ICD-10-CM

## 2018-02-11 DIAGNOSIS — M79602 Pain in left arm: Secondary | ICD-10-CM

## 2018-02-11 DIAGNOSIS — Z006 Encounter for examination for normal comparison and control in clinical research program: Secondary | ICD-10-CM

## 2018-02-11 DIAGNOSIS — M25612 Stiffness of left shoulder, not elsewhere classified: Secondary | ICD-10-CM

## 2018-02-11 NOTE — Progress Notes (Signed)
Samantha Sullivan  [phone] Fax:([phone]     ID: [name] DOB: 1951/02/02  MR#: [id number]  DJS#:[id number]  Patient Care Team: Shon Baton, MD as PCP - General (Internal Medicine) Alyssandra Hulsebus, Virgie Dad, MD as Consulting Physician (Oncology) Kem Boroughs, [place] as Nurse Practitioner (Family Medicine) Regal, Tamala Fothergill, DPM as Consulting Physician (Podiatry) Excell Seltzer, MD as Consulting Physician (General Surgery) Force, Shari Prows, DO as Referring Physician (Student) PCP: Shon Baton, MD OTHER MD:  CHIEF COMPLAINT: Contralateral breast cancer, triple negative  CURRENT TREATMENT: Adjuvant chemotherapy  INTERVAL HISTORY: Samantha Sullivan returns today for follow-up of her triple negative breast cancer. She begins to revisit and cyclophosphamide in dose dense fashion x4 starting 0226 2019 followed by paclitaxel every 7 days x12. She had her port placed at the time of her bilateral mastectomies. She notes that she is still experiencing some bilateral axillary pain and discomfort. She notes pulling and cording issues in the bilateral axillae, but more so in the left that stretches down her left side. She reports compression issues for which her physical therapist has suggested for her to wear a permanent compression sleeve and bra. She notes that her physical therapy has relieved some of the discomfort, but by a small margin.  Her anastrozole is being stopped preop  She had an echocardiogram on 01/25/2018 showing an ejection fraction in the 55-60% range.    Her port was placed at the time of her bilateral mastectomies.  It was recently accessed at [facility].  It worked well.  Incidentally she like to Dr. Derrill Center at [facility] who she says agreed with the current plan.  I am not able to access his note in care everywhere today   REVIEW OF SYSTEMS: Samantha Sullivan reports that she is seeing Dr. Excell Seltzer about draining the fluid from her mastectomies. She notes that she is following  up with Dr. Shea Evans at [facility]. For exercise, she is walking about 5 miles per day. She notes that she counts 10,000 steps per day through her FitBit. She repotrs that her daughter is processing her diagnosis well. She notes that her daughter lives a holistic lifestyle and does not believe in chemotherapy, but her daughter has been supportive of the treatment plan. Samantha Sullivan also notes that she enjoys visiting her grandchildren. She denies unusual headaches, visual changes, nausea, vomiting, or dizziness. There has been no unusual cough, phlegm production, or pleurisy. This been no change in bowel or bladder habits. She denies unexplained fatigue or unexplained weight loss, bleeding, rash, or fever. A detailed review of systems was otherwise stable.    RIGHT BREAST CANCER HISTORY:  From the original intake note:  "Samantha Sullivan"noted a lump in her right breast sometime in late October or early November, but there were "so many things going on in her life" she did not bring it to medical attention until she saw [name] 12/11/2015. Ms Samantha Sullivan was able to palpate a mass at the 9:30 o'clock position in the right breast, which was mobile and nontender. The left breast is status post prior remote biopsy and there was some scar tissue at the 7:00 position. The patient was set up for bilateral diagnostic Mammography with tomosynthesis at [facility] 12/27/2015. This found the breast density to be category C. There was a new oval mass in the right breast upper outer quadrant. Ultrasound the same day confirmed a 2.2 cm mass with irregular margins in the right breast at the 9:00 position read as highly suggestive of malignancy.  Biopsy of the  right breast mass in question 01/02/2016 found ([id number]) an invasive ductal carcinoma, grade 1, estrogen receptor 100% positive, progesterone receptor 90% positive, both with strong staining intensity, with an MIB-1 of 10%, and no HER-2 amplification, the signals ratio being 1.32 and the number  per cell 2.05. The patient was informed and instructed to stop hormone replacement therapy (last dose 01/04/2016).  Her subsequent history is as detailed below.    PAST MEDICAL HISTORY: Past Medical History:  Diagnosis Date  . Abnormal CT of the chest 12/06   numeroous lung nodules - most have resolved.  . Arthritis    psoriatic arthritis- toes & feet, but resolved at this point- no need meds at this point    . [facility]    right breast cancer  . Complication of anesthesia    /w colonoscopy- woke up before procedure was complete  . Family history of adverse reaction to anesthesia    pt's mother reported difficulty being given " enough " medicine to outcome a successful anesth. experience  . Family history of leukemia   . Family history of ovarian cancer   . History of meningioma Mar 21, 2006   in 03-21-2012 stable on MRI and recheck in 5 years, no surgery necessry   . HSV infection 1970's- 1980's  . Psoriatic arthritis ([place])     PAST SURGICAL HISTORY: Past Surgical History:  Procedure Laterality Date  . BILATERAL TOTAL MASTECTOMY WITH AXILLARY LYMPH NODE DISSECTION Bilateral 01/04/2018   Procedure: BILATERAL TOTAL MASTECTOMY PROPHYLATIC RIGHT  WITH LEFT SENTINEAL  LYMPH NODE;  Surgeon: Excell Seltzer, MD;  Location: [place];  Service: General;  Laterality: Bilateral;  . BREAST LUMPECTOMY WITH RADIOACTIVE SEED AND SENTINEL LYMPH NODE BIOPSY Right 01/22/2016   Procedure: RIGHT BREAST LUMPECTOMY WITH RADIOACTIVE SEED AND SENTINEL LYMPH NODE BIOPSY;  Surgeon: Excell Seltzer, MD;  Location: [place];  Service: General;  Laterality: Right;  . BREAST SURGERY Left 1971, 22-Mar-2015   lumpectomy benign, 03-22-15- [facility]. Ca- R lumpectomy & radiation   . COLONOSCOPY  8/06   normal and recheck in 10 years  . HAMMER TOE SURGERY Left 22-Mar-2015  . MASTECTOMY Right 01/04/2018   PROPHYLATIC   . MASTECTOMY COMPLETE / SIMPLE W/ SENTINEL NODE BIOPSY Left 01/04/2018  . PORTACATH PLACEMENT Right 01/04/2018     Procedure: INSERTION PORT-A-CATH;  Surgeon: Excell Seltzer, MD;  Location: [place];  Service: General;  Laterality: Right;  . THYROIDECTOMY, PARTIAL Left 1983    FAMILY HISTORY Family History  Problem Relation Age of Onset  . Osteoporosis Mother   . Osteoporosis Sister   . Lung cancer Maternal Aunt        smoker  . Heart Problems Maternal Grandmother   . COPD Maternal Grandfather   . Neurodegenerative disease Maternal Aunt        corical-ganglion degeneration  . Ovarian cancer Maternal Aunt        dx in her 9s  . Leukemia Cousin        46s-40s; maternal cousin  [name] has essentially no information on her father's side of the family aside from the fact that he died in his 49s from heart disease. He had psoriasis. The patient's mother passed away March 21, 2017 age 79, due to breast cancer. The patient had 2 brothers who died from congenital heart disease shortly after birth. The patient has one sister. The patient's mother had one sister, the patient's aunt, diagnosed with ovarian cancer in her 17s.  GYNECOLOGIC HISTORY:  Patient's last  menstrual period was 03/22/2000 (approximate). Menarche age 54, first live birth age 54. The patient is GX P2. She went through menopause approximately 06-Mar-1989 but was taking hormone replacement until 01/04/2016. Since coming off replacement she has had insomnia but no hot flashes or vaginal dryness pleural bones so 4. 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 She is divorced and lives alone, with no pets. Her daughter [name] lives in [place]. She is disabled secondary to schizoaffective disorder. Daughter [name] lives in [place] and she is a Estate agent but mostly a homemaker. The patient has 3 grandchildren, the older 35, the other 2 being under 96 years old. The  patient attends [employer]. Her mother passed away in 03/06/2017 due to breast cancer.    ADVANCED DIRECTIVES: her daughter [name] is her healthcare power of attorney. She can be reached at [place]: Social History   Tobacco Use  . Smoking status: Former Smoker    Packs/day: 1.00    Years: 12.00    Pack years: 12.00    Types: Cigarettes    Last attempt to quit: 07/23/1981    Years since quitting: 36.5  . Smokeless tobacco: Never Used  Substance Use Topics  . Alcohol use: Yes    Alcohol/week: 0.0 oz    Comment: social occasion only- special   . Drug use: No     Colonoscopy: ; [place]  OEU:MPNTIRW 2016/[place]  Bone density:rJanuary 2018/normal   Lipid panel:  Allergies  Allergen Reactions  . Bee Venom   . Norco [Hydrocodone-Acetaminophen] Other (See Comments)    Pt states Norco made her extremely hyper after her surgery and she was not able to sleep.    Current Outpatient Medications  Medication Sig Dispense Refill  . dexamethasone (DECADRON) 4 MG tablet Take 2 tablets by mouth once a day on the day after chemotherapy and then take 2 tablets two times a day for 2 days. Take with food. 30 tablet 1  . FLURANDRENOLIDE EX Apply 1 application topically 2 (two) times daily as needed (for psoriasis). Cordran Tape    . folic acid (FOLVITE) 1 MG tablet Take 2 mg by mouth every evening.    . lidocaine-prilocaine (EMLA) cream Apply to affected area once 30 g 3  . LORazepam (ATIVAN) 0.5 MG tablet Take 1 tablet (0.5 mg total) by mouth at bedtime as needed and may repeat dose one time if needed (Nausea or vomiting). 30 tablet 0  . Polyethyl Glycol-Propyl Glycol (LUBRICANT EYE DROPS) 0.4-0.3 % SOLN Place 1-2 drops into both eyes 3 (three) times daily as needed (dry eyes/irritated eyes.).    Marland Kitchen prochlorperazine (COMPAZINE) 10 MG tablet Take 1 tablet (10 mg total) by mouth every 6 (six) hours as needed (Nausea or vomiting). 30 tablet 1  . traMADol (ULTRAM) 50 MG  tablet Take 1 tablet (50 mg total) by mouth every 6 (six) hours as needed (mild pain). 20 tablet 0   Current Facility-Administered Medications  Medication Dose Route Frequency Provider Last Rate Last Dose  . triamcinolone acetonide (KENALOG) 10 MG/ML injection 10 mg  10 mg Other Once Wallene Huh, [place]        OBJECTIVE: middle-aged white woman who appears stated age  37:   02/12/18 1604 02/12/18 1617  BP: (!) 197/97 (!) 157/79  Sullivan:    Resp:  Temp:    SpO2:       Body mass index is 30.87 kg/m.    ECOG FS:1 - Symptomatic but completely ambulatory  Sclerae unicteric, EOMs intact Oropharynx clear and moist No cervical or supraclavicular adenopathy Lungs no rales or rhonchi Heart regular rate and rhythm Abd soft, nontender, positive bowel sounds MSK no focal spinal tenderness, no upper extremity lymphedema Neuro: nonfocal, well oriented, appropriate affect Breasts: Status post bilateral mastectomies.  There is no significant swelling, erythema and no dehiscence.  Both axillae are benign   LAB RESULTS:  CMP     Component Value Date/Time   NA 139 01/04/2018 1212   NA 141 08/05/2017 1335   K 4.1 01/04/2018 1212   K 4.5 08/05/2017 1335   CL 107 01/04/2018 1212   CO2 24 01/04/2018 1212   CO2 29 08/05/2017 1335   GLUCOSE 92 01/04/2018 1212   GLUCOSE 77 08/05/2017 1335   BUN 20 01/04/2018 1212   BUN 13.4 08/05/2017 1335   CREATININE 0.79 01/04/2018 1212   CREATININE 0.8 08/05/2017 1335   CALCIUM 9.4 01/04/2018 1212   CALCIUM 10.0 08/05/2017 1335   PROT 6.8 08/05/2017 1335   ALBUMIN 3.6 08/05/2017 1335   AST 19 08/05/2017 1335   ALT 12 08/05/2017 1335   ALKPHOS 169 (H) 08/05/2017 1335   BILITOT 0.63 08/05/2017 1335   GFRNONAA >60 01/04/2018 1212   GFRAA >60 01/04/2018 1212    INo results found for: SPEP, UPEP  Lab Results  Component Value Date   WBC 13.5 (H) 01/05/2018   NEUTROABS 3.2 08/05/2017   HGB 13.1 01/05/2018   HCT 38.6 01/05/2018   MCV 89.8  01/05/2018   PLT 280 01/05/2018      Chemistry      Component Value Date/Time   NA 139 01/04/2018 1212   NA 141 08/05/2017 1335   K 4.1 01/04/2018 1212   K 4.5 08/05/2017 1335   CL 107 01/04/2018 1212   CO2 24 01/04/2018 1212   CO2 29 08/05/2017 1335   BUN 20 01/04/2018 1212   BUN 13.4 08/05/2017 1335   CREATININE 0.79 01/04/2018 1212   CREATININE 0.8 08/05/2017 1335      Component Value Date/Time   CALCIUM 9.4 01/04/2018 1212   CALCIUM 10.0 08/05/2017 1335   ALKPHOS 169 (H) 08/05/2017 1335   AST 19 08/05/2017 1335   ALT 12 08/05/2017 1335   BILITOT 0.63 08/05/2017 1335       No results found for: LABCA2  No components found for: LABCA125  No results for input(s): INR in the last 168 hours.  Urinalysis    Component Value Date/Time   BILIRUBINUR neg 12/11/2015 0925   PROTEINUR neg 12/11/2015 0925   UROBILINOGEN negative 12/11/2015 0925   NITRITE neg 12/11/2015 0925   LEUKOCYTESUR Negative 12/11/2015 0925    STUDIES: Echocardiogram results discussed with the patient  RESEARCH: enrolled in UPBEAT study  ASSESSMENT: 67 y.o. [place] woman status post right breast upper outer quadrant biopsy 01/02/2016 for a clinical T2 N0, stage IIA invasive ductal carcinoma, grade 1, estrogen and progesterone receptor positive, HER-2 not amplified, with an MIB-1 of 10%.  (1). Right lumpectomy and sentinel lymph node sampling 01/22/2016 showed a pT1c pN0, stage IA invasive ductal carcinoma, grade 1, with close but negative margins. Repeat HER-2 testing was again not amplified   (3) Oncotype score of 18 predicts a risk of recurrence outside the breast within the next 10 years of 11% if the patient's only systemic therapy  is tamoxifen for 5 years. It also predicts no significant benefit from chemotherapy.     (4) adjuvant radiation3/14/17-4/11/17 Site/dose:   Right breast - 42.72 gy at 2.67 Gy per fraction x 16 fractions followed by an electron boost to the right breast with 10  Gy at 2 Gy per fraction x 5 fractions.    (5)  Anastrozole started 04/21/2016, discontinued after approximately 1 month with significant side effects  (a)  Bone density at [facility] September 2013 was normal  (b) bone density January 2018 was normal  (c) anastrozole restarted February 2018, held again at the start of chemotherapy February 2019  (6) genetics testing  02/24/2016 through the Breast/Ovarian cancer gene panel offered by [facility] found no deleterious mutations in ATM, BARD1, BRCA1, BRCA2, BRIP1, CDH1, CHEK2, EPCAM, FANCC, MLH1, MSH2, MSH6, NBN, PALB2, PMS2, PTEN, RAD51C, RAD51D, TP53, and XRCC2.   (7) left breast biopsy 12/17/2017 showed a cT2 cN0, stage IIA- IIB invasive ductal carcinoma, grade 2, estrogen receptor weakly positive, progesterone receptor negative, with HER-2 equivocal by FISH, negative by immunohistochemistry  (8) status post bilateral mastectomies and left sentinel lymph node sampling 01/04/2018 showing  (a) on the right, no evidence of disease  (b) on the left, a pT2 pN0, stage IIB invasive ductal carcinoma, grade 3, triple negative  (9) adjuvant chemotherapy will consist of cyclophosphamide and doxorubicin in dose dense fashion x4 starting 02/16/2018 followed by weekly paclitaxel x12  (a) consider NRG BR003 (randomized to +/- [place] with taxol treatments)  PLAN: Samantha Sullivan has generally recovered quite well from her breast cancer surgery.  She is now ready to proceed to adjuvant chemotherapy.  Today we again reviewed some of the possible toxicity side effects and complications of treatment and I gave her a "roadmap" on how to take her supportive medications.  All the prescriptions are written and the prescription for lorazepam was given to her to take to her pharmacy  She also received a prescription for cranial prosthesis and a 4 bras and breast prostheses which she may consider starting to wear after the first cycle of chemotherapy.  She had many ancillary  questions which we addressed.  I discouraged her from taking any supplements during chemotherapy unless they are thoroughly studied or unless it is as part of a study.  I commended her excellent exercise and diet program.  She will return next week for her first cycle, and I will see her a week later to troubleshoot problems.  I asked her to please keep a diary of symptoms.  That will help [place] make her subsequent cycles easier on her  I also encouraged her to call us with any questions or concerns.  Sten Dematteo, Virgie Dad, MD  02/12/18 4:50 PM Medical Oncology and Hematology [facility] [address] [place], [place] [address] Tel. [phone]    Fax. [phone]  This document serves as a record of services personally performed by Lurline Del, MD. It was created on his behalf by Sheron Nightingale, a trained medical scribe. The creation of this record is based on the scribe's personal observations and the provider's statements to them.   I have reviewed the above documentation for accuracy and completeness, and I agree with the above.

## 2018-02-12 ENCOUNTER — Inpatient Hospital Stay (HOSPITAL_BASED_OUTPATIENT_CLINIC_OR_DEPARTMENT_OTHER): Payer: PPO | Admitting: Oncology

## 2018-02-12 ENCOUNTER — Inpatient Hospital Stay: Payer: PPO | Admitting: *Deleted

## 2018-02-12 ENCOUNTER — Encounter: Payer: PPO | Admitting: Physical Therapy

## 2018-02-12 ENCOUNTER — Other Ambulatory Visit: Payer: Self-pay | Admitting: *Deleted

## 2018-02-12 ENCOUNTER — Ambulatory Visit: Payer: PPO | Admitting: Oncology

## 2018-02-12 VITALS — BP 144/89 | HR 72 | Temp 98.6°F | Resp 18 | Ht 65.0 in | Wt 185.5 lb

## 2018-02-12 DIAGNOSIS — Z9012 Acquired absence of left breast and nipple: Secondary | ICD-10-CM | POA: Diagnosis not present

## 2018-02-12 DIAGNOSIS — Z9011 Acquired absence of right breast and nipple: Secondary | ICD-10-CM

## 2018-02-12 DIAGNOSIS — Z170 Estrogen receptor positive status [ER+]: Secondary | ICD-10-CM | POA: Diagnosis not present

## 2018-02-12 DIAGNOSIS — C50411 Malignant neoplasm of upper-outer quadrant of right female breast: Secondary | ICD-10-CM | POA: Diagnosis not present

## 2018-02-12 DIAGNOSIS — C50912 Malignant neoplasm of unspecified site of left female breast: Secondary | ICD-10-CM

## 2018-02-12 DIAGNOSIS — Z9013 Acquired absence of bilateral breasts and nipples: Secondary | ICD-10-CM | POA: Diagnosis not present

## 2018-02-12 DIAGNOSIS — C50812 Malignant neoplasm of overlapping sites of left female breast: Secondary | ICD-10-CM

## 2018-02-12 DIAGNOSIS — R197 Diarrhea, unspecified: Secondary | ICD-10-CM | POA: Diagnosis not present

## 2018-02-12 DIAGNOSIS — Z87891 Personal history of nicotine dependence: Secondary | ICD-10-CM | POA: Diagnosis not present

## 2018-02-12 MED ORDER — LORAZEPAM 0.5 MG PO TABS: 0.5000 mg | ORAL_TABLET | Freq: Every evening | ORAL | 0 refills | Status: DC | PRN

## 2018-02-12 MED ORDER — DEXAMETHASONE 4 MG PO TABS: ORAL_TABLET | ORAL | 1 refills | Status: DC

## 2018-02-12 MED ORDER — PROCHLORPERAZINE MALEATE 10 MG PO TABS: 10.0000 mg | ORAL_TABLET | Freq: Four times a day (QID) | ORAL | 1 refills | Status: DC | PRN

## 2018-02-12 MED ORDER — LIDOCAINE-PRILOCAINE 2.5-2.5 % EX CREA: TOPICAL_CREAM | CUTANEOUS | 3 refills | Status: DC

## 2018-02-12 NOTE — Therapy (Signed)
[place], [place], [address] Phone: [phone]   Fax:  [phone]  Physical Therapy Treatment  Patient Details  Name: Samantha Sullivan MRN: [id number] Date of Birth: [birth year] Referring Provider: Dr. Excell Seltzer    Encounter Date: 02/11/2018  PT End of Session - 02/12/18 0751    Visit Number  5    Number of Visits  9    Date for PT Re-Evaluation  02/26/18    PT Start Time  7672    PT Stop Time  1445    PT Time Calculation (min)  60 min    Activity Tolerance  Patient tolerated treatment well    Behavior During Therapy  [facility] for tasks assessed/performed       Past Medical History:  Diagnosis Date  . Abnormal CT of the chest 12/06   numeroous lung nodules - most have resolved.  . Arthritis    psoriatic arthritis- toes & feet, but resolved at this point- no need meds at this point    . Cancer [facility])    right breast cancer  . Complication of anesthesia    /w colonoscopy- woke up before procedure was complete  . Family history of adverse reaction to anesthesia    pt's mother reported difficulty being given " enough " medicine to outcome a successful anesth. experience  . Family history of leukemia   . Family history of ovarian cancer   . History of meningioma 2007   in 2013 stable on MRI and recheck in 5 years, no surgery necessry   . HSV infection 1970's- 1980's  . Psoriatic arthritis [facility])     Past Surgical History:  Procedure Laterality Date  . BILATERAL TOTAL MASTECTOMY WITH AXILLARY LYMPH NODE DISSECTION Bilateral 01/04/2018   Procedure: BILATERAL TOTAL MASTECTOMY PROPHYLATIC RIGHT  WITH LEFT SENTINEAL  LYMPH NODE;  Surgeon: Excell Seltzer, MD;  Location: [place];  Service: General;  Laterality: Bilateral;  . BREAST LUMPECTOMY WITH RADIOACTIVE SEED AND SENTINEL LYMPH NODE BIOPSY Right 01/22/2016   Procedure: RIGHT BREAST LUMPECTOMY WITH RADIOACTIVE SEED AND SENTINEL LYMPH NODE BIOPSY;   Surgeon: Excell Seltzer, MD;  Location: [place];  Service: General;  Laterality: Right;  . BREAST SURGERY Left 1971, 2016   lumpectomy benign, 2016- [facility]. Ca- R lumpectomy & radiation   . COLONOSCOPY  8/06   normal and recheck in 10 years  . HAMMER TOE SURGERY Left 2016  . MASTECTOMY Right 01/04/2018   PROPHYLATIC   . MASTECTOMY COMPLETE / SIMPLE W/ SENTINEL NODE BIOPSY Left 01/04/2018  . PORTACATH PLACEMENT Right 01/04/2018   Procedure: INSERTION PORT-A-CATH;  Surgeon: Excell Seltzer, MD;  Location: [place];  Service: General;  Laterality: Right;  . THYROIDECTOMY, PARTIAL Left 1983    There were no vitals filed for this visit.  Subjective Assessment - 02/12/18 0751    Subjective  Pt is going to [facility] tomorrow morning and then sees Dr. Jana Hakim tomorrow afternoon to decided on course of chemo. she has been wearing her binder for compression to her chest.  She says her arm feels better with the Tg soft.  Compression sleeve has been ordered and will arrive on Tuesday  Pt states she has noticed more cording in her abdomen.  She comes in with kinesiotape intact and feels that it may have helped her axiallry fullness     Pertinent History  right breast cancer in 2017 with lumpectomy , sentinel node biopsy and radiation ( no chemo) left breast  cancer in Dec. 26 on regular mammogram and had bilataral mastectomy with left node biospy (3 nodes ) on Jan 04 2018 drains removed 2 1/2 weeks later . has a port on her right side and plans to have 5 months of chemo but no radiation     Patient Stated Goals  get back to the normal "me"                       [facility] Adult PT Treatment/Exercise - 02/12/18 0001      Self-Care   Other Self-Care Comments                         Long Term Clinic Goals - 02/09/18 1631      CC Long Term Goal  #1   Title  Pt will report a decrease in pain in left arm by 50%    Time  4    Period  Weeks    Status  On-going       CC Long Term Goal  #2   Title  Pt will have increased abduction of both shoulder to 140 degrees so that she can perform self care and household chores easier at home     Baseline  goal met for right arm on 02/09/2018     Time  4    Period  Weeks    Status  On-going      CC Long Term Goal  #3   Title  Pt will verbalize and understading of lymphedema risk reduction and be able to manage her post op swelling at home     Time  4    Period  Weeks    Status  On-going      CC Long Term Goal  #4   Title  Pt will be independent in a home exercise program     Time  4    Period  Weeks    Status  On-going         Plan - 02/12/18 0757    Clinical Impression Statement  Pt continues to have fluctuance in right chest with movement seen under arm when skin is palpated medially. contacted Dr. Lear Ng office about possible aspiration as  pt has not responded quickly to conservative measures.  The PA could not see her today, but hopefully pt can see Drl Hoxworth on Monday before chemo starts on Tuesday.  Pt will also discuss with Dr. Jana Hakim tomorrow.  Feel that she got some improvmemt with kinesiotape to fullness at left axilla today as it is much softer so it was reapplied today     Clinical Impairments Affecting Rehab Potential  lymph nodes removed from both axillae    PT Duration  4 weeks    PT Next Visit Plan  Reassess. coninue with exercise and soft tissue work and , Manual lymph draiange to left arm and right chest and lateral trunk, progress A/AA/PRPM of both shoulders with strengthening, educate about ABC class  considrer kinesiotape     PT Home Exercise Plan  supine dowel flexion and abduction     Consulted and Agree with Plan of Care  Patient       Patient will benefit from skilled therapeutic intervention in order to improve the following deficits and impairments:  Decreased knowledge of use of DME, Decreased skin integrity, Increased fascial restricitons, Pain, Postural dysfunction,  Decreased scar mobility, Decreased range of motion, Decreased strength, Impaired  UE functional use, Increased edema, Decreased knowledge of precautions  Visit Diagnosis: Aftercare following surgery for neoplasm  Stiffness of right shoulder, not elsewhere classified  Stiffness of left shoulder, not elsewhere classified  Pain in left arm  Muscle weakness (generalized)  Localized swelling, mass and lump, trunk     Problem List Patient Active Problem List   Diagnosis Date Noted  . Stage I breast cancer, left ([place]) 01/04/2018  . Malignant neoplasm of overlapping sites of left breast ([place]) 12/26/2017  . Genetic testing 02/26/2016  . Family history of ovarian cancer   . Family history of leukemia   . Psoriatic arthritis ([place]) 01/15/2016  . Meningioma ([place]) 01/15/2016  . Malignant neoplasm of upper-outer quadrant of right breast in female, estrogen receptor positive ([place]) 01/04/2016   Donato Heinz. Owens Shark PT  Norwood Levo 02/12/2018, 8:02 AM  [place], [place], [address] Phone: [phone]   Fax:  [phone]  Name: Samantha Sullivan MRN: [id number] Date of Birth: [birth year]

## 2018-02-12 NOTE — Progress Notes (Signed)
[id number] UPBEAT study  This RN met with patient Samantha Sullivan for the purpose of consenting to the above research study.  Please see the Consent Encounter for documentation of the consenting process.  Following the signing of the consent form, the patient had her vital signs checked and her blood pressure measured per protocol requirements.  These blood pressures were taken on her leg, and the readings were elevated.  Please see documentation of vital signs in the patient's encounter from today's date (02/12/18) with Dr. Jana Hakim.  Dr. Jana Hakim was informed of the readings but he stated that these were not accurate due to taking the blood pressure on the leg.  The patient denies any symptoms, such as headache, and denies any reason for a high blood pressure, such as anxiety or stress.  Patient states that she feels fine and does not believe the blood pressure readings to be accurate. Following the completion of the patient's visit with Dr. Jana Hakim, this RN repeated the blood pressure measurements according to protocol specifications.  Again, please see documentation of vital signs from the encounter with Dr. Jana Hakim from today (02/12/18).  These blood pressures were measured at the patient's wrist, and were appropriate for study eligibility.  The patient states these readings are much closer to her typical blood pressure readings.   Patient completed a Release of Information Form during the visit.  Patient understands to return Monday morning at 9:30am to complete study procedures, with MRI at 11:00am.  Lab work will be completed on the day of chemotherapy, prior to chemotherapy start.   Patient is looking forward to spending the weekend with her daughter, grandson and granddaughter.  This RN thanked the patient for her time and  willingness to participate in research. Doreatha Martin, RN, BSN, [facility] 02/12/2018 5:29 PM

## 2018-02-15 ENCOUNTER — Other Ambulatory Visit: Payer: Self-pay | Admitting: *Deleted

## 2018-02-15 ENCOUNTER — Telehealth: Payer: Self-pay | Admitting: Oncology

## 2018-02-15 ENCOUNTER — Ambulatory Visit (HOSPITAL_COMMUNITY)
Admission: RE | Admit: 2018-02-15 | Discharge: 2018-02-15 | Disposition: A | Discharge status: Home / Self Care | Payer: PPO | Source: Ambulatory Visit | Attending: Oncology | Admitting: Oncology

## 2018-02-15 ENCOUNTER — Inpatient Hospital Stay: Payer: PPO | Admitting: *Deleted

## 2018-02-15 ENCOUNTER — Encounter: Payer: Self-pay | Admitting: *Deleted

## 2018-02-15 DIAGNOSIS — Z006 Encounter for examination for normal comparison and control in clinical research program: Secondary | ICD-10-CM

## 2018-02-15 DIAGNOSIS — C50812 Malignant neoplasm of overlapping sites of left female breast: Secondary | ICD-10-CM

## 2018-02-15 DIAGNOSIS — C50912 Malignant neoplasm of unspecified site of left female breast: Secondary | ICD-10-CM | POA: Diagnosis not present

## 2018-02-15 DIAGNOSIS — C50411 Malignant neoplasm of upper-outer quadrant of right female breast: Secondary | ICD-10-CM

## 2018-02-15 DIAGNOSIS — Z170 Estrogen receptor positive status [ER+]: Principal | ICD-10-CM

## 2018-02-15 DIAGNOSIS — C50911 Malignant neoplasm of unspecified site of right female breast: Secondary | ICD-10-CM | POA: Diagnosis not present

## 2018-02-15 NOTE — Telephone Encounter (Signed)
Per 2/22 no los °

## 2018-02-15 NOTE — Progress Notes (Signed)
[id number]  02/15/18 9:45 AM Patient Samantha Sullivan returns this morning for Baseline procedures for the UPBEAT study.  Patient reports feeling well, no changes in health since last seeing her.    This RN reviewed the patient's blood pressure readings with Kenton Kingfisher, RN MS, prior to the patient's appointment.  The erroneous blood pressure readings have been documented as such and patient remains eligible for study participation.  Patient completed patient questionnaires, followed by Neurocognitive testing completed with Research Assistant Vernetta Honey.  Neurocognitive testing was followed by the six minute walk test and physical function measures were initiated and completed up to the gait speed test.  The patient was then escorted by this RN to MRI for completion of the cardiac MRI.  Following completion of the cMRI, the remaining physical function measures of the gait speed tests and chair stand were completed.  This RN ensured all questions on the questionnaire's were completed; the patient missed one question which was pointed out by this RN and then answered by the patient.  The patient had a score of 1 on the depressive symptoms questionnaire so physician notification was not required.    The patient was thanked for her time and participation during today's portion of the baseline visit.  The patient will have study labs drawn tomorrow morning at the same time as her routine labs are drawn.  The patient did NOT consent to optional blood sample so this specimen is not to be drawn.  The patient will be provided with the gift card compensation tomorrow following the collection of the blood sample.  The patient acknowledges this.  Doreatha Martin, RN, BSN, [facility] 02/15/2018 4:53 PM

## 2018-02-16 ENCOUNTER — Other Ambulatory Visit: Payer: Self-pay | Admitting: Oncology

## 2018-02-16 ENCOUNTER — Inpatient Hospital Stay: Payer: PPO

## 2018-02-16 ENCOUNTER — Encounter: Payer: PPO | Admitting: Physical Therapy

## 2018-02-16 VITALS — BP 131/61 | HR 94 | Temp 99.0°F | Resp 17

## 2018-02-16 DIAGNOSIS — C50812 Malignant neoplasm of overlapping sites of left female breast: Secondary | ICD-10-CM

## 2018-02-16 DIAGNOSIS — C50411 Malignant neoplasm of upper-outer quadrant of right female breast: Secondary | ICD-10-CM | POA: Diagnosis not present

## 2018-02-16 DIAGNOSIS — Z170 Estrogen receptor positive status [ER+]: Principal | ICD-10-CM

## 2018-02-16 DIAGNOSIS — Z95828 Presence of other vascular implants and grafts: Secondary | ICD-10-CM | POA: Insufficient documentation

## 2018-02-16 LAB — CMP (CANCER CENTER ONLY)
ALBUMIN: 3.7 g/dL (ref 3.5–5.0)
ALK PHOS: 158 U/L — AB (ref 40–150)
ALT: 13 U/L (ref 0–55)
AST: 17 U/L (ref 5–34)
Anion gap: 8 (ref 3–11)
BUN: 13 mg/dL (ref 7–26)
CALCIUM: 9.6 mg/dL (ref 8.4–10.4)
CHLORIDE: 107 mmol/L (ref 98–109)
CO2: 27 mmol/L (ref 22–29)
CREATININE: 0.74 mg/dL (ref 0.60–1.10)
GFR, Est AFR Am: >60 mL/min (ref >60)
GFR, Estimated: >60 mL/min (ref >60)
GLUCOSE: 95 mg/dL (ref 70–140)
Potassium: 3.9 mmol/L (ref 3.5–5.1)
Sodium: 142 mmol/L (ref 136–145)
Total Bilirubin: 0.7 mg/dL (ref 0.2–1.2)
Total Protein: 6.7 g/dL (ref 6.4–8.3)

## 2018-02-16 LAB — RESEARCH LABS

## 2018-02-16 LAB — CBC WITH DIFFERENTIAL (CANCER CENTER ONLY)
Basophils Absolute: 0 10*3/uL (ref 0.0–0.1)
Basophils Relative: 0 %
Eosinophils Absolute: 0.1 10*3/uL (ref 0.0–0.5)
Eosinophils Relative: 2 %
HCT: 39 % (ref 34.8–46.6)
HEMOGLOBIN: 13.1 g/dL (ref 11.6–15.9)
LYMPHS ABS: 1.5 10*3/uL (ref 0.9–3.3)
Lymphocytes Relative: 27 %
MCH: 30.3 pg (ref 25.1–34.0)
MCHC: 33.6 g/dL (ref 31.5–36.0)
MCV: 90.1 fL (ref 79.5–101.0)
MONO ABS: 0.5 10*3/uL (ref 0.1–0.9)
MONOS PCT: 10 %
NEUTROS PCT: 61 %
Neutro Abs: 3.5 10*3/uL (ref 1.5–6.5)
Platelet Count: 239 10*3/uL (ref 145–400)
RBC: 4.33 MIL/uL (ref 3.70–5.45)
RDW: 13.3 % (ref 11.2–14.5)
WBC Count: 5.6 10*3/uL (ref 3.9–10.3)

## 2018-02-16 MED ORDER — DOXORUBICIN HCL CHEMO IV INJECTION 2 MG/ML
60.0000 mg/m2 | Freq: Once | INTRAVENOUS | Status: AC
  Administered 2018-02-16: 118 mg via INTRAVENOUS
  Filled 2018-02-16: qty 59

## 2018-02-16 MED ORDER — SODIUM CHLORIDE 0.9% FLUSH
10.0000 mL | INTRAVENOUS | Status: DC | PRN
  Administered 2018-02-16: 10 mL
  Filled 2018-02-16: qty 10

## 2018-02-16 MED ORDER — HEPARIN SOD (PORK) LOCK FLUSH 100 UNIT/ML IV SOLN
500.0000 [IU] | Freq: Once | INTRAVENOUS | Status: AC | PRN
  Administered 2018-02-16: 500 [IU]
  Filled 2018-02-16: qty 5

## 2018-02-16 MED ORDER — PEGFILGRASTIM 6 MG/0.6ML ~~LOC~~ PSKT
6.0000 mg | PREFILLED_SYRINGE | Freq: Once | SUBCUTANEOUS | Status: AC
  Administered 2018-02-16: 6 mg via SUBCUTANEOUS

## 2018-02-16 MED ORDER — PALONOSETRON HCL INJECTION 0.25 MG/5ML
0.2500 mg | Freq: Once | INTRAVENOUS | Status: AC
  Administered 2018-02-16: 0.25 mg via INTRAVENOUS

## 2018-02-16 MED ORDER — SODIUM CHLORIDE 0.9 % IV SOLN
Freq: Once | INTRAVENOUS | Status: AC
  Administered 2018-02-16: 09:00:00 via INTRAVENOUS

## 2018-02-16 MED ORDER — SODIUM CHLORIDE 0.9 % IV SOLN
600.0000 mg/m2 | Freq: Once | INTRAVENOUS | Status: AC
  Administered 2018-02-16: 1180 mg via INTRAVENOUS
  Filled 2018-02-16: qty 59

## 2018-02-16 MED ORDER — FOSAPREPITANT DIMEGLUMINE INJECTION 150 MG
Freq: Once | INTRAVENOUS | Status: AC
  Administered 2018-02-16: 09:00:00 via INTRAVENOUS
  Filled 2018-02-16: qty 5

## 2018-02-16 MED ORDER — PALONOSETRON HCL INJECTION 0.25 MG/5ML
INTRAVENOUS | Status: AC
Start: 2018-02-16 — End: 2018-02-16
  Filled 2018-02-16: qty 5

## 2018-02-16 NOTE — Patient Instructions (Signed)
[facility] Discharge Instructions for Patients Receiving Chemotherapy  Today you received the following chemotherapy agents: Doxorubicin (Adriamycin) and Cyclophosphamide (Cytoxan).  To help prevent nausea and vomiting after your treatment, we encourage you to take your nausea medication as prescribed. Received Aloxi during treatment-->take Compazine (not Zofran) for the next 3 days.  If you develop nausea and vomiting that is not controlled by your nausea medication, call the clinic.   BELOW ARE SYMPTOMS THAT SHOULD BE REPORTED IMMEDIATELY:  *FEVER GREATER THAN 100.5 F  *CHILLS WITH OR WITHOUT FEVER  NAUSEA AND VOMITING THAT IS NOT CONTROLLED WITH YOUR NAUSEA MEDICATION  *UNUSUAL SHORTNESS OF BREATH  *UNUSUAL BRUISING OR BLEEDING  TENDERNESS IN MOUTH AND THROAT WITH OR WITHOUT PRESENCE OF ULCERS  *URINARY PROBLEMS  *BOWEL PROBLEMS  UNUSUAL RASH Items with * indicate a potential emergency and should be followed up as soon as possible.  Feel free to call the clinic should you have any questions or concerns. The clinic phone number is [phone].  Please show the [facility] at check-in to the Emergency Department and triage nurse.  Doxorubicin injection What is this medicine? DOXORUBICIN (dox oh ROO bi sin) is a chemotherapy drug. It is used to treat many kinds of cancer like leukemia, lymphoma, neuroblastoma, sarcoma, and Wilms' tumor. It is also used to treat bladder cancer, breast cancer, lung cancer, ovarian cancer, stomach cancer, and thyroid cancer. This medicine may be used for other purposes; ask your health care provider or pharmacist if you have questions. COMMON BRAND NAME(S): Adriamycin, Adriamycin PFS, Adriamycin RDF, Rubex What should I tell my health care provider before I take this medicine? They need to know if you have any of these conditions: -heart disease -history of low blood counts caused by a medicine -liver disease -recent or  ongoing radiation therapy -an unusual or allergic reaction to doxorubicin, other chemotherapy agents, other medicines, foods, dyes, or preservatives -pregnant or trying to get pregnant -breast-feeding How should I use this medicine? This drug is given as an infusion into a vein. It is administered in a hospital or clinic by a specially trained health care professional. If you have pain, swelling, burning or any unusual feeling around the site of your injection, tell your health care professional right away. Talk to your pediatrician regarding the use of this medicine in children. Special care may be needed. Overdosage: If you think you have taken too much of this medicine contact a poison control center or emergency room at once. NOTE: This medicine is only for you. Do not share this medicine with others. What if I miss a dose? It is important not to miss your dose. Call your doctor or health care professional if you are unable to keep an appointment. What may interact with this medicine? This medicine may interact with the following medications: -6-mercaptopurine -paclitaxel -phenytoin -St. John's Wort -trastuzumab -verapamil This list may not describe all possible interactions. Give your health care provider a list of all the medicines, herbs, non-prescription drugs, or dietary supplements you use. Also tell them if you smoke, drink alcohol, or use illegal drugs. Some items may interact with your medicine. What should I watch for while using this medicine? This drug may make you feel generally unwell. This is not uncommon, as chemotherapy can affect healthy cells as well as cancer cells. Report any side effects. Continue your course of treatment even though you feel ill unless your doctor tells you to stop. There is a maximum amount  of this medicine you should receive throughout your life. The amount depends on the medical condition being treated and your overall health. Your doctor will  watch how much of this medicine you receive in your lifetime. Tell your doctor if you have taken this medicine before. You may need blood work done while you are taking this medicine. Your urine may turn red for a few days after your dose. This is not blood. If your urine is dark or brown, call your doctor. In some cases, you may be given additional medicines to help with side effects. Follow all directions for their use. Call your doctor or health care professional for advice if you get a fever, chills or sore throat, or other symptoms of a cold or flu. Do not treat yourself. This drug decreases your body's ability to fight infections. Try to avoid being around people who are sick. This medicine may increase your risk to bruise or bleed. Call your doctor or health care professional if you notice any unusual bleeding. Talk to your doctor about your risk of cancer. You may be more at risk for certain types of cancers if you take this medicine. Do not become pregnant while taking this medicine or for 6 months after stopping it. Women should inform their doctor if they wish to become pregnant or think they might be pregnant. Men should not father a child while taking this medicine and for 6 months after stopping it. There is a potential for serious side effects to an unborn child. Talk to your health care professional or pharmacist for more information. Do not breast-feed an infant while taking this medicine. This medicine has caused ovarian failure in some women and reduced sperm counts in some men This medicine may interfere with the ability to have a child. Talk with your doctor or health care professional if you are concerned about your fertility. What side effects may I notice from receiving this medicine? Side effects that you should report to your doctor or health care professional as soon as possible: -allergic reactions like skin rash, itching or hives, swelling of the face, lips, or  tongue -breathing problems -chest pain -fast or irregular heartbeat -low blood counts - this medicine may decrease the number of white blood cells, red blood cells and platelets. You may be at increased risk for infections and bleeding. -pain, redness, or irritation at site where injected -signs of infection - fever or chills, cough, sore throat, pain or difficulty passing urine -signs of decreased platelets or bleeding - bruising, pinpoint red spots on the skin, black, tarry stools, blood in the urine -swelling of the ankles, feet, hands -tiredness -weakness Side effects that usually do not require medical attention (report to your doctor or health care professional if they continue or are bothersome): -diarrhea -hair loss -mouth sores -nail discoloration or damage -nausea -red colored urine -vomiting This list may not describe all possible side effects. Call your doctor for medical advice about side effects. You may report side effects to FDA at 1-800-FDA-1088. Where should I keep my medicine? This drug is given in a hospital or clinic and will not be stored at home. NOTE: This sheet is a summary. It may not cover all possible information. If you have questions about this medicine, talk to your doctor, pharmacist, or health care provider.  2018 Elsevier/Gold Standard (2016-02-04 11:28:51)  Cyclophosphamide injection What is this medicine? CYCLOPHOSPHAMIDE (sye kloe FOSS fa mide) is a chemotherapy drug. It slows the growth of cancer  cells. This medicine is used to treat many types of cancer like lymphoma, myeloma, leukemia, breast cancer, and ovarian cancer, to name a few. This medicine may be used for other purposes; ask your health care provider or pharmacist if you have questions. COMMON BRAND NAME(S): Cytoxan, Neosar What should I tell my health care provider before I take this medicine? They need to know if you have any of these conditions: -blood disorders -history of other  chemotherapy -infection -kidney disease -liver disease -recent or ongoing radiation therapy -tumors in the bone marrow -an unusual or allergic reaction to cyclophosphamide, other chemotherapy, other medicines, foods, dyes, or preservatives -pregnant or trying to get pregnant -breast-feeding How should I use this medicine? This drug is usually given as an injection into a vein or muscle or by infusion into a vein. It is administered in a hospital or clinic by a specially trained health care professional. Talk to your pediatrician regarding the use of this medicine in children. Special care may be needed. Overdosage: If you think you have taken too much of this medicine contact a poison control center or emergency room at once. NOTE: This medicine is only for you. Do not share this medicine with others. What if I miss a dose? It is important not to miss your dose. Call your doctor or health care professional if you are unable to keep an appointment. What may interact with this medicine? This medicine may interact with the following medications: -amiodarone -amphotericin B -azathioprine -certain antiviral medicines for HIV or AIDS such as protease inhibitors (e.g., indinavir, ritonavir) and zidovudine -certain blood pressure medications such as benazepril, captopril, enalapril, fosinopril, lisinopril, moexipril, monopril, perindopril, quinapril, ramipril, trandolapril -certain cancer medications such as anthracyclines (e.g., daunorubicin, doxorubicin), busulfan, cytarabine, paclitaxel, pentostatin, tamoxifen, trastuzumab -certain diuretics such as chlorothiazide, chlorthalidone, hydrochlorothiazide, indapamide, metolazone -certain medicines that treat or prevent blood clots like warfarin -certain muscle relaxants such as succinylcholine -cyclosporine -etanercept -indomethacin -medicines to increase blood counts like filgrastim, pegfilgrastim, sargramostim -medicines used as general  anesthesia -metronidazole -natalizumab This list may not describe all possible interactions. Give your health care provider a list of all the medicines, herbs, non-prescription drugs, or dietary supplements you use. Also tell them if you smoke, drink alcohol, or use illegal drugs. Some items may interact with your medicine. What should I watch for while using this medicine? Visit your doctor for checks on your progress. This drug may make you feel generally unwell. This is not uncommon, as chemotherapy can affect healthy cells as well as cancer cells. Report any side effects. Continue your course of treatment even though you feel ill unless your doctor tells you to stop. Drink water or other fluids as directed. Urinate often, even at night. In some cases, you may be given additional medicines to help with side effects. Follow all directions for their use. Call your doctor or health care professional for advice if you get a fever, chills or sore throat, or other symptoms of a cold or flu. Do not treat yourself. This drug decreases your body's ability to fight infections. Try to avoid being around people who are sick. This medicine may increase your risk to bruise or bleed. Call your doctor or health care professional if you notice any unusual bleeding. Be careful brushing and flossing your teeth or using a toothpick because you may get an infection or bleed more easily. If you have any dental work done, tell your dentist you are receiving this medicine. You may get drowsy or  dizzy. Do not drive, use machinery, or do anything that needs mental alertness until you know how this medicine affects you. Do not become pregnant while taking this medicine or for 1 year after stopping it. Women should inform their doctor if they wish to become pregnant or think they might be pregnant. Men should not father a child while taking this medicine and for 4 months after stopping it. There is a potential for serious side  effects to an unborn child. Talk to your health care professional or pharmacist for more information. Do not breast-feed an infant while taking this medicine. This medicine may interfere with the ability to have a child. This medicine has caused ovarian failure in some women. This medicine has caused reduced sperm counts in some men. You should talk with your doctor or health care professional if you are concerned about your fertility. If you are going to have surgery, tell your doctor or health care professional that you have taken this medicine. What side effects may I notice from receiving this medicine? Side effects that you should report to your doctor or health care professional as soon as possible: -allergic reactions like skin rash, itching or hives, swelling of the face, lips, or tongue -low blood counts - this medicine may decrease the number of white blood cells, red blood cells and platelets. You may be at increased risk for infections and bleeding. -signs of infection - fever or chills, cough, sore throat, pain or difficulty passing urine -signs of decreased platelets or bleeding - bruising, pinpoint red spots on the skin, black, tarry stools, blood in the urine -signs of decreased red blood cells - unusually weak or tired, fainting spells, lightheadedness -breathing problems -dark urine -dizziness -palpitations -swelling of the ankles, feet, hands -trouble passing urine or change in the amount of urine -weight gain -yellowing of the eyes or skin Side effects that usually do not require medical attention (report to your doctor or health care professional if they continue or are bothersome): -changes in nail or skin color -hair loss -missed menstrual periods -mouth sores -nausea, vomiting This list may not describe all possible side effects. Call your doctor for medical advice about side effects. You may report side effects to FDA at 1-800-FDA-1088. Where should I keep my  medicine? This drug is given in a hospital or clinic and will not be stored at home. NOTE: This sheet is a summary. It may not cover all possible information. If you have questions about this medicine, talk to your doctor, pharmacist, or health care provider.  2018 Elsevier/Gold Standard (2012-10-22 16:22:58)

## 2018-02-16 NOTE — Progress Notes (Signed)
[id number]  02/16/18 9:45AM  This RN met with patient Samantha Sullivan in the infusion room.  She is in good spirits and has her sister in law, Samantha Sullivan, with her.  Patient agreed to discussing her medical history and study participation with Samantha Sullivan present.  Medical history listed in Epic was confirmed as correct by the patient.  Medications were also confirmed: patient takes folic acid, 1 tablet PO QD PRN; uses flurandrenolide ex tape, 1 topical application QD PRN, both of these for history of psoriatic arthritis.  Patient uses the eye drops, as written, for dry irritated eyes but does not have a medical diagnosis of chronic dry eyes.  Patient will begin taking over the counter loratadine as recommended by Dr. Jana Hakim and other prescription medications, as recommended by Dr. Jana Hakim. Patient was thanked for her participation in the research study.  Patient verbalized an understanding that she would have blood drawn in 1 month and then have a full study visit in 3 months.  Patient was given a $25 [employer] gift card as the final step of her Baseline visit for the UPBEAT study.  This RN to be in touch with the patient to schedule her 3 month visit.  Doreatha Martin, RN, BSN, [facility] 02/16/2018 11:13 AM

## 2018-02-17 ENCOUNTER — Telehealth: Payer: Self-pay | Admitting: *Deleted

## 2018-02-17 NOTE — Telephone Encounter (Signed)
TCT patient to follow up with her after her 1st time treatment with Adriamycin and Cytoxan.   Spoke with patient. She states she feels well today. She did say she have a headache last night but it went away after using some tylenol.  She has her on pro on now and she states the neulasta in going in now.  Reminded pt to leave on for an hour before taking it off. She voiced understanding.  Encouraged her to keep up with her fluid and food intake.  She voices understanding to call with fever, chills, nausea/vomiting that is niot controlled with her anti-emtics. No other questions or concerns.

## 2018-02-17 NOTE — Telephone Encounter (Signed)
-----   Message from Zola Button, RN sent at 02/16/2018 12:32 PM EST ----- Regarding: Dr. Jana Hakim; 1st Time F/U Call Pt received first time Adriamycin and Cytoxan. Tolerated well. Thank you!

## 2018-02-18 ENCOUNTER — Ambulatory Visit: Payer: PPO | Admitting: Physical Therapy

## 2018-02-18 ENCOUNTER — Encounter: Payer: Self-pay | Admitting: Physical Therapy

## 2018-02-18 DIAGNOSIS — Z483 Aftercare following surgery for neoplasm: Secondary | ICD-10-CM

## 2018-02-18 DIAGNOSIS — M6281 Muscle weakness (generalized): Secondary | ICD-10-CM

## 2018-02-18 DIAGNOSIS — M79602 Pain in left arm: Secondary | ICD-10-CM

## 2018-02-18 DIAGNOSIS — R222 Localized swelling, mass and lump, trunk: Secondary | ICD-10-CM

## 2018-02-18 DIAGNOSIS — M25611 Stiffness of right shoulder, not elsewhere classified: Secondary | ICD-10-CM

## 2018-02-18 DIAGNOSIS — M25612 Stiffness of left shoulder, not elsewhere classified: Secondary | ICD-10-CM

## 2018-02-18 NOTE — Therapy (Signed)
[place], [place], [address] Phone: [phone]   Fax:  [phone]  Physical Therapy Treatment  Patient Details  Name: Samantha Sullivan MRN: [id number] Date of Birth: September 16, 1951 Referring Provider: Dr. Excell Seltzer    Encounter Date: 02/18/2018  PT End of Session - 02/18/18 1233    Visit Number  6    Number of Visits  9    Date for PT Re-Evaluation  02/26/18    PT Start Time  6962    PT Stop Time  1105    PT Time Calculation (min)  50 min    Activity Tolerance  Patient tolerated treatment well    Behavior During Therapy  [facility] for tasks assessed/performed       Past Medical History:  Diagnosis Date  . Abnormal CT of the chest 12/06   numeroous lung nodules - most have resolved.  . Arthritis    psoriatic arthritis- toes & feet, but resolved at this point- no need meds at this point    . Cancer [facility])    right breast cancer  . Complication of anesthesia    /w colonoscopy- woke up before procedure was complete  . Family history of adverse reaction to anesthesia    pt's mother reported difficulty being given " enough " medicine to outcome a successful anesth. experience  . Family history of leukemia   . Family history of ovarian cancer   . History of meningioma 2007   in 2013 stable on MRI and recheck in 5 years, no surgery necessry   . HSV infection 1970's- 1980's  . Psoriatic arthritis [facility])     Past Surgical History:  Procedure Laterality Date  . BILATERAL TOTAL MASTECTOMY WITH AXILLARY LYMPH NODE DISSECTION Bilateral 01/04/2018   Procedure: BILATERAL TOTAL MASTECTOMY PROPHYLATIC RIGHT  WITH LEFT SENTINEAL  LYMPH NODE;  Surgeon: Excell Seltzer, MD;  Location: [place];  Service: General;  Laterality: Bilateral;  . BREAST LUMPECTOMY WITH RADIOACTIVE SEED AND SENTINEL LYMPH NODE BIOPSY Right 01/22/2016   Procedure: RIGHT BREAST LUMPECTOMY WITH RADIOACTIVE SEED AND SENTINEL LYMPH NODE BIOPSY;   Surgeon: Excell Seltzer, MD;  Location: [place];  Service: General;  Laterality: Right;  . BREAST SURGERY Left 1971, 2016   lumpectomy benign, 2016- [facility]. Ca- R lumpectomy & radiation   . COLONOSCOPY  8/06   normal and recheck in 10 years  . HAMMER TOE SURGERY Left 2016  . MASTECTOMY Right 01/04/2018   PROPHYLATIC   . MASTECTOMY COMPLETE / SIMPLE W/ SENTINEL NODE BIOPSY Left 01/04/2018  . PORTACATH PLACEMENT Right 01/04/2018   Procedure: INSERTION PORT-A-CATH;  Surgeon: Excell Seltzer, MD;  Location: [place];  Service: General;  Laterality: Right;  . THYROIDECTOMY, PARTIAL Left 1983    There were no vitals filed for this visit.  Subjective Assessment - 02/18/18 1230    Subjective  Pt has had visit with [facility], Dr. Jana Hakim, Dr. Lear Ng PA and first chemo since she was last here.  she seems to be doing well but continues with cording in left arm, axilla and abdomen. The kinesiotape is not effective     Pertinent History  right breast cancer in 2017 with lumpectomy , sentinel node biopsy and radiation ( no chemo) left breast cancer in Dec. 26 on regular mammogram and had bilataral mastectomy with left node biospy (3 nodes ) on Jan 04 2018 drains removed 2 1/2 weeks later . has a port on her right side and plans to have  5 months of chemo but no radiation     Patient Stated Goals  get back to the normal "me"     Currently in Pain?  No/denies                      [facility] Adult PT Treatment/Exercise - 02/18/18 0001      Self-Care   Self-Care  Other Self-Care Comments    Other Self-Care Comments   pt comes in with [place] compression vest that seems to fit well       Manual Therapy   Manual therapy comments  removed kinesiotape with lotion on a tissue, no skin irriation, but it does not seem that it was effective in helping reduce swellint at left axilla     Edema Management  small tg soft  to left arm, chip packs to wear over fullness at incisions and  anterior axilla on bilateal chest under compressio bra     Manual Lymphatic Drainage (MLD)  with pt in supine short neck, superficial and deep abdominals. right inguinla nodes with right anteior and lateral chest below axlla and distal to port with care to avoid port, , also to sidelying for right back and more on lateral chest,  then to  left inguinal nodes, left chest, upper arm and forearm down into wrist                      Long Term Clinic Goals - 02/09/18 1631      CC Long Term Goal  #1   Title  Pt will report a decrease in pain in left arm by 50%    Time  4    Period  Weeks    Status  On-going      CC Long Term Goal  #2   Title  Pt will have increased abduction of both shoulder to 140 degrees so that she can perform self care and household chores easier at home     Baseline  goal met for right arm on 02/09/2018     Time  4    Period  Weeks    Status  On-going      CC Long Term Goal  #3   Title  Pt will verbalize and understading of lymphedema risk reduction and be able to manage her post op swelling at home     Time  4    Period  Weeks    Status  On-going      CC Long Term Goal  #4   Title  Pt will be independent in a home exercise program     Time  4    Period  Weeks    Status  On-going         Plan - 02/18/18 1233    Clinical Impression Statement  Pt continues with cording that is slowly improving.  Upgraded to compression vest today with extra chip packs to see if that will help with swelling especially on right chest and left axilla     Rehab Potential  Good    Clinical Impairments Affecting Rehab Potential  lymph nodes removed from both axillae    PT Frequency  2x / week    PT Duration  4 weeks    PT Treatment/Interventions  ADLs/Self Care Home Management;DME Instruction;Scar mobilization;Passive range of motion;Neuromuscular re-education;Therapeutic activities;Therapeutic exercise;Orthotic Fit/Training;Patient/family education;Compression  bandaging;Manual lymph drainage;Manual techniques;Taping    PT Next Visit Plan  Reassess. coninue with exercise  and soft tissue work and , Manual lymph draiange to left arm and right chest and lateral trunk, progress A/AA/PRPM of both shoulders with strengthening, educate about ABC class  considrer kinesiotape     Consulted and Agree with Plan of Care  Patient       Patient will benefit from skilled therapeutic intervention in order to improve the following deficits and impairments:  Decreased knowledge of use of DME, Decreased skin integrity, Increased fascial restricitons, Pain, Postural dysfunction, Decreased scar mobility, Decreased range of motion, Decreased strength, Impaired UE functional use, Increased edema, Decreased knowledge of precautions  Visit Diagnosis: Aftercare following surgery for neoplasm  Stiffness of right shoulder, not elsewhere classified  Stiffness of left shoulder, not elsewhere classified  Pain in left arm  Muscle weakness (generalized)  Localized swelling, mass and lump, trunk     Problem List Patient Active Problem List   Diagnosis Date Noted  . Port-A-Cath in place 02/16/2018  . Stage I breast cancer, left ([place]) 01/04/2018  . Malignant neoplasm of overlapping sites of left breast ([place]) 12/26/2017  . Genetic testing 02/26/2016  . Family history of ovarian cancer   . Family history of leukemia   . Psoriatic arthritis ([place]) 01/15/2016  . Meningioma ([place]) 01/15/2016  . Malignant neoplasm of upper-outer quadrant of right breast in female, estrogen receptor positive ([place]) 01/04/2016   Donato Heinz. Owens Shark PT  Norwood Levo 02/18/2018, 12:35 PM  [place], [place], [address] Phone: [phone]   Fax:  [phone]  Name: Samantha Sullivan MRN: [id number] Date of Birth: 06-04-1951

## 2018-02-22 NOTE — Progress Notes (Signed)
[place]  [phone] [phone]     ID: Samantha Sullivan DOB: 1951-09-28  MR#: [id number]  YHC#:[id number]  Patient Care Team: Samantha Baton, Samantha Sullivan as PCP - General (Internal Medicine) Samantha Sullivan, Samantha Dad, Samantha Sullivan as Consulting Physician (Oncology) Samantha Sullivan, Samantha Sullivan as Nurse Practitioner (Family Medicine) Samantha Sullivan, Samantha Sullivan, Samantha Sullivan as Consulting Physician (Podiatry) Samantha Seltzer, Samantha Sullivan as Consulting Physician (General Surgery) Samantha Sullivan, Samantha Prows, DO as Referring Physician (Student) PCP: Samantha Baton, Samantha Sullivan OTHER Samantha Sullivan:  CHIEF COMPLAINT: Contralateral breast cancer, triple negative  CURRENT TREATMENT: Adjuvant chemotherapy  INTERVAL HISTORY: Samantha Sullivan returns today for follow-up of her triple negative breast cancer. She received cycle 1 of cyclophosphamide and doxorubicin on 02/16/2018, today being day 9 from cycle 1 of 4 planned cycles.  She tolerated this treatent well.   She continues to receive Neulasta, with generally good tolerance. She notes that compazine makes her a little fatigued and foggy for the first 3-4 days. She also takes steroids which make her feel alert, which causes her to have difficulty sleeping. She used to take Ambien in the past, but she hasn't taken a sleep aid since then.     REVIEW OF SYSTEMS: Samantha Sullivan reports that she follow up with Samantha Sullivan. Excell Sullivan on 02/26/2018. She has fluid on the right breast in the right axilla. She is putting a lot of pressure on the area. She ate popcicles in order to prevent mouth sores. She woke up with a queasy stomach. She had a brief decrease in appetite, but she was able to eat well. She also experienced heart burn on days 3 and 4, and bone "pressures"  in the bilateral lower legs. She took tylenol to aid this. For exercise, she usually walks at least 2-2.5 miles per day, but it has been difficult for her to walk since having bad weather. She has been taking stool softeners twice per day with laxatives  on day 4 and 5  which helps her constipation. She plans to wear a wig when she looses her hair. She notes that her daughter has a cute collection of head wraps. She denies unusual headaches, visual changes, nausea, vomiting, or dizziness. There has been no unusual cough, phlegm production, or pleurisy. This been no change in bowel or bladder habits. She denies unexplained fatigue or unexplained weight loss, bleeding, rash, or fever. A detailed review of systems was otherwise stable.    RIGHT BREAST CANCER HISTORY:  From the original intake note:  "Samantha Sullivan"noted a lump in her right breast sometime in late October or early November, but there were "so many things going on in her life" she did not bring it to medical attention until she saw Samantha Sullivan 12/11/2015. Ms Samantha Sullivan was able to palpate a mass at the 9:30 o'clock position in the right breast, which was mobile and nontender. The left breast is status post prior remote biopsy and there was some scar tissue at the 7:00 position. The patient was set up for bilateral diagnostic Mammography with tomos[facility] at [facility] 12/27/2015. This found the breast density to be category C. There was a new oval mass in the right breast upper outer quadrant. Ultrasound the same day confirmed a 2.2 cm mass with irregular margins in the right breast at the 9:00 position read as highly suggestive of malignancy.  Biopsy of the right breast mass in question 01/02/2016 found ([id number]) an invasive ductal carcinoma, grade 1, estrogen receptor 100% positive, progesterone receptor 90% positive, both with strong staining intensity, with an MIB-1 of  10%, and no HER-2 amplification, the signals ratio being 1.32 and the number per cell 2.05. The patient was informed and instructed to stop hormone replacement therapy (last dose 01/04/2016).  Her subsequent history is as detailed below.    PAST MEDICAL HISTORY: Past Medical History:  Diagnosis Date  . Abnormal CT of the chest 12/06   numeroous  lung nodules - most have resolved.  . Arthritis    psoriatic arthritis- toes & feet, but resolved at this point- no need meds at this point    . Cancer [facility])    right breast cancer  . Complication of anesthesia    /w colonoscopy- woke up before procedure was complete  . Family history of adverse reaction to anesthesia    pt's mother reported difficulty being given " enough " medicine to outcome a successful anesth. experience  . Family history of leukemia   . Family history of ovarian cancer   . History of meningioma 03-26-2006   in March 26, 2012 stable on MRI and recheck in 5 years, no surgery necessry   . HSV infection 1970's- 1980's  . Psoriatic arthritis ([place])     PAST SURGICAL HISTORY: Past Surgical History:  Procedure Laterality Date  . BILATERAL TOTAL MASTECTOMY WITH AXILLARY LYMPH NODE DISSECTION Bilateral 01/04/2018   Procedure: BILATERAL TOTAL MASTECTOMY PROPHYLATIC RIGHT  WITH LEFT SENTINEAL  LYMPH NODE;  Surgeon: Samantha Seltzer, Samantha Sullivan;  Location: [place];  Service: General;  Laterality: Bilateral;  . BREAST LUMPECTOMY WITH RADIOACTIVE SEED AND SENTINEL LYMPH NODE BIOPSY Right 01/22/2016   Procedure: RIGHT BREAST LUMPECTOMY WITH RADIOACTIVE SEED AND SENTINEL LYMPH NODE BIOPSY;  Surgeon: Samantha Seltzer, Samantha Sullivan;  Location: [place];  Service: General;  Laterality: Right;  . BREAST SURGERY Left 1971, 03/27/15   lumpectomy benign, March 27, 2015- [facility]- R lumpectomy & radiation   . COLONOSCOPY  8/06   normal and recheck in 10 years  . HAMMER TOE SURGERY Left March 27, 2015  . MASTECTOMY Right 01/04/2018   PROPHYLATIC   . MASTECTOMY COMPLETE / SIMPLE W/ SENTINEL NODE BIOPSY Left 01/04/2018  . PORTACATH PLACEMENT Right 01/04/2018   Procedure: INSERTION PORT-A-CATH;  Surgeon: Samantha Seltzer, Samantha Sullivan;  Location: [place];  Service: General;  Laterality: Right;  . THYROIDECTOMY, PARTIAL Left 1983    FAMILY HISTORY Family History  Problem Relation Age of Onset  . Osteoporosis Mother   . Osteoporosis  Sister   . Lung cancer Maternal Aunt        smoker  . Heart Problems Maternal Grandmother   . COPD Maternal Grandfather   . Neurodegenerative disease Maternal Aunt        corical-ganglion degeneration  . Ovarian cancer Maternal Aunt        dx in her 49s  . Leukemia Cousin        74s-40s; maternal cousin  [name] has essentially no information on her father's side of the family aside from the fact that he died in his 28s from heart disease. He had psoriasis. The patient's mother passed away March 26, 2017 age 28, due to breast cancer. The patient had 2 brothers who died from congenital heart disease shortly after birth. The patient has one sister. The patient's mother had one sister, the patient's aunt, diagnosed with ovarian cancer in her 53s.  GYNECOLOGIC HISTORY:  Patient's last menstrual period was 03/22/2000 (approximate). Menarche age 45, first live birth age 57. The patient is GX P2. She went through menopause approximately 03/26/89 but was taking hormone replacement until 01/04/2016. Since coming  off replacement she has had insomnia but no hot flashes or vaginal dryness pleural bones so 4. 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 She is divorced and lives alone, with no pets. Her daughter [name] lives in [place]. She is disabled secondary to schizoaffective disorder. Daughter [name] lives in [place] and she is a Estate agent but mostly a homemaker. The patient has 3 grandchildren, the older 64, the other 2 being under 24 years old. The patient attends [employer]. Her mother passed away in 09-Mar-2017 due to breast cancer.    ADVANCED DIRECTIVES: her daughter [name] is her healthcare power of attorney. She can be reached at [place]: Social History   Tobacco Use  .  Smoking status: Former Smoker    Packs/day: 1.00    Years: 12.00    Pack years: 12.00    Types: Cigarettes    Last attempt to quit: 07/23/1981    Years since quitting: 36.6  . Smokeless tobacco: Never Used  Substance Use Topics  . Alcohol use: Yes    Alcohol/week: 0.0 oz    Comment: social occasion only- special   . Drug use: No     Colonoscopy: ; [facility]  SNK:[id number] 2016/[place]  Bone density:rJanuary 2018/normal   Lipid panel:  Allergies  Allergen Reactions  . Bee Venom   . Norco [Hydrocodone-Acetaminophen] Other (See Comments)    Pt states Norco made her extremely hyper after her surgery and she was not able to sleep.    Current Outpatient Medications  Medication Sig Dispense Refill  . dexamethasone (DECADRON) 4 MG tablet Take 2 tablets by mouth once a day on the day after chemotherapy and then take 2 tablets two times a day for 2 days. Take with food. 30 tablet 1  . FLURANDRENOLIDE EX Apply 1 application topically 2 (two) times daily as needed (for psoriasis). Cordran Tape    . folic acid (FOLVITE) 1 MG tablet Take 2 mg by mouth every evening.    . lidocaine-prilocaine (EMLA) cream Apply to affected area once 30 g 3  . LORazepam (ATIVAN) 0.5 MG tablet Take 1 tablet (0.5 mg total) by mouth at bedtime as needed and may repeat dose one time if needed (Nausea or vomiting). 30 tablet 0  . Polyethyl Glycol-Propyl Glycol (LUBRICANT EYE DROPS) 0.4-0.3 % SOLN Place 1-2 drops into both eyes 3 (three) times daily as needed (dry eyes/irritated eyes.).    Marland Kitchen prochlorperazine (COMPAZINE) 10 MG tablet Take 1 tablet (10 mg total) by mouth every 6 (six) hours as needed (Nausea or vomiting). 30 tablet 1  . traMADol (ULTRAM) 50 MG tablet Take 1 tablet (50 mg total) by mouth every 6 (six) hours as needed (mild pain). 20 tablet 0   Current Facility-Administered Medications  Medication Dose Route Frequency Provider Last Rate Last Dose  . triamcinolone acetonide (KENALOG) 10 MG/ML injection 10 mg  10  mg Other Once Wallene Huh, Samantha Sullivan        OBJECTIVE: middle-aged white woman in no acute distress  Vitals:   02/24/18 1145  BP: 139/87  Sullivan: 82  Resp: 18  Temp: 97.8 F (36.6 C)  SpO2: 100%     Body mass index is 30.37 kg/m.    ECOG FS:1 - Symptomatic but completely ambulatory  Sclerae unicteric, pupils round and equal Oropharynx clear and  moist No cervical or supraclavicular adenopathy Lungs no rales or rhonchi Heart regular rate and rhythm Abd soft, nontender, positive bowel sounds MSK no focal spinal tenderness, no upper extremity lymphedema Neuro: nonfocal, well oriented, appropriate affect Breasts: Deferred   LAB RESULTS:  CMP     Component Value Date/Time   NA 142 02/16/2018 0805   NA 141 08/05/2017 1335   K 3.9 02/16/2018 0805   K 4.5 08/05/2017 1335   CL 107 02/16/2018 0805   CO2 27 02/16/2018 0805   CO2 29 08/05/2017 1335   GLUCOSE 95 02/16/2018 0805   GLUCOSE 77 08/05/2017 1335   BUN 13 02/16/2018 0805   BUN 13.4 08/05/2017 1335   CREATININE 0.74 02/16/2018 0805   CREATININE 0.8 08/05/2017 1335   CALCIUM 9.6 02/16/2018 0805   CALCIUM 10.0 08/05/2017 1335   PROT 6.7 02/16/2018 0805   PROT 6.8 08/05/2017 1335   ALBUMIN 3.7 02/16/2018 0805   ALBUMIN 3.6 08/05/2017 1335   AST 17 02/16/2018 0805   AST 19 08/05/2017 1335   ALT 13 02/16/2018 0805   ALT 12 08/05/2017 1335   ALKPHOS 158 (H) 02/16/2018 0805   ALKPHOS 169 (H) 08/05/2017 1335   BILITOT 0.7 02/16/2018 0805   BILITOT 0.63 08/05/2017 1335   GFRNONAA >60 02/16/2018 0805   GFRAA >60 02/16/2018 0805    INo results found for: SPEP, UPEP  Lab Results  Component Value Date   WBC 5.6 02/16/2018   NEUTROABS 3.5 02/16/2018   HGB 13.1 01/05/2018   HCT 39.0 02/16/2018   MCV 90.1 02/16/2018   PLT 239 02/16/2018      Chemistry      Component Value Date/Time   NA 142 02/16/2018 0805   NA 141 08/05/2017 1335   K 3.9 02/16/2018 0805   K 4.5 08/05/2017 1335   CL 107 02/16/2018 0805   CO2  27 02/16/2018 0805   CO2 29 08/05/2017 1335   BUN 13 02/16/2018 0805   BUN 13.4 08/05/2017 1335   CREATININE 0.74 02/16/2018 0805   CREATININE 0.8 08/05/2017 1335      Component Value Date/Time   CALCIUM 9.6 02/16/2018 0805   CALCIUM 10.0 08/05/2017 1335   ALKPHOS 158 (H) 02/16/2018 0805   ALKPHOS 169 (H) 08/05/2017 1335   AST 17 02/16/2018 0805   AST 19 08/05/2017 1335   ALT 13 02/16/2018 0805   ALT 12 08/05/2017 1335   BILITOT 0.7 02/16/2018 0805   BILITOT 0.63 08/05/2017 1335       No results found for: LABCA2  No components found for: LABCA125  No results for input(s): INR in the last 168 hours.  Urinalysis    Component Value Date/Time   BILIRUBINUR neg 12/11/2015 0925   PROTEINUR neg 12/11/2015 0925   UROBILINOGEN negative 12/11/2015 0925   NITRITE neg 12/11/2015 0925   LEUKOCYTESUR Negative 12/11/2015 0925    STUDIES: No results found.  RESEARCH: enrolled in UPBEAT study; not a candidate for BR 003 because of concurrent cancer  ASSESSMENT: 67 y.o. [place] woman status post right breast upper outer quadrant biopsy 01/02/2016 for a clinical T2 N0, stage IIA invasive ductal carcinoma, grade 1, estrogen and progesterone receptor positive, HER-2 not amplified, with an MIB-1 of 10%.  (1). Right lumpectomy and sentinel lymph node sampling 01/22/2016 showed a pT1c pN0, stage IA invasive ductal carcinoma, grade 1, with close but negative margins. Repeat HER-2 testing was again not amplified   (3) Oncotype score of 18 predicts a risk of recurrence outside  the breast within the next 10 years of 11% if the patient's only systemic therapy is tamoxifen for 5 years. It also predicts no significant benefit from chemotherapy.     (4) adjuvant radiation3/14/17-4/11/17 Site/dose:   Right breast - 42.72 gy at 2.67 Gy per fraction x 16 fractions followed by an electron boost to the right breast with 10 Gy at 2 Gy per fraction x 5 fractions.    (5)  Anastrozole started  04/21/2016, discontinued after approximately 1 month with significant side effects  (a)  Bone density at [facility] 2013 was normal  (b) bone density January 2018 was normal  (c) anastrozole restarted February 2018, held again at the start of chemotherapy February 2019  (6) genetics testing  02/24/2016 through the Breast/Ovarian cancer gene panel offered by [facility] found no deleterious mutations in ATM, BARD1, BRCA1, BRCA2, BRIP1, CDH1, CHEK2, EPCAM, FANCC, MLH1, MSH2, MSH6, NBN, PALB2, PMS2, PTEN, RAD51C, RAD51D, TP53, and XRCC2.   (7) left breast biopsy 12/17/2017 showed a cT2 cN0, stage IIA- IIB invasive ductal carcinoma, grade 2, estrogen receptor weakly positive, progesterone receptor negative, with HER-2 equivocal by FISH, negative by immunohistochemistry  (8) status post bilateral mastectomies and left sentinel lymph node sampling 01/04/2018 showing  (a) on the right, no evidence of disease  (b) on the left, a pT2 pN0, stage IIB invasive ductal carcinoma, grade 3, triple negative  (9) adjuvant chemotherapy will consist of cyclophosphamide and doxorubicin in dose dense fashion x4 starting 02/16/2018 followed by weekly paclitaxel x12    PLAN: Samantha Sullivan did generally quite well with her first cycle of chemotherapy.  We are making some minor changes.  She is going to try Benadryl for sleep.  Copy Compazine to 5 mg only take it for the first 3 days on the clock, then as needed.  She is also going to try omeprazole 20 mg on days 2 through 5 of each cycle.  I think with those minor changes she will do even better with the next cycle and the previous one.  Her daughter has given her some supplements which I reviewed with her today I do not see any obvious contradictions or problems.  She will continue to follow on the fluid issue with Samantha Sullivan. Excell Sullivan, whom she sees tomorrow.  At present there is no evidence of infection.  I did not have her counts yet available today, they will be  available later, and I will call her if there is significant neutropenia in which case we would start Cipro she will return in 1 week for cycle 2 and I will see her in 2 weeks for continue follow-up  She knows to call for any other issues that may develop before the next visit.  Anhad Sheeley, Samantha Dad, Samantha Sullivan  02/24/18 12:20 PM Medical Oncology and Hematology [facility] [address] [place], [place] [address] Tel. [phone]    Fax. [phone]  This document serves as a record of services personally performed by Lurline Del, Samantha Sullivan. It was created on his behalf by Sheron Nightingale, a trained medical scribe. The creation of this record is based on the scribe's personal observations and the provider's statements to them.   I have reviewed the above documentation for accuracy and completeness, and I agree with the above.

## 2018-02-23 ENCOUNTER — Ambulatory Visit: Payer: PPO | Attending: General Surgery | Admitting: Physical Therapy

## 2018-02-23 ENCOUNTER — Encounter: Payer: Self-pay | Admitting: Physical Therapy

## 2018-02-23 DIAGNOSIS — R222 Localized swelling, mass and lump, trunk: Secondary | ICD-10-CM | POA: Diagnosis not present

## 2018-02-23 DIAGNOSIS — M25612 Stiffness of left shoulder, not elsewhere classified: Secondary | ICD-10-CM | POA: Insufficient documentation

## 2018-02-23 DIAGNOSIS — M6281 Muscle weakness (generalized): Secondary | ICD-10-CM | POA: Diagnosis not present

## 2018-02-23 DIAGNOSIS — Z483 Aftercare following surgery for neoplasm: Secondary | ICD-10-CM

## 2018-02-23 DIAGNOSIS — M79602 Pain in left arm: Secondary | ICD-10-CM | POA: Diagnosis not present

## 2018-02-23 DIAGNOSIS — M25611 Stiffness of right shoulder, not elsewhere classified: Secondary | ICD-10-CM | POA: Diagnosis not present

## 2018-02-23 NOTE — Therapy (Signed)
[place], [place], [address] Phone: [phone]   Fax:  [phone]  Physical Therapy Treatment  Patient Details  Name: Samantha Sullivan MRN: [id number] Date of Birth: [birth year] Referring Provider: Dr. Excell Seltzer    Encounter Date: 02/23/2018  PT End of Session - 02/23/18 1236    Visit Number  7    Number of Visits  9    Date for PT Re-Evaluation  02/26/18    PT Start Time  1937    PT Stop Time  1105    PT Time Calculation (min)  50 min    Activity Tolerance  Patient tolerated treatment well    Behavior During Therapy  [facility] for tasks assessed/performed       Past Medical History:  Diagnosis Date  . Abnormal CT of the chest 12/06   numeroous lung nodules - most have resolved.  . Arthritis    psoriatic arthritis- toes & feet, but resolved at this point- no need meds at this point    . Cancer [facility])    right breast cancer  . Complication of anesthesia    /w colonoscopy- woke up before procedure was complete  . Family history of adverse reaction to anesthesia    pt's mother reported difficulty being given " enough " medicine to outcome a successful anesth. experience  . Family history of leukemia   . Family history of ovarian cancer   . History of meningioma 2007   in 2013 stable on MRI and recheck in 5 years, no surgery necessry   . HSV infection 1970's- 1980's  . Psoriatic arthritis [facility])     Past Surgical History:  Procedure Laterality Date  . BILATERAL TOTAL MASTECTOMY WITH AXILLARY LYMPH NODE DISSECTION Bilateral 01/04/2018   Procedure: BILATERAL TOTAL MASTECTOMY PROPHYLATIC RIGHT  WITH LEFT SENTINEAL  LYMPH NODE;  Surgeon: Excell Seltzer, MD;  Location: [place];  Service: General;  Laterality: Bilateral;  . BREAST LUMPECTOMY WITH RADIOACTIVE SEED AND SENTINEL LYMPH NODE BIOPSY Right 01/22/2016   Procedure: RIGHT BREAST LUMPECTOMY WITH RADIOACTIVE SEED AND SENTINEL LYMPH NODE BIOPSY;   Surgeon: Excell Seltzer, MD;  Location: [place];  Service: General;  Laterality: Right;  . BREAST SURGERY Left 1971, 2016   lumpectomy benign, 2016- Br. Ca- R lumpectomy & radiation   . COLONOSCOPY  8/06   normal and recheck in 10 years  . HAMMER TOE SURGERY Left 2016  . MASTECTOMY Right 01/04/2018   PROPHYLATIC   . MASTECTOMY COMPLETE / SIMPLE W/ SENTINEL NODE BIOPSY Left 01/04/2018  . PORTACATH PLACEMENT Right 01/04/2018   Procedure: INSERTION PORT-A-CATH;  Surgeon: Excell Seltzer, MD;  Location: [place];  Service: General;  Laterality: Right;  . THYROIDECTOMY, PARTIAL Left 1983    There were no vitals filed for this visit.  Subjective Assessment - 02/23/18 1018    Subjective  Pt got her comperssion sleeve, and thinks her arm feels better. She also got a Praire comperesion vest  She says she is feelings better, but did not feel well last night. She wearing  She has chemo every other week for 4 and then every week for 12     Pertinent History  right breast cancer in 2017 with lumpectomy , sentinel node biopsy and radiation ( no chemo) left breast cancer in Dec. 26 on regular mammogram and had bilataral mastectomy with left node biospy (3 nodes ) on Jan 04 2018 drains removed 2 1/2 weeks later . has a port  on her right side and plans to have 5 months of chemo but no radiation     Patient Stated Goals  get back to the normal "me"     Currently in Pain?  No/denies         [facility] PT Assessment - 02/23/18 0001      Observation/Other Assessments   Observations  fluid on right chest appears to be lessened and full area in left axilla appears be less.  Pt reports the cording in her left arm is better ,but she still has palpable cording in her abdomin and left axilla       AROM   Right Shoulder Flexion  158 Degrees    Right Shoulder ABduction  175 Degrees    Left Shoulder Flexion  150 Degrees    Left Shoulder ABduction  163 Degrees                  [facility]  Adult PT Treatment/Exercise - 02/23/18 0001      Self-Care   Self-Care  Other Self-Care Comments    Other Self-Care Comments   pt still with praire vest, proviided thick donut shaped foam to be placed over port for pressure relief       Shoulder Exercises: Pulleys   Flexion  2 minutes    ABduction  2 minutes      Shoulder Exercises: ROM/Strengthening   Ball on Wall  up the wall x 5 with stretch at the top pt also moved ball from side to side with LE rooted down     Other ROM/Strengthening Exercises  standing at wall for bilateral scapular retraction with elbows to the back pockets       Manual Therapy   Manual Lymphatic Drainage (MLD)  with pt in supine short neck, superficial and deep abdominals. right inguinla nodes with right anteior and lateral chest below axlla and distal to port with care to avoid port, , also to sidelying for right back and more on lateral chest,  then to  left inguinal nodes, left chest, upper arm and forearm down into wrist                      Long Term Clinic Goals - 02/23/18 1238      CC Long Term Goal  #1   Title  Pt will report a decrease in pain in left arm by 50%    Period  Weeks    Status  On-going      CC Long Term Goal  #2   Baseline  goal met for right arm on 02/09/2018 , and for left arm on 02/23/2018    Status  Achieved      CC Long Term Goal  #3   Title  Pt will verbalize and understading of lymphedema risk reduction and be able to manage her post op swelling at home     Status  On-going      CC Long Term Goal  #4   Title  Pt will be independent in a home exercise program     Status  On-going         Plan - 02/23/18 1236    Clinical Impression Statement  Pt appears to be making progress with compression and exrcises and MLD. She is ready for recert next session and she is now ready to move to next phase of strengthening, but is not ready for discharge     Rehab Potential  Good  Clinical Impairments Affecting Rehab  Potential  lymph nodes removed from both axillae    PT Frequency  2x / week    PT Duration  4 weeks    PT Next Visit Plan  Reassess and send renewal  coninue with exercise and soft tissue work and , Manual lymph draiange to left arm and right chest and lateral trunk, progress A/AA/PRPM of both shoulders with strengthening, educate about ABC class  considrer kinesiotape     Consulted and Agree with Plan of Care  Patient       Patient will benefit from skilled therapeutic intervention in order to improve the following deficits and impairments:  Decreased knowledge of use of DME, Decreased skin integrity, Increased fascial restricitons, Pain, Postural dysfunction, Decreased scar mobility, Decreased range of motion, Decreased strength, Impaired UE functional use, Increased edema, Decreased knowledge of precautions  Visit Diagnosis: Aftercare following surgery for neoplasm  Stiffness of right shoulder, not elsewhere classified  Stiffness of left shoulder, not elsewhere classified  Pain in left arm  Muscle weakness (generalized)  Localized swelling, mass and lump, trunk     Problem List Patient Active Problem List   Diagnosis Date Noted  . Port-A-Cath in place 02/16/2018  . Stage I breast cancer, left ([place]) 01/04/2018  . Malignant neoplasm of overlapping sites of left breast ([place]) 12/26/2017  . Genetic testing 02/26/2016  . Family history of ovarian cancer   . Family history of leukemia   . Psoriatic arthritis ([place]) 01/15/2016  . Meningioma ([place]) 01/15/2016  . Malignant neoplasm of upper-outer quadrant of right breast in female, estrogen receptor positive ([place]) 01/04/2016   Donato Heinz. Owens Shark PT  Norwood Levo 02/23/2018, 12:39 PM  [place], [place], [address] Phone: [phone]   Fax:  [phone]  Name: Lucyann Romano MRN: [id number] Date of Birth: 1951-10-11

## 2018-02-24 ENCOUNTER — Telehealth: Payer: Self-pay | Admitting: Oncology

## 2018-02-24 ENCOUNTER — Inpatient Hospital Stay: Payer: PPO

## 2018-02-24 ENCOUNTER — Inpatient Hospital Stay: Payer: PPO | Attending: Oncology | Admitting: Oncology

## 2018-02-24 ENCOUNTER — Other Ambulatory Visit: Payer: Self-pay | Admitting: Oncology

## 2018-02-24 VITALS — BP 139/87 | HR 82 | Temp 97.8°F | Resp 18 | Ht 65.0 in | Wt 182.5 lb

## 2018-02-24 DIAGNOSIS — L658 Other specified nonscarring hair loss: Secondary | ICD-10-CM | POA: Insufficient documentation

## 2018-02-24 DIAGNOSIS — Z9013 Acquired absence of bilateral breasts and nipples: Secondary | ICD-10-CM | POA: Insufficient documentation

## 2018-02-24 DIAGNOSIS — C50411 Malignant neoplasm of upper-outer quadrant of right female breast: Secondary | ICD-10-CM | POA: Insufficient documentation

## 2018-02-24 DIAGNOSIS — C50912 Malignant neoplasm of unspecified site of left female breast: Secondary | ICD-10-CM | POA: Diagnosis not present

## 2018-02-24 DIAGNOSIS — Z87891 Personal history of nicotine dependence: Secondary | ICD-10-CM | POA: Diagnosis not present

## 2018-02-24 DIAGNOSIS — Z803 Family history of malignant neoplasm of breast: Secondary | ICD-10-CM | POA: Diagnosis not present

## 2018-02-24 DIAGNOSIS — Z9012 Acquired absence of left breast and nipple: Secondary | ICD-10-CM

## 2018-02-24 DIAGNOSIS — Z170 Estrogen receptor positive status [ER+]: Principal | ICD-10-CM

## 2018-02-24 DIAGNOSIS — Z9011 Acquired absence of right breast and nipple: Secondary | ICD-10-CM

## 2018-02-24 DIAGNOSIS — G4700 Insomnia, unspecified: Secondary | ICD-10-CM | POA: Insufficient documentation

## 2018-02-24 DIAGNOSIS — C50812 Malignant neoplasm of overlapping sites of left female breast: Secondary | ICD-10-CM

## 2018-02-24 DIAGNOSIS — Z5111 Encounter for antineoplastic chemotherapy: Secondary | ICD-10-CM | POA: Diagnosis not present

## 2018-02-24 LAB — CMP (CANCER CENTER ONLY)
ALK PHOS: 162 U/L — AB (ref 40–150)
ALT: 13 U/L (ref 0–55)
AST: 12 U/L (ref 5–34)
Albumin: 3.6 g/dL (ref 3.5–5.0)
Anion gap: 8 (ref 3–11)
BUN: 11 mg/dL (ref 7–26)
CALCIUM: 9.3 mg/dL (ref 8.4–10.4)
CHLORIDE: 100 mmol/L (ref 98–109)
CO2: 25 mmol/L (ref 22–29)
CREATININE: 0.66 mg/dL (ref 0.60–1.10)
Glucose, Bld: 92 mg/dL (ref 70–140)
Potassium: 4.2 mmol/L (ref 3.5–5.1)
Sodium: 133 mmol/L — ABNORMAL LOW (ref 136–145)
Total Bilirubin: 0.4 mg/dL (ref 0.2–1.2)
Total Protein: 6.5 g/dL (ref 6.4–8.3)

## 2018-02-24 LAB — CBC WITH DIFFERENTIAL (CANCER CENTER ONLY)
BASOS PCT: 1 %
Basophils Absolute: 0 10*3/uL (ref 0.0–0.1)
EOS ABS: 0.1 10*3/uL (ref 0.0–0.5)
EOS PCT: 7 %
HCT: 35.7 % (ref 34.8–46.6)
Hemoglobin: 12.1 g/dL (ref 11.6–15.9)
LYMPHS ABS: 0.9 10*3/uL (ref 0.9–3.3)
Lymphocytes Relative: 57 %
MCH: 30.3 pg (ref 25.1–34.0)
MCHC: 33.9 g/dL (ref 31.5–36.0)
MCV: 89.3 fL (ref 79.5–101.0)
Monocytes Absolute: 0.1 10*3/uL (ref 0.1–0.9)
Monocytes Relative: 8 %
Neutro Abs: 0.5 10*3/uL — CL (ref 1.5–6.5)
Neutrophils Relative %: 27 %
PLATELETS: 171 10*3/uL (ref 145–400)
RBC: 4 MIL/uL (ref 3.70–5.45)
RDW: 12.7 % (ref 11.2–14.5)
WBC: 1.7 10*3/uL — AB (ref 3.9–10.3)

## 2018-02-24 NOTE — Progress Notes (Unsigned)
I called Samantha Sullivan with the results of her neutrophils,.  We decided not to do antibiotics but she will call if she has a temperature above 100 and she will be extra careful with her infection prevention.

## 2018-02-24 NOTE — Telephone Encounter (Signed)
Gave patient AVS and calendar of upcoming march and April appointments.

## 2018-03-02 ENCOUNTER — Inpatient Hospital Stay: Payer: PPO

## 2018-03-02 ENCOUNTER — Other Ambulatory Visit: Payer: PPO

## 2018-03-02 VITALS — BP 134/67 | HR 82 | Temp 98.4°F | Resp 18

## 2018-03-02 DIAGNOSIS — C50812 Malignant neoplasm of overlapping sites of left female breast: Secondary | ICD-10-CM

## 2018-03-02 DIAGNOSIS — C50411 Malignant neoplasm of upper-outer quadrant of right female breast: Secondary | ICD-10-CM

## 2018-03-02 DIAGNOSIS — Z170 Estrogen receptor positive status [ER+]: Principal | ICD-10-CM

## 2018-03-02 DIAGNOSIS — Z95828 Presence of other vascular implants and grafts: Secondary | ICD-10-CM

## 2018-03-02 DIAGNOSIS — Z5111 Encounter for antineoplastic chemotherapy: Secondary | ICD-10-CM | POA: Diagnosis not present

## 2018-03-02 LAB — CBC WITH DIFFERENTIAL (CANCER CENTER ONLY)
BASOS PCT: 1 %
Basophils Absolute: 0 10*3/uL (ref 0.0–0.1)
EOS ABS: 0 10*3/uL (ref 0.0–0.5)
Eosinophils Relative: 0 %
HEMATOCRIT: 35.6 % (ref 34.8–46.6)
HEMOGLOBIN: 11.9 g/dL (ref 11.6–15.9)
Lymphocytes Relative: 28 %
Lymphs Abs: 1.4 10*3/uL (ref 0.9–3.3)
MCH: 30.4 pg (ref 25.1–34.0)
MCHC: 33.4 g/dL (ref 31.5–36.0)
MCV: 90.8 fL (ref 79.5–101.0)
MONOS PCT: 12 %
Monocytes Absolute: 0.6 10*3/uL (ref 0.1–0.9)
NEUTROS ABS: 3.1 10*3/uL (ref 1.5–6.5)
NEUTROS PCT: 59 %
Platelet Count: 256 10*3/uL (ref 145–400)
RBC: 3.92 MIL/uL (ref 3.70–5.45)
RDW: 13.5 % (ref 11.2–14.5)
WBC Count: 5.2 10*3/uL (ref 3.9–10.3)

## 2018-03-02 MED ORDER — SODIUM CHLORIDE 0.9 % IV SOLN
Freq: Once | INTRAVENOUS | Status: AC
  Administered 2018-03-02: 09:00:00 via INTRAVENOUS

## 2018-03-02 MED ORDER — DOXORUBICIN HCL CHEMO IV INJECTION 2 MG/ML
60.0000 mg/m2 | Freq: Once | INTRAVENOUS | Status: AC
  Administered 2018-03-02: 118 mg via INTRAVENOUS
  Filled 2018-03-02: qty 59

## 2018-03-02 MED ORDER — SODIUM CHLORIDE 0.9% FLUSH
10.0000 mL | INTRAVENOUS | Status: DC | PRN
  Administered 2018-03-02: 10 mL
  Filled 2018-03-02: qty 10

## 2018-03-02 MED ORDER — PROCHLORPERAZINE MALEATE 10 MG PO TABS
10.0000 mg | ORAL_TABLET | Freq: Four times a day (QID) | ORAL | Status: DC | PRN
  Administered 2018-03-02: 10 mg via ORAL

## 2018-03-02 MED ORDER — SODIUM CHLORIDE 0.9% FLUSH
10.0000 mL | INTRAVENOUS | Status: DC | PRN
  Filled 2018-03-02: qty 10

## 2018-03-02 MED ORDER — PROCHLORPERAZINE MALEATE 10 MG PO TABS
ORAL_TABLET | ORAL | Status: AC
  Filled 2018-03-02: qty 1

## 2018-03-02 MED ORDER — PEGFILGRASTIM 6 MG/0.6ML ~~LOC~~ PSKT
6.0000 mg | PREFILLED_SYRINGE | Freq: Once | SUBCUTANEOUS | Status: AC
  Administered 2018-03-02: 6 mg via SUBCUTANEOUS

## 2018-03-02 MED ORDER — PEGFILGRASTIM 6 MG/0.6ML ~~LOC~~ PSKT
PREFILLED_SYRINGE | SUBCUTANEOUS | Status: AC
  Filled 2018-03-02: qty 0.6

## 2018-03-02 MED ORDER — SODIUM CHLORIDE 0.9 % IV SOLN
Freq: Once | INTRAVENOUS | Status: AC
  Administered 2018-03-02: 10:00:00 via INTRAVENOUS
  Filled 2018-03-02: qty 5

## 2018-03-02 MED ORDER — PALONOSETRON HCL INJECTION 0.25 MG/5ML
INTRAVENOUS | Status: AC
  Filled 2018-03-02: qty 5

## 2018-03-02 MED ORDER — SODIUM CHLORIDE 0.9 % IV SOLN
600.0000 mg/m2 | Freq: Once | INTRAVENOUS | Status: AC
  Administered 2018-03-02: 1180 mg via INTRAVENOUS
  Filled 2018-03-02: qty 59

## 2018-03-02 MED ORDER — HEPARIN SOD (PORK) LOCK FLUSH 100 UNIT/ML IV SOLN
500.0000 [IU] | Freq: Once | INTRAVENOUS | Status: DC | PRN
  Filled 2018-03-02: qty 5

## 2018-03-02 MED ORDER — PALONOSETRON HCL INJECTION 0.25 MG/5ML: 0.2500 mg | Freq: Once | INTRAVENOUS | Status: DC

## 2018-03-02 NOTE — Progress Notes (Signed)
Patient with Headache post cycle #1, informed MD who instructed to discontinue Aloxi and give prochlorperazine 10 mg po prior to chemo.  Informed nurse Prentice Docker and asked her inform patient of the change and to let us know if she experiences any nausea/vomiting and/or continuation of headache.  T.O.  Dr Magrinat/CJones   Henreitta Leber, PharmD 03/02/18 @ [phone]

## 2018-03-02 NOTE — Patient Instructions (Signed)
[facility] Discharge Instructions for Patients Receiving Chemotherapy  Today you received the following chemotherapy agents: Doxorubicin (Adriamycin) and Cyclophosphamide (Cytoxan).  To help prevent nausea and vomiting after your treatment, we encourage you to take your nausea medication as prescribed. Received Aloxi during treatment-->take Compazine (not Zofran) for the next 3 days.  If you develop nausea and vomiting that is not controlled by your nausea medication, call the clinic.   BELOW ARE SYMPTOMS THAT SHOULD BE REPORTED IMMEDIATELY:  *FEVER GREATER THAN 100.5 F  *CHILLS WITH OR WITHOUT FEVER  NAUSEA AND VOMITING THAT IS NOT CONTROLLED WITH YOUR NAUSEA MEDICATION  *UNUSUAL SHORTNESS OF BREATH  *UNUSUAL BRUISING OR BLEEDING  TENDERNESS IN MOUTH AND THROAT WITH OR WITHOUT PRESENCE OF ULCERS  *URINARY PROBLEMS  *BOWEL PROBLEMS  UNUSUAL RASH Items with * indicate a potential emergency and should be followed up as soon as possible.  Feel free to call the clinic should you have any questions or concerns. The clinic phone number is [phone].  Please show the [facility] at check-in to the Emergency Department and triage nurse.

## 2018-03-04 ENCOUNTER — Ambulatory Visit: Payer: PPO | Admitting: Physical Therapy

## 2018-03-04 DIAGNOSIS — M6281 Muscle weakness (generalized): Secondary | ICD-10-CM

## 2018-03-04 DIAGNOSIS — M79602 Pain in left arm: Secondary | ICD-10-CM

## 2018-03-04 DIAGNOSIS — Z483 Aftercare following surgery for neoplasm: Secondary | ICD-10-CM

## 2018-03-04 DIAGNOSIS — M25612 Stiffness of left shoulder, not elsewhere classified: Secondary | ICD-10-CM

## 2018-03-04 DIAGNOSIS — R222 Localized swelling, mass and lump, trunk: Secondary | ICD-10-CM

## 2018-03-04 DIAGNOSIS — M25611 Stiffness of right shoulder, not elsewhere classified: Secondary | ICD-10-CM

## 2018-03-04 NOTE — Patient Instructions (Signed)

## 2018-03-04 NOTE — Therapy (Signed)
[place], [place], [address] Phone: [phone]   Fax:  [phone]  Physical Therapy Treatment  Patient Details  Name: Samantha Sullivan MRN: [id number] Date of Birth: 03-28-1951 Referring Provider: Dr. Aurelio Jew    Encounter Date: 03/04/2018  PT End of Session - 03/04/18 1723    Visit Number  8    Number of Visits  25    Date for PT Re-Evaluation  05/04/18    PT Start Time  2841    PT Stop Time  1600    PT Time Calculation (min)  45 min    Activity Tolerance  Patient tolerated treatment well    Behavior During Therapy  [facility] for tasks assessed/performed       Past Medical History:  Diagnosis Date  . Abnormal CT of the chest 12/06   numeroous lung nodules - most have resolved.  . Arthritis    psoriatic arthritis- toes & feet, but resolved at this point- no need meds at this point    . Cancer [facility])    right breast cancer  . Complication of anesthesia    /w colonoscopy- woke up before procedure was complete  . Family history of adverse reaction to anesthesia    pt's mother reported difficulty being given " enough " medicine to outcome a successful anesth. experience  . Family history of leukemia   . Family history of ovarian cancer   . History of meningioma 2007   in 2013 stable on MRI and recheck in 5 years, no surgery necessry   . HSV infection 1970's- 1980's  . Psoriatic arthritis [facility])     Past Surgical History:  Procedure Laterality Date  . BILATERAL TOTAL MASTECTOMY WITH AXILLARY LYMPH NODE DISSECTION Bilateral 01/04/2018   Procedure: BILATERAL TOTAL MASTECTOMY PROPHYLATIC RIGHT  WITH LEFT SENTINEAL  LYMPH NODE;  Surgeon: Excell Seltzer, MD;  Location: [place];  Service: General;  Laterality: Bilateral;  . BREAST LUMPECTOMY WITH RADIOACTIVE SEED AND SENTINEL LYMPH NODE BIOPSY Right 01/22/2016   Procedure: RIGHT BREAST LUMPECTOMY WITH RADIOACTIVE SEED AND SENTINEL LYMPH NODE BIOPSY;   Surgeon: Excell Seltzer, MD;  Location: [place];  Service: General;  Laterality: Right;  . BREAST SURGERY Left 1971, 2016   lumpectomy benign, 2016- Br. Ca- R lumpectomy & radiation   . COLONOSCOPY  8/06   normal and recheck in 10 years  . HAMMER TOE SURGERY Left 2016  . MASTECTOMY Right 01/04/2018   PROPHYLATIC   . MASTECTOMY COMPLETE / SIMPLE W/ SENTINEL NODE BIOPSY Left 01/04/2018  . PORTACATH PLACEMENT Right 01/04/2018   Procedure: INSERTION PORT-A-CATH;  Surgeon: Excell Seltzer, MD;  Location: [place];  Service: General;  Laterality: Right;  . THYROIDECTOMY, PARTIAL Left 1983    There were no vitals filed for this visit.  Subjective Assessment - 03/04/18 1657    Subjective  Pt went to see Dr. Excell Seltzer last week and has 40 cc drained from her right chest. She also has had a firm "lump" come up on her left mid upper arm near an area of cording that is sometimes painful.  She continues to have cording in her abdomen.     Pertinent History  right breast cancer in 2017 with lumpectomy , sentinel node biopsy and radiation ( no chemo) left breast cancer in Dec. 26 on regular mammogram and had bilataral mastectomy with left node biospy (3 nodes ) on Jan 04 2018 drains removed 2 1/2 weeks later . has a  port on her right side and plans to have 5 months of chemo but no radiation     Patient Stated Goals  get back to the normal "me"     Currently in Pain?  Yes    Pain Score  -- did not rate     Pain Orientation  Left    Pain Descriptors / Indicators  Tightness    Pain Type  Acute pain    Pain Radiating Towards  down toward left arm     Pain Onset  In the past 7 days    Pain Frequency  Intermittent    Aggravating Factors   stretching or reaching     Pain Relieving Factors  rest, wearing compression sleeve    Effect of Pain on Daily Activities  limits household activities          [facility] PT Assessment - 03/04/18 0001      Assessment   Medical Diagnosis  left breast  cancer     Referring Provider  Dr. Aurelio Jew     Onset Date/Surgical Date  01/04/18      Prior Function   Level of Independence  Independent                  [facility] Adult PT Treatment/Exercise - 03/04/18 0001      Self-Care   Self-Care  Other Self-Care Comments    Other Self-Care Comments   peach small dot foam on while foam patch to right lateral chest  and left area at axilla.  alos gave pt foam patch for around port  to keep it from being compressed by compression bra       Shoulder Exercises: Supine   Protraction  Strengthening;Right;Left;10 reps    Horizontal ABduction  Strengthening;Right;Left;5 reps;Theraband    Theraband Level (Shoulder Horizontal ABduction)  Level 1 (Yellow)    External Rotation  Strengthening;Right;Left;5 reps;Theraband    Theraband Level (Shoulder External Rotation)  Level 1 (Yellow)    Flexion  Strengthening;Right;Left;5 reps;Theraband narrow and wide grip     Theraband Level (Shoulder Flexion)  Level 1 (Yellow)    Other Supine Exercises  diagonal elevation with yellow theraband 5 reps with each arm       Shoulder Exercises: Seated   Other Seated Exercises  sitting with both hands at sides on towel rolls and "push up" to activate core and serratus muscles       Shoulder Exercises: Pulleys   Flexion  2 minutes    ABduction  2 minutes      Shoulder Exercises: ROM/Strengthening   Ball on Wall  up the wall x 5 with stretch at the top pt also moved ball from side to side with LE rooted down       Manual Therapy   Kinesiotex  Edema      Kinesiotix   Edema  skin kote to left arm and then 2 pieces of 4 pronged kinesiotape from forearm to posterior  upper arm and from posterior axilla to area of coreing on medial upper arm                      Long Term Clinic Goals - 03/04/18 1522      CC Long Term Goal  #1   Title  Pt will report a decrease in pain in left arm by 50%    Baseline  pt continues to have pain in left arm and  abdomen from cording but it is  less than 50%    Status  Achieved      CC Long Term Goal  #2   Title  Pt will have increased abduction of both shoulder to 140 degrees so that she can perform self care and household chores easier at home     Baseline  goal met for right arm on 02/09/2018 , and for left arm on 02/23/2018    Status  Achieved      CC Long Term Goal  #3   Title  Pt will verbalize and understading of lymphedema risk reduction and be able to manage her post op swelling at home     Baseline  has compression sleeve and bra     Status  Achieved      CC Long Term Goal  #4   Title  Pt will be independent in a home exercise program     Baseline  needs to be updated to strengthening     Status  On-going      CC Long Term Goal  #5   Title  Pt will have reduction in cording and abdomen by 50%    Time  4    Period  Weeks    Status  New         Plan - 03/04/18 1724    Clinical Impression Statement  Pt has had reduction in fullness of right chest after aspiration, but still has some firmness in lateral aspect with fullness below port and contraction at lateral aspect of incision. She continues with cording in left UE with firm "knot" just above elbow.  Added more kinesiotape to this arm to wear under her sleeve to see if it will help this resolve.  Pt has upgraded to strengthening today and did well with initial exercise.  Anticipate she will need prolonged therapy as her fascial restrictions are persistent. Renewal sent today for 8 more weeks although she may not need all those sessions     Rehab Potential  Good    Clinical Impairments Affecting Rehab Potential  lymph nodes removed from both axillae    PT Frequency  2x / week    PT Duration  4 weeks    PT Next Visit Plan  Progress strengthening exercise with [place] and scapualar work continue soft tissue work and manual lymph draiange to left arm and right chest and lateral trunk and cording at abdomen  continue  kinesiotape     PT  Home Exercise Plan  supine dowel flexion and abduction , supince scapular series, supine protraciton     Consulted and Agree with Plan of Care  Patient       Patient will benefit from skilled therapeutic intervention in order to improve the following deficits and impairments:  Decreased knowledge of use of DME, Decreased skin integrity, Increased fascial restricitons, Pain, Postural dysfunction, Decreased scar mobility, Decreased range of motion, Decreased strength, Impaired UE functional use, Increased edema, Decreased knowledge of precautions  Visit Diagnosis: Aftercare following surgery for neoplasm - Plan: PT plan of care cert/re-cert  Stiffness of right shoulder, not elsewhere classified - Plan: PT plan of care cert/re-cert  Stiffness of left shoulder, not elsewhere classified - Plan: PT plan of care cert/re-cert  Pain in left arm - Plan: PT plan of care cert/re-cert  Muscle weakness (generalized) - Plan: PT plan of care cert/re-cert  Localized swelling, mass and lump, trunk - Plan: PT plan of care cert/re-cert     Problem List Patient Active Problem List  Diagnosis Date Noted  . Port-A-Cath in place 02/16/2018  . Stage I breast cancer, left ([place]) 01/04/2018  . Malignant neoplasm of overlapping sites of left breast ([place]) 12/26/2017  . Genetic testing 02/26/2016  . Family history of ovarian cancer   . Family history of leukemia   . Psoriatic arthritis ([place]) 01/15/2016  . Meningioma ([place]) 01/15/2016  . Malignant neoplasm of upper-outer quadrant of right breast in female, estrogen receptor positive ([place]) 01/04/2016   Donato Heinz. Owens Shark PT  Norwood Levo 03/04/2018, 5:32 PM  [place], [place], [address] Phone: [phone]   Fax:  [phone]  Name: Samantha Sullivan MRN: [id number] Date of Birth: 30-Jan-1951

## 2018-03-09 ENCOUNTER — Encounter: Payer: PPO | Admitting: Physical Therapy

## 2018-03-09 NOTE — Progress Notes (Signed)
Samantha Sullivan  [phone] [phone]     ID: [name] DOB: [birth year]  MR#: [id number]  ZDG#:[id number]  Patient Care Team: Shon Baton, MD as PCP - General (Internal Medicine) , Virgie Dad, MD as Consulting Physician (Oncology) Kem Boroughs, [place] as Nurse Practitioner (Family Medicine) Regal, Tamala Fothergill, DPM as Consulting Physician (Podiatry) Excell Seltzer, MD as Consulting Physician (General Surgery) Force, Shari Prows, DO as Referring Physician (Student) PCP: Shon Baton, MD OTHER MD:  CHIEF COMPLAINT: Contralateral breast cancer, triple negative  CURRENT TREATMENT: Adjuvant chemotherapy  INTERVAL HISTORY: Samantha Sullivan returns today for follow-up of her triple negative breast cancer. She received cycle 1 of cyclophosphamide and doxorubicin on 02/16/2018, today being day 9 from cycle 2 of 4 planned cycles, to be followed by weekly paclitaxel/carboplatin x12. She notes that she did well with cycle 2 thus far with the compazine decreased in half, no zofran, as well as prilosec to aid with her acid reflux. She has had issues with sleep the 1st 3 nights following her cycle. She takes the ativan at night and then when she wakes up in the middle of the night, she would take another dose. She reports that this has alleviated her symptoms. She had mild nausea, but nothing that wasn't tolerable. She still takes the decadron as prescribed to her.   She continues to receive Neulasta (OnPro), with generally good tolerance.      REVIEW OF SYSTEMS: Samantha Sullivan reports that for exercise, she completed a lot of activities recently with family members, which she enjoyed. She reports that she will need a refill of her ativan at this time. She is staying on top of her bowel movements to prevent constipation. She denies mouth sores and notes that she is consuming ice or freeze pops. She also has sores to her bilateral finger tips as well as bruising to her forearm and  posterior lower extremities. She denies visional issues at this time. She notes that she has an area to her right chest wall that is sore at this time. She denies unusual headaches, visual changes, nausea, vomiting, or dizziness. There has been no unusual cough, phlegm production, or pleurisy. This been no change in bowel or bladder habits. She denies unexplained fatigue or unexplained weight loss, bleeding, rash, or fever. A detailed review of systems was otherwise stable.     RIGHT BREAST CANCER HISTORY:  From the original intake note:  "[name]"noted a lump in her right breast sometime in late October or early November, but there were "so many things going on in her life" she did not bring it to medical attention until she saw [name] 12/11/2015. Ms Samantha Sullivan was able to palpate a mass at the 9:30 o'clock position in the right breast, which was mobile and nontender. The left breast is status post prior remote biopsy and there was some scar tissue at the 7:00 position. The patient was set up for bilateral diagnostic Mammography with tomosynthesis at [facility] 12/27/2015. This found the breast density to be category C. There was a new oval mass in the right breast upper outer quadrant. Ultrasound the same day confirmed a 2.2 cm mass with irregular margins in the right breast at the 9:00 position read as highly suggestive of malignancy.  Biopsy of the right breast mass in question 01/02/2016 found ([id number]) an invasive ductal carcinoma, grade 1, estrogen receptor 100% positive, progesterone receptor 90% positive, both with strong staining intensity, with an MIB-1 of 10%, and no HER-2 amplification, the  signals ratio being 1.32 and the number per cell 2.05. The patient was informed and instructed to stop hormone replacement therapy (last dose 01/04/2016).  Her subsequent history is as detailed below.    PAST MEDICAL HISTORY: Past Medical History:  Diagnosis Date  . Abnormal CT of the chest 12/06    numeroous lung nodules - most have resolved.  . Arthritis    psoriatic arthritis- toes & feet, but resolved at this point- no need meds at this point    . Cancer [facility])    right breast cancer  . Complication of anesthesia    /w colonoscopy- woke up before procedure was complete  . Family history of adverse reaction to anesthesia    pt's mother reported difficulty being given " enough " medicine to outcome a successful anesth. experience  . Family history of leukemia   . Family history of ovarian cancer   . History of meningioma 2007   in 2013 stable on MRI and recheck in 5 years, no surgery necessry   . HSV infection 1970's- 1980's  . Psoriatic arthritis ([place])     PAST SURGICAL HISTORY: Past Surgical History:  Procedure Laterality Date  . BILATERAL TOTAL MASTECTOMY WITH AXILLARY LYMPH NODE DISSECTION Bilateral 01/04/2018   Procedure: BILATERAL TOTAL MASTECTOMY PROPHYLATIC RIGHT  WITH LEFT SENTINEAL  LYMPH NODE;  Surgeon: Excell Seltzer, MD;  Location: [place];  Service: General;  Laterality: Bilateral;  . BREAST LUMPECTOMY WITH RADIOACTIVE SEED AND SENTINEL LYMPH NODE BIOPSY Right 01/22/2016   Procedure: RIGHT BREAST LUMPECTOMY WITH RADIOACTIVE SEED AND SENTINEL LYMPH NODE BIOPSY;  Surgeon: Excell Seltzer, MD;  Location: [place];  Service: General;  Laterality: Right;  . BREAST SURGERY Left 1971, 2016   lumpectomy benign, 2016- Br. Ca- R lumpectomy & radiation   . COLONOSCOPY  8/06   normal and recheck in 10 years  . HAMMER TOE SURGERY Left 2016  . MASTECTOMY Right 01/04/2018   PROPHYLATIC   . MASTECTOMY COMPLETE / SIMPLE W/ SENTINEL NODE BIOPSY Left 01/04/2018  . PORTACATH PLACEMENT Right 01/04/2018   Procedure: INSERTION PORT-A-CATH;  Surgeon: Excell Seltzer, MD;  Location: [place];  Service: General;  Laterality: Right;  . THYROIDECTOMY, PARTIAL Left 1983    FAMILY HISTORY Family History  Problem Relation Age of Onset  . Osteoporosis Mother   .  Osteoporosis Sister   . Lung cancer Maternal Aunt        smoker  . Heart Problems Maternal Grandmother   . COPD Maternal Grandfather   . Neurodegenerative disease Maternal Aunt        corical-ganglion degeneration  . Ovarian cancer Maternal Aunt        dx in her 72s  . Leukemia Cousin        40s-40s; maternal cousin  [name] has essentially no information on her father's side of the family aside from the fact that he died in his 37s from heart disease. He had psoriasis. The patient's mother passed away 04/11/2017 age 35, due to breast cancer. The patient had 2 brothers who died from congenital heart disease shortly after birth. The patient has one sister. The patient's mother had one sister, the patient's aunt, diagnosed with ovarian cancer in her 88s.  GYNECOLOGIC HISTORY:  Patient's last menstrual period was 03/22/2000 (approximate). Menarche age 70, first live birth age 50. The patient is GX P2. She went through menopause approximately 1990 but was taking hormone replacement until 01/04/2016. Since coming off replacement she has had insomnia  but no hot flashes or vaginal dryness pleural bones so 4. 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 She is divorced and lives alone, with no pets. Her daughter [name] lives in [place]. She is disabled secondary to schizoaffective disorder. Daughter [name] lives in [place] and she is a Estate agent but mostly a homemaker. The patient has 3 grandchildren, the older 8, the other 2 being under 74 years old. The patient attends [employer]. Her mother passed away in 03-29-2017 due to breast cancer.    ADVANCED DIRECTIVES: her daughter [name] is her healthcare power of attorney. She can be reached at [place]: Social History    Tobacco Use  . Smoking status: Former Smoker    Packs/day: 1.00    Years: 12.00    Pack years: 12.00    Types: Cigarettes    Last attempt to quit: 07/23/1981    Years since quitting: 36.6  . Smokeless tobacco: Never Used  Substance Use Topics  . Alcohol use: Yes    Alcohol/week: 0.0 oz    Comment: social occasion only- special   . Drug use: No     Colonoscopy: ; [facility]  MBW:GYKZLDJ 2016/[place]  Bone density:rJanuary 2018/normal   Lipid panel:  Allergies  Allergen Reactions  . Bee Venom   . Norco [Hydrocodone-Acetaminophen] Other (See Comments)    Pt states Norco made her extremely hyper after her surgery and she was not able to sleep.    Current Outpatient Medications  Medication Sig Dispense Refill  . dexamethasone (DECADRON) 4 MG tablet Take 2 tablets by mouth once a day on the day after chemotherapy and then take 2 tablets two times a day for 2 days. Take with food. 30 tablet 1  . FLURANDRENOLIDE EX Apply 1 application topically 2 (two) times daily as needed (for psoriasis). Cordran Tape    . folic acid (FOLVITE) 1 MG tablet Take 2 mg by mouth every evening.    . lidocaine-prilocaine (EMLA) cream Apply to affected area once 30 g 3  . LORazepam (ATIVAN) 0.5 MG tablet Take 1 tablet (0.5 mg total) by mouth at bedtime as needed and may repeat dose one time if needed (Nausea or vomiting). 30 tablet 0  . Polyethyl Glycol-Propyl Glycol (LUBRICANT EYE DROPS) 0.4-0.3 % SOLN Place 1-2 drops into both eyes 3 (three) times daily as needed (dry eyes/irritated eyes.).    Marland Kitchen prochlorperazine (COMPAZINE) 10 MG tablet Take 1 tablet (10 mg total) by mouth every 6 (six) hours as needed (Nausea or vomiting). 30 tablet 1  . traMADol (ULTRAM) 50 MG tablet Take 1 tablet (50 mg total) by mouth every 6 (six) hours as needed (mild pain). 20 tablet 0   Current Facility-Administered Medications  Medication Dose Route Frequency Provider Last Rate Last Dose  . triamcinolone acetonide (KENALOG) 10 MG/ML  injection 10 mg  10 mg Other Once Wallene Huh, DPM        OBJECTIVE: middle-aged white woman who appears well  Vitals:   03/10/18 1413  BP: 139/89  Sullivan: 86  Resp: 18  Temp: (!) 97.5 F (36.4 C)  SpO2: 100%     Body mass index is 30.04 kg/m.    ECOG FS:1 - Symptomatic but completely ambulatory  She is now glabrous Sclerae unicteric, EOMs intact Oropharynx clear and moist No cervical or  supraclavicular adenopathy Lungs no rales or rhonchi Heart regular rate and rhythm Abd soft, nontender, positive bowel sounds MSK no focal spinal tenderness, no upper extremity lymphedema Neuro: nonfocal, well oriented, appropriate affect Breasts: Status post bilateral mastectomies.  There is no evidence of chest wall recurrence.  In the left axilla there is a firm movable 3 x 1 cm nodule which is her "cord" and is being managed through physical therapy.   LAB RESULTS:  CMP     Component Value Date/Time   NA 133 (L) 02/24/2018 1200   NA 141 08/05/2017 1335   K 4.2 02/24/2018 1200   K 4.5 08/05/2017 1335   CL 100 02/24/2018 1200   CO2 25 02/24/2018 1200   CO2 29 08/05/2017 1335   GLUCOSE 92 02/24/2018 1200   GLUCOSE 77 08/05/2017 1335   BUN 11 02/24/2018 1200   BUN 13.4 08/05/2017 1335   CREATININE 0.66 02/24/2018 1200   CREATININE 0.8 08/05/2017 1335   CALCIUM 9.3 02/24/2018 1200   CALCIUM 10.0 08/05/2017 1335   PROT 6.5 02/24/2018 1200   PROT 6.8 08/05/2017 1335   ALBUMIN 3.6 02/24/2018 1200   ALBUMIN 3.6 08/05/2017 1335   AST 12 02/24/2018 1200   AST 19 08/05/2017 1335   ALT 13 02/24/2018 1200   ALT 12 08/05/2017 1335   ALKPHOS 162 (H) 02/24/2018 1200   ALKPHOS 169 (H) 08/05/2017 1335   BILITOT 0.4 02/24/2018 1200   BILITOT 0.63 08/05/2017 1335   GFRNONAA >60 02/24/2018 1200   GFRAA >60 02/24/2018 1200    INo results found for: SPEP, UPEP  Lab Results  Component Value Date   WBC 5.2 03/02/2018   NEUTROABS 3.1 03/02/2018   HGB 13.1 01/05/2018   HCT 35.6  03/02/2018   MCV 90.8 03/02/2018   PLT 256 03/02/2018      Chemistry      Component Value Date/Time   NA 133 (L) 02/24/2018 1200   NA 141 08/05/2017 1335   K 4.2 02/24/2018 1200   K 4.5 08/05/2017 1335   CL 100 02/24/2018 1200   CO2 25 02/24/2018 1200   CO2 29 08/05/2017 1335   BUN 11 02/24/2018 1200   BUN 13.4 08/05/2017 1335   CREATININE 0.66 02/24/2018 1200   CREATININE 0.8 08/05/2017 1335      Component Value Date/Time   CALCIUM 9.3 02/24/2018 1200   CALCIUM 10.0 08/05/2017 1335   ALKPHOS 162 (H) 02/24/2018 1200   ALKPHOS 169 (H) 08/05/2017 1335   AST 12 02/24/2018 1200   AST 19 08/05/2017 1335   ALT 13 02/24/2018 1200   ALT 12 08/05/2017 1335   BILITOT 0.4 02/24/2018 1200   BILITOT 0.63 08/05/2017 1335       No results found for: LABCA2  No components found for: LABCA125  No results for input(s): INR in the last 168 hours.  Urinalysis    Component Value Date/Time   BILIRUBINUR neg 12/11/2015 0925   PROTEINUR neg 12/11/2015 0925   UROBILINOGEN negative 12/11/2015 0925   NITRITE neg 12/11/2015 0925   LEUKOCYTESUR Negative 12/11/2015 0925    STUDIES: No results found.  RESEARCH: enrolled in UPBEAT study; not a candidate for BR 003 because of concurrent cancer  ASSESSMENT: 67 y.o. [place] woman status post right breast upper outer quadrant biopsy 01/02/2016 for a clinical T2 N0, stage IIA invasive ductal carcinoma, grade 1, estrogen and progesterone receptor positive, HER-2 not amplified, with an MIB-1 of 10%.  (1). Right lumpectomy and sentinel lymph node sampling 01/22/2016  showed a pT1c pN0, stage IA invasive ductal carcinoma, grade 1, with close but negative margins. Repeat HER-2 testing was again not amplified   (3) Oncotype score of 18 predicts a risk of recurrence outside the breast within the next 10 years of 11% if the patient's only systemic therapy is tamoxifen for 5 years. It also predicts no significant benefit from chemotherapy.     (4)  adjuvant radiation3/14/17-4/11/17 Site/dose:   Right breast - 42.72 gy at 2.67 Gy per fraction x 16 fractions followed by an electron boost to the right breast with 10 Gy at 2 Gy per fraction x 5 fractions.    (5)  Anastrozole started 04/21/2016, discontinued after approximately 1 month with significant side effects  (a)  [facility] was normal  (b) bone density January 2018 was normal  (c) anastrozole restarted February 2018, held again at the start of chemotherapy February 2019  (6) genetics testing  02/24/2016 through the Breast/Ovarian cancer gene panel offered by [facility] found no deleterious mutations in ATM, BARD1, BRCA1, BRCA2, BRIP1, CDH1, CHEK2, EPCAM, FANCC, MLH1, MSH2, MSH6, NBN, PALB2, PMS2, PTEN, RAD51C, RAD51D, TP53, and XRCC2.   (7) left breast biopsy 12/17/2017 showed a cT2 cN0, stage IIA- IIB invasive ductal carcinoma, grade 2, estrogen receptor weakly positive, progesterone receptor negative, with HER-2 equivocal by FISH, negative by immunohistochemistry  (8) status post bilateral mastectomies and left sentinel lymph node sampling 01/04/2018 showing  (a) on the right, no evidence of disease  (b) on the left, a pT2 pN0, stage IIB invasive ductal carcinoma, grade 3, triple negative  (9) adjuvant chemotherapy will consist of cyclophosphamide and doxorubicin in dose dense fashion x4 starting 02/16/2018 followed by weekly paclitaxel /carboplatin x12    PLAN: Samantha Sullivan did much better with the second cycle, chiefly by eliminating palonosetron.  We also dropped the Compazine dose to 5 mg.  She also is managing the constipation issue much better, staying ahead of it.  She does have insomnia problems.  These are not new.  Possibly they are a little bit worse.  Part of it may be the dexamethasone.  Were going to drop the next dexamethasone dose to 4 mg twice daily.  Of course if she has any nausea despite that she will go back to the 8 mg dose as  before.  Her ANC today is down to 300.  She is going to be on ciprofloxacin twice daily for the next 5 days and she will call us if she develops a fever.  She brought me the path report from her needle core biopsy January 02, 2016 and December 17, 2017.  This was their reading from our slides.  They confirmed the readings given above  We commiserated over her hair loss.  I reassured her that it will grow back.  We also discussed carboplatin to be given together with Taxol after she completes the current chemo; we discussed the possible toxicity side effects and complications and she received written information on that drug  Knows to call for any other issues that may develop before the next visit. , Virgie Dad, MD  03/10/18 2:27 PM Medical Oncology and Hematology [facility] [address] [place], [place] [address] Tel. [phone]    Fax. [phone]    This document serves as a record of services personally performed by Lurline Del, MD. It was created on his behalf by Steva Colder, a trained medical scribe. The creation of this record is based  on the scribe's personal observations and the provider's statements to them.   I have reviewed the above documentation for accuracy and completeness, and I agree with the above.

## 2018-03-10 ENCOUNTER — Inpatient Hospital Stay: Payer: PPO

## 2018-03-10 ENCOUNTER — Telehealth: Payer: Self-pay | Admitting: Oncology

## 2018-03-10 ENCOUNTER — Inpatient Hospital Stay (HOSPITAL_BASED_OUTPATIENT_CLINIC_OR_DEPARTMENT_OTHER): Payer: PPO | Admitting: Oncology

## 2018-03-10 VITALS — BP 139/89 | HR 86 | Temp 97.5°F | Resp 18 | Ht 65.0 in | Wt 180.5 lb

## 2018-03-10 DIAGNOSIS — C50411 Malignant neoplasm of upper-outer quadrant of right female breast: Secondary | ICD-10-CM | POA: Diagnosis not present

## 2018-03-10 DIAGNOSIS — C50912 Malignant neoplasm of unspecified site of left female breast: Secondary | ICD-10-CM | POA: Diagnosis not present

## 2018-03-10 DIAGNOSIS — Z170 Estrogen receptor positive status [ER+]: Secondary | ICD-10-CM | POA: Diagnosis not present

## 2018-03-10 DIAGNOSIS — C50812 Malignant neoplasm of overlapping sites of left female breast: Secondary | ICD-10-CM

## 2018-03-10 DIAGNOSIS — Z9013 Acquired absence of bilateral breasts and nipples: Secondary | ICD-10-CM | POA: Diagnosis not present

## 2018-03-10 DIAGNOSIS — G4700 Insomnia, unspecified: Secondary | ICD-10-CM | POA: Diagnosis not present

## 2018-03-10 DIAGNOSIS — Z5111 Encounter for antineoplastic chemotherapy: Secondary | ICD-10-CM | POA: Diagnosis not present

## 2018-03-10 DIAGNOSIS — L658 Other specified nonscarring hair loss: Secondary | ICD-10-CM | POA: Diagnosis not present

## 2018-03-10 DIAGNOSIS — Z803 Family history of malignant neoplasm of breast: Secondary | ICD-10-CM | POA: Diagnosis not present

## 2018-03-10 DIAGNOSIS — Z87891 Personal history of nicotine dependence: Secondary | ICD-10-CM | POA: Diagnosis not present

## 2018-03-10 DIAGNOSIS — D329 Benign neoplasm of meninges, unspecified: Secondary | ICD-10-CM

## 2018-03-10 LAB — CBC WITH DIFFERENTIAL (CANCER CENTER ONLY)
BASOS ABS: 0 10*3/uL (ref 0.0–0.1)
Basophils Relative: 1 %
Eosinophils Absolute: 0 10*3/uL (ref 0.0–0.5)
Eosinophils Relative: 1 %
HEMATOCRIT: 31.8 % — AB (ref 34.8–46.6)
HEMOGLOBIN: 10.8 g/dL — AB (ref 11.6–15.9)
LYMPHS PCT: 46 %
Lymphs Abs: 0.6 10*3/uL — ABNORMAL LOW (ref 0.9–3.3)
MCH: 30.3 pg (ref 25.1–34.0)
MCHC: 34 g/dL (ref 31.5–36.0)
MCV: 89.1 fL (ref 79.5–101.0)
MONO ABS: 0.4 10*3/uL (ref 0.1–0.9)
Monocytes Relative: 27 %
NEUTROS ABS: 0.3 10*3/uL — AB (ref 1.5–6.5)
Neutrophils Relative %: 25 %
Platelet Count: 191 10*3/uL (ref 145–400)
RBC: 3.57 MIL/uL — AB (ref 3.70–5.45)
RDW: 13.3 % (ref 11.2–14.5)
WBC: 1.3 10*3/uL — AB (ref 3.9–10.3)

## 2018-03-10 LAB — CMP (CANCER CENTER ONLY)
ALBUMIN: 3.6 g/dL (ref 3.5–5.0)
ALT: 12 U/L (ref 0–55)
ANION GAP: 9 (ref 3–11)
AST: 12 U/L (ref 5–34)
Alkaline Phosphatase: 159 U/L — ABNORMAL HIGH (ref 40–150)
BILIRUBIN TOTAL: 0.2 mg/dL (ref 0.2–1.2)
BUN: 11 mg/dL (ref 7–26)
CO2: 25 mmol/L (ref 22–29)
Calcium: 9.2 mg/dL (ref 8.4–10.4)
Chloride: 102 mmol/L (ref 98–109)
Creatinine: 0.67 mg/dL (ref 0.60–1.10)
GFR, Est AFR Am: >60 mL/min (ref >60)
GFR, Estimated: >60 mL/min (ref >60)
GLUCOSE: 94 mg/dL (ref 70–140)
POTASSIUM: 4.1 mmol/L (ref 3.5–5.1)
SODIUM: 136 mmol/L (ref 136–145)
TOTAL PROTEIN: 6.2 g/dL — AB (ref 6.4–8.3)

## 2018-03-10 MED ORDER — CIPROFLOXACIN HCL 500 MG PO TABS: ORAL_TABLET | ORAL | 0 refills | Status: DC

## 2018-03-10 MED ORDER — LORAZEPAM 0.5 MG PO TABS: 0.5000 mg | ORAL_TABLET | Freq: Every evening | ORAL | 0 refills | Status: DC | PRN

## 2018-03-10 NOTE — Telephone Encounter (Signed)
Gave patient AVs and calendar of upcoming march and April appointments.  °

## 2018-03-11 ENCOUNTER — Encounter: Payer: Self-pay | Admitting: Physical Therapy

## 2018-03-11 ENCOUNTER — Telehealth: Payer: Self-pay | Admitting: *Deleted

## 2018-03-11 ENCOUNTER — Ambulatory Visit: Payer: PPO | Admitting: Physical Therapy

## 2018-03-11 DIAGNOSIS — M79602 Pain in left arm: Secondary | ICD-10-CM

## 2018-03-11 DIAGNOSIS — R222 Localized swelling, mass and lump, trunk: Secondary | ICD-10-CM

## 2018-03-11 DIAGNOSIS — M25611 Stiffness of right shoulder, not elsewhere classified: Secondary | ICD-10-CM

## 2018-03-11 DIAGNOSIS — M25612 Stiffness of left shoulder, not elsewhere classified: Secondary | ICD-10-CM

## 2018-03-11 DIAGNOSIS — M6281 Muscle weakness (generalized): Secondary | ICD-10-CM

## 2018-03-11 DIAGNOSIS — Z483 Aftercare following surgery for neoplasm: Secondary | ICD-10-CM

## 2018-03-11 MED ORDER — CEPHALEXIN 500 MG PO CAPS: 500.0000 mg | ORAL_CAPSULE | Freq: Two times a day (BID) | ORAL | 0 refills | Status: DC

## 2018-03-11 NOTE — Telephone Encounter (Signed)
This RN returned call to pt per her call stating " I need another antibiotic ".  Per discussion [name] stated concern due to " warning of possible irreversable tendon damage with use and my friend has this and has struggled "  Per MD review - prescription for keflex sent to pharmacy for coverage of low ANC.

## 2018-03-12 ENCOUNTER — Telehealth: Payer: Self-pay | Admitting: *Deleted

## 2018-03-12 NOTE — Therapy (Signed)
[place], [place], [address] Phone: [phone]   Fax:  [phone]  Physical Therapy Treatment  Patient Details  Name: Samantha Sullivan MRN: [id number] Date of Birth: 10-15-1951 Referring Provider: Dr. Aurelio Jew    Encounter Date: 03/11/2018  PT End of Session - 03/12/18 0923    Visit Number  9    Number of Visits  25    Date for PT Re-Evaluation  05/04/18    PT Start Time  3007    PT Stop Time  1430    PT Time Calculation (min)  45 min    Activity Tolerance  Patient tolerated treatment well    Behavior During Therapy  [facility] for tasks assessed/performed       Past Medical History:  Diagnosis Date  . Abnormal CT of the chest 12/06   numeroous lung nodules - most have resolved.  . Arthritis    psoriatic arthritis- toes & feet, but resolved at this point- no need meds at this point    . Cancer [facility])    right breast cancer  . Complication of anesthesia    /w colonoscopy- woke up before procedure was complete  . Family history of adverse reaction to anesthesia    pt's mother reported difficulty being given " enough " medicine to outcome a successful anesth. experience  . Family history of leukemia   . Family history of ovarian cancer   . History of meningioma 2007   in 2013 stable on MRI and recheck in 5 years, no surgery necessry   . HSV infection 1970's- 1980's  . Psoriatic arthritis [facility])     Past Surgical History:  Procedure Laterality Date  . BILATERAL TOTAL MASTECTOMY WITH AXILLARY LYMPH NODE DISSECTION Bilateral 01/04/2018   Procedure: BILATERAL TOTAL MASTECTOMY PROPHYLATIC RIGHT  WITH LEFT SENTINEAL  LYMPH NODE;  Surgeon: Excell Seltzer, MD;  Location: [place];  Service: General;  Laterality: Bilateral;  . BREAST LUMPECTOMY WITH RADIOACTIVE SEED AND SENTINEL LYMPH NODE BIOPSY Right 01/22/2016   Procedure: RIGHT BREAST LUMPECTOMY WITH RADIOACTIVE SEED AND SENTINEL LYMPH NODE BIOPSY;   Surgeon: Excell Seltzer, MD;  Location: [place];  Service: General;  Laterality: Right;  . BREAST SURGERY Left 1971, 2016   lumpectomy benign, 2016- Br. Ca- R lumpectomy & radiation   . COLONOSCOPY  8/06   normal and recheck in 10 years  . HAMMER TOE SURGERY Left 2016  . MASTECTOMY Right 01/04/2018   PROPHYLATIC   . MASTECTOMY COMPLETE / SIMPLE W/ SENTINEL NODE BIOPSY Left 01/04/2018  . PORTACATH PLACEMENT Right 01/04/2018   Procedure: INSERTION PORT-A-CATH;  Surgeon: Excell Seltzer, MD;  Location: [place];  Service: General;  Laterality: Right;  . THYROIDECTOMY, PARTIAL Left 1983    There were no vitals filed for this visit.  Subjective Assessment - 03/11/18 1348    Subjective  Pt has had a good visit with her grandchildren  but didn't do a lot with exercise.  She still has some pain down her arm occasionally.  She has tried using heat on her "lumps" in her upper arm per Dr. Excell Seltzer     Patient Stated Goals  get back to the normal "me"     Currently in Pain?  Yes    Pain Score  2     Pain Location  Arm    Pain Orientation  Left    Pain Descriptors / Indicators  Tightness    Pain Type  Acute pain  [facility] Goals - 03/04/18 1522      CC Long Term Goal  #1   Title  Pt will report a decrease in pain in left arm by 50%    Baseline  pt continues to have pain in left arm and abdomen from cording but it is less than 50%    Status  Achieved      CC Long Term Goal  #2   Title  Pt will have increased abduction of both shoulder to 140 degrees so that she can perform self care and household chores easier at home     Baseline  goal met for right arm on 02/09/2018 , and for left arm on 02/23/2018    Status  Achieved      CC Long Term Goal  #3   Title  Pt will verbalize and understading of lymphedema risk reduction and be able to manage her post op swelling at home     Baseline  has compression  sleeve and bra     Status  Achieved      CC Long Term Goal  #4   Title  Pt will be independent in a home exercise program     Baseline  needs to be updated to strengthening     Status  On-going      CC Long Term Goal  #5   Title  Pt will have reduction in cording and abdomen by 50%    Time  4    Period  Weeks    Status  New         Plan - 03/12/18 5397    Clinical Impression Statement  Pt continues to have some cording in her abdomen and left arm. 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  Used brief moist heat to areas prior to myofascial release today and seemed to have some benefit.  Upgraded foam in compression bra and pt seems to be benefitting from that as well.  She is ready to progress stregnthening as the cording my take a longer time to resolve     Clinical Impairments Affecting Rehab Potential  lymph nodes removed from both axillae    PT Frequency  2x / week    PT Duration  4 weeks    PT Treatment/Interventions  ADLs/Self Care Home Management;DME Instruction;Scar mobilization;Passive range of motion;Neuromuscular re-education;Therapeutic activities;Therapeutic exercise;Orthotic Fit/Training;Patient/family education;Compression bandaging;Manual lymph drainage;Manual techniques;Taping    PT Next Visit Plan  Progress strengthening exercise with rockwoods and scapualar work continue soft tissue work and manual lymph draiange to left arm and right chest and lateral trunk and cording at abdomen  continue  kinesiotape        Patient will benefit from skilled therapeutic intervention in order to improve the following deficits and impairments:  Decreased knowledge of use of DME, Decreased skin integrity, Increased fascial restricitons, Pain, Postural dysfunction, Decreased scar mobility, Decreased range of motion, Decreased strength, Impaired UE functional use, Increased edema, Decreased knowledge of  precautions  Visit Diagnosis: Aftercare following surgery for neoplasm  Stiffness of right shoulder, not elsewhere classified  Stiffness of left shoulder, not elsewhere classified  Pain in left arm  Muscle weakness (generalized)  Localized swelling, mass and lump, trunk     Problem List Patient Active Problem List   Diagnosis Date Noted  . Port-A-Cath in place 02/16/2018  . Stage I breast cancer, left ([place]) 01/04/2018  . Malignant  neoplasm of overlapping sites of left breast ([place]) 12/26/2017  . Genetic testing 02/26/2016  . Family history of ovarian cancer   . Family history of leukemia   . Psoriatic arthritis ([place]) 01/15/2016  . Meningioma ([place]) 01/15/2016  . Malignant neoplasm of upper-outer quadrant of right breast in female, estrogen receptor positive ([place]) 01/04/2016   Donato Heinz. Owens Shark PT  Norwood Levo 03/12/2018, 8:26 AM  [place], [place], [address] Phone: [phone]   Fax:  [phone]  Name: Samantha Sullivan MRN: [id number] Date of Birth: 05/26/1951

## 2018-03-12 NOTE — Telephone Encounter (Signed)
Left message to remind patient to be fasting 3 hours before her research lab work, to be drawn at her next routine lab visit.

## 2018-03-15 ENCOUNTER — Telehealth: Payer: Self-pay | Admitting: *Deleted

## 2018-03-15 NOTE — Telephone Encounter (Signed)
Called patient back, explained that research labs work did NOT need to be fasting at the 1 month visit, just 3 month visit.  Apologized for the error.  Patient expressed thanks for the update.

## 2018-03-15 NOTE — Telephone Encounter (Signed)
Spoke with patient and reminded her of 1 month research lab work and fasting requirement.  Patient verbalized understanding and will be fasting for at least 3 hours prior to her lab work tomorrow. Doreatha Martin, RN, BSN, [facility] 03/15/2018 12:01 PM

## 2018-03-16 ENCOUNTER — Inpatient Hospital Stay: Payer: PPO

## 2018-03-16 ENCOUNTER — Other Ambulatory Visit: Payer: Self-pay | Admitting: *Deleted

## 2018-03-16 VITALS — BP 145/87 | HR 88 | Temp 98.5°F | Resp 17

## 2018-03-16 DIAGNOSIS — C50411 Malignant neoplasm of upper-outer quadrant of right female breast: Secondary | ICD-10-CM

## 2018-03-16 DIAGNOSIS — C50812 Malignant neoplasm of overlapping sites of left female breast: Secondary | ICD-10-CM

## 2018-03-16 DIAGNOSIS — Z95828 Presence of other vascular implants and grafts: Secondary | ICD-10-CM

## 2018-03-16 DIAGNOSIS — Z170 Estrogen receptor positive status [ER+]: Secondary | ICD-10-CM

## 2018-03-16 DIAGNOSIS — Z5111 Encounter for antineoplastic chemotherapy: Secondary | ICD-10-CM | POA: Diagnosis not present

## 2018-03-16 LAB — CMP (CANCER CENTER ONLY)
ALBUMIN: 3.5 g/dL (ref 3.5–5.0)
ALT: 12 U/L (ref 0–55)
AST: 14 U/L (ref 5–34)
Alkaline Phosphatase: 142 U/L (ref 40–150)
Anion gap: 7 (ref 3–11)
BUN: 12 mg/dL (ref 7–26)
CHLORIDE: 106 mmol/L (ref 98–109)
CO2: 25 mmol/L (ref 22–29)
CREATININE: 0.7 mg/dL (ref 0.60–1.10)
Calcium: 9.4 mg/dL (ref 8.4–10.4)
GFR, Est AFR Am: >60 mL/min (ref >60)
GLUCOSE: 112 mg/dL (ref 70–140)
Potassium: 4.2 mmol/L (ref 3.5–5.1)
Sodium: 138 mmol/L (ref 136–145)
Total Bilirubin: 0.2 mg/dL (ref 0.2–1.2)
Total Protein: 6.3 g/dL — ABNORMAL LOW (ref 6.4–8.3)

## 2018-03-16 LAB — CBC WITH DIFFERENTIAL (CANCER CENTER ONLY)
Basophils Absolute: 0 10*3/uL (ref 0.0–0.1)
Basophils Relative: 0 %
EOS ABS: 0 10*3/uL (ref 0.0–0.5)
EOS PCT: 0 %
HCT: 33.8 % — ABNORMAL LOW (ref 34.8–46.6)
Hemoglobin: 11.6 g/dL (ref 11.6–15.9)
Lymphocytes Relative: 12 %
Lymphs Abs: 0.8 10*3/uL — ABNORMAL LOW (ref 0.9–3.3)
MCH: 30.9 pg (ref 25.1–34.0)
MCHC: 34.3 g/dL (ref 31.5–36.0)
MCV: 90.1 fL (ref 79.5–101.0)
MONO ABS: 0.8 10*3/uL (ref 0.1–0.9)
MONOS PCT: 12 %
Neutro Abs: 4.9 10*3/uL (ref 1.5–6.5)
Neutrophils Relative %: 76 %
PLATELETS: 276 10*3/uL (ref 145–400)
RBC: 3.75 MIL/uL (ref 3.70–5.45)
RDW: 14.2 % (ref 11.2–14.5)
WBC: 6.5 10*3/uL (ref 3.9–10.3)

## 2018-03-16 LAB — RESEARCH LABS

## 2018-03-16 MED ORDER — PEGFILGRASTIM 6 MG/0.6ML ~~LOC~~ PSKT
6.0000 mg | PREFILLED_SYRINGE | Freq: Once | SUBCUTANEOUS | Status: AC
  Administered 2018-03-16: 6 mg via SUBCUTANEOUS

## 2018-03-16 MED ORDER — SODIUM CHLORIDE 0.9% FLUSH
10.0000 mL | INTRAVENOUS | Status: DC | PRN
  Administered 2018-03-16: 10 mL
  Filled 2018-03-16: qty 10

## 2018-03-16 MED ORDER — SODIUM CHLORIDE 0.9 % IV SOLN
Freq: Once | INTRAVENOUS | Status: AC
  Administered 2018-03-16: 10:00:00 via INTRAVENOUS
  Filled 2018-03-16: qty 5

## 2018-03-16 MED ORDER — PEGFILGRASTIM 6 MG/0.6ML ~~LOC~~ PSKT
PREFILLED_SYRINGE | SUBCUTANEOUS | Status: AC
  Filled 2018-03-16: qty 0.6

## 2018-03-16 MED ORDER — PROCHLORPERAZINE MALEATE 10 MG PO TABS
10.0000 mg | ORAL_TABLET | Freq: Four times a day (QID) | ORAL | Status: DC | PRN
  Administered 2018-03-16: 10 mg via ORAL

## 2018-03-16 MED ORDER — SODIUM CHLORIDE 0.9 % IV SOLN
600.0000 mg/m2 | Freq: Once | INTRAVENOUS | Status: AC
  Administered 2018-03-16: 1180 mg via INTRAVENOUS
  Filled 2018-03-16: qty 59

## 2018-03-16 MED ORDER — PROCHLORPERAZINE MALEATE 10 MG PO TABS
ORAL_TABLET | ORAL | Status: AC
Start: 2018-03-16 — End: 2018-03-16
  Filled 2018-03-16: qty 1

## 2018-03-16 MED ORDER — HEPARIN SOD (PORK) LOCK FLUSH 100 UNIT/ML IV SOLN
500.0000 [IU] | Freq: Once | INTRAVENOUS | Status: AC | PRN
  Administered 2018-03-16: 500 [IU]
  Filled 2018-03-16: qty 5

## 2018-03-16 MED ORDER — SODIUM CHLORIDE 0.9 % IV SOLN
Freq: Once | INTRAVENOUS | Status: AC
  Administered 2018-03-16: 09:00:00 via INTRAVENOUS

## 2018-03-16 MED ORDER — DOXORUBICIN HCL CHEMO IV INJECTION 2 MG/ML
60.0000 mg/m2 | Freq: Once | INTRAVENOUS | Status: AC
  Administered 2018-03-16: 118 mg via INTRAVENOUS
  Filled 2018-03-16: qty 59

## 2018-03-16 NOTE — Patient Instructions (Signed)
[facility] Discharge Instructions for Patients Receiving Chemotherapy  Today you received the following chemotherapy agents: Doxorubicin (Adriamycin) and Cyclophosphamide (Cytoxan).  To help prevent nausea and vomiting after your treatment, we encourage you to take your nausea medication as prescribed. Received Aloxi during treatment-->take Compazine (not Zofran) for the next 3 days.  If you develop nausea and vomiting that is not controlled by your nausea medication, call the clinic.   BELOW ARE SYMPTOMS THAT SHOULD BE REPORTED IMMEDIATELY:  *FEVER GREATER THAN 100.5 F  *CHILLS WITH OR WITHOUT FEVER  NAUSEA AND VOMITING THAT IS NOT CONTROLLED WITH YOUR NAUSEA MEDICATION  *UNUSUAL SHORTNESS OF BREATH  *UNUSUAL BRUISING OR BLEEDING  TENDERNESS IN MOUTH AND THROAT WITH OR WITHOUT PRESENCE OF ULCERS  *URINARY PROBLEMS  *BOWEL PROBLEMS  UNUSUAL RASH Items with * indicate a potential emergency and should be followed up as soon as possible.  Feel free to call the clinic should you have any questions or concerns. The clinic phone number is [phone].  Please show the [facility] at check-in to the Emergency Department and triage nurse.  Doxorubicin injection What is this medicine? DOXORUBICIN (dox oh ROO bi sin) is a chemotherapy drug. It is used to treat many kinds of cancer like leukemia, lymphoma, neuroblastoma, sarcoma, and Wilms' tumor. It is also used to treat bladder cancer, breast cancer, lung cancer, ovarian cancer, stomach cancer, and thyroid cancer. This medicine may be used for other purposes; ask your health care provider or pharmacist if you have questions. COMMON BRAND NAME(S): Adriamycin, Adriamycin PFS, Adriamycin RDF, Rubex What should I tell my health care provider before I take this medicine? They need to know if you have any of these conditions: -heart disease -history of low blood counts caused by a medicine -liver disease -recent or  ongoing radiation therapy -an unusual or allergic reaction to doxorubicin, other chemotherapy agents, other medicines, foods, dyes, or preservatives -pregnant or trying to get pregnant -breast-feeding How should I use this medicine? This drug is given as an infusion into a vein. It is administered in a hospital or clinic by a specially trained health care professional. If you have pain, swelling, burning or any unusual feeling around the site of your injection, tell your health care professional right away. Talk to your pediatrician regarding the use of this medicine in children. Special care may be needed. Overdosage: If you think you have taken too much of this medicine contact a poison control center or emergency room at once. NOTE: This medicine is only for you. Do not share this medicine with others. What if I miss a dose? It is important not to miss your dose. Call your doctor or health care professional if you are unable to keep an appointment. What may interact with this medicine? This medicine may interact with the following medications: -6-mercaptopurine -paclitaxel -phenytoin -St. John's Wort -trastuzumab -verapamil This list may not describe all possible interactions. Give your health care provider a list of all the medicines, herbs, non-prescription drugs, or dietary supplements you use. Also tell them if you smoke, drink alcohol, or use illegal drugs. Some items may interact with your medicine. What should I watch for while using this medicine? This drug may make you feel generally unwell. This is not uncommon, as chemotherapy can affect healthy cells as well as cancer cells. Report any side effects. Continue your course of treatment even though you feel ill unless your doctor tells you to stop. There is a maximum amount  of this medicine you should receive throughout your life. The amount depends on the medical condition being treated and your overall health. Your doctor will  watch how much of this medicine you receive in your lifetime. Tell your doctor if you have taken this medicine before. You may need blood work done while you are taking this medicine. Your urine may turn red for a few days after your dose. This is not blood. If your urine is dark or brown, call your doctor. In some cases, you may be given additional medicines to help with side effects. Follow all directions for their use. Call your doctor or health care professional for advice if you get a fever, chills or sore throat, or other symptoms of a cold or flu. Do not treat yourself. This drug decreases your body's ability to fight infections. Try to avoid being around people who are sick. This medicine may increase your risk to bruise or bleed. Call your doctor or health care professional if you notice any unusual bleeding. Talk to your doctor about your risk of cancer. You may be more at risk for certain types of cancers if you take this medicine. Do not become pregnant while taking this medicine or for 6 months after stopping it. Women should inform their doctor if they wish to become pregnant or think they might be pregnant. Men should not father a child while taking this medicine and for 6 months after stopping it. There is a potential for serious side effects to an unborn child. Talk to your health care professional or pharmacist for more information. Do not breast-feed an infant while taking this medicine. This medicine has caused ovarian failure in some women and reduced sperm counts in some men This medicine may interfere with the ability to have a child. Talk with your doctor or health care professional if you are concerned about your fertility. What side effects may I notice from receiving this medicine? Side effects that you should report to your doctor or health care professional as soon as possible: -allergic reactions like skin rash, itching or hives, swelling of the face, lips, or  tongue -breathing problems -chest pain -fast or irregular heartbeat -low blood counts - this medicine may decrease the number of white blood cells, red blood cells and platelets. You may be at increased risk for infections and bleeding. -pain, redness, or irritation at site where injected -signs of infection - fever or chills, cough, sore throat, pain or difficulty passing urine -signs of decreased platelets or bleeding - bruising, pinpoint red spots on the skin, black, tarry stools, blood in the urine -swelling of the ankles, feet, hands -tiredness -weakness Side effects that usually do not require medical attention (report to your doctor or health care professional if they continue or are bothersome): -diarrhea -hair loss -mouth sores -nail discoloration or damage -nausea -red colored urine -vomiting This list may not describe all possible side effects. Call your doctor for medical advice about side effects. You may report side effects to FDA at 1-800-FDA-1088. Where should I keep my medicine? This drug is given in a hospital or clinic and will not be stored at home. NOTE: This sheet is a summary. It may not cover all possible information. If you have questions about this medicine, talk to your doctor, pharmacist, or health care provider.  2018 Elsevier/Gold Standard (2016-02-04 11:28:51)  Cyclophosphamide injection What is this medicine? CYCLOPHOSPHAMIDE (sye kloe FOSS fa mide) is a chemotherapy drug. It slows the growth of cancer  cells. This medicine is used to treat many types of cancer like lymphoma, myeloma, leukemia, breast cancer, and ovarian cancer, to name a few. This medicine may be used for other purposes; ask your health care provider or pharmacist if you have questions. COMMON BRAND NAME(S): Cytoxan, Neosar What should I tell my health care provider before I take this medicine? They need to know if you have any of these conditions: -blood disorders -history of other  chemotherapy -infection -kidney disease -liver disease -recent or ongoing radiation therapy -tumors in the bone marrow -an unusual or allergic reaction to cyclophosphamide, other chemotherapy, other medicines, foods, dyes, or preservatives -pregnant or trying to get pregnant -breast-feeding How should I use this medicine? This drug is usually given as an injection into a vein or muscle or by infusion into a vein. It is administered in a hospital or clinic by a specially trained health care professional. Talk to your pediatrician regarding the use of this medicine in children. Special care may be needed. Overdosage: If you think you have taken too much of this medicine contact a poison control center or emergency room at once. NOTE: This medicine is only for you. Do not share this medicine with others. What if I miss a dose? It is important not to miss your dose. Call your doctor or health care professional if you are unable to keep an appointment. What may interact with this medicine? This medicine may interact with the following medications: -amiodarone -amphotericin B -azathioprine -certain antiviral medicines for HIV or AIDS such as protease inhibitors (e.g., indinavir, ritonavir) and zidovudine -certain blood pressure medications such as benazepril, captopril, enalapril, fosinopril, lisinopril, moexipril, monopril, perindopril, quinapril, ramipril, trandolapril -certain cancer medications such as anthracyclines (e.g., daunorubicin, doxorubicin), busulfan, cytarabine, paclitaxel, pentostatin, tamoxifen, trastuzumab -certain diuretics such as chlorothiazide, chlorthalidone, hydrochlorothiazide, indapamide, metolazone -certain medicines that treat or prevent blood clots like warfarin -certain muscle relaxants such as succinylcholine -cyclosporine -etanercept -indomethacin -medicines to increase blood counts like filgrastim, pegfilgrastim, sargramostim -medicines used as general  anesthesia -metronidazole -natalizumab This list may not describe all possible interactions. Give your health care provider a list of all the medicines, herbs, non-prescription drugs, or dietary supplements you use. Also tell them if you smoke, drink alcohol, or use illegal drugs. Some items may interact with your medicine. What should I watch for while using this medicine? Visit your doctor for checks on your progress. This drug may make you feel generally unwell. This is not uncommon, as chemotherapy can affect healthy cells as well as cancer cells. Report any side effects. Continue your course of treatment even though you feel ill unless your doctor tells you to stop. Drink water or other fluids as directed. Urinate often, even at night. In some cases, you may be given additional medicines to help with side effects. Follow all directions for their use. Call your doctor or health care professional for advice if you get a fever, chills or sore throat, or other symptoms of a cold or flu. Do not treat yourself. This drug decreases your body's ability to fight infections. Try to avoid being around people who are sick. This medicine may increase your risk to bruise or bleed. Call your doctor or health care professional if you notice any unusual bleeding. Be careful brushing and flossing your teeth or using a toothpick because you may get an infection or bleed more easily. If you have any dental work done, tell your dentist you are receiving this medicine. You may get drowsy or  dizzy. Do not drive, use machinery, or do anything that needs mental alertness until you know how this medicine affects you. Do not become pregnant while taking this medicine or for 1 year after stopping it. Women should inform their doctor if they wish to become pregnant or think they might be pregnant. Men should not father a child while taking this medicine and for 4 months after stopping it. There is a potential for serious side  effects to an unborn child. Talk to your health care professional or pharmacist for more information. Do not breast-feed an infant while taking this medicine. This medicine may interfere with the ability to have a child. This medicine has caused ovarian failure in some women. This medicine has caused reduced sperm counts in some men. You should talk with your doctor or health care professional if you are concerned about your fertility. If you are going to have surgery, tell your doctor or health care professional that you have taken this medicine. What side effects may I notice from receiving this medicine? Side effects that you should report to your doctor or health care professional as soon as possible: -allergic reactions like skin rash, itching or hives, swelling of the face, lips, or tongue -low blood counts - this medicine may decrease the number of white blood cells, red blood cells and platelets. You may be at increased risk for infections and bleeding. -signs of infection - fever or chills, cough, sore throat, pain or difficulty passing urine -signs of decreased platelets or bleeding - bruising, pinpoint red spots on the skin, black, tarry stools, blood in the urine -signs of decreased red blood cells - unusually weak or tired, fainting spells, lightheadedness -breathing problems -dark urine -dizziness -palpitations -swelling of the ankles, feet, hands -trouble passing urine or change in the amount of urine -weight gain -yellowing of the eyes or skin Side effects that usually do not require medical attention (report to your doctor or health care professional if they continue or are bothersome): -changes in nail or skin color -hair loss -missed menstrual periods -mouth sores -nausea, vomiting This list may not describe all possible side effects. Call your doctor for medical advice about side effects. You may report side effects to FDA at 1-800-FDA-1088. Where should I keep my  medicine? This drug is given in a hospital or clinic and will not be stored at home. NOTE: This sheet is a summary. It may not cover all possible information. If you have questions about this medicine, talk to your doctor, pharmacist, or health care provider.  2018 Elsevier/Gold Standard (2012-10-22 16:22:58)

## 2018-03-18 ENCOUNTER — Telehealth: Payer: Self-pay | Admitting: *Deleted

## 2018-03-18 ENCOUNTER — Ambulatory Visit: Payer: PPO

## 2018-03-18 DIAGNOSIS — R222 Localized swelling, mass and lump, trunk: Secondary | ICD-10-CM

## 2018-03-18 DIAGNOSIS — Z483 Aftercare following surgery for neoplasm: Secondary | ICD-10-CM

## 2018-03-18 DIAGNOSIS — M79602 Pain in left arm: Secondary | ICD-10-CM

## 2018-03-18 DIAGNOSIS — M6281 Muscle weakness (generalized): Secondary | ICD-10-CM

## 2018-03-18 DIAGNOSIS — M25612 Stiffness of left shoulder, not elsewhere classified: Secondary | ICD-10-CM

## 2018-03-18 DIAGNOSIS — M25611 Stiffness of right shoulder, not elsewhere classified: Secondary | ICD-10-CM

## 2018-03-18 NOTE — Patient Instructions (Signed)
Strengthening: Resisted Flexion    Cancer Rehab [phone]    Hold tubing with left arm at side. Pull forward and up. Move shoulder through pain-free range of motion. Repeat _5-10___ times per set. Do _1-2___ sessions per day.  Strengthening: Resisted Internal Rotation    Hold tubing in left hand, elbow at side and forearm out. Rotate forearm in across body. Repeat _5-10___ times per set. Do _1-2___ sessions per day.  Strengthening: Resisted Extension    Hold tubing in left hand, arm forward. Pull arm back, elbow straight. Repeat __5-10__ times per set. Do __1-2__ sessions per day.   Strengthening: Resisted External Rotation    Hold tubing in left hand, elbow at side and forearm across body. Rotate forearm out. Repeat _5-10___ times per set. Do __1-2__ sessions per day.    3 Way Raises:      Starting Position:  Leaning against wall, walk feet a few inches away from the wall and make tummy tight (tuck hips underneath you) Press back/shoulders/head against wall as much as possible. Keep thumbs up to ceiling, elbows straight and shoulders relaxed/down throughout.  1. Lift arms in front to shoulder height 2. Lift arms a little wider into a "V" to shoulder height 3. Lift arms out to sides in a "T" to shoulder height  Perform 10 times in each direction. Hold 1-2 lbs to start with and work up to 2-3 sets of 10/day. Perform 3-4 times/week. Increase weight as able, decreasing sets of 10 each time you increase weights, then slowly working your way back up to 2-3 sets each time.

## 2018-03-18 NOTE — Therapy (Signed)
[place], [place], [address] Phone: [phone]   Fax:  [phone]  Physical Therapy Treatment  Patient Details  Name: Samantha Sullivan MRN: [id number] Date of Birth: 1951/11/21 Referring Provider: Dr. Aurelio Jew    Encounter Date: 03/18/2018  PT End of Session - 03/18/18 1020    Visit Number  10    Number of Visits  25    Date for PT Re-Evaluation  05/04/18    PT Start Time  0931    PT Stop Time  1020    PT Time Calculation (min)  49 min    Activity Tolerance  Patient tolerated treatment well    Behavior During Therapy  [facility] for tasks assessed/performed       Past Medical History:  Diagnosis Date  . Abnormal CT of the chest 12/06   numeroous lung nodules - most have resolved.  . Arthritis    psoriatic arthritis- toes & feet, but resolved at this point- no need meds at this point    . Cancer [facility])    right breast cancer  . Complication of anesthesia    /w colonoscopy- woke up before procedure was complete  . Family history of adverse reaction to anesthesia    pt's mother reported difficulty being given " enough " medicine to outcome a successful anesth. experience  . Family history of leukemia   . Family history of ovarian cancer   . History of meningioma 2007   in 2013 stable on MRI and recheck in 5 years, no surgery necessry   . HSV infection 1970's- 1980's  . Psoriatic arthritis [facility])     Past Surgical History:  Procedure Laterality Date  . BILATERAL TOTAL MASTECTOMY WITH AXILLARY LYMPH NODE DISSECTION Bilateral 01/04/2018   Procedure: BILATERAL TOTAL MASTECTOMY PROPHYLATIC RIGHT  WITH LEFT SENTINEAL  LYMPH NODE;  Surgeon: Excell Seltzer, MD;  Location: [place];  Service: General;  Laterality: Bilateral;  . BREAST LUMPECTOMY WITH RADIOACTIVE SEED AND SENTINEL LYMPH NODE BIOPSY Right 01/22/2016   Procedure: RIGHT BREAST LUMPECTOMY WITH RADIOACTIVE SEED AND SENTINEL LYMPH NODE BIOPSY;   Surgeon: Excell Seltzer, MD;  Location: [place];  Service: General;  Laterality: Right;  . BREAST SURGERY Left 1971, 2016   lumpectomy benign, 2016- [facility]. Ca- R lumpectomy & radiation   . COLONOSCOPY  8/06   normal and recheck in 10 years  . HAMMER TOE SURGERY Left 2016  . MASTECTOMY Right 01/04/2018   PROPHYLATIC   . MASTECTOMY COMPLETE / SIMPLE W/ SENTINEL NODE BIOPSY Left 01/04/2018  . PORTACATH PLACEMENT Right 01/04/2018   Procedure: INSERTION PORT-A-CATH;  Surgeon: Excell Seltzer, MD;  Location: [place];  Service: General;  Laterality: Right;  . THYROIDECTOMY, PARTIAL Left 1983    There were no vitals filed for this visit.  Subjective Assessment - 03/18/18 0939    Subjective  Not much change with the cording but I know that is going to take time with my condition. The knot in my upper arm feels a little bigger today, I think it's some of the cords bundle up.    Pertinent History  right breast cancer in 2017 with lumpectomy , sentinel node biopsy and radiation ( no chemo) left breast cancer in Dec. 26 on regular mammogram and had bilataral mastectomy with left node biospy (3 nodes ) on Jan 04 2018 drains removed 2 1/2 weeks later . has a port on her right side and plans to have 5 months of chemo  but no radiation     Patient Stated Goals  get back to the normal "me"     Currently in Pain?  No/denies                No data recorded       [facility] Adult PT Treatment/Exercise - 03/18/18 0001      Shoulder Exercises: Standing   External Rotation  Strengthening;Left;10 reps;Theraband    Theraband Level (Shoulder External Rotation)  Level 2 (Red)    Internal Rotation  Strengthening;Left;10 reps;Theraband    Theraband Level (Shoulder Internal Rotation)  Level 2 (Red)    Flexion  Strengthening;Left;10 reps;Theraband    Theraband Level (Shoulder Flexion)  Level 2 (Red)    Extension  Strengthening;Left;10 reps;Theraband    Theraband Level (Shoulder  Extension)  Level 2 (Red)    Other Standing Exercises  Bil UE 3 way raises with 3 lbs, 2 x 10 times each (flexion, scaption and abduction to shoulder height), with pt returning correct therapist demonstration      Moist Heat Therapy   Number Minutes Moist Heat  7 Minutes    Moist Heat Location  Wrist;Elbow;Other (comment) Towel to Lt anterior arm; done during Rt side manual work      Manual Therapy   Manual Therapy  Myofascial release;Manual Lymphatic Drainage (MLD);Passive ROM    Myofascial Release  To Lt axilla, but mostly to anterior elbow, 3 pops palpated by therapist and pt during prolonged hold stretching at forearm and anterior elbow area    Manual Lymphatic Drainage (MLD)  In Supine: Short neck, superficial and deep abdominals, Rt inguinal and Rt axillo-inguinal anstomosis and upper arm sequence and anterior upper anterior chest near axilla at swelling, then same to Rt side but only to anastomosis.    Passive ROM  To Lt shoulder into flexion, abduction and D2 to pts tolerance             PT Education - 03/18/18 0947    Education provided  Yes    Education Details  Standing bil UE 3 way raises and [place] with red theraband to Lt UE    Person(s) Educated  Patient    Methods  Explanation;Demonstration;Handout    Comprehension  Verbalized understanding;Returned demonstration;Need further instruction             [facility] Goals - 03/04/18 1522      CC Long Term Goal  #1   Title  Pt will report a decrease in pain in left arm by 50%    Baseline  pt continues to have pain in left arm and abdomen from cording but it is less than 50%    Status  Achieved      CC Long Term Goal  #2   Title  Pt will have increased abduction of both shoulder to 140 degrees so that she can perform self care and household chores easier at home     Baseline  goal met for right arm on 02/09/2018 , and for left arm on 02/23/2018    Status  Achieved      CC Long Term Goal  #3   Title  Pt  will verbalize and understading of lymphedema risk reduction and be able to manage her post op swelling at home     Baseline  has compression sleeve and bra     Status  Achieved      CC Long Term Goal  #4   Title  Pt will be  independent in a home exercise program     Baseline  needs to be updated to strengthening     Status  On-going      CC Long Term Goal  #5   Title  Pt will have reduction in cording and abdomen by 50%    Time  4    Period  Weeks    Status  New         Plan - 03/18/18 1023    Clinical Impression Statement  Pt reports cording in abdomen about the same, but maybe slightly less pronounced at Lt side. She had palpable knot superior to antecubital fossa at beginning of session which had completely diminished by end of session after manual therapy and at least 3 palpable "pops" during gentle, prolonged holds with myofascial release. Also used moist heat prior to stretching with towel for padding to anterior elbow as done at last session as pt reported this had helped and it continued to do so today.  Progressed pts HEP to include bil UE strength which she tolerated very well and may have also helped with promoting good myofascial stretching of cording after.     Rehab Potential  Good    Clinical Impairments Affecting Rehab Potential  lymph nodes removed from both axillae    PT Frequency  2x / week    PT Duration  4 weeks    PT Treatment/Interventions  ADLs/Self Care Home Management;DME Instruction;Scar mobilization;Passive range of motion;Neuromuscular re-education;Therapeutic activities;Therapeutic exercise;Orthotic Fit/Training;Patient/family education;Compression bandaging;Manual lymph drainage;Manual techniques;Taping    PT Next Visit Plan  Review Rockwoods and cont scapular work; continue soft tissue work and manual lymph drainage to left arm and right chest and lateral trunk and cording at abdomen, light heat to cording which can help with pts condition of [place];  continue  kinesiotape     PT Home Exercise Plan  supine dowel flexion and abduction , supince scapular series, supine protraciton; [place] and bil UE 3 way raises    Consulted and Agree with Plan of Care  Patient       Patient will benefit from skilled therapeutic intervention in order to improve the following deficits and impairments:  Decreased knowledge of use of DME, Decreased skin integrity, Increased fascial restricitons, Pain, Postural dysfunction, Decreased scar mobility, Decreased range of motion, Decreased strength, Impaired UE functional use, Increased edema, Decreased knowledge of precautions  Visit Diagnosis: Aftercare following surgery for neoplasm  Stiffness of right shoulder, not elsewhere classified  Stiffness of left shoulder, not elsewhere classified  Pain in left arm  Muscle weakness (generalized)  Localized swelling, mass and lump, trunk     Problem List Patient Active Problem List   Diagnosis Date Noted  . Port-A-Cath in place 02/16/2018  . Stage I breast cancer, left ([place]) 01/04/2018  . Malignant neoplasm of overlapping sites of left breast ([place]) 12/26/2017  . Genetic testing 02/26/2016  . Family history of ovarian cancer   . Family history of leukemia   . Psoriatic arthritis ([place]) 01/15/2016  . Meningioma ([place]) 01/15/2016  . Malignant neoplasm of upper-outer quadrant of right breast in female, estrogen receptor positive ([place]) 01/04/2016    Otelia Limes, PTA 03/18/2018, 11:33 AM  [place], [place], [address] Phone: [phone]   Fax:  [phone]  Name: Samantha Sullivan MRN: [id number] Date of Birth: 1951/12/14

## 2018-03-18 NOTE — Telephone Encounter (Signed)
[id number] [id number] Study: [name] called to ensure the correct blood specimen were collected for the study at her most recent visit.  This RN confirmed it was. Patient also said someone in lab told her she would be getting a gift card, but patient did not believe this to be accurate.  This RN confirmed that the month 1 blood draw was not a study visit that included gift card compensation; the 3 month visit would include gift card compensation.  Timeline for 3 month visit discussed.  Patient expressed thanks for the clarification. Doreatha Martin, RN, BSN, [facility] 03/18/2018 2:25 PM

## 2018-03-22 NOTE — Progress Notes (Signed)
[place]  [phone] [phone]     ID: [name] DOB: 1951/03/25  MR#: [id number]  PNT#:[id number]  Patient Care Team: Shon Baton, MD as PCP - General (Internal Medicine) , Virgie Dad, MD as Consulting Physician (Oncology) Kem Boroughs, [name] as Nurse Practitioner (Family Medicine) Regal, Tamala Fothergill, DPM as Consulting Physician (Podiatry) Excell Seltzer, MD as Consulting Physician (General Surgery) Force, Shari Prows, DO as Referring Physician (Student) PCP: Shon Baton, MD OTHER MD:  CHIEF COMPLAINT: Contralateral breast cancer, triple negative  CURRENT TREATMENT: Adjuvant chemotherapy  INTERVAL HISTORY: [name] returns today for follow-up of her triple negative breast cancer. She is being treated with cyclophosphamide and doxorubicin given every 14 days, today being day 8 from cycle 3 of 4 planned cycles, to be followed by weekly paclitaxel/carboplatin x12. She notes that her 3rd cycle didn't go well. On days 1-4 and part of day 5 she did well. On day 5 evening up until today, she started feeling bad with a lack of energy and feeling queasy/nauseous . Her queasiness is improved with eating.  She continues to receive Neulasta (OnPro), with good tolerance.      REVIEW OF SYSTEMS: [name] reports that for exercise, she walks. She was able to run errands yesterday to several stores, however she was fatigued. She notes that she is sleeping well. She denies vomiting, constipation, fever, rash, diarrhea. She denies unusual headaches, visual changes, nausea, vomiting, or dizziness. There has been no unusual cough, phlegm production, or pleurisy. This been no change in bowel or bladder habits. She denies unexplained fatigue or unexplained weight loss, bleeding, rash, or fever. A detailed review of systems was otherwise stable.       RIGHT BREAST CANCER HISTORY:  From the original intake note:  "[name]"noted a lump in her right breast  sometime in late October or early November, but there were "so many things going on in her life" she did not bring it to medical attention until she saw [name] 12/11/2015. [name] was able to palpate a mass at the 9:30 o'clock position in the right breast, which was mobile and nontender. The left breast is status post prior remote biopsy and there was some scar tissue at the 7:00 position. The patient was set up for bilateral diagnostic Mammography with tomosynthesis at [facility] 12/27/2015. This found the breast density to be category C. There was a new oval mass in the right breast upper outer quadrant. Ultrasound the same day confirmed a 2.2 cm mass with irregular margins in the right breast at the 9:00 position read as highly suggestive of malignancy.  Biopsy of the right breast mass in question 01/02/2016 found ([id number]) an invasive ductal carcinoma, grade 1, estrogen receptor 100% positive, progesterone receptor 90% positive, both with strong staining intensity, with an MIB-1 of 10%, and no HER-2 amplification, the signals ratio being 1.32 and the number per cell 2.05. The patient was informed and instructed to stop hormone replacement therapy (last dose 01/04/2016).  Her subsequent history is as detailed below.    PAST MEDICAL HISTORY: Past Medical History:  Diagnosis Date  . Abnormal CT of the chest 12/06   numeroous lung nodules - most have resolved.  . Arthritis    psoriatic arthritis- toes & feet, but resolved at this point- no need meds at this point    . Cancer [facility])    right breast cancer  . Complication of anesthesia    /w colonoscopy- woke up before procedure was complete  .  Family history of adverse reaction to anesthesia    pt's mother reported difficulty being given " enough " medicine to outcome a successful anesth. experience  . Family history of leukemia   . Family history of ovarian cancer   . History of meningioma 2007   in 2013 stable on MRI and recheck in 5  years, no surgery necessry   . HSV infection 1970's- 1980's  . Psoriatic arthritis ([place])     PAST SURGICAL HISTORY: Past Surgical History:  Procedure Laterality Date  . BILATERAL TOTAL MASTECTOMY WITH AXILLARY LYMPH NODE DISSECTION Bilateral 01/04/2018   Procedure: BILATERAL TOTAL MASTECTOMY PROPHYLATIC RIGHT  WITH LEFT SENTINEAL  LYMPH NODE;  Surgeon: Excell Seltzer, MD;  Location: [place];  Service: General;  Laterality: Bilateral;  . BREAST LUMPECTOMY WITH RADIOACTIVE SEED AND SENTINEL LYMPH NODE BIOPSY Right 01/22/2016   Procedure: RIGHT BREAST LUMPECTOMY WITH RADIOACTIVE SEED AND SENTINEL LYMPH NODE BIOPSY;  Surgeon: Excell Seltzer, MD;  Location: [place];  Service: General;  Laterality: Right;  . BREAST SURGERY Left 1971, 2016   lumpectomy benign, 2016- Br. Ca- R lumpectomy & radiation   . COLONOSCOPY  8/06   normal and recheck in 10 years  . HAMMER TOE SURGERY Left 2016  . MASTECTOMY Right 01/04/2018   PROPHYLATIC   . MASTECTOMY COMPLETE / SIMPLE W/ SENTINEL NODE BIOPSY Left 01/04/2018  . PORTACATH PLACEMENT Right 01/04/2018   Procedure: INSERTION PORT-A-CATH;  Surgeon: Excell Seltzer, MD;  Location: [place];  Service: General;  Laterality: Right;  . THYROIDECTOMY, PARTIAL Left 1983    FAMILY HISTORY Family History  Problem Relation Age of Onset  . Osteoporosis Mother   . Osteoporosis Sister   . Lung cancer Maternal Aunt        smoker  . Heart Problems Maternal Grandmother   . COPD Maternal Grandfather   . Neurodegenerative disease Maternal Aunt        corical-ganglion degeneration  . Ovarian cancer Maternal Aunt        dx in her 70s  . Leukemia Cousin        8s-40s; maternal cousin  [name] has essentially no information on her father's side of the family aside from the fact that he died in his 68s from heart disease. He had psoriasis. The patient's mother passed away 03-19-2017 age 59, due to breast cancer. The patient had 2 brothers who died from  congenital heart disease shortly after birth. The patient has one sister. The patient's mother had one sister, the patient's aunt, diagnosed with ovarian cancer in her 74s.  GYNECOLOGIC HISTORY:  Patient's last menstrual period was 03/22/2000 (approximate). Menarche age 23, first live birth age 77. The patient is GX P2. She went through menopause approximately 1990 but was taking hormone replacement until 01/04/2016. Since coming off replacement she has had insomnia but no hot flashes or vaginal dryness pleural bones so 4. 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 She is divorced and lives alone, with no pets. Her daughter [name] lives in [place]. She is disabled secondary to schizoaffective disorder. Daughter [name] lives in [place] and she is a Estate agent but mostly a homemaker. The patient has 3 grandchildren, the older 33, the other 2 being under 60 years old. The patient attends [employer]. Her mother passed away in 03-19-17 due to breast  cancer.    ADVANCED DIRECTIVES: her daughter [name] is her healthcare power of attorney. She can be reached at [place]: Social History   Tobacco Use  . Smoking status: Former Smoker    Packs/day: 1.00    Years: 12.00    Pack years: 12.00    Types: Cigarettes    Last attempt to quit: 07/23/1981    Years since quitting: 36.6  . Smokeless tobacco: Never Used  Substance Use Topics  . Alcohol use: Yes    Alcohol/week: 0.0 oz    Comment: social occasion only- special   . Drug use: No     Colonoscopy: ; [facility]  HGD:JMEQAST 2016/[place]  Bone density:rJanuary 2018/normal   Lipid panel:  Allergies  Allergen Reactions  . Bee Venom   . Norco [Hydrocodone-Acetaminophen] Other (See Comments)    Pt states Norco made her  extremely hyper after her surgery and she was not able to sleep.    Current Outpatient Medications  Medication Sig Dispense Refill  . cephALEXin (KEFLEX) 500 MG capsule Take 1 capsule (500 mg total) by mouth 2 (two) times daily. 14 capsule 0  . ciprofloxacin (CIPRO) 500 MG tablet Take 1 tablet (500 mg) twice daily for 5 days.  Then take as directed 30 tablet 0  . dexamethasone (DECADRON) 4 MG tablet Take 2 tablets by mouth once a day on the day after chemotherapy and then take 2 tablets two times a day for 2 days. Take with food. 30 tablet 1  . FLURANDRENOLIDE EX Apply 1 application topically 2 (two) times daily as needed (for psoriasis). Cordran Tape    . folic acid (FOLVITE) 1 MG tablet Take 2 mg by mouth every evening.    . lidocaine-prilocaine (EMLA) cream Apply to affected area once 30 g 3  . LORazepam (ATIVAN) 0.5 MG tablet Take 1 tablet (0.5 mg total) by mouth at bedtime as needed and may repeat dose one time if needed (Nausea or vomiting). 10 tablet 0  . Polyethyl Glycol-Propyl Glycol (LUBRICANT EYE DROPS) 0.4-0.3 % SOLN Place 1-2 drops into both eyes 3 (three) times daily as needed (dry eyes/irritated eyes.).    Marland Kitchen prochlorperazine (COMPAZINE) 10 MG tablet Take 1 tablet (10 mg total) by mouth every 6 (six) hours as needed (Nausea or vomiting). 30 tablet 1  . traMADol (ULTRAM) 50 MG tablet Take 1 tablet (50 mg total) by mouth every 6 (six) hours as needed (mild pain). 20 tablet 0   Current Facility-Administered Medications  Medication Dose Route Frequency Provider Last Rate Last Dose  . triamcinolone acetonide (KENALOG) 10 MG/ML injection 10 mg  10 mg Other Once Wallene Huh, DPM        OBJECTIVE: middle-aged white woman in no acute distress  Vitals:   03/23/18 1109  BP: (!) 142/87  Pulse: 93  Resp: 18  Temp: 98.4 F (36.9 C)  SpO2: 100%     Body mass index is 29.15 kg/m.    ECOG FS:1 - Symptomatic but completely ambulatory  Sclerae unicteric, EOMs intact Oropharynx clear  and moist No cervical or supraclavicular adenopathy Lungs no rales or rhonchi Heart regular rate and rhythm Abd soft, nontender, positive bowel sounds MSK no focal spinal tenderness, no upper extremity lymphedema Neuro: nonfocal, well oriented, appropriate affect Breasts: Status post bilateral mastectomies.  There is an area in the upper lateral anterior chest wall that bothers her a little, but there is no finding there of concern.  Both axillae are benign.  LAB RESULTS:  CMP     Component Value Date/Time   NA 138 03/16/2018 0813   NA 141 08/05/2017 1335   K 4.2 03/16/2018 0813   K 4.5 08/05/2017 1335   CL 106 03/16/2018 0813   CO2 25 03/16/2018 0813   CO2 29 08/05/2017 1335   GLUCOSE 112 03/16/2018 0813   GLUCOSE 77 08/05/2017 1335   BUN 12 03/16/2018 0813   BUN 13.4 08/05/2017 1335   CREATININE 0.70 03/16/2018 0813   CREATININE 0.8 08/05/2017 1335   CALCIUM 9.4 03/16/2018 0813   CALCIUM 10.0 08/05/2017 1335   PROT 6.3 (L) 03/16/2018 0813   PROT 6.8 08/05/2017 1335   ALBUMIN 3.5 03/16/2018 0813   ALBUMIN 3.6 08/05/2017 1335   AST 14 03/16/2018 0813   AST 19 08/05/2017 1335   ALT 12 03/16/2018 0813   ALT 12 08/05/2017 1335   ALKPHOS 142 03/16/2018 0813   ALKPHOS 169 (H) 08/05/2017 1335   BILITOT 0.2 03/16/2018 0813   BILITOT 0.63 08/05/2017 1335   GFRNONAA >60 03/16/2018 0813   GFRAA >60 03/16/2018 0813    INo results found for: SPEP, UPEP  Lab Results  Component Value Date   WBC 1.1 (L) 03/23/2018   NEUTROABS 0.8 (L) 03/23/2018   HGB 13.1 01/05/2018   HCT 30.4 (L) 03/23/2018   MCV 89.1 03/23/2018   PLT 203 03/23/2018      Chemistry      Component Value Date/Time   NA 138 03/16/2018 0813   NA 141 08/05/2017 1335   K 4.2 03/16/2018 0813   K 4.5 08/05/2017 1335   CL 106 03/16/2018 0813   CO2 25 03/16/2018 0813   CO2 29 08/05/2017 1335   BUN 12 03/16/2018 0813   BUN 13.4 08/05/2017 1335   CREATININE 0.70 03/16/2018 0813   CREATININE 0.8 08/05/2017  1335      Component Value Date/Time   CALCIUM 9.4 03/16/2018 0813   CALCIUM 10.0 08/05/2017 1335   ALKPHOS 142 03/16/2018 0813   ALKPHOS 169 (H) 08/05/2017 1335   AST 14 03/16/2018 0813   AST 19 08/05/2017 1335   ALT 12 03/16/2018 0813   ALT 12 08/05/2017 1335   BILITOT 0.2 03/16/2018 0813   BILITOT 0.63 08/05/2017 1335       No results found for: LABCA2  No components found for: LABCA125  No results for input(s): INR in the last 168 hours.  Urinalysis    Component Value Date/Time   BILIRUBINUR neg 12/11/2015 0925   PROTEINUR neg 12/11/2015 0925   UROBILINOGEN negative 12/11/2015 0925   NITRITE neg 12/11/2015 0925   LEUKOCYTESUR Negative 12/11/2015 0925    STUDIES: No results found.  RESEARCH: enrolled in UPBEAT study; not a candidate for BR 003 because of concurrent cancer  ASSESSMENT: 67 y.o. [place] woman status post right breast upper outer quadrant biopsy 01/02/2016 for a clinical T2 N0, stage IIA invasive ductal carcinoma, grade 1, estrogen and progesterone receptor positive, HER-2 not amplified, with an MIB-1 of 10%.  (1). Right lumpectomy and sentinel lymph node sampling 01/22/2016 showed a pT1c pN0, stage IA invasive ductal carcinoma, grade 1, with close but negative margins. Repeat HER-2 testing was again not amplified   (3) Oncotype score of 18 predicts a risk of recurrence outside the breast within the next 10 years of 11% if the patient's only systemic therapy is tamoxifen for 5 years. It also predicts no significant benefit from chemotherapy.     (4) adjuvant radiation3/14/17-4/11/17 Site/dose:   Right  breast - 42.72 gy at 2.67 Gy per fraction x 16 fractions followed by an electron boost to the right breast with 10 Gy at 2 Gy per fraction x 5 fractions.    (5)  Anastrozole started 04/21/2016, discontinued after approximately 1 month with significant side effects  (a)  Bone density at [facility] September 2013 was normal  (b) bone density  January 2018 was normal  (c) anastrozole restarted February 2018, held again at the start of chemotherapy February 2019  (6) genetics testing  02/24/2016 through the Breast/Ovarian cancer gene panel offered by [facility] found no deleterious mutations in ATM, BARD1, BRCA1, BRCA2, BRIP1, CDH1, CHEK2, EPCAM, FANCC, MLH1, MSH2, MSH6, NBN, PALB2, PMS2, PTEN, RAD51C, RAD51D, TP53, and XRCC2.   (7) left breast biopsy 12/17/2017 showed a cT2 cN0, stage IIA- IIB invasive ductal carcinoma, grade 2, estrogen receptor weakly positive, progesterone receptor negative, with HER-2 equivocal by FISH, negative by immunohistochemistry  (8) status post bilateral mastectomies and left sentinel lymph node sampling 01/04/2018 showing  (a) on the right, no evidence of disease  (b) on the left, a pT2 pN0, stage IIB invasive ductal carcinoma, grade 3, triple negative  (9) adjuvant chemotherapy will consist of cyclophosphamide and doxorubicin in dose dense fashion x4 starting 02/16/2018 followed by weekly paclitaxel /carboplatin x12    PLAN: [name] did generally well with her third cycle but she had increased fatigue beginning on Saturday (ay 5).  Of course chemotherapy can cause fatigue and with continuing cycles 1 does feel more fatigued, but I think what she is really experiencing is coming off the steroids.  Today we decided we are going to continue the steroids on Saturday Sunday and Monday of her next cycle, with 4 mg Saturday morning and then 2 mg Sunday and Monday, all of that at breakfast.  I really think that is going to take care of the fatigue issue  In addition to that she is having reflux and gastritis problems.  I am starting her on omeprazole once a day for the next 30 days.  That hopefully will take care of that issue  She tells me she has a friend who has been disabled secondary to ciprofloxacin tendinitis.  She does not want any quinolones at any time for any reason.  I have added those drugs into her  allergy list.  Today however her [place] is 800 and she does not need prophylactic antibiotics.  She does need to call us of course if she develops a temperature  She brought [place] records from [facility] for me to review which I will be glad to do  She will return in 1 week for her final cycle of cyclophosphamide and doxorubicin.  She will see me a week later and at that point we will set her up for her 12 cycles of weekly carboplatin and paclitaxel.  She will not need Neupogen with those treatments and she is unlikely to have the same symptoms she has been having with cycle 3.  This was reassuring to her  She knows to call for any other problems that may develop before the next visit. Cherryl Babin, Virgie Dad, MD  03/23/18 11:39 AM Medical Oncology and Hematology [facility] [address] [place], [place] [address] Tel. [phone]    Fax. [phone]    This document serves as a record of services personally performed by Lurline Del, MD. It was created on his behalf by Steva Colder, a trained medical scribe. The creation of this record  is based on the scribe's personal observations and the provider's statements to them.   I have reviewed the above documentation for accuracy and completeness, and I agree with the above.

## 2018-03-23 ENCOUNTER — Telehealth: Payer: Self-pay | Admitting: Oncology

## 2018-03-23 ENCOUNTER — Encounter: Payer: Self-pay | Admitting: Physician Assistant

## 2018-03-23 ENCOUNTER — Encounter: Payer: Self-pay | Admitting: Physical Therapy

## 2018-03-23 ENCOUNTER — Inpatient Hospital Stay: Payer: PPO

## 2018-03-23 ENCOUNTER — Inpatient Hospital Stay: Payer: PPO | Attending: Oncology

## 2018-03-23 ENCOUNTER — Other Ambulatory Visit: Payer: Self-pay

## 2018-03-23 ENCOUNTER — Inpatient Hospital Stay (HOSPITAL_BASED_OUTPATIENT_CLINIC_OR_DEPARTMENT_OTHER): Payer: PPO | Admitting: Oncology

## 2018-03-23 ENCOUNTER — Ambulatory Visit: Payer: PPO | Attending: General Surgery | Admitting: Physical Therapy

## 2018-03-23 VITALS — BP 142/87 | HR 93 | Temp 98.4°F | Resp 18 | Ht 65.0 in | Wt 175.2 lb

## 2018-03-23 DIAGNOSIS — M6281 Muscle weakness (generalized): Secondary | ICD-10-CM | POA: Diagnosis not present

## 2018-03-23 DIAGNOSIS — Z87891 Personal history of nicotine dependence: Secondary | ICD-10-CM | POA: Insufficient documentation

## 2018-03-23 DIAGNOSIS — M25612 Stiffness of left shoulder, not elsewhere classified: Secondary | ICD-10-CM | POA: Insufficient documentation

## 2018-03-23 DIAGNOSIS — R530 Neoplastic (malignant) related fatigue: Secondary | ICD-10-CM | POA: Insufficient documentation

## 2018-03-23 DIAGNOSIS — Z170 Estrogen receptor positive status [ER+]: Secondary | ICD-10-CM | POA: Insufficient documentation

## 2018-03-23 DIAGNOSIS — C50912 Malignant neoplasm of unspecified site of left female breast: Secondary | ICD-10-CM | POA: Insufficient documentation

## 2018-03-23 DIAGNOSIS — Z483 Aftercare following surgery for neoplasm: Secondary | ICD-10-CM | POA: Diagnosis not present

## 2018-03-23 DIAGNOSIS — K219 Gastro-esophageal reflux disease without esophagitis: Secondary | ICD-10-CM

## 2018-03-23 DIAGNOSIS — Z9013 Acquired absence of bilateral breasts and nipples: Secondary | ICD-10-CM | POA: Diagnosis not present

## 2018-03-23 DIAGNOSIS — R222 Localized swelling, mass and lump, trunk: Secondary | ICD-10-CM | POA: Diagnosis not present

## 2018-03-23 DIAGNOSIS — Z5111 Encounter for antineoplastic chemotherapy: Secondary | ICD-10-CM | POA: Insufficient documentation

## 2018-03-23 DIAGNOSIS — M79602 Pain in left arm: Secondary | ICD-10-CM

## 2018-03-23 DIAGNOSIS — M25611 Stiffness of right shoulder, not elsewhere classified: Secondary | ICD-10-CM

## 2018-03-23 DIAGNOSIS — C50411 Malignant neoplasm of upper-outer quadrant of right female breast: Secondary | ICD-10-CM | POA: Diagnosis not present

## 2018-03-23 DIAGNOSIS — K2970 Gastritis, unspecified, without bleeding: Secondary | ICD-10-CM | POA: Insufficient documentation

## 2018-03-23 DIAGNOSIS — D759 Disease of blood and blood-forming organs, unspecified: Secondary | ICD-10-CM | POA: Diagnosis not present

## 2018-03-23 DIAGNOSIS — C50812 Malignant neoplasm of overlapping sites of left female breast: Secondary | ICD-10-CM

## 2018-03-23 LAB — CBC WITH DIFFERENTIAL (CANCER CENTER ONLY)
BASOS ABS: 0 10*3/uL (ref 0.0–0.1)
Basophils Relative: 1 %
Eosinophils Absolute: 0 10*3/uL (ref 0.0–0.5)
Eosinophils Relative: 1 %
HEMATOCRIT: 30.4 % — AB (ref 34.8–46.6)
HEMOGLOBIN: 10.5 g/dL — AB (ref 11.6–15.9)
LYMPHS ABS: 0.2 10*3/uL — AB (ref 0.9–3.3)
LYMPHS PCT: 19 %
MCH: 30.8 pg (ref 25.1–34.0)
MCHC: 34.5 g/dL (ref 31.5–36.0)
MCV: 89.1 fL (ref 79.5–101.0)
Monocytes Absolute: 0.1 10*3/uL (ref 0.1–0.9)
Monocytes Relative: 6 %
NEUTROS ABS: 0.8 10*3/uL — AB (ref 1.5–6.5)
NEUTROS PCT: 73 %
Platelet Count: 203 10*3/uL (ref 145–400)
RBC: 3.41 MIL/uL — AB (ref 3.70–5.45)
RDW: 14.3 % (ref 11.2–14.5)
WBC: 1.1 10*3/uL — AB (ref 3.9–10.3)

## 2018-03-23 MED ORDER — OMEPRAZOLE 40 MG PO CPDR: 40.0000 mg | DELAYED_RELEASE_CAPSULE | Freq: Every day | ORAL | 1 refills | Status: DC

## 2018-03-23 NOTE — Therapy (Signed)
[place], [place], [address] Phone: [phone]   Fax:  [phone]  Physical Therapy Treatment  Patient Details  Name: Samantha Sullivan MRN: [id number] Date of Birth: 19-Sep-1951 Referring Provider: Dr. Aurelio Jew    Encounter Date: 03/23/2018  PT End of Session - 03/23/18 1447    Visit Number  11    Number of Visits  25    Date for PT Re-Evaluation  05/04/18    PT Start Time  1350    PT Stop Time  1435    PT Time Calculation (min)  45 min    Activity Tolerance  Patient tolerated treatment well    Behavior During Therapy  [facility] for tasks assessed/performed       Past Medical History:  Diagnosis Date  . Abnormal CT of the chest 12/06   numeroous lung nodules - most have resolved.  . Arthritis    psoriatic arthritis- toes & feet, but resolved at this point- no need meds at this point    . Cancer [facility])    right breast cancer  . Complication of anesthesia    /w colonoscopy- woke up before procedure was complete  . Family history of adverse reaction to anesthesia    pt's mother reported difficulty being given " enough " medicine to outcome a successful anesth. experience  . Family history of leukemia   . Family history of ovarian cancer   . History of meningioma 2007   in 2013 stable on MRI and recheck in 5 years, no surgery necessry   . HSV infection 1970's- 1980's  . Psoriatic arthritis [facility])     Past Surgical History:  Procedure Laterality Date  . BILATERAL TOTAL MASTECTOMY WITH AXILLARY LYMPH NODE DISSECTION Bilateral 01/04/2018   Procedure: BILATERAL TOTAL MASTECTOMY PROPHYLATIC RIGHT  WITH LEFT SENTINEAL  LYMPH NODE;  Surgeon: Excell Seltzer, MD;  Location: [place];  Service: General;  Laterality: Bilateral;  . BREAST LUMPECTOMY WITH RADIOACTIVE SEED AND SENTINEL LYMPH NODE BIOPSY Right 01/22/2016   Procedure: RIGHT BREAST LUMPECTOMY WITH RADIOACTIVE SEED AND SENTINEL LYMPH NODE BIOPSY;   Surgeon: Excell Seltzer, MD;  Location: [place];  Service: General;  Laterality: Right;  . BREAST SURGERY Left 1971, 2016   lumpectomy benign, 2016- Br. Ca- R lumpectomy & radiation   . COLONOSCOPY  8/06   normal and recheck in 10 years  . HAMMER TOE SURGERY Left 2016  . MASTECTOMY Right 01/04/2018   PROPHYLATIC   . MASTECTOMY COMPLETE / SIMPLE W/ SENTINEL NODE BIOPSY Left 01/04/2018  . PORTACATH PLACEMENT Right 01/04/2018   Procedure: INSERTION PORT-A-CATH;  Surgeon: Excell Seltzer, MD;  Location: [place];  Service: General;  Laterality: Right;  . THYROIDECTOMY, PARTIAL Left 1983    There were no vitals filed for this visit.  Subjective Assessment - 03/23/18 1401    Subjective  I think I'm ready to get back to my normal self and quit wearing all these pads in my bra anymore.    Pertinent History  right breast cancer in 2017 with lumpectomy , sentinel node biopsy and radiation ( no chemo) left breast cancer in Dec. 26 on regular mammogram and had bilataral mastectomy with left node biospy (3 nodes ) on Jan 04 2018 drains removed 2 1/2 weeks later . has a port on her right side and plans to have 5 months of chemo but no radiation     Patient Stated Goals  get back to the normal "me"  Currently in Pain?  No/denies                       [facility] Adult PT Treatment/Exercise - 03/23/18 0001      Manual Therapy   Manual Therapy  Manual Lymphatic Drainage (MLD);Passive ROM;Neural Stretch;Myofascial release    Myofascial Release  To left medial upper arm and anterior elbow to try to reduce cording    Manual Lymphatic Drainage (MLD)  In Supine: Short neck, superficial and deep abdominals, Rt inguinal and Rt axillo-inguinal anstomosis and upper arm sequence and anterior upper anterior chest near axilla at swelling, then same to Rt side but only to anastomosis.    Passive ROM  To Lt shoulder into flexion, abduction and D2 to pts tolerance                      Long Term Clinic Goals - 03/04/18 1522      CC Long Term Goal  #1   Title  Pt will report a decrease in pain in left arm by 50%    Baseline  pt continues to have pain in left arm and abdomen from cording but it is less than 50%    Status  Achieved      CC Long Term Goal  #2   Title  Pt will have increased abduction of both shoulder to 140 degrees so that she can perform self care and household chores easier at home     Baseline  goal met for right arm on 02/09/2018 , and for left arm on 02/23/2018    Status  Achieved      CC Long Term Goal  #3   Title  Pt will verbalize and understading of lymphedema risk reduction and be able to manage her post op swelling at home     Baseline  has compression sleeve and bra     Status  Achieved      CC Long Term Goal  #4   Title  Pt will be independent in a home exercise program     Baseline  needs to be updated to strengthening     Status  On-going      CC Long Term Goal  #5   Title  Pt will have reduction in cording and abdomen by 50%    Time  4    Period  Weeks    Status  New         Plan - 03/23/18 1447    Clinical Impression Statement  Palpable and visible knots and cording still visible on medial left upper arm and anterior elbow but she felt a cord release today during manual treatment. Her chest appears fairly flat with some visible edema still present just inferior to her port-a-cath on her right side and she plans to f/u with her surgeon about that. It appears she is ready to be fitted for prostheses and stop wearing all her compression foams.    Rehab Potential  Good    PT Treatment/Interventions  ADLs/Self Care Home Management;DME Instruction;Scar mobilization;Passive range of motion;Neuromuscular re-education;Therapeutic activities;Therapeutic exercise;Orthotic Fit/Training;Patient/family education;Compression bandaging;Manual lymph drainage;Manual techniques;Taping    PT Next Visit Plan  Review  [place] and cont scapular work; continue manual techniques and reassess    Consulted and Agree with Plan of Care  Patient       Patient will benefit from skilled therapeutic intervention in order to improve the following deficits and impairments:  Decreased  knowledge of use of DME, Decreased skin integrity, Increased fascial restricitons, Pain, Postural dysfunction, Decreased scar mobility, Decreased range of motion, Decreased strength, Impaired UE functional use, Increased edema, Decreased knowledge of precautions  Visit Diagnosis: Aftercare following surgery for neoplasm  Stiffness of right shoulder, not elsewhere classified  Stiffness of left shoulder, not elsewhere classified  Pain in left arm  Muscle weakness (generalized)  Localized swelling, mass and lump, trunk     Problem List Patient Active Problem List   Diagnosis Date Noted  . Port-A-Cath in place 02/16/2018  . Stage I breast cancer, left ([place]) 01/04/2018  . Malignant neoplasm of overlapping sites of left breast ([place]) 12/26/2017  . Genetic testing 02/26/2016  . Family history of ovarian cancer   . Family history of leukemia   . Psoriatic arthritis ([place]) 01/15/2016  . Meningioma ([place]) 01/15/2016  . Malignant neoplasm of upper-outer quadrant of right breast in female, estrogen receptor positive ([place]) 01/04/2016    Annia Friendly, PT 03/23/18 2:50 PM  [place], [place], [address] Phone: [phone]   Fax:  [phone]  Name: Ciella Obi MRN: [id number] Date of Birth: 04-Aug-1951

## 2018-03-23 NOTE — Telephone Encounter (Signed)
Gave avs and calendar ° °

## 2018-03-26 ENCOUNTER — Ambulatory Visit: Payer: PPO | Admitting: Physical Therapy

## 2018-03-26 ENCOUNTER — Encounter: Payer: Self-pay | Admitting: Physical Therapy

## 2018-03-26 DIAGNOSIS — R222 Localized swelling, mass and lump, trunk: Secondary | ICD-10-CM

## 2018-03-26 DIAGNOSIS — M6281 Muscle weakness (generalized): Secondary | ICD-10-CM

## 2018-03-26 DIAGNOSIS — Z483 Aftercare following surgery for neoplasm: Secondary | ICD-10-CM

## 2018-03-26 NOTE — Patient Instructions (Signed)
[email or web address] Courses Online Strength After Breast Cancer Look at the right of the page for Lymphedema Education Session  

## 2018-03-26 NOTE — Therapy (Signed)
[place], [place], [address] Phone: [phone]   Fax:  [phone]  Physical Therapy Treatment  Patient Details  Name: Samantha Sullivan MRN: [id number] Date of Birth: 1951-06-15 Referring Provider: Dr. Aurelio Jew    Encounter Date: 03/26/2018  PT End of Session - 03/26/18 1245    Visit Number  12    Number of Visits  25    Date for PT Re-Evaluation  05/04/18    PT Start Time  1115    PT Stop Time  1158    PT Time Calculation (min)  43 min    Activity Tolerance  Patient tolerated treatment well    Behavior During Therapy  [facility] for tasks assessed/performed       Past Medical History:  Diagnosis Date  . Abnormal CT of the chest 12/06   numeroous lung nodules - most have resolved.  . Arthritis    psoriatic arthritis- toes & feet, but resolved at this point- no need meds at this point    . Cancer [facility])    right breast cancer  . Complication of anesthesia    /w colonoscopy- woke up before procedure was complete  . Family history of adverse reaction to anesthesia    pt's mother reported difficulty being given " enough " medicine to outcome a successful anesth. experience  . Family history of leukemia   . Family history of ovarian cancer   . History of meningioma 2007   in 2013 stable on MRI and recheck in 5 years, no surgery necessry   . HSV infection 1970's- 1980's  . Psoriatic arthritis [facility])     Past Surgical History:  Procedure Laterality Date  . BILATERAL TOTAL MASTECTOMY WITH AXILLARY LYMPH NODE DISSECTION Bilateral 01/04/2018   Procedure: BILATERAL TOTAL MASTECTOMY PROPHYLATIC RIGHT  WITH LEFT SENTINEAL  LYMPH NODE;  Surgeon: Excell Seltzer, MD;  Location: [place];  Service: General;  Laterality: Bilateral;  . BREAST LUMPECTOMY WITH RADIOACTIVE SEED AND SENTINEL LYMPH NODE BIOPSY Right 01/22/2016   Procedure: RIGHT BREAST LUMPECTOMY WITH RADIOACTIVE SEED AND SENTINEL LYMPH NODE BIOPSY;   Surgeon: Excell Seltzer, MD;  Location: [place];  Service: General;  Laterality: Right;  . BREAST SURGERY Left 1971, 2016   lumpectomy benign, 2016- Br. Ca- R lumpectomy & radiation   . COLONOSCOPY  8/06   normal and recheck in 10 years  . HAMMER TOE SURGERY Left 2016  . MASTECTOMY Right 01/04/2018   PROPHYLATIC   . MASTECTOMY COMPLETE / SIMPLE W/ SENTINEL NODE BIOPSY Left 01/04/2018  . PORTACATH PLACEMENT Right 01/04/2018   Procedure: INSERTION PORT-A-CATH;  Surgeon: Excell Seltzer, MD;  Location: [place];  Service: General;  Laterality: Right;  . THYROIDECTOMY, PARTIAL Left 1983    There were no vitals filed for this visit.  Subjective Assessment - 03/26/18 1241    Subjective  Pt has not been wearing her compression pads and is doing fine.  She still has some fullness at right chest below port and may ask her surgeon about aspiration when she sees him next week     Pertinent History  right breast cancer in 2017 with lumpectomy , sentinel node biopsy and radiation ( no chemo) left breast cancer in Dec. 26 on regular mammogram and had bilataral mastectomy with left node biospy (3 nodes ) on Jan 04 2018 drains removed 2 1/2 weeks later . has a port on her right side and plans to have 5 months of chemo but  no radiation     Patient Stated Goals  get back to the normal "me"     Currently in Pain?  No/denies                       [facility] Adult PT Treatment/Exercise - 03/26/18 0001      Exercises   Exercises  Other Exercises    Other Exercises   Pt instructed in basics and exercises of Strength ABC program and practices stretching and core sequences and each of the exercises with substitutions for crunch and dead lift with clam and standing hip abduction.  Also given web link for online lymphedema risk reduction and handouts for program              PT Education - 03/26/18 1245    Education provided  Yes    Education Details  strength ABC program      Person(s) Educated  Patient    Methods  Explanation    Comprehension  Verbalized understanding             [facility] Goals - 03/04/18 1522      CC Long Term Goal  #1   Title  Pt will report a decrease in pain in left arm by 50%    Baseline  pt continues to have pain in left arm and abdomen from cording but it is less than 50%    Status  Achieved      CC Long Term Goal  #2   Title  Pt will have increased abduction of both shoulder to 140 degrees so that she can perform self care and household chores easier at home     Baseline  goal met for right arm on 02/09/2018 , and for left arm on 02/23/2018    Status  Achieved      CC Long Term Goal  #3   Title  Pt will verbalize and understading of lymphedema risk reduction and be able to manage her post op swelling at home     Baseline  has compression sleeve and bra     Status  Achieved      CC Long Term Goal  #4   Title  Pt will be independent in a home exercise program     Baseline  needs to be updated to strengthening     Status  On-going      CC Long Term Goal  #5   Title  Pt will have reduction in cording and abdomen by 50%    Time  4    Period  Weeks    Status  New         Plan - 03/26/18 1245    Clinical Impression Statement  Pt continues to do very well Cording is still present but she is managing with her compression sleeve.  She was instructed in Strength ABC program today and feels like she is ready to progress to discharge from PT soon     Rehab Potential  Good    Clinical Impairments Affecting Rehab Potential  lymph nodes removed from both axillae    PT Frequency  2x / week    PT Treatment/Interventions  ADLs/Self Care Home Management;DME Instruction;Scar mobilization;Passive range of motion;Neuromuscular re-education;Therapeutic activities;Therapeutic exercise;Orthotic Fit/Training;Patient/family education;Compression bandaging;Manual lymph drainage;Manual techniques;Taping    PT Next Visit Plan  Review  Strength ABC and answer questions, assess goals.  set appt in recheck in 4-6 weeks or as pt would  like     Consulted and Agree with Plan of Care  Patient       Patient will benefit from skilled therapeutic intervention in order to improve the following deficits and impairments:  Decreased knowledge of use of DME, Decreased skin integrity, Increased fascial restricitons, Pain, Postural dysfunction, Decreased scar mobility, Decreased range of motion, Decreased strength, Impaired UE functional use, Increased edema, Decreased knowledge of precautions  Visit Diagnosis: Aftercare following surgery for neoplasm  Muscle weakness (generalized)  Localized swelling, mass and lump, trunk     Problem List Patient Active Problem List   Diagnosis Date Noted  . Port-A-Cath in place 02/16/2018  . Stage I breast cancer, left ([place]) 01/04/2018  . Malignant neoplasm of overlapping sites of left breast ([place]) 12/26/2017  . Genetic testing 02/26/2016  . Family history of ovarian cancer   . Family history of leukemia   . Psoriatic arthritis ([place]) 01/15/2016  . Meningioma ([place]) 01/15/2016  . Malignant neoplasm of upper-outer quadrant of right breast in female, estrogen receptor positive ([place]) 01/04/2016  Donato Heinz. Owens Shark PT   Norwood Levo 03/26/2018, 12:48 PM  [place], [place], [address] Phone: [phone]   Fax:  [phone]  Name: Seneca Gadbois MRN: [id number] Date of Birth: Feb 19, 1951

## 2018-03-30 ENCOUNTER — Inpatient Hospital Stay: Payer: PPO

## 2018-03-30 VITALS — BP 130/73 | HR 86 | Temp 99.0°F | Resp 18

## 2018-03-30 DIAGNOSIS — Z170 Estrogen receptor positive status [ER+]: Secondary | ICD-10-CM

## 2018-03-30 DIAGNOSIS — C50411 Malignant neoplasm of upper-outer quadrant of right female breast: Secondary | ICD-10-CM

## 2018-03-30 DIAGNOSIS — C50812 Malignant neoplasm of overlapping sites of left female breast: Secondary | ICD-10-CM

## 2018-03-30 DIAGNOSIS — Z5111 Encounter for antineoplastic chemotherapy: Secondary | ICD-10-CM | POA: Diagnosis not present

## 2018-03-30 DIAGNOSIS — Z95828 Presence of other vascular implants and grafts: Secondary | ICD-10-CM

## 2018-03-30 LAB — CBC WITH DIFFERENTIAL (CANCER CENTER ONLY)
Basophils Absolute: 0 10*3/uL (ref 0.0–0.1)
Basophils Relative: 0 %
EOS ABS: 0 10*3/uL (ref 0.0–0.5)
EOS PCT: 0 %
HCT: 30.4 % — ABNORMAL LOW (ref 34.8–46.6)
HEMOGLOBIN: 10.3 g/dL — AB (ref 11.6–15.9)
LYMPHS ABS: 0.9 10*3/uL (ref 0.9–3.3)
LYMPHS PCT: 14 %
MCH: 30.5 pg (ref 25.1–34.0)
MCHC: 33.9 g/dL (ref 31.5–36.0)
MCV: 89.9 fL (ref 79.5–101.0)
MONOS PCT: 7 %
Monocytes Absolute: 0.5 10*3/uL (ref 0.1–0.9)
Neutro Abs: 5.4 10*3/uL (ref 1.5–6.5)
Neutrophils Relative %: 79 %
PLATELETS: 291 10*3/uL (ref 145–400)
RBC: 3.38 MIL/uL — ABNORMAL LOW (ref 3.70–5.45)
RDW: 15.9 % — ABNORMAL HIGH (ref 11.2–14.5)
WBC Count: 6.8 10*3/uL (ref 3.9–10.3)

## 2018-03-30 LAB — CMP (CANCER CENTER ONLY)
ALT: 11 U/L (ref 0–55)
AST: 14 U/L (ref 5–34)
Albumin: 3.3 g/dL — ABNORMAL LOW (ref 3.5–5.0)
Alkaline Phosphatase: 126 U/L (ref 40–150)
Anion gap: 8 (ref 3–11)
BUN: 10 mg/dL (ref 7–26)
CHLORIDE: 104 mmol/L (ref 98–109)
CO2: 26 mmol/L (ref 22–29)
CREATININE: 0.7 mg/dL (ref 0.60–1.10)
Calcium: 9.4 mg/dL (ref 8.4–10.4)
GFR, Est AFR Am: >60 mL/min (ref >60)
GFR, Estimated: >60 mL/min (ref >60)
Glucose, Bld: 108 mg/dL (ref 70–140)
POTASSIUM: 3.9 mmol/L (ref 3.5–5.1)
SODIUM: 138 mmol/L (ref 136–145)
Total Bilirubin: 0.4 mg/dL (ref 0.2–1.2)
Total Protein: 6.2 g/dL — ABNORMAL LOW (ref 6.4–8.3)

## 2018-03-30 MED ORDER — PROCHLORPERAZINE MALEATE 10 MG PO TABS
10.0000 mg | ORAL_TABLET | Freq: Once | ORAL | Status: AC
  Administered 2018-03-30: 10 mg via ORAL

## 2018-03-30 MED ORDER — SODIUM CHLORIDE 0.9 % IV SOLN
Freq: Once | INTRAVENOUS | Status: AC
  Administered 2018-03-30: 09:00:00 via INTRAVENOUS

## 2018-03-30 MED ORDER — SODIUM CHLORIDE 0.9% FLUSH
10.0000 mL | INTRAVENOUS | Status: DC | PRN
  Administered 2018-03-30: 10 mL
  Filled 2018-03-30: qty 10

## 2018-03-30 MED ORDER — DOXORUBICIN HCL CHEMO IV INJECTION 2 MG/ML
60.0000 mg/m2 | Freq: Once | INTRAVENOUS | Status: AC
  Administered 2018-03-30: 118 mg via INTRAVENOUS
  Filled 2018-03-30: qty 59

## 2018-03-30 MED ORDER — PEGFILGRASTIM 6 MG/0.6ML ~~LOC~~ PSKT
PREFILLED_SYRINGE | SUBCUTANEOUS | Status: AC
  Filled 2018-03-30: qty 0.6

## 2018-03-30 MED ORDER — SODIUM CHLORIDE 0.9 % IV SOLN
600.0000 mg/m2 | Freq: Once | INTRAVENOUS | Status: AC
  Administered 2018-03-30: 1180 mg via INTRAVENOUS
  Filled 2018-03-30: qty 59

## 2018-03-30 MED ORDER — HEPARIN SOD (PORK) LOCK FLUSH 100 UNIT/ML IV SOLN
500.0000 [IU] | Freq: Once | INTRAVENOUS | Status: AC | PRN
  Administered 2018-03-30: 500 [IU]
  Filled 2018-03-30: qty 5

## 2018-03-30 MED ORDER — SODIUM CHLORIDE 0.9 % IV SOLN
Freq: Once | INTRAVENOUS | Status: AC
  Administered 2018-03-30: 10:00:00 via INTRAVENOUS
  Filled 2018-03-30: qty 5

## 2018-03-30 MED ORDER — PEGFILGRASTIM 6 MG/0.6ML ~~LOC~~ PSKT
6.0000 mg | PREFILLED_SYRINGE | Freq: Once | SUBCUTANEOUS | Status: AC
  Administered 2018-03-30: 6 mg via SUBCUTANEOUS

## 2018-03-30 MED ORDER — PROCHLORPERAZINE MALEATE 10 MG PO TABS
ORAL_TABLET | ORAL | Status: AC
  Filled 2018-03-30: qty 1

## 2018-03-30 MED ORDER — SODIUM CHLORIDE 0.9% FLUSH
10.0000 mL | INTRAVENOUS | Status: DC | PRN
  Filled 2018-03-30: qty 10

## 2018-03-30 NOTE — Patient Instructions (Signed)
[facility] Discharge Instructions for Patients Receiving Chemotherapy  Today you received the following chemotherapy agents Adriamycin, Cytoxan.  To help prevent nausea and vomiting after your treatment, we encourage you to take your nausea medication as prescribed.   If you develop nausea and vomiting that is not controlled by your nausea medication, call the clinic.   BELOW ARE SYMPTOMS THAT SHOULD BE REPORTED IMMEDIATELY:  *FEVER GREATER THAN 100.5 F  *CHILLS WITH OR WITHOUT FEVER  NAUSEA AND VOMITING THAT IS NOT CONTROLLED WITH YOUR NAUSEA MEDICATION  *UNUSUAL SHORTNESS OF BREATH  *UNUSUAL BRUISING OR BLEEDING  TENDERNESS IN MOUTH AND THROAT WITH OR WITHOUT PRESENCE OF ULCERS  *URINARY PROBLEMS  *BOWEL PROBLEMS  UNUSUAL RASH Items with * indicate a potential emergency and should be followed up as soon as possible.  Feel free to call the clinic should you have any questions or concerns. The clinic phone number is [phone].  Please show the CHEMO ALERT CARD at check-in to the Emergency Department and triage nurse.   

## 2018-04-01 ENCOUNTER — Encounter: Payer: Self-pay | Admitting: Physical Therapy

## 2018-04-01 ENCOUNTER — Ambulatory Visit: Payer: PPO | Admitting: Physical Therapy

## 2018-04-01 DIAGNOSIS — Z483 Aftercare following surgery for neoplasm: Secondary | ICD-10-CM | POA: Diagnosis not present

## 2018-04-01 DIAGNOSIS — M25612 Stiffness of left shoulder, not elsewhere classified: Secondary | ICD-10-CM

## 2018-04-01 NOTE — Therapy (Addendum)
[place], [place], [address] Phone: [phone]   Fax:  [phone]  Physical Therapy Treatment  Patient Details  Name: Samantha Sullivan MRN: [id number] Date of Birth: 1951-11-04 Referring Provider: Dr. Aurelio Jew    Encounter Date: 04/01/2018  PT End of Session - 04/01/18 1359    Visit Number  13    Number of Visits  25    Date for PT Re-Evaluation  05/04/18    PT Start Time  1245    PT Stop Time  1347    PT Time Calculation (min)  42 min    Activity Tolerance  Patient tolerated treatment well    Behavior During Therapy  [facility] for tasks assessed/performed       Past Medical History:  Diagnosis Date  . Abnormal CT of the chest 12/06   numeroous lung nodules - most have resolved.  . Arthritis    psoriatic arthritis- toes & feet, but resolved at this point- no need meds at this point    . Cancer [facility])    right breast cancer  . Complication of anesthesia    /w colonoscopy- woke up before procedure was complete  . Family history of adverse reaction to anesthesia    pt's mother reported difficulty being given " enough " medicine to outcome a successful anesth. experience  . Family history of leukemia   . Family history of ovarian cancer   . History of meningioma 2007   in 2013 stable on MRI and recheck in 5 years, no surgery necessry   . HSV infection 1970's- 1980's  . Psoriatic arthritis [facility])     Past Surgical History:  Procedure Laterality Date  . BILATERAL TOTAL MASTECTOMY WITH AXILLARY LYMPH NODE DISSECTION Bilateral 01/04/2018   Procedure: BILATERAL TOTAL MASTECTOMY PROPHYLATIC RIGHT  WITH LEFT SENTINEAL  LYMPH NODE;  Surgeon: Excell Seltzer, MD;  Location: [place];  Service: General;  Laterality: Bilateral;  . BREAST LUMPECTOMY WITH RADIOACTIVE SEED AND SENTINEL LYMPH NODE BIOPSY Right 01/22/2016   Procedure: RIGHT BREAST LUMPECTOMY WITH RADIOACTIVE SEED AND SENTINEL LYMPH NODE BIOPSY;   Surgeon: Excell Seltzer, MD;  Location: [place];  Service: General;  Laterality: Right;  . BREAST SURGERY Left 1971, 2016   lumpectomy benign, 2016- Br. Ca- R lumpectomy & radiation   . COLONOSCOPY  8/06   normal and recheck in 10 years  . HAMMER TOE SURGERY Left 2016  . MASTECTOMY Right 01/04/2018   PROPHYLATIC   . MASTECTOMY COMPLETE / SIMPLE W/ SENTINEL NODE BIOPSY Left 01/04/2018  . PORTACATH PLACEMENT Right 01/04/2018   Procedure: INSERTION PORT-A-CATH;  Surgeon: Excell Seltzer, MD;  Location: [place];  Service: General;  Laterality: Right;  . THYROIDECTOMY, PARTIAL Left 1983    There were no vitals filed for this visit.  Subjective Assessment - 04/01/18 1308    Subjective  The Strength ABC program is going pretty well. I had chemo this week and company. I have been carrying around a 67 year old so I have gotten some exercise. I have a little fluid build up on the right side and they were not able to aspirate it but they are going to do an ultrasound.     Pertinent History  right breast cancer in 2017 with lumpectomy , sentinel node biopsy and radiation ( no chemo) left breast cancer in Dec. 26 on regular mammogram and had bilataral mastectomy with left node biospy (3 nodes ) on Jan 04 2018 drains removed  2 1/2 weeks later . has a port on her right side and plans to have 5 months of chemo but no radiation     Patient Stated Goals  get back to the normal "me"     Currently in Pain?  No/denies    Pain Score  0-No pain                       [facility] Adult PT Treatment/Exercise - 04/01/18 0001      Manual Therapy   Myofascial Release  To left medial upper arm and anterior elbow to try to reduce cording cording appeared less visible at end of session                     [place] - 04/01/18 1314      CC Long Term Goal  #1   Title  Pt will report a decrease in pain in left arm by 50%    Baseline  pt continues to have  pain in left arm and abdomen from cording but it is less than 50%    Time  4    Period  Weeks    Status  Achieved      CC Long Term Goal  #2   Title  Pt will have increased abduction of both shoulder to 140 degrees so that she can perform self care and household chores easier at home     Baseline  goal met for right arm on 02/09/2018 , and for left arm on 02/23/2018    Time  4    Period  Weeks    Status  Achieved      CC Long Term Goal  #3   Title  Pt will verbalize and understading of lymphedema risk reduction and be able to manage her post op swelling at home     Baseline  has compression sleeve and bra     Time  4    Period  Weeks    Status  Achieved      CC Long Term Goal  #4   Title  Pt will be independent in a home exercise program     Baseline  needs to be updated to strengthening , 04/01/18- pt is independent now    Time  4    Period  Weeks    Status  Achieved      CC Long Term Goal  #5   Title  Pt will have reduction in cording and abdomen by 50%    Baseline  04/01/18- 50% improvement and improvement in tightness    Time  4    Period  Weeks    Status  Achieved         Plan - 04/01/18 1357    Clinical Impression Statement  Pt feels she is now independent in her home exercise program. She has met all goals for therapy. Focused today on myofascial to help decrease dimpling from cording and relieve some tightness. At this point pt has met all goals for therapy and will be placed on hold until pt completes her treatments. If she develops any increased tightness, fatigue or swelling she will return.     Rehab Potential  Good    Clinical Impairments Affecting Rehab Potential  lymph nodes removed from both axillae    PT Frequency  2x / week    PT Duration  4 weeks    PT Treatment/Interventions  ADLs/Self  Care Home Management;DME Instruction;Scar mobilization;Passive range of motion;Neuromuscular re-education;Therapeutic activities;Therapeutic exercise;Orthotic  Fit/Training;Patient/family education;Compression bandaging;Manual lymph drainage;Manual techniques;Taping    PT Next Visit Plan  placed on hold for now- pt will call to either be seen or d/c after she completes treatments    PT Home Exercise Plan  supine dowel flexion and abduction , supince scapular series, supine protraciton; [place] and bil UE 3 way raises    Consulted and Agree with Plan of Care  Patient       Patient will benefit from skilled therapeutic intervention in order to improve the following deficits and impairments:  Decreased knowledge of use of DME, Decreased skin integrity, Increased fascial restricitons, Pain, Postural dysfunction, Decreased scar mobility, Decreased range of motion, Decreased strength, Impaired UE functional use, Increased edema, Decreased knowledge of precautions  Visit Diagnosis: Aftercare following surgery for neoplasm  Stiffness of left shoulder, not elsewhere classified     Problem List Patient Active Problem List   Diagnosis Date Noted  . Port-A-Cath in place 02/16/2018  . Stage I breast cancer, left ([place]) 01/04/2018  . Malignant neoplasm of overlapping sites of left breast ([place]) 12/26/2017  . Genetic testing 02/26/2016  . Family history of ovarian cancer   . Family history of leukemia   . Psoriatic arthritis ([place]) 01/15/2016  . Meningioma ([place]) 01/15/2016  . Malignant neoplasm of upper-outer quadrant of right breast in female, estrogen receptor positive ([place]) 01/04/2016    Allyson Sabal [facility] 04/01/2018, 2:00 PM  [place], [place], [address] Phone: [phone]   Fax:  [phone]  Name: Samantha Sullivan MRN: [id number] Date of Birth: [birth year]  Manus Gunning, PT 04/01/18 2:01 PM  PHYSICAL THERAPY DISCHARGE SUMMARY  Visits from Start of Care: 13  Current functional level related to goals / functional outcomes: All goals met    Remaining deficits: none   Education / Equipment: HEP, compression  Plan: Patient agrees to discharge.  Patient goals were met. Patient is being discharged due to not returning since the last visit.  ?????    Allyson Sabal [place], [place] 09/30/18 10:50 AM

## 2018-04-02 ENCOUNTER — Other Ambulatory Visit: Payer: Self-pay | Admitting: General Surgery

## 2018-04-02 DIAGNOSIS — N6489 Other specified disorders of breast: Secondary | ICD-10-CM

## 2018-04-05 NOTE — Progress Notes (Signed)
[place]  [phone] [phone]     ID: [name] DOB: 25-Aug-1951  MR#: [id number]  JYN#:[id number]  Patient Care Team: Shon Baton, MD as PCP - General (Internal Medicine) Magrinat, Virgie Dad, MD as Consulting Physician (Oncology) Kem Boroughs, [place] as Nurse Practitioner (Family Medicine) Regal, Tamala Fothergill, DPM as Consulting Physician (Podiatry) Excell Seltzer, MD as Consulting Physician (General Surgery) Force, Shari Prows, DO as Referring Physician (Student) PCP: Shon Baton, MD OTHER MD:  CHIEF COMPLAINT: Contralateral breast cancer, triple negative  CURRENT TREATMENT: Adjuvant chemotherapy  INTERVAL HISTORY: Samantha Sullivan returns today for follow-up of her triple negative breast cancer. She completed treatment with cycle 4 of 4 planned cycles of cyclophosphamide and doxorubicin on 03/30/2018. Today is day 9 of cycle 4.   She tolerated this treatment well. She noticed an improvement in nausea compared to cycle 3. She had trouble sleeping, but this has improved since not having to take steroids. She took lorazepam, which helped with insomnia. She also felt dry mouth and sensitivity after treatment. She was feeling nauseous on the day of treatment and elected not to have popcicle therapy. She denies oral sores. Her sense of taste is not good.   She begins treatment with weekly paclitaxel x12 on 04/13/2018.  REVIEW OF SYSTEMS: Samantha Sullivan reports that she walks at least 3 miles per day but more likely 5 miles. She denies unusual headaches, visual changes, nausea, vomiting, or dizziness. There has been no unusual cough, phlegm production, or pleurisy. This been no change in bowel or bladder habits. She denies unexplained fatigue or unexplained weight loss, bleeding, rash, or fever. A detailed review of systems was otherwise stable.   RIGHT BREAST CANCER HISTORY:  From the original intake note:  "[name]"noted a lump in her right breast sometime in late  October or early November, but there were "so many things going on in her life" she did not bring it to medical attention until she saw [name] 12/11/2015. [name] was able to palpate a mass at the 9:30 o'clock position in the right breast, which was mobile and nontender. The left breast is status post prior remote biopsy and there was some scar tissue at the 7:00 position. The patient was set up for bilateral diagnostic Mammography with tomosynthesis at [facility] 12/27/2015. This found the breast density to be category C. There was a new oval mass in the right breast upper outer quadrant. Ultrasound the same day confirmed a 2.2 cm mass with irregular margins in the right breast at the 9:00 position read as highly suggestive of malignancy.  Biopsy of the right breast mass in question 01/02/2016 found ([id number]) an invasive ductal carcinoma, grade 1, estrogen receptor 100% positive, progesterone receptor 90% positive, both with strong staining intensity, with an MIB-1 of 10%, and no HER-2 amplification, the signals ratio being 1.32 and the number per cell 2.05. The patient was informed and instructed to stop hormone replacement therapy (last dose 01/04/2016).  Her subsequent history is as detailed below.    PAST MEDICAL HISTORY: Past Medical History:  Diagnosis Date  . Abnormal CT of the chest 12/06   numeroous lung nodules - most have resolved.  . Arthritis    psoriatic arthritis- toes & feet, but resolved at this point- no need meds at this point    . Cancer [facility])    right breast cancer  . Complication of anesthesia    /w colonoscopy- woke up before procedure was complete  . Family history of  adverse reaction to anesthesia    pt's mother reported difficulty being given " enough " medicine to outcome a successful anesth. experience  . Family history of leukemia   . Family history of ovarian cancer   . History of meningioma 03/17/06   in 2012-03-17 stable on MRI and recheck in 5 years, no surgery  necessry   . HSV infection 1970's- 1980's  . Psoriatic arthritis ([place])     PAST SURGICAL HISTORY: Past Surgical History:  Procedure Laterality Date  . BILATERAL TOTAL MASTECTOMY WITH AXILLARY LYMPH NODE DISSECTION Bilateral 01/04/2018   Procedure: BILATERAL TOTAL MASTECTOMY PROPHYLATIC RIGHT  WITH LEFT SENTINEAL  LYMPH NODE;  Surgeon: Excell Seltzer, MD;  Location: [facility];  Service: General;  Laterality: Bilateral;  . BREAST LUMPECTOMY WITH RADIOACTIVE SEED AND SENTINEL LYMPH NODE BIOPSY Right 01/22/2016   Procedure: RIGHT BREAST LUMPECTOMY WITH RADIOACTIVE SEED AND SENTINEL LYMPH NODE BIOPSY;  Surgeon: Excell Seltzer, MD;  Location: [facility];  Service: General;  Laterality: Right;  . BREAST SURGERY Left 1971, Mar 18, 2015   lumpectomy benign, 03/18/15- [facility]- R lumpectomy & radiation   . COLONOSCOPY  8/06   normal and recheck in 10 years  . HAMMER TOE SURGERY Left 2015-03-18  . MASTECTOMY Right 01/04/2018   PROPHYLATIC   . MASTECTOMY COMPLETE / SIMPLE W/ SENTINEL NODE BIOPSY Left 01/04/2018  . PORTACATH PLACEMENT Right 01/04/2018   Procedure: INSERTION PORT-A-CATH;  Surgeon: Excell Seltzer, MD;  Location: [facility];  Service: General;  Laterality: Right;  . THYROIDECTOMY, PARTIAL Left 1983    FAMILY HISTORY Family History  Problem Relation Age of Onset  . Osteoporosis Mother   . Osteoporosis Sister   . Lung cancer Maternal Aunt        smoker  . Heart Problems Maternal Grandmother   . COPD Maternal Grandfather   . Neurodegenerative disease Maternal Aunt        corical-ganglion degeneration  . Ovarian cancer Maternal Aunt        dx in her 19s  . Leukemia Cousin        74s-40s; maternal cousin  [name] has essentially no information on her father's side of the family aside from the fact that he died in his 54s from heart disease. He had psoriasis. The patient's mother passed away 2017/03/17 age 50, due to breast cancer. The patient had 2 brothers who died from congenital heart  disease shortly after birth. The patient has one sister. The patient's mother had one sister, the patient's aunt, diagnosed with ovarian cancer in her 36s.  GYNECOLOGIC HISTORY:  Patient's last menstrual period was 03/22/2000 (approximate). Menarche age 9, first live birth age 25. The patient is GX P2. She went through menopause approximately 17-Mar-1989 but was taking hormone replacement until 01/04/2016. Since coming off replacement she has had insomnia but no hot flashes or vaginal dryness pleural bones so 4. 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 She is divorced and lives alone, with no pets. Her daughter [name] lives in [place]. She is disabled secondary to schizoaffective disorder. Daughter [name] lives in [place] and she is a Estate agent but mostly a homemaker. The patient has 3 grandchildren, the older 5, the other 2 being under 4 years old. The patient attends [employer]. Her mother passed away in 03/17/17 due to breast cancer.  ADVANCED DIRECTIVES: her daughter [name] is her healthcare power of attorney. She can be reached at [place]: Social History   Tobacco Use  . Smoking status: Former Smoker    Packs/day: 1.00    Years: 12.00    Pack years: 12.00    Types: Cigarettes    Last attempt to quit: 07/23/1981    Years since quitting: 36.7  . Smokeless tobacco: Never Used  Substance Use Topics  . Alcohol use: Yes    Alcohol/week: 0.0 oz    Comment: social occasion only- special   . Drug use: No     Colonoscopy: ; [facility]  OMV:[facility] 2016/[facility]  Bone density:rJanuary 2018/normal   Lipid panel:  Allergies  Allergen Reactions  . Bee Venom   . Ciprofloxacin   . Levaquin [Levofloxacin In D5w]   . Norco [Hydrocodone-Acetaminophen] Other (See  Comments)    Pt states Norco made her extremely hyper after her surgery and she was not able to sleep.    Current Outpatient Medications  Medication Sig Dispense Refill  . FLURANDRENOLIDE EX Apply 1 application topically 2 (two) times daily as needed (for psoriasis). Cordran Tape    . folic acid (FOLVITE) 1 MG tablet Take 2 mg by mouth every evening.    . lidocaine-prilocaine (EMLA) cream Apply to affected area once 30 g 3  . LORazepam (ATIVAN) 0.5 MG tablet Take 1 tablet (0.5 mg total) by mouth at bedtime as needed and may repeat dose one time if needed (Nausea or vomiting). 10 tablet 0  . omeprazole (PRILOSEC) 40 MG capsule Take 1 capsule (40 mg total) by mouth daily. 30 capsule 1  . Polyethyl Glycol-Propyl Glycol (LUBRICANT EYE DROPS) 0.4-0.3 % SOLN Place 1-2 drops into both eyes 3 (three) times daily as needed (dry eyes/irritated eyes.).    Marland Kitchen prochlorperazine (COMPAZINE) 10 MG tablet Take 1 tablet (10 mg total) by mouth every 6 (six) hours as needed (Nausea or vomiting). 30 tablet 1  . traMADol (ULTRAM) 50 MG tablet Take 1 tablet (50 mg total) by mouth every 6 (six) hours as needed (mild pain). 20 tablet 0   Current Facility-Administered Medications  Medication Dose Route Frequency Provider Last Rate Last Dose  . triamcinolone acetonide (KENALOG) 10 MG/ML injection 10 mg  10 mg Other Once Wallene Huh, DPM        OBJECTIVE: middle-aged white woman who appears stated age  There were no vitals filed for this visit.   There is no height or weight on file to calculate BMI.      ECOG FS:1 - Symptomatic but completely ambulatory  Sclerae unicteric, pupils round and equal No cervical or supraclavicular adenopathy Lungs no rales or rhonchi Heart regular rate and rhythm Abd soft, nontender, positive bowel sounds MSK no focal spinal tenderness, no upper extremity lymphedema Neuro: nonfocal, well oriented, appropriate affect Breasts: Status post bilateral mastectomies.  There is no  evidence of local recurrence.  Both axillae are benign.  LAB RESULTS:  CMP     Component Value Date/Time   NA 138 03/30/2018 0811   NA 141 08/05/2017 1335   K 3.9 03/30/2018 0811   K 4.5 08/05/2017 1335   CL 104 03/30/2018 0811   CO2 26 03/30/2018 0811   CO2 29 08/05/2017 1335   GLUCOSE 108 03/30/2018 0811   GLUCOSE 77 08/05/2017 1335   BUN 10 03/30/2018 0811   BUN 13.4 08/05/2017 1335   CREATININE 0.70 03/30/2018 0811   CREATININE 0.8 08/05/2017 1335  CALCIUM 9.4 03/30/2018 0811   CALCIUM 10.0 08/05/2017 1335   PROT 6.2 (L) 03/30/2018 0811   PROT 6.8 08/05/2017 1335   ALBUMIN 3.3 (L) 03/30/2018 0811   ALBUMIN 3.6 08/05/2017 1335   AST 14 03/30/2018 0811   AST 19 08/05/2017 1335   ALT 11 03/30/2018 0811   ALT 12 08/05/2017 1335   ALKPHOS 126 03/30/2018 0811   ALKPHOS 169 (H) 08/05/2017 1335   BILITOT 0.4 03/30/2018 0811   BILITOT 0.63 08/05/2017 1335   GFRNONAA >60 03/30/2018 0811   GFRAA >60 03/30/2018 0811    INo results found for: SPEP, UPEP  Lab Results  Component Value Date   WBC 0.3 (LL) 04/07/2018   NEUTROABS 0.2 (LL) 04/07/2018   HGB 13.1 01/05/2018   HCT 25.6 (L) 04/07/2018   MCV 88.6 04/07/2018   PLT 156 04/07/2018      Chemistry      Component Value Date/Time   NA 138 03/30/2018 0811   NA 141 08/05/2017 1335   K 3.9 03/30/2018 0811   K 4.5 08/05/2017 1335   CL 104 03/30/2018 0811   CO2 26 03/30/2018 0811   CO2 29 08/05/2017 1335   BUN 10 03/30/2018 0811   BUN 13.4 08/05/2017 1335   CREATININE 0.70 03/30/2018 0811   CREATININE 0.8 08/05/2017 1335      Component Value Date/Time   CALCIUM 9.4 03/30/2018 0811   CALCIUM 10.0 08/05/2017 1335   ALKPHOS 126 03/30/2018 0811   ALKPHOS 169 (H) 08/05/2017 1335   AST 14 03/30/2018 0811   AST 19 08/05/2017 1335   ALT 11 03/30/2018 0811   ALT 12 08/05/2017 1335   BILITOT 0.4 03/30/2018 0811   BILITOT 0.63 08/05/2017 1335       No results found for: LABCA2  No components found for:  LABCA125  No results for input(s): INR in the last 168 hours.  Urinalysis    Component Value Date/Time   BILIRUBINUR neg 12/11/2015 0925   PROTEINUR neg 12/11/2015 0925   UROBILINOGEN negative 12/11/2015 0925   NITRITE neg 12/11/2015 0925   LEUKOCYTESUR Negative 12/11/2015 0925    STUDIES: No results found.  RESEARCH: enrolled in UPBEAT study; not a candidate for BR 003 because of concurrent cancer  ASSESSMENT: 67 y.o. [place] woman status post right breast upper outer quadrant biopsy 01/02/2016 for a clinical T2 N0, stage IIA invasive ductal carcinoma, grade 1, estrogen and progesterone receptor positive, HER-2 not amplified, with an MIB-1 of 10%.  (1). Right lumpectomy and sentinel lymph node sampling 01/22/2016 showed a pT1c pN0, stage IA invasive ductal carcinoma, grade 1, with close but negative margins. Repeat HER-2 testing was again not amplified   (3) Oncotype score of 18 predicts a risk of recurrence outside the breast within the next 10 years of 11% if the patient's only systemic therapy is tamoxifen for 5 years. It also predicts no significant benefit from chemotherapy.     (4) adjuvant radiation3/14/17-4/11/17 Site/dose:   Right breast - 42.72 gy at 2.67 Gy per fraction x 16 fractions followed by an electron boost to the right breast with 10 Gy at 2 Gy per fraction x 5 fractions.    (5)  Anastrozole started 04/21/2016, discontinued after approximately 1 month with significant side effects  (a)  Bone density at [facility] September 2013 was normal  (b) bone density January 2018 was normal  (c) anastrozole restarted February 2018, held again at the start of chemotherapy February 2019  (6) genetics testing  02/24/2016  through the Breast/Ovarian cancer gene panel offered by [facility] found no deleterious mutations in ATM, BARD1, BRCA1, BRCA2, BRIP1, CDH1, CHEK2, EPCAM, FANCC, MLH1, MSH2, MSH6, NBN, PALB2, PMS2, PTEN, RAD51C, RAD51D, TP53, and XRCC2.   (7) left breast  biopsy 12/17/2017 showed a cT2 cN0, stage IIA- IIB invasive ductal carcinoma, grade 2, estrogen receptor weakly positive, progesterone receptor negative, with HER-2 equivocal by FISH, negative by immunohistochemistry  (8) status post bilateral mastectomies and left sentinel lymph node sampling 01/04/2018 showing  (a) on the right, no evidence of disease  (b) on the left, a pT2 pN0, stage IIB invasive ductal carcinoma, grade 3, triple negative  (9) adjuvant chemotherapy consisting of cyclophosphamide and doxorubicin in dose dense fashion x4 started 02/16/2018, completed 03/30/2018,  followed by weekly paclitaxel /carboplatin x12 starting 04/13/2018    PLAN: Samantha Sullivan tolerated her first for cycle of chemotherapy, the more intense cycles, without any unusual side effects.  She is again cytopenic and I am giving her Keflex to take for the next 5 days.  She is aware of the need to call us if she develops a fever.  She derived significant benefit symptomatically from acupuncture.  I was glad to write her a prescription in case her insurance covers that.  Also I have referred her to physical therapy for consideration of dry needling, which is similar and may be even more effective.  She is meeting with our chemotherapy teaching nurse today to discuss the [place] and Taxol treatments that are to start next week but we also reviewed the possible toxicity side effects and complications today, with particular attention to the possibility of neuropathy.  I refilled her lorazepam since she will receive Decadron with premeds on the first 2 cycles particularly.  After that it will be down to 4 mg and that should not affect her sleep.  I commended her excellent exercise program.  She will return next week for her first carboplatin and paclitaxel treatment and then 2 weeks later for cycle 2.  I will see her with cycle 2 to troubleshoot problems  She knows to call for any issues that may develop before the next  visit.     Magrinat, Virgie Dad, MD  04/07/18 3:23 PM Medical Oncology and Hematology [facility] [address] [place], [place] [address] Tel. [phone]    Fax. [phone]  This document serves as a record of services personally performed by Lurline Del, MD. It was created on his behalf by Sheron Nightingale, a trained medical scribe. The creation of this record is based on the scribe's personal observations and the provider's statements to them.   I have reviewed the above documentation for accuracy and completeness, and I agree with the above.

## 2018-04-07 ENCOUNTER — Inpatient Hospital Stay (HOSPITAL_BASED_OUTPATIENT_CLINIC_OR_DEPARTMENT_OTHER): Payer: PPO | Admitting: Oncology

## 2018-04-07 ENCOUNTER — Inpatient Hospital Stay: Payer: PPO

## 2018-04-07 DIAGNOSIS — Z170 Estrogen receptor positive status [ER+]: Principal | ICD-10-CM

## 2018-04-07 DIAGNOSIS — Z95828 Presence of other vascular implants and grafts: Secondary | ICD-10-CM

## 2018-04-07 DIAGNOSIS — Z9013 Acquired absence of bilateral breasts and nipples: Secondary | ICD-10-CM

## 2018-04-07 DIAGNOSIS — C50411 Malignant neoplasm of upper-outer quadrant of right female breast: Secondary | ICD-10-CM

## 2018-04-07 DIAGNOSIS — C50812 Malignant neoplasm of overlapping sites of left female breast: Secondary | ICD-10-CM

## 2018-04-07 DIAGNOSIS — D759 Disease of blood and blood-forming organs, unspecified: Secondary | ICD-10-CM

## 2018-04-07 DIAGNOSIS — Z87891 Personal history of nicotine dependence: Secondary | ICD-10-CM | POA: Diagnosis not present

## 2018-04-07 DIAGNOSIS — C50912 Malignant neoplasm of unspecified site of left female breast: Secondary | ICD-10-CM

## 2018-04-07 DIAGNOSIS — Z5111 Encounter for antineoplastic chemotherapy: Secondary | ICD-10-CM | POA: Diagnosis not present

## 2018-04-07 LAB — CBC WITH DIFFERENTIAL (CANCER CENTER ONLY)
BAND NEUTROPHILS: 16 %
BASOS PCT: 3 %
Basophils Absolute: 0 10*3/uL (ref 0.0–0.1)
Blasts: 0 %
EOS PCT: 3 %
Eosinophils Absolute: 0 10*3/uL (ref 0.0–0.5)
HCT: 25.6 % — ABNORMAL LOW (ref 34.8–46.6)
Hemoglobin: 8.8 g/dL — ABNORMAL LOW (ref 11.6–15.9)
LYMPHS ABS: 0.1 10*3/uL — AB (ref 0.9–3.3)
LYMPHS PCT: 44 %
MCH: 30.4 pg (ref 25.1–34.0)
MCHC: 34.4 g/dL (ref 31.5–36.0)
MCV: 88.6 fL (ref 79.5–101.0)
MONO ABS: 0 10*3/uL — AB (ref 0.1–0.9)
MONOS PCT: 11 %
Metamyelocytes Relative: 0 %
Myelocytes: 0 %
NEUTROS ABS: 0.2 10*3/uL — AB (ref 1.5–6.5)
Neutrophils Relative %: 23 %
OTHER: 0 %
PLATELETS: 156 10*3/uL (ref 145–400)
Promyelocytes Relative: 0 %
RBC: 2.89 MIL/uL — ABNORMAL LOW (ref 3.70–5.45)
RDW: 15.4 % — AB (ref 11.2–14.5)
WBC Count: 0.3 10*3/uL — CL (ref 3.9–10.3)
nRBC: 0 /100 WBC

## 2018-04-07 MED ORDER — LORAZEPAM 0.5 MG PO TABS: 0.5000 mg | ORAL_TABLET | Freq: Every evening | ORAL | 0 refills | Status: DC | PRN

## 2018-04-07 MED ORDER — HEPARIN SOD (PORK) LOCK FLUSH 100 UNIT/ML IV SOLN
500.0000 [IU] | Freq: Once | INTRAVENOUS | Status: AC | PRN
  Administered 2018-04-07: 500 [IU]
  Filled 2018-04-07: qty 5

## 2018-04-07 MED ORDER — SODIUM CHLORIDE 0.9% FLUSH
10.0000 mL | INTRAVENOUS | Status: DC | PRN
  Administered 2018-04-07: 10 mL
  Filled 2018-04-07: qty 10

## 2018-04-08 ENCOUNTER — Ambulatory Visit
Admission: RE | Admit: 2018-04-08 | Discharge: 2018-04-08 | Disposition: A | Discharge status: Home / Self Care | Payer: PPO | Source: Ambulatory Visit | Attending: General Surgery | Admitting: General Surgery

## 2018-04-08 DIAGNOSIS — N6489 Other specified disorders of breast: Secondary | ICD-10-CM

## 2018-04-13 ENCOUNTER — Other Ambulatory Visit: Payer: Self-pay | Admitting: Oncology

## 2018-04-13 ENCOUNTER — Inpatient Hospital Stay: Payer: PPO

## 2018-04-13 VITALS — BP 114/63 | HR 85 | Temp 98.6°F | Resp 18

## 2018-04-13 DIAGNOSIS — C50812 Malignant neoplasm of overlapping sites of left female breast: Secondary | ICD-10-CM

## 2018-04-13 DIAGNOSIS — Z170 Estrogen receptor positive status [ER+]: Principal | ICD-10-CM

## 2018-04-13 DIAGNOSIS — C50411 Malignant neoplasm of upper-outer quadrant of right female breast: Secondary | ICD-10-CM

## 2018-04-13 DIAGNOSIS — Z5111 Encounter for antineoplastic chemotherapy: Secondary | ICD-10-CM | POA: Diagnosis not present

## 2018-04-13 LAB — CBC WITH DIFFERENTIAL (CANCER CENTER ONLY)
BASOS ABS: 0 10*3/uL (ref 0.0–0.1)
Basophils Relative: 0 %
Eosinophils Absolute: 0 10*3/uL (ref 0.0–0.5)
Eosinophils Relative: 0 %
HEMATOCRIT: 26.7 % — AB (ref 34.8–46.6)
HEMOGLOBIN: 9.1 g/dL — AB (ref 11.6–15.9)
LYMPHS ABS: 0.6 10*3/uL — AB (ref 0.9–3.3)
LYMPHS PCT: 12 %
MCH: 30.1 pg (ref 25.1–34.0)
MCHC: 34.1 g/dL (ref 31.5–36.0)
MCV: 88.4 fL (ref 79.5–101.0)
Monocytes Absolute: 0.4 10*3/uL (ref 0.1–0.9)
Monocytes Relative: 8 %
Neutro Abs: 3.9 10*3/uL (ref 1.5–6.5)
Neutrophils Relative %: 80 %
PLATELETS: 266 10*3/uL (ref 145–400)
RBC: 3.02 MIL/uL — AB (ref 3.70–5.45)
RDW: 16.7 % — ABNORMAL HIGH (ref 11.2–14.5)
WBC: 4.8 10*3/uL (ref 3.9–10.3)

## 2018-04-13 LAB — CMP (CANCER CENTER ONLY)
ALBUMIN: 3.1 g/dL — AB (ref 3.5–5.0)
ALT: 19 U/L (ref 0–55)
ANION GAP: 9 (ref 3–11)
AST: 15 U/L (ref 5–34)
Alkaline Phosphatase: 109 U/L (ref 40–150)
BILIRUBIN TOTAL: 0.5 mg/dL (ref 0.2–1.2)
BUN: 8 mg/dL (ref 7–26)
CHLORIDE: 99 mmol/L (ref 98–109)
CO2: 23 mmol/L (ref 22–29)
Calcium: 9.1 mg/dL (ref 8.4–10.4)
Creatinine: 0.7 mg/dL (ref 0.60–1.10)
GFR, Est AFR Am: >60 mL/min (ref >60)
Glucose, Bld: 120 mg/dL (ref 70–140)
POTASSIUM: 3.7 mmol/L (ref 3.5–5.1)
Sodium: 131 mmol/L — ABNORMAL LOW (ref 136–145)
TOTAL PROTEIN: 6 g/dL — AB (ref 6.4–8.3)

## 2018-04-13 MED ORDER — HEPARIN SOD (PORK) LOCK FLUSH 100 UNIT/ML IV SOLN
500.0000 [IU] | Freq: Once | INTRAVENOUS | Status: AC | PRN
  Administered 2018-04-13: 500 [IU]
  Filled 2018-04-13: qty 5

## 2018-04-13 MED ORDER — SODIUM CHLORIDE 0.9 % IV SOLN
Freq: Once | INTRAVENOUS | Status: AC
  Administered 2018-04-13: 09:00:00 via INTRAVENOUS

## 2018-04-13 MED ORDER — DIPHENHYDRAMINE HCL 50 MG/ML IJ SOLN
INTRAMUSCULAR | Status: AC
  Filled 2018-04-13: qty 1

## 2018-04-13 MED ORDER — DIPHENHYDRAMINE HCL 50 MG/ML IJ SOLN
25.0000 mg | Freq: Once | INTRAMUSCULAR | Status: AC
  Administered 2018-04-13: 25 mg via INTRAVENOUS

## 2018-04-13 MED ORDER — SODIUM CHLORIDE 0.9% FLUSH
10.0000 mL | INTRAVENOUS | Status: DC | PRN
  Administered 2018-04-13: 10 mL
  Filled 2018-04-13: qty 10

## 2018-04-13 MED ORDER — FAMOTIDINE IN NACL 20-0.9 MG/50ML-% IV SOLN
INTRAVENOUS | Status: AC
  Filled 2018-04-13: qty 50

## 2018-04-13 MED ORDER — SODIUM CHLORIDE 0.9 % IV SOLN
20.0000 mg | Freq: Once | INTRAVENOUS | Status: AC
  Administered 2018-04-13: 20 mg via INTRAVENOUS
  Filled 2018-04-13: qty 2

## 2018-04-13 MED ORDER — CARBOPLATIN CHEMO INJECTION 450 MG/45ML
192.2000 mg | Freq: Once | INTRAVENOUS | Status: AC
  Administered 2018-04-13: 190 mg via INTRAVENOUS
  Filled 2018-04-13: qty 19

## 2018-04-13 MED ORDER — FAMOTIDINE IN NACL 20-0.9 MG/50ML-% IV SOLN
20.0000 mg | Freq: Once | INTRAVENOUS | Status: AC
  Administered 2018-04-13: 20 mg via INTRAVENOUS

## 2018-04-13 MED ORDER — SODIUM CHLORIDE 0.9 % IV SOLN
80.0000 mg/m2 | Freq: Once | INTRAVENOUS | Status: AC
  Administered 2018-04-13: 156 mg via INTRAVENOUS
  Filled 2018-04-13: qty 26

## 2018-04-13 NOTE — Patient Instructions (Signed)
[facility] Discharge Instructions for Patients Receiving Chemotherapy  Today you received the following chemotherapy agents taxol/carboplatin   To help prevent nausea and vomiting after your treatment, we encourage you to take your nausea medication as direcred. If you develop nausea and vomiting that is not controlled by your nausea medication, call the clinic.   BELOW ARE SYMPTOMS THAT SHOULD BE REPORTED IMMEDIATELY:  *FEVER GREATER THAN 100.5 F  *CHILLS WITH OR WITHOUT FEVER  NAUSEA AND VOMITING THAT IS NOT CONTROLLED WITH YOUR NAUSEA MEDICATION  *UNUSUAL SHORTNESS OF BREATH  *UNUSUAL BRUISING OR BLEEDING  TENDERNESS IN MOUTH AND THROAT WITH OR WITHOUT PRESENCE OF ULCERS  *URINARY PROBLEMS  *BOWEL PROBLEMS  UNUSUAL RASH Items with * indicate a potential emergency and should be followed up as soon as possible.  Feel free to call the clinic you have any questions or concerns. The clinic phone number is [phone].  

## 2018-04-14 ENCOUNTER — Telehealth: Payer: Self-pay | Admitting: *Deleted

## 2018-04-14 ENCOUNTER — Other Ambulatory Visit: Payer: Self-pay | Admitting: Oncology

## 2018-04-14 DIAGNOSIS — C50919 Malignant neoplasm of unspecified site of unspecified female breast: Secondary | ICD-10-CM

## 2018-04-14 NOTE — Telephone Encounter (Signed)
[id number] UPBEAT:  Scheduled Month 3 visit for UPBEAT study.  Patient requests Thursday, May 16 for questionnaires, neurocognitive testing, physical functions testing and cMRI.  Appointment scheduled for 12:30pm, with cMRI scheduled for 3:00pm on that day.   Labs to be drawn on her treatment day that week at the same time as routine labs; Tuesday, 05/04/18.  Patient acknowledged and verbalized an understanding of need to fast 3 hours for Month 3 lab work on that day (5/14). Doreatha Martin, RN, BSN, [facility] 04/14/2018 9:56 AM

## 2018-04-15 ENCOUNTER — Other Ambulatory Visit: Payer: Self-pay | Admitting: Oncology

## 2018-04-15 DIAGNOSIS — C50912 Malignant neoplasm of unspecified site of left female breast: Secondary | ICD-10-CM | POA: Diagnosis not present

## 2018-04-15 DIAGNOSIS — C50911 Malignant neoplasm of unspecified site of right female breast: Secondary | ICD-10-CM | POA: Diagnosis not present

## 2018-04-16 ENCOUNTER — Encounter: Payer: Self-pay | Admitting: Pharmacist

## 2018-04-20 ENCOUNTER — Inpatient Hospital Stay: Payer: PPO

## 2018-04-20 ENCOUNTER — Telehealth: Payer: Self-pay | Admitting: Oncology

## 2018-04-20 ENCOUNTER — Inpatient Hospital Stay (HOSPITAL_BASED_OUTPATIENT_CLINIC_OR_DEPARTMENT_OTHER): Payer: PPO | Admitting: Oncology

## 2018-04-20 VITALS — BP 125/90 | HR 100 | Temp 99.8°F | Resp 18 | Ht 65.0 in | Wt 168.6 lb

## 2018-04-20 DIAGNOSIS — R5383 Other fatigue: Secondary | ICD-10-CM | POA: Diagnosis not present

## 2018-04-20 DIAGNOSIS — Z170 Estrogen receptor positive status [ER+]: Secondary | ICD-10-CM | POA: Diagnosis not present

## 2018-04-20 DIAGNOSIS — Z9013 Acquired absence of bilateral breasts and nipples: Secondary | ICD-10-CM | POA: Diagnosis not present

## 2018-04-20 DIAGNOSIS — C50912 Malignant neoplasm of unspecified site of left female breast: Secondary | ICD-10-CM | POA: Diagnosis not present

## 2018-04-20 DIAGNOSIS — Z5111 Encounter for antineoplastic chemotherapy: Secondary | ICD-10-CM | POA: Diagnosis not present

## 2018-04-20 DIAGNOSIS — Z95828 Presence of other vascular implants and grafts: Secondary | ICD-10-CM

## 2018-04-20 DIAGNOSIS — C50411 Malignant neoplasm of upper-outer quadrant of right female breast: Secondary | ICD-10-CM

## 2018-04-20 DIAGNOSIS — C50812 Malignant neoplasm of overlapping sites of left female breast: Secondary | ICD-10-CM

## 2018-04-20 LAB — CMP (CANCER CENTER ONLY)
ALT: 19 U/L (ref 0–55)
AST: 21 U/L (ref 5–34)
Albumin: 3.1 g/dL — ABNORMAL LOW (ref 3.5–5.0)
Alkaline Phosphatase: 89 U/L (ref 40–150)
Anion gap: 8 (ref 3–11)
BUN: 7 mg/dL (ref 7–26)
CHLORIDE: 100 mmol/L (ref 98–109)
CO2: 25 mmol/L (ref 22–29)
CREATININE: 0.66 mg/dL (ref 0.60–1.10)
Calcium: 9.2 mg/dL (ref 8.4–10.4)
GFR, Est AFR Am: >60 mL/min (ref >60)
GFR, Estimated: >60 mL/min (ref >60)
GLUCOSE: 107 mg/dL (ref 70–140)
Potassium: 3.6 mmol/L (ref 3.5–5.1)
SODIUM: 133 mmol/L — AB (ref 136–145)
Total Bilirubin: 0.4 mg/dL (ref 0.2–1.2)
Total Protein: 5.9 g/dL — ABNORMAL LOW (ref 6.4–8.3)

## 2018-04-20 LAB — CBC WITH DIFFERENTIAL (CANCER CENTER ONLY)
BASOS ABS: 0 10*3/uL (ref 0.0–0.1)
Basophils Relative: 1 %
Eosinophils Absolute: 0 10*3/uL (ref 0.0–0.5)
Eosinophils Relative: 0 %
HEMATOCRIT: 25 % — AB (ref 34.8–46.6)
HEMOGLOBIN: 8.7 g/dL — AB (ref 11.6–15.9)
LYMPHS PCT: 5 %
Lymphs Abs: 0.2 10*3/uL — ABNORMAL LOW (ref 0.9–3.3)
MCH: 31.1 pg (ref 25.1–34.0)
MCHC: 34.7 g/dL (ref 31.5–36.0)
MCV: 89.6 fL (ref 79.5–101.0)
MONO ABS: 0.8 10*3/uL (ref 0.1–0.9)
Monocytes Relative: 18 %
NEUTROS ABS: 3.5 10*3/uL (ref 1.5–6.5)
NEUTROS PCT: 76 %
Platelet Count: 429 10*3/uL — ABNORMAL HIGH (ref 145–400)
RBC: 2.79 MIL/uL — AB (ref 3.70–5.45)
RDW: 17.6 % — ABNORMAL HIGH (ref 11.2–14.5)
WBC: 4.6 10*3/uL (ref 3.9–10.3)

## 2018-04-20 MED ORDER — DIPHENHYDRAMINE HCL 50 MG/ML IJ SOLN
INTRAMUSCULAR | Status: AC
  Filled 2018-04-20: qty 1

## 2018-04-20 MED ORDER — FAMOTIDINE IN NACL 20-0.9 MG/50ML-% IV SOLN
20.0000 mg | Freq: Once | INTRAVENOUS | Status: AC
  Administered 2018-04-20: 20 mg via INTRAVENOUS

## 2018-04-20 MED ORDER — SODIUM CHLORIDE 0.9% FLUSH
10.0000 mL | INTRAVENOUS | Status: DC | PRN
  Administered 2018-04-20: 10 mL
  Filled 2018-04-20: qty 10

## 2018-04-20 MED ORDER — HEPARIN SOD (PORK) LOCK FLUSH 100 UNIT/ML IV SOLN
500.0000 [IU] | Freq: Once | INTRAVENOUS | Status: AC | PRN
  Administered 2018-04-20: 500 [IU]
  Filled 2018-04-20: qty 5

## 2018-04-20 MED ORDER — DEXAMETHASONE SODIUM PHOSPHATE 10 MG/ML IJ SOLN
10.0000 mg | Freq: Once | INTRAMUSCULAR | Status: AC
  Administered 2018-04-20: 10 mg via INTRAVENOUS

## 2018-04-20 MED ORDER — FAMOTIDINE IN NACL 20-0.9 MG/50ML-% IV SOLN
INTRAVENOUS | Status: AC
  Filled 2018-04-20: qty 50

## 2018-04-20 MED ORDER — SODIUM CHLORIDE 0.9 % IV SOLN
Freq: Once | INTRAVENOUS | Status: AC
  Administered 2018-04-20: 09:00:00 via INTRAVENOUS

## 2018-04-20 MED ORDER — DIPHENHYDRAMINE HCL 50 MG/ML IJ SOLN
25.0000 mg | Freq: Once | INTRAMUSCULAR | Status: AC
  Administered 2018-04-20: 25 mg via INTRAVENOUS

## 2018-04-20 MED ORDER — DEXAMETHASONE SODIUM PHOSPHATE 10 MG/ML IJ SOLN
INTRAMUSCULAR | Status: AC
  Filled 2018-04-20: qty 1

## 2018-04-20 MED ORDER — SODIUM CHLORIDE 0.9 % IV SOLN
80.0000 mg/m2 | Freq: Once | INTRAVENOUS | Status: AC
  Administered 2018-04-20: 156 mg via INTRAVENOUS
  Filled 2018-04-20: qty 26

## 2018-04-20 MED ORDER — CARBOPLATIN CHEMO INJECTION 450 MG/45ML
192.2000 mg | Freq: Once | INTRAVENOUS | Status: AC
  Administered 2018-04-20: 190 mg via INTRAVENOUS
  Filled 2018-04-20: qty 19

## 2018-04-20 NOTE — Telephone Encounter (Signed)
Gave patient AVs and calendar of upcoming May appointments.  °

## 2018-04-20 NOTE — Patient Instructions (Signed)
[facility] Discharge Instructions for Patients Receiving Chemotherapy  Today you received the following chemotherapy agents Taxol and Carcoplatin  To help prevent nausea and vomiting after your treatment, we encourage you to take your nausea medication as prescribed. If you develop nausea and vomiting that is not controlled by your nausea medication, call the clinic.   BELOW ARE SYMPTOMS THAT SHOULD BE REPORTED IMMEDIATELY:  *FEVER GREATER THAN 100.5 F  *CHILLS WITH OR WITHOUT FEVER  NAUSEA AND VOMITING THAT IS NOT CONTROLLED WITH YOUR NAUSEA MEDICATION  *UNUSUAL SHORTNESS OF BREATH  *UNUSUAL BRUISING OR BLEEDING  TENDERNESS IN MOUTH AND THROAT WITH OR WITHOUT PRESENCE OF ULCERS  *URINARY PROBLEMS  *BOWEL PROBLEMS  UNUSUAL RASH Items with * indicate a potential emergency and should be followed up as soon as possible.  Feel free to call the clinic should you have any questions or concerns. The clinic phone number is [phone].  Please show the [facility] at check-in to the Emergency Department and triage nurse.  Paclitaxel injection (Taxol) What is this medicine? PACLITAXEL (PAK li TAX el) is a chemotherapy drug. It targets fast dividing cells, like cancer cells, and causes these cells to die. This medicine is used to treat ovarian cancer, breast cancer, and other cancers. This medicine may be used for other purposes; ask your health care provider or pharmacist if you have questions. COMMON BRAND NAME(S): Onxol, Taxol What should I tell my health care provider before I take this medicine? They need to know if you have any of these conditions: -blood disorders -irregular heartbeat -infection (especially a virus infection such as chickenpox, cold sores, or herpes) -liver disease -previous or ongoing radiation therapy -an unusual or allergic reaction to paclitaxel, alcohol, polyoxyethylated castor oil, other chemotherapy agents, other medicines,  foods, dyes, or preservatives -pregnant or trying to get pregnant -breast-feeding How should I use this medicine? This drug is given as an infusion into a vein. It is administered in a hospital or clinic by a specially trained health care professional. Talk to your pediatrician regarding the use of this medicine in children. Special care may be needed. Overdosage: If you think you have taken too much of this medicine contact a poison control center or emergency room at once. NOTE: This medicine is only for you. Do not share this medicine with others. What if I miss a dose? It is important not to miss your dose. Call your doctor or health care professional if you are unable to keep an appointment. What may interact with this medicine? Do not take this medicine with any of the following medications: -disulfiram -metronidazole This medicine may also interact with the following medications: -cyclosporine -diazepam -ketoconazole -medicines to increase blood counts like filgrastim, pegfilgrastim, sargramostim -other chemotherapy drugs like cisplatin, doxorubicin, epirubicin, etoposide, teniposide, vincristine -quinidine -testosterone -vaccines -verapamil Talk to your doctor or health care professional before taking any of these medicines: -acetaminophen -aspirin -ibuprofen -ketoprofen -naproxen This list may not describe all possible interactions. Give your health care provider a list of all the medicines, herbs, non-prescription drugs, or dietary supplements you use. Also tell them if you smoke, drink alcohol, or use illegal drugs. Some items may interact with your medicine. What should I watch for while using this medicine? Your condition will be monitored carefully while you are receiving this medicine. You will need important blood work done while you are taking this medicine. This medicine can cause serious allergic reactions. To reduce your risk you will need to  take other  medicine(s) before treatment with this medicine. If you experience allergic reactions like skin rash, itching or hives, swelling of the face, lips, or tongue, tell your doctor or health care professional right away. In some cases, you may be given additional medicines to help with side effects. Follow all directions for their use. This drug may make you feel generally unwell. This is not uncommon, as chemotherapy can affect healthy cells as well as cancer cells. Report any side effects. Continue your course of treatment even though you feel ill unless your doctor tells you to stop. Call your doctor or health care professional for advice if you get a fever, chills or sore throat, or other symptoms of a cold or flu. Do not treat yourself. This drug decreases your body's ability to fight infections. Try to avoid being around people who are sick. This medicine may increase your risk to bruise or bleed. Call your doctor or health care professional if you notice any unusual bleeding. Be careful brushing and flossing your teeth or using a toothpick because you may get an infection or bleed more easily. If you have any dental work done, tell your dentist you are receiving this medicine. Avoid taking products that contain aspirin, acetaminophen, ibuprofen, naproxen, or ketoprofen unless instructed by your doctor. These medicines may hide a fever. Do not become pregnant while taking this medicine. Women should inform their doctor if they wish to become pregnant or think they might be pregnant. There is a potential for serious side effects to an unborn child. Talk to your health care professional or pharmacist for more information. Do not breast-feed an infant while taking this medicine. Men are advised not to father a child while receiving this medicine. This product may contain alcohol. Ask your pharmacist or healthcare provider if this medicine contains alcohol. Be sure to tell all healthcare providers you are  taking this medicine. Certain medicines, like metronidazole and disulfiram, can cause an unpleasant reaction when taken with alcohol. The reaction includes flushing, headache, nausea, vomiting, sweating, and increased thirst. The reaction can last from 30 minutes to several hours. What side effects may I notice from receiving this medicine? Side effects that you should report to your doctor or health care professional as soon as possible: -allergic reactions like skin rash, itching or hives, swelling of the face, lips, or tongue -low blood counts - This drug may decrease the number of white blood cells, red blood cells and platelets. You may be at increased risk for infections and bleeding. -signs of infection - fever or chills, cough, sore throat, pain or difficulty passing urine -signs of decreased platelets or bleeding - bruising, pinpoint red spots on the skin, black, tarry stools, nosebleeds -signs of decreased red blood cells - unusually weak or tired, fainting spells, lightheadedness -breathing problems -chest pain -high or low blood pressure -mouth sores -nausea and vomiting -pain, swelling, redness or irritation at the injection site -pain, tingling, numbness in the hands or feet -slow or irregular heartbeat -swelling of the ankle, feet, hands Side effects that usually do not require medical attention (report to your doctor or health care professional if they continue or are bothersome): -bone pain -complete hair loss including hair on your head, underarms, pubic hair, eyebrows, and eyelashes -changes in the color of fingernails -diarrhea -loosening of the fingernails -loss of appetite -muscle or joint pain -red flush to skin -sweating This list may not describe all possible side effects. Call your doctor for medical advice about  side effects. You may report side effects to FDA at 1-800-FDA-1088. Where should I keep my medicine? This drug is given in a hospital or clinic and  will not be stored at home. NOTE: This sheet is a summary. It may not cover all possible information. If you have questions about this medicine, talk to your doctor, pharmacist, or health care provider.  2018 [employer]/Gold Standard (2015-10-09 19:58:00)   Carboplatin injection What is this medicine? CARBOPLATIN (KAR boe pla tin) is a chemotherapy drug. It targets fast dividing cells, like cancer cells, and causes these cells to die. This medicine is used to treat ovarian cancer and many other cancers. This medicine may be used for other purposes; ask your health care provider or pharmacist if you have questions. COMMON BRAND NAME(S): Paraplatin What should I tell my health care provider before I take this medicine? They need to know if you have any of these conditions: -blood disorders -hearing problems -kidney disease -recent or ongoing radiation therapy -an unusual or allergic reaction to carboplatin, cisplatin, other chemotherapy, other medicines, foods, dyes, or preservatives -pregnant or trying to get pregnant -breast-feeding How should I use this medicine? This drug is usually given as an infusion into a vein. It is administered in a hospital or clinic by a specially trained health care professional. Talk to your pediatrician regarding the use of this medicine in children. Special care may be needed. Overdosage: If you think you have taken too much of this medicine contact a poison control center or emergency room at once. NOTE: This medicine is only for you. Do not share this medicine with others. What if I miss a dose? It is important not to miss a dose. Call your doctor or health care professional if you are unable to keep an appointment. What may interact with this medicine? -medicines for seizures -medicines to increase blood counts like filgrastim, pegfilgrastim, sargramostim -some antibiotics like amikacin, gentamicin, neomycin, streptomycin, tobramycin -vaccines Talk to  your doctor or health care professional before taking any of these medicines: -acetaminophen -aspirin -ibuprofen -ketoprofen -naproxen This list may not describe all possible interactions. Give your health care provider a list of all the medicines, herbs, non-prescription drugs, or dietary supplements you use. Also tell them if you smoke, drink alcohol, or use illegal drugs. Some items may interact with your medicine. What should I watch for while using this medicine? Your condition will be monitored carefully while you are receiving this medicine. You will need important blood work done while you are taking this medicine. This drug may make you feel generally unwell. This is not uncommon, as chemotherapy can affect healthy cells as well as cancer cells. Report any side effects. Continue your course of treatment even though you feel ill unless your doctor tells you to stop. In some cases, you may be given additional medicines to help with side effects. Follow all directions for their use. Call your doctor or health care professional for advice if you get a fever, chills or sore throat, or other symptoms of a cold or flu. Do not treat yourself. This drug decreases your body's ability to fight infections. Try to avoid being around people who are sick. This medicine may increase your risk to bruise or bleed. Call your doctor or health care professional if you notice any unusual bleeding. Be careful brushing and flossing your teeth or using a toothpick because you may get an infection or bleed more easily. If you have any dental work done, tell your dentist  you are receiving this medicine. Avoid taking products that contain aspirin, acetaminophen, ibuprofen, naproxen, or ketoprofen unless instructed by your doctor. These medicines may hide a fever. Do not become pregnant while taking this medicine. Women should inform their doctor if they wish to become pregnant or think they might be pregnant. There is a  potential for serious side effects to an unborn child. Talk to your health care professional or pharmacist for more information. Do not breast-feed an infant while taking this medicine. What side effects may I notice from receiving this medicine? Side effects that you should report to your doctor or health care professional as soon as possible: -allergic reactions like skin rash, itching or hives, swelling of the face, lips, or tongue -signs of infection - fever or chills, cough, sore throat, pain or difficulty passing urine -signs of decreased platelets or bleeding - bruising, pinpoint red spots on the skin, black, tarry stools, nosebleeds -signs of decreased red blood cells - unusually weak or tired, fainting spells, lightheadedness -breathing problems -changes in hearing -changes in vision -chest pain -high blood pressure -low blood counts - This drug may decrease the number of white blood cells, red blood cells and platelets. You may be at increased risk for infections and bleeding. -nausea and vomiting -pain, swelling, redness or irritation at the injection site -pain, tingling, numbness in the hands or feet -problems with balance, talking, walking -trouble passing urine or change in the amount of urine Side effects that usually do not require medical attention (report to your doctor or health care professional if they continue or are bothersome): -hair loss -loss of appetite -metallic taste in the mouth or changes in taste This list may not describe all possible side effects. Call your doctor for medical advice about side effects. You may report side effects to FDA at 1-800-FDA-1088. Where should I keep my medicine? This drug is given in a hospital or clinic and will not be stored at home. NOTE: This sheet is a summary. It may not cover all possible information. If you have questions about this medicine, talk to your doctor, pharmacist, or health care provider.  2018 [employer]/Gold  Standard (2008-03-14 14:38:05)  

## 2018-04-20 NOTE — Progress Notes (Signed)
[place]  Telephone:(336) [phone] Fax:(336) [phone]     ID: Samantha Sullivan DOB: 1951-06-23  MR#: [id number]  GNF#:[id number]  Patient Care Team: Shon Baton, MD as PCP - General (Internal Medicine) Rajinder Mesick, Virgie Dad, MD as Consulting Physician (Oncology) Kem Boroughs, [place] as Nurse Practitioner (Family Medicine) Regal, Tamala Fothergill, DPM as Consulting Physician (Podiatry) Excell Seltzer, MD as Consulting Physician (General Surgery) Force, Shari Prows, DO as Referring Physician (Student) PCP: Shon Baton, MD OTHER MD:  CHIEF COMPLAINT: Contralateral breast cancer, triple negative  CURRENT TREATMENT: Adjuvant chemotherapy  INTERVAL HISTORY: Samantha Sullivan returns today for follow-up of her triple negative breast cancer. She completed treatment with cycle 4 of 4 planned cycles of cyclophosphamide and doxorubicin on 03/30/2018 and last week received the first of 12 planned doses of carboplatin and paclitaxel, with dose #2 due today.  She did generally well with the first dose.  She did feel more tired but then she drove all the way to [place] in her [place]i and then had a big birthday party for her 65-year-old grandson there.  She then had to drive back.  She did not have any reaction to the [place] or [place].  Her port is working well.    REVIEW OF SYSTEMS: Samantha Sullivan says that she has had some difficulty eating.  She just does not want to put anything in her mouth and swallow it.  She has no nausea and there has been no vomiting.  There have been no mouth sores.  There is been some mild taste change.  There is been no change in bowel or bladder habits.  She did not actually have to take any nausea medicine after the first cycle of chemo.  She has had a little bit of discomfort at the nailbeds but not at the finger pads or toe pads.  Aside from these issues a detailed review of systems today was noncontributory   RIGHT BREAST CANCER HISTORY:  From the original intake  note:  "Samantha Sullivan"noted a lump in her right breast sometime in late October or early November, but there were "so many things going on in her life" she did not bring it to medical attention until she saw [name] 12/11/2015. Ms Samantha Sullivan was able to palpate a mass at the 9:30 o'clock position in the right breast, which was mobile and nontender. The left breast is status post prior remote biopsy and there was some scar tissue at the 7:00 position. The patient was set up for bilateral diagnostic Mammography with tomos[facility] at [facility] 12/27/2015. This found the breast density to be category C. There was a new oval mass in the right breast upper outer quadrant. Ultrasound the same day confirmed a 2.2 cm mass with irregular margins in the right breast at the 9:00 position read as highly suggestive of malignancy.  Biopsy of the right breast mass in question 01/02/2016 found ([id number]) an invasive ductal carcinoma, grade 1, estrogen receptor 100% positive, progesterone receptor 90% positive, both with strong staining intensity, with an MIB-1 of 10%, and no HER-2 amplification, the signals ratio being 1.32 and the number per cell 2.05. The patient was informed and instructed to stop hormone replacement therapy (last dose 01/04/2016).  Her subsequent history is as detailed below.    PAST MEDICAL HISTORY: Past Medical History:  Diagnosis Date  . Abnormal CT of the chest 12/06   numeroous lung nodules - most have resolved.  . Arthritis    psoriatic arthritis- toes & feet, but resolved at this  point- no need meds at this point    . Cancer [facility])    right breast cancer  . Complication of anesthesia    /w colonoscopy- woke up before procedure was complete  . Family history of adverse reaction to anesthesia    pt's mother reported difficulty being given " enough " medicine to outcome a successful anesth. experience  . Family history of leukemia   . Family history of ovarian cancer   . History of meningioma  2007   in 2013 stable on MRI and recheck in 5 years, no surgery necessry   . HSV infection 1970's- 1980's  . Psoriatic arthritis ([place])     PAST SURGICAL HISTORY: Past Surgical History:  Procedure Laterality Date  . BILATERAL TOTAL MASTECTOMY WITH AXILLARY LYMPH NODE DISSECTION Bilateral 01/04/2018   Procedure: BILATERAL TOTAL MASTECTOMY PROPHYLATIC RIGHT  WITH LEFT SENTINEAL  LYMPH NODE;  Surgeon: Excell Seltzer, MD;  Location: [place];  Service: General;  Laterality: Bilateral;  . BREAST LUMPECTOMY WITH RADIOACTIVE SEED AND SENTINEL LYMPH NODE BIOPSY Right 01/22/2016   Procedure: RIGHT BREAST LUMPECTOMY WITH RADIOACTIVE SEED AND SENTINEL LYMPH NODE BIOPSY;  Surgeon: Excell Seltzer, MD;  Location: [place];  Service: General;  Laterality: Right;  . BREAST SURGERY Left 1971, 2016   lumpectomy benign, 2016- Br. Ca- R lumpectomy & radiation   . COLONOSCOPY  8/06   normal and recheck in 10 years  . HAMMER TOE SURGERY Left 2016  . MASTECTOMY Right 01/04/2018   PROPHYLATIC   . MASTECTOMY COMPLETE / SIMPLE W/ SENTINEL NODE BIOPSY Left 01/04/2018  . PORTACATH PLACEMENT Right 01/04/2018   Procedure: INSERTION PORT-A-CATH;  Surgeon: Excell Seltzer, MD;  Location: [place];  Service: General;  Laterality: Right;  . THYROIDECTOMY, PARTIAL Left 1983    FAMILY HISTORY Family History  Problem Relation Age of Onset  . Osteoporosis Mother   . Osteoporosis Sister   . Lung cancer Maternal Aunt        smoker  . Heart Problems Maternal Grandmother   . COPD Maternal Grandfather   . Neurodegenerative disease Maternal Aunt        corical-ganglion degeneration  . Ovarian cancer Maternal Aunt        dx in her 39s  . Leukemia Cousin        106s-40s; maternal cousin  Samantha Sullivan has essentially no information on her father's side of the family aside from the fact that he died in his 71s from heart disease. He had psoriasis. The patient's mother passed away Mar 06, 2017 age 54, due to breast  cancer. The patient had 2 brothers who died from congenital heart disease shortly after birth. The patient has one sister. The patient's mother had one sister, the patient's aunt, diagnosed with ovarian cancer in her 30s.  GYNECOLOGIC HISTORY:  Patient's last menstrual period was 03/22/2000 (approximate). Menarche age 75, first live birth age 62. The patient is GX P2. She went through menopause approximately 1990 but was taking hormone replacement until 01/04/2016. Since coming off replacement she has had insomnia but no hot flashes or vaginal dryness pleural bones so 4. 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 She is divorced and lives alone, with no pets. Her daughter Samantha Sullivan lives in [place]. She is disabled secondary to schizoaffective disorder. Daughter Samantha Sullivan lives in [place] and she is a Freight forwarder  and dancer but mostly a homemaker. The patient has 3 grandchildren, the older 67, the other 2 being under 35 years old. The patient attends [employer]. Her mother passed away in 03-12-2017 due to breast cancer.    ADVANCED DIRECTIVES: her daughter Samantha Sullivan is her healthcare power of attorney. She can be reached at [place]: Social History   Tobacco Use  . Smoking status: Former Smoker    Packs/day: 1.00    Years: 12.00    Pack years: 12.00    Types: Cigarettes    Last attempt to quit: 07/23/1981    Years since quitting: 36.7  . Smokeless tobacco: Never Used  Substance Use Topics  . Alcohol use: Yes    Alcohol/week: 0.0 oz    Comment: social occasion only- special   . Drug use: No     Colonoscopy: ; [facility]  [id number] 2016/[facility]  Bone density:rJanuary 2018/normal   Lipid panel:  Allergies  Allergen Reactions  . Bee Venom   . Ciprofloxacin   . Levaquin [Levofloxacin  In D5w]   . Norco [Hydrocodone-Acetaminophen] Other (See Comments)    Pt states Norco made her extremely hyper after her surgery and she was not able to sleep.    Current Outpatient Medications  Medication Sig Dispense Refill  . FLURANDRENOLIDE EX Apply 1 application topically 2 (two) times daily as needed (for psoriasis). Cordran Tape    . folic acid (FOLVITE) 1 MG tablet Take 2 mg by mouth every evening.    . lidocaine-prilocaine (EMLA) cream Apply to affected area once 30 g 3  . LORazepam (ATIVAN) 0.5 MG tablet Take 1 tablet (0.5 mg total) by mouth at bedtime as needed and may repeat dose one time if needed (Nausea or vomiting). 10 tablet 0  . omeprazole (PRILOSEC) 40 MG capsule TAKE 1 CAPSULE BY MOUTH EVERY DAY 30 capsule 0  . Polyethyl Glycol-Propyl Glycol (LUBRICANT EYE DROPS) 0.4-0.3 % SOLN Place 1-2 drops into both eyes 3 (three) times daily as needed (dry eyes/irritated eyes.).    Marland Kitchen prochlorperazine (COMPAZINE) 10 MG tablet Take 1 tablet (10 mg total) by mouth every 6 (six) hours as needed (Nausea or vomiting). 30 tablet 1  . traMADol (ULTRAM) 50 MG tablet Take 1 tablet (50 mg total) by mouth every 6 (six) hours as needed (mild pain). 20 tablet 0   Current Facility-Administered Medications  Medication Dose Route Frequency Provider Last Rate Last Dose  . triamcinolone acetonide (KENALOG) 10 MG/ML injection 10 mg  10 mg Other Once Wallene Huh, DPM        OBJECTIVE: middle-aged white woman in no acute distress Vitals:   04/20/18 0808  BP: 125/90  Sullivan: 100  Resp: 18  Temp: 99.8 F (37.7 C)  SpO2: 99%     Body mass index is 28.06 kg/m.      ECOG FS:1 - Symptomatic but completely ambulatory  Sclerae unicteric, EOMs intact Oropharynx clear and moist No cervical or supraclavicular adenopathy Lungs no rales or rhonchi Heart regular rate and rhythm Abd soft, nontender, positive bowel sounds MSK no focal spinal tenderness, no upper extremity lymphedema Neuro: nonfocal,  well oriented, appropriate affect Breasts: Deferred   LAB RESULTS:  CMP     Component Value Date/Time   NA 131 (L) 04/13/2018 0759   NA 141 08/05/2017 1335   K 3.7 04/13/2018 0759   K 4.5 08/05/2017 1335   CL 99 04/13/2018 0759   CO2 23 04/13/2018 0759   CO2 29  08/05/2017 1335   GLUCOSE 120 04/13/2018 0759   GLUCOSE 77 08/05/2017 1335   BUN 8 04/13/2018 0759   BUN 13.4 08/05/2017 1335   CREATININE 0.70 04/13/2018 0759   CREATININE 0.8 08/05/2017 1335   CALCIUM 9.1 04/13/2018 0759   CALCIUM 10.0 08/05/2017 1335   PROT 6.0 (L) 04/13/2018 0759   PROT 6.8 08/05/2017 1335   ALBUMIN 3.1 (L) 04/13/2018 0759   ALBUMIN 3.6 08/05/2017 1335   AST 15 04/13/2018 0759   AST 19 08/05/2017 1335   ALT 19 04/13/2018 0759   ALT 12 08/05/2017 1335   ALKPHOS 109 04/13/2018 0759   ALKPHOS 169 (H) 08/05/2017 1335   BILITOT 0.5 04/13/2018 0759   BILITOT 0.63 08/05/2017 1335   GFRNONAA >60 04/13/2018 0759   GFRAA >60 04/13/2018 0759    INo results found for: SPEP, UPEP  Lab Results  Component Value Date   WBC 4.8 04/13/2018   NEUTROABS 3.9 04/13/2018   HGB 9.1 (L) 04/13/2018   HCT 26.7 (L) 04/13/2018   MCV 88.4 04/13/2018   PLT 266 04/13/2018      Chemistry      Component Value Date/Time   NA 131 (L) 04/13/2018 0759   NA 141 08/05/2017 1335   K 3.7 04/13/2018 0759   K 4.5 08/05/2017 1335   CL 99 04/13/2018 0759   CO2 23 04/13/2018 0759   CO2 29 08/05/2017 1335   BUN 8 04/13/2018 0759   BUN 13.4 08/05/2017 1335   CREATININE 0.70 04/13/2018 0759   CREATININE 0.8 08/05/2017 1335      Component Value Date/Time   CALCIUM 9.1 04/13/2018 0759   CALCIUM 10.0 08/05/2017 1335   ALKPHOS 109 04/13/2018 0759   ALKPHOS 169 (H) 08/05/2017 1335   AST 15 04/13/2018 0759   AST 19 08/05/2017 1335   ALT 19 04/13/2018 0759   ALT 12 08/05/2017 1335   BILITOT 0.5 04/13/2018 0759   BILITOT 0.63 08/05/2017 1335       No results found for: LABCA2  No components found for:  LABCA125  No results for input(s): INR in the last 168 hours.  Urinalysis    Component Value Date/Time   BILIRUBINUR neg 12/11/2015 0925   PROTEINUR neg 12/11/2015 0925   UROBILINOGEN negative 12/11/2015 0925   NITRITE neg 12/11/2015 0925   LEUKOCYTESUR Negative 12/11/2015 0925    STUDIES: US Breast Ltd Uni Right Inc Axilla  Result Date: 04/08/2018 CLINICAL DATA:  67 year old patient has a history of bilateral breast cancer, most recently diagnosed on the left in December of 2018. In January 2019, she underwent bilateral mastectomies, the right mastectomy was prophylactic. She states that a postoperative seroma was drained on the right side after surgery. Currently, she presents for evaluation of a possible seroma on the right. Her primary area of concern is the anterior right chest wall inferior to her right-sided Port-A-Cath and medial to her right axilla. She feels some fullness and sensitivity in this region and wonders if something is pushing up on her port. EXAM: ULTRASOUND OF THE RIGHT BREAST (MASTECTOMY) COMPARISON:  Outside imaging of the left breast from [facility] December 2018. FINDINGS: On physical exam, there are postoperative changes of a healed right mastectomy and healed right axillary scar. Right-sided Port-A-Cath is visible. On the anterior right chest wall, inferior to the Port-A-Cath, I palpate soft subcutaneous tissues. No dominant or definite mass is palpated in the right axilla. Targeted ultrasound is performed with the patient lying supine and sitting upright. Normal  subcutaneous fat and muscle is seen on evaluation of the anterior right chest wall in the region of patient concern. There is no postoperative seroma or hematoma of the anterior chest wall or in the tissue surrounding the Port-A-Cath. Best imaged when the patient is supine, with the right arm raised, is a simple crescentic and nondistended appearing fluid collection in the inferior and medial right  axilla, consistent with a postoperative seroma. This measures approximately 3.3 x 3.4 x 0.8 cm. No lymphadenopathy or suspicious mass. IMPRESSION: 1. The area of predominant concern to the patient in the anterior chest wall inferior to the Port-A-Cath and medial to the right axilla corresponds to normal subcutaneous tissue on ultrasound. There is no mass or postoperative fluid collection in this region. 2. Crescentic simple postoperative seroma in the inferior medial right axilla measuring 3.4 x 3.3 x 0.8 cm. RECOMMENDATION: The patient and I discussed the options of ultrasound-guided aspiration of the small seroma versus no intervention. Given that the seroma in the axilla is small, and does not correspond to her primary area of concern, she and I do not feel that aspiration is warranted at this time. She is aware that this can be performed in the future if she feels the seroma enlarges or becomes more bothersome to her. I have discussed the findings and recommendations with the patient. Results were also provided in writing at the conclusion of the visit. If applicable, a reminder letter will be sent to the patient regarding the next appointment. BI-RADS CATEGORY  2: Benign. Electronically Signed   By: Curlene Dolphin M.D.   On: 04/08/2018 09:34    RESEARCH: enrolled in UPBEAT study; not a candidate for BR 003 because of concurrent cancer  ASSESSMENT: 67 y.o. [place] woman status post right breast upper outer quadrant biopsy 01/02/2016 for a clinical T2 N0, stage IIA invasive ductal carcinoma, grade 1, estrogen and progesterone receptor positive, HER-2 not amplified, with an MIB-1 of 10%.  (1). Right lumpectomy and sentinel lymph node sampling 01/22/2016 showed a pT1c pN0, stage IA invasive ductal carcinoma, grade 1, with close but negative margins. Repeat HER-2 testing was again not amplified   (3) Oncotype score of 18 predicts a risk of recurrence outside the breast within the next 10 years of 11% if  the patient's only systemic therapy is tamoxifen for 5 years. It also predicts no significant benefit from chemotherapy.     (4) adjuvant radiation3/14/17-4/11/17 Site/dose:   Right breast - 42.72 gy at 2.67 Gy per fraction x 16 fractions followed by an electron boost to the right breast with 10 Gy at 2 Gy per fraction x 5 fractions.    (5)  Anastrozole started 04/21/2016, discontinued after approximately 1 month with significant side effects  (a)  Bone density at [facility] September 2013 was normal  (b) bone density January 2018 was normal  (c) anastrozole restarted February 2018, held again at the start of chemotherapy February 2019  (6) genetics testing  02/24/2016 through the Breast/Ovarian cancer gene panel offered by [facility] found no deleterious mutations in ATM, BARD1, BRCA1, BRCA2, BRIP1, CDH1, CHEK2, EPCAM, FANCC, MLH1, MSH2, MSH6, NBN, PALB2, PMS2, PTEN, RAD51C, RAD51D, TP53, and XRCC2.   (7) left breast biopsy 12/17/2017 showed a cT2 cN0, stage IIA- IIB invasive ductal carcinoma, grade 2, estrogen receptor weakly positive, progesterone receptor negative, with HER-2 equivocal by FISH, negative by immunohistochemistry  (8) status post bilateral mastectomies and left sentinel lymph node sampling 01/04/2018 showing  (a) on the right, no  evidence of disease  (b) on the left, a pT2 pN0, stage IIB invasive ductal carcinoma, grade 3, triple negative  (9) adjuvant chemotherapy consisting of cyclophosphamide and doxorubicin in dose dense fashion x4 started 02/16/2018, completed 03/30/2018,  followed by weekly paclitaxel /carboplatin x12 starting 04/13/2018    PLAN: Samantha Sullivan did generally well with her first cycle of paclitaxel and carboplatin.  I do not have her labs today yet.  The fatigue she is experiencing may be due to anemia.  If so this should resolve over the next few weeks.  I do not think she is going to need to be transfused told that is a consideration.  As far as the eating  issue I have asked her not to force herself to eat.  She can take very small amounts.  It is important that she drink and she is doing that.  I did ask her to take Compazine 5 mg tonight and tomorrow morning just in case.  The pain she is having at the base of the nails is not neuropathy that is on the other side of the finger on the pads and she is not having any neuropathy symptoms so far.  She will be treated next week, without a visit, and then again 2 weeks from now, and I have scheduled her to see my 68 assistant at that time.  We should see her every 2 weeks during this weekly therapy until she gets to cycle #8 after which we will see her on a weekly basis  She knows to call for any other problems that may develop before the next visit.   Icelynn Onken, Virgie Dad, MD  04/20/18 8:27 AM Medical Oncology and Hematology [facility] [address] [place], [place] [address] Tel. [phone]    Fax. [phone]  This document serves as a record of services personally performed by Lurline Del, MD. It was created on his behalf by Sheron Nightingale, a trained medical scribe. The creation of this record is based on the scribe's personal observations and the provider's statements to them.   I have reviewed the above documentation for accuracy and completeness, and I agree with the above.

## 2018-04-22 ENCOUNTER — Ambulatory Visit: Payer: PPO | Admitting: Physical Therapy

## 2018-04-26 ENCOUNTER — Telehealth: Payer: Self-pay | Admitting: Obstetrics and Gynecology

## 2018-04-26 NOTE — Telephone Encounter (Signed)
Place patient in 10/2018 recall for annual exam.

## 2018-04-26 NOTE — Telephone Encounter (Signed)
Patient called advising she has received a letter to schedule her annual exam. Patient states she is currently under going chemo for breast cancer and request to defer scheduling her annual exam until November.  Routing to Triage Nurse

## 2018-04-27 ENCOUNTER — Inpatient Hospital Stay: Payer: PPO | Attending: Oncology

## 2018-04-27 ENCOUNTER — Inpatient Hospital Stay: Payer: PPO

## 2018-04-27 VITALS — BP 111/79 | HR 79 | Temp 98.9°F | Resp 18

## 2018-04-27 DIAGNOSIS — Z170 Estrogen receptor positive status [ER+]: Secondary | ICD-10-CM | POA: Insufficient documentation

## 2018-04-27 DIAGNOSIS — Z5189 Encounter for other specified aftercare: Secondary | ICD-10-CM | POA: Insufficient documentation

## 2018-04-27 DIAGNOSIS — Z9013 Acquired absence of bilateral breasts and nipples: Secondary | ICD-10-CM | POA: Insufficient documentation

## 2018-04-27 DIAGNOSIS — Z5111 Encounter for antineoplastic chemotherapy: Secondary | ICD-10-CM | POA: Insufficient documentation

## 2018-04-27 DIAGNOSIS — Z806 Family history of leukemia: Secondary | ICD-10-CM | POA: Diagnosis not present

## 2018-04-27 DIAGNOSIS — C50812 Malignant neoplasm of overlapping sites of left female breast: Secondary | ICD-10-CM

## 2018-04-27 DIAGNOSIS — R630 Anorexia: Secondary | ICD-10-CM | POA: Insufficient documentation

## 2018-04-27 DIAGNOSIS — Z923 Personal history of irradiation: Secondary | ICD-10-CM | POA: Insufficient documentation

## 2018-04-27 DIAGNOSIS — C50912 Malignant neoplasm of unspecified site of left female breast: Secondary | ICD-10-CM | POA: Insufficient documentation

## 2018-04-27 DIAGNOSIS — Z8041 Family history of malignant neoplasm of ovary: Secondary | ICD-10-CM | POA: Diagnosis not present

## 2018-04-27 DIAGNOSIS — Z171 Estrogen receptor negative status [ER-]: Secondary | ICD-10-CM | POA: Insufficient documentation

## 2018-04-27 DIAGNOSIS — C50411 Malignant neoplasm of upper-outer quadrant of right female breast: Secondary | ICD-10-CM

## 2018-04-27 LAB — CMP (CANCER CENTER ONLY)
ALBUMIN: 3.2 g/dL — AB (ref 3.5–5.0)
ALK PHOS: 94 U/L (ref 40–150)
ALT: 18 U/L (ref 0–55)
AST: 22 U/L (ref 5–34)
Anion gap: 7 (ref 3–11)
BILIRUBIN TOTAL: 0.4 mg/dL (ref 0.2–1.2)
BUN: 7 mg/dL (ref 7–26)
CO2: 26 mmol/L (ref 22–29)
Calcium: 9.5 mg/dL (ref 8.4–10.4)
Chloride: 104 mmol/L (ref 98–109)
Creatinine: 0.67 mg/dL (ref 0.60–1.10)
GFR, Est AFR Am: >60 mL/min (ref >60)
GFR, Estimated: >60 mL/min (ref >60)
GLUCOSE: 99 mg/dL (ref 70–140)
POTASSIUM: 3.7 mmol/L (ref 3.5–5.1)
Sodium: 137 mmol/L (ref 136–145)
TOTAL PROTEIN: 6 g/dL — AB (ref 6.4–8.3)

## 2018-04-27 LAB — CBC WITH DIFFERENTIAL (CANCER CENTER ONLY)
BASOS ABS: 0.1 10*3/uL (ref 0.0–0.1)
BASOS PCT: 2 %
Eosinophils Absolute: 0 10*3/uL (ref 0.0–0.5)
Eosinophils Relative: 1 %
HEMATOCRIT: 28.6 % — AB (ref 34.8–46.6)
HEMOGLOBIN: 9.4 g/dL — AB (ref 11.6–15.9)
Lymphocytes Relative: 14 %
Lymphs Abs: 0.4 10*3/uL — ABNORMAL LOW (ref 0.9–3.3)
MCH: 30.1 pg (ref 25.1–34.0)
MCHC: 32.9 g/dL (ref 31.5–36.0)
MCV: 91.7 fL (ref 79.5–101.0)
Monocytes Absolute: 0.6 10*3/uL (ref 0.1–0.9)
Monocytes Relative: 21 %
NEUTROS ABS: 1.9 10*3/uL (ref 1.5–6.5)
Neutrophils Relative %: 62 %
Platelet Count: 312 10*3/uL (ref 145–400)
RBC: 3.12 MIL/uL — AB (ref 3.70–5.45)
RDW: 18 % — AB (ref 11.2–14.5)
WBC Count: 3.1 10*3/uL — ABNORMAL LOW (ref 3.9–10.3)

## 2018-04-27 MED ORDER — DEXAMETHASONE SODIUM PHOSPHATE 10 MG/ML IJ SOLN
10.0000 mg | Freq: Once | INTRAMUSCULAR | Status: AC
  Administered 2018-04-27: 10 mg via INTRAVENOUS

## 2018-04-27 MED ORDER — DIPHENHYDRAMINE HCL 50 MG/ML IJ SOLN
INTRAMUSCULAR | Status: AC
  Filled 2018-04-27: qty 1

## 2018-04-27 MED ORDER — SODIUM CHLORIDE 0.9 % IV SOLN
80.0000 mg/m2 | Freq: Once | INTRAVENOUS | Status: AC
  Administered 2018-04-27: 156 mg via INTRAVENOUS
  Filled 2018-04-27: qty 26

## 2018-04-27 MED ORDER — DIPHENHYDRAMINE HCL 50 MG/ML IJ SOLN
25.0000 mg | Freq: Once | INTRAMUSCULAR | Status: AC
  Administered 2018-04-27: 25 mg via INTRAVENOUS

## 2018-04-27 MED ORDER — FAMOTIDINE IN NACL 20-0.9 MG/50ML-% IV SOLN
INTRAVENOUS | Status: AC
  Filled 2018-04-27: qty 50

## 2018-04-27 MED ORDER — SODIUM CHLORIDE 0.9% FLUSH
10.0000 mL | INTRAVENOUS | Status: DC | PRN
  Administered 2018-04-27: 10 mL
  Filled 2018-04-27: qty 10

## 2018-04-27 MED ORDER — SODIUM CHLORIDE 0.9 % IV SOLN
Freq: Once | INTRAVENOUS | Status: AC
  Administered 2018-04-27: 09:00:00 via INTRAVENOUS

## 2018-04-27 MED ORDER — SODIUM CHLORIDE 0.9 % IV SOLN
192.2000 mg | Freq: Once | INTRAVENOUS | Status: AC
  Administered 2018-04-27: 190 mg via INTRAVENOUS
  Filled 2018-04-27: qty 19

## 2018-04-27 MED ORDER — DEXAMETHASONE SODIUM PHOSPHATE 10 MG/ML IJ SOLN
INTRAMUSCULAR | Status: AC
  Filled 2018-04-27: qty 1

## 2018-04-27 MED ORDER — FAMOTIDINE IN NACL 20-0.9 MG/50ML-% IV SOLN
20.0000 mg | Freq: Once | INTRAVENOUS | Status: AC
  Administered 2018-04-27: 20 mg via INTRAVENOUS

## 2018-04-27 MED ORDER — HEPARIN SOD (PORK) LOCK FLUSH 100 UNIT/ML IV SOLN
500.0000 [IU] | Freq: Once | INTRAVENOUS | Status: AC | PRN
  Administered 2018-04-27: 500 [IU]
  Filled 2018-04-27: qty 5

## 2018-04-27 NOTE — Patient Instructions (Signed)
[facility] Discharge Instructions for Patients Receiving Chemotherapy  Today you received the following chemotherapy agents Taxol and Carcoplatin  To help prevent nausea and vomiting after your treatment, we encourage you to take your nausea medication as prescribed. If you develop nausea and vomiting that is not controlled by your nausea medication, call the clinic.   BELOW ARE SYMPTOMS THAT SHOULD BE REPORTED IMMEDIATELY:  *FEVER GREATER THAN 100.5 F  *CHILLS WITH OR WITHOUT FEVER  NAUSEA AND VOMITING THAT IS NOT CONTROLLED WITH YOUR NAUSEA MEDICATION  *UNUSUAL SHORTNESS OF BREATH  *UNUSUAL BRUISING OR BLEEDING  TENDERNESS IN MOUTH AND THROAT WITH OR WITHOUT PRESENCE OF ULCERS  *URINARY PROBLEMS  *BOWEL PROBLEMS  UNUSUAL RASH Items with * indicate a potential emergency and should be followed up as soon as possible.  Feel free to call the clinic should you have any questions or concerns. The clinic phone number is [phone].  Please show the [facility] at check-in to the Emergency Department and triage nurse.  Paclitaxel injection (Taxol) What is this medicine? PACLITAXEL (PAK li TAX el) is a chemotherapy drug. It targets fast dividing cells, like cancer cells, and causes these cells to die. This medicine is used to treat ovarian cancer, breast cancer, and other cancers. This medicine may be used for other purposes; ask your health care provider or pharmacist if you have questions. COMMON BRAND NAME(S): Onxol, Taxol What should I tell my health care provider before I take this medicine? They need to know if you have any of these conditions: -blood disorders -irregular heartbeat -infection (especially a virus infection such as chickenpox, cold sores, or herpes) -liver disease -previous or ongoing radiation therapy -an unusual or allergic reaction to paclitaxel, alcohol, polyoxyethylated castor oil, other chemotherapy agents, other medicines,  foods, dyes, or preservatives -pregnant or trying to get pregnant -breast-feeding How should I use this medicine? This drug is given as an infusion into a vein. It is administered in a hospital or clinic by a specially trained health care professional. Talk to your pediatrician regarding the use of this medicine in children. Special care may be needed. Overdosage: If you think you have taken too much of this medicine contact a poison control center or emergency room at once. NOTE: This medicine is only for you. Do not share this medicine with others. What if I miss a dose? It is important not to miss your dose. Call your doctor or health care professional if you are unable to keep an appointment. What may interact with this medicine? Do not take this medicine with any of the following medications: -disulfiram -metronidazole This medicine may also interact with the following medications: -cyclosporine -diazepam -ketoconazole -medicines to increase blood counts like filgrastim, pegfilgrastim, sargramostim -other chemotherapy drugs like cisplatin, doxorubicin, epirubicin, etoposide, teniposide, vincristine -quinidine -testosterone -vaccines -verapamil Talk to your doctor or health care professional before taking any of these medicines: -acetaminophen -aspirin -ibuprofen -ketoprofen -naproxen This list may not describe all possible interactions. Give your health care provider a list of all the medicines, herbs, non-prescription drugs, or dietary supplements you use. Also tell them if you smoke, drink alcohol, or use illegal drugs. Some items may interact with your medicine. What should I watch for while using this medicine? Your condition will be monitored carefully while you are receiving this medicine. You will need important blood work done while you are taking this medicine. This medicine can cause serious allergic reactions. To reduce your risk you will need to  take other  medicine(s) before treatment with this medicine. If you experience allergic reactions like skin rash, itching or hives, swelling of the face, lips, or tongue, tell your doctor or health care professional right away. In some cases, you may be given additional medicines to help with side effects. Follow all directions for their use. This drug may make you feel generally unwell. This is not uncommon, as chemotherapy can affect healthy cells as well as cancer cells. Report any side effects. Continue your course of treatment even though you feel ill unless your doctor tells you to stop. Call your doctor or health care professional for advice if you get a fever, chills or sore throat, or other symptoms of a cold or flu. Do not treat yourself. This drug decreases your body's ability to fight infections. Try to avoid being around people who are sick. This medicine may increase your risk to bruise or bleed. Call your doctor or health care professional if you notice any unusual bleeding. Be careful brushing and flossing your teeth or using a toothpick because you may get an infection or bleed more easily. If you have any dental work done, tell your dentist you are receiving this medicine. Avoid taking products that contain aspirin, acetaminophen, ibuprofen, naproxen, or ketoprofen unless instructed by your doctor. These medicines may hide a fever. Do not become pregnant while taking this medicine. Women should inform their doctor if they wish to become pregnant or think they might be pregnant. There is a potential for serious side effects to an unborn child. Talk to your health care professional or pharmacist for more information. Do not breast-feed an infant while taking this medicine. Men are advised not to father a child while receiving this medicine. This product may contain alcohol. Ask your pharmacist or healthcare provider if this medicine contains alcohol. Be sure to tell all healthcare providers you are  taking this medicine. Certain medicines, like metronidazole and disulfiram, can cause an unpleasant reaction when taken with alcohol. The reaction includes flushing, headache, nausea, vomiting, sweating, and increased thirst. The reaction can last from 30 minutes to several hours. What side effects may I notice from receiving this medicine? Side effects that you should report to your doctor or health care professional as soon as possible: -allergic reactions like skin rash, itching or hives, swelling of the face, lips, or tongue -low blood counts - This drug may decrease the number of white blood cells, red blood cells and platelets. You may be at increased risk for infections and bleeding. -signs of infection - fever or chills, cough, sore throat, pain or difficulty passing urine -signs of decreased platelets or bleeding - bruising, pinpoint red spots on the skin, black, tarry stools, nosebleeds -signs of decreased red blood cells - unusually weak or tired, fainting spells, lightheadedness -breathing problems -chest pain -high or low blood pressure -mouth sores -nausea and vomiting -pain, swelling, redness or irritation at the injection site -pain, tingling, numbness in the hands or feet -slow or irregular heartbeat -swelling of the ankle, feet, hands Side effects that usually do not require medical attention (report to your doctor or health care professional if they continue or are bothersome): -bone pain -complete hair loss including hair on your head, underarms, pubic hair, eyebrows, and eyelashes -changes in the color of fingernails -diarrhea -loosening of the fingernails -loss of appetite -muscle or joint pain -red flush to skin -sweating This list may not describe all possible side effects. Call your doctor for medical advice about  side effects. You may report side effects to FDA at 1-800-FDA-1088. Where should I keep my medicine? This drug is given in a hospital or clinic and  will not be stored at home. NOTE: This sheet is a summary. It may not cover all possible information. If you have questions about this medicine, talk to your doctor, pharmacist, or health care provider.  2018 [employer]/Gold Standard (2015-10-09 19:58:00)   Carboplatin injection What is this medicine? CARBOPLATIN (KAR boe pla tin) is a chemotherapy drug. It targets fast dividing cells, like cancer cells, and causes these cells to die. This medicine is used to treat ovarian cancer and many other cancers. This medicine may be used for other purposes; ask your health care provider or pharmacist if you have questions. COMMON BRAND NAME(S): Paraplatin What should I tell my health care provider before I take this medicine? They need to know if you have any of these conditions: -blood disorders -hearing problems -kidney disease -recent or ongoing radiation therapy -an unusual or allergic reaction to carboplatin, cisplatin, other chemotherapy, other medicines, foods, dyes, or preservatives -pregnant or trying to get pregnant -breast-feeding How should I use this medicine? This drug is usually given as an infusion into a vein. It is administered in a hospital or clinic by a specially trained health care professional. Talk to your pediatrician regarding the use of this medicine in children. Special care may be needed. Overdosage: If you think you have taken too much of this medicine contact a poison control center or emergency room at once. NOTE: This medicine is only for you. Do not share this medicine with others. What if I miss a dose? It is important not to miss a dose. Call your doctor or health care professional if you are unable to keep an appointment. What may interact with this medicine? -medicines for seizures -medicines to increase blood counts like filgrastim, pegfilgrastim, sargramostim -some antibiotics like amikacin, gentamicin, neomycin, streptomycin, tobramycin -vaccines Talk to  your doctor or health care professional before taking any of these medicines: -acetaminophen -aspirin -ibuprofen -ketoprofen -naproxen This list may not describe all possible interactions. Give your health care provider a list of all the medicines, herbs, non-prescription drugs, or dietary supplements you use. Also tell them if you smoke, drink alcohol, or use illegal drugs. Some items may interact with your medicine. What should I watch for while using this medicine? Your condition will be monitored carefully while you are receiving this medicine. You will need important blood work done while you are taking this medicine. This drug may make you feel generally unwell. This is not uncommon, as chemotherapy can affect healthy cells as well as cancer cells. Report any side effects. Continue your course of treatment even though you feel ill unless your doctor tells you to stop. In some cases, you may be given additional medicines to help with side effects. Follow all directions for their use. Call your doctor or health care professional for advice if you get a fever, chills or sore throat, or other symptoms of a cold or flu. Do not treat yourself. This drug decreases your body's ability to fight infections. Try to avoid being around people who are sick. This medicine may increase your risk to bruise or bleed. Call your doctor or health care professional if you notice any unusual bleeding. Be careful brushing and flossing your teeth or using a toothpick because you may get an infection or bleed more easily. If you have any dental work done, tell your dentist  you are receiving this medicine. Avoid taking products that contain aspirin, acetaminophen, ibuprofen, naproxen, or ketoprofen unless instructed by your doctor. These medicines may hide a fever. Do not become pregnant while taking this medicine. Women should inform their doctor if they wish to become pregnant or think they might be pregnant. There is a  potential for serious side effects to an unborn child. Talk to your health care professional or pharmacist for more information. Do not breast-feed an infant while taking this medicine. What side effects may I notice from receiving this medicine? Side effects that you should report to your doctor or health care professional as soon as possible: -allergic reactions like skin rash, itching or hives, swelling of the face, lips, or tongue -signs of infection - fever or chills, cough, sore throat, pain or difficulty passing urine -signs of decreased platelets or bleeding - bruising, pinpoint red spots on the skin, black, tarry stools, nosebleeds -signs of decreased red blood cells - unusually weak or tired, fainting spells, lightheadedness -breathing problems -changes in hearing -changes in vision -chest pain -high blood pressure -low blood counts - This drug may decrease the number of white blood cells, red blood cells and platelets. You may be at increased risk for infections and bleeding. -nausea and vomiting -pain, swelling, redness or irritation at the injection site -pain, tingling, numbness in the hands or feet -problems with balance, talking, walking -trouble passing urine or change in the amount of urine Side effects that usually do not require medical attention (report to your doctor or health care professional if they continue or are bothersome): -hair loss -loss of appetite -metallic taste in the mouth or changes in taste This list may not describe all possible side effects. Call your doctor for medical advice about side effects. You may report side effects to FDA at 1-800-FDA-1088. Where should I keep my medicine? This drug is given in a hospital or clinic and will not be stored at home. NOTE: This sheet is a summary. It may not cover all possible information. If you have questions about this medicine, talk to your doctor, pharmacist, or health care provider.  2018 [employer]/Gold  Standard (2008-03-14 14:38:05)  

## 2018-05-04 ENCOUNTER — Inpatient Hospital Stay: Payer: PPO

## 2018-05-04 ENCOUNTER — Telehealth: Payer: Self-pay | Admitting: Adult Health

## 2018-05-04 ENCOUNTER — Inpatient Hospital Stay (HOSPITAL_BASED_OUTPATIENT_CLINIC_OR_DEPARTMENT_OTHER): Payer: PPO | Admitting: Adult Health

## 2018-05-04 ENCOUNTER — Other Ambulatory Visit: Payer: Self-pay | Admitting: *Deleted

## 2018-05-04 ENCOUNTER — Other Ambulatory Visit: Payer: Self-pay | Admitting: Adult Health

## 2018-05-04 ENCOUNTER — Encounter: Payer: Self-pay | Admitting: Adult Health

## 2018-05-04 VITALS — BP 131/81 | HR 90 | Temp 97.9°F | Resp 18 | Ht 65.0 in | Wt 162.5 lb

## 2018-05-04 DIAGNOSIS — Z806 Family history of leukemia: Secondary | ICD-10-CM

## 2018-05-04 DIAGNOSIS — Z170 Estrogen receptor positive status [ER+]: Principal | ICD-10-CM

## 2018-05-04 DIAGNOSIS — Z923 Personal history of irradiation: Secondary | ICD-10-CM

## 2018-05-04 DIAGNOSIS — Z9013 Acquired absence of bilateral breasts and nipples: Secondary | ICD-10-CM

## 2018-05-04 DIAGNOSIS — Z171 Estrogen receptor negative status [ER-]: Secondary | ICD-10-CM

## 2018-05-04 DIAGNOSIS — C50912 Malignant neoplasm of unspecified site of left female breast: Secondary | ICD-10-CM

## 2018-05-04 DIAGNOSIS — Z95828 Presence of other vascular implants and grafts: Secondary | ICD-10-CM

## 2018-05-04 DIAGNOSIS — Z8041 Family history of malignant neoplasm of ovary: Secondary | ICD-10-CM | POA: Diagnosis not present

## 2018-05-04 DIAGNOSIS — R630 Anorexia: Secondary | ICD-10-CM

## 2018-05-04 DIAGNOSIS — C50411 Malignant neoplasm of upper-outer quadrant of right female breast: Secondary | ICD-10-CM

## 2018-05-04 DIAGNOSIS — Z5111 Encounter for antineoplastic chemotherapy: Secondary | ICD-10-CM | POA: Diagnosis not present

## 2018-05-04 DIAGNOSIS — C50812 Malignant neoplasm of overlapping sites of left female breast: Secondary | ICD-10-CM

## 2018-05-04 LAB — CBC WITH DIFFERENTIAL (CANCER CENTER ONLY)
Basophils Absolute: 0.1 10*3/uL (ref 0.0–0.1)
Basophils Relative: 2 %
EOS ABS: 0 10*3/uL (ref 0.0–0.5)
Eosinophils Relative: 2 %
HEMATOCRIT: 27.8 % — AB (ref 34.8–46.6)
HEMOGLOBIN: 9.3 g/dL — AB (ref 11.6–15.9)
LYMPHS ABS: 0.7 10*3/uL — AB (ref 0.9–3.3)
Lymphocytes Relative: 24 %
MCH: 31.3 pg (ref 25.1–34.0)
MCHC: 33.5 g/dL (ref 31.5–36.0)
MCV: 93.6 fL (ref 79.5–101.0)
MONOS PCT: 12 %
Monocytes Absolute: 0.3 10*3/uL (ref 0.1–0.9)
NEUTROS PCT: 60 %
Neutro Abs: 1.7 10*3/uL (ref 1.5–6.5)
Platelet Count: 240 10*3/uL (ref 145–400)
RBC: 2.97 MIL/uL — ABNORMAL LOW (ref 3.70–5.45)
RDW: 18.3 % — ABNORMAL HIGH (ref 11.2–14.5)
WBC Count: 2.7 10*3/uL — ABNORMAL LOW (ref 3.9–10.3)

## 2018-05-04 LAB — CMP (CANCER CENTER ONLY)
ALK PHOS: 102 U/L (ref 40–150)
ALT: 17 U/L (ref 0–55)
AST: 20 U/L (ref 5–34)
Albumin: 3.4 g/dL — ABNORMAL LOW (ref 3.5–5.0)
Anion gap: 7 (ref 3–11)
BUN: 10 mg/dL (ref 7–26)
CALCIUM: 9.4 mg/dL (ref 8.4–10.4)
CO2: 26 mmol/L (ref 22–29)
CREATININE: 0.64 mg/dL (ref 0.60–1.10)
Chloride: 104 mmol/L (ref 98–109)
Glucose, Bld: 97 mg/dL (ref 70–140)
Potassium: 3.8 mmol/L (ref 3.5–5.1)
Sodium: 137 mmol/L (ref 136–145)
Total Bilirubin: 0.5 mg/dL (ref 0.2–1.2)
Total Protein: 6 g/dL — ABNORMAL LOW (ref 6.4–8.3)

## 2018-05-04 LAB — RESEARCH LABS

## 2018-05-04 MED ORDER — LORAZEPAM 0.5 MG PO TABS: 0.5000 mg | ORAL_TABLET | Freq: Every evening | ORAL | 0 refills | Status: DC | PRN

## 2018-05-04 MED ORDER — SODIUM CHLORIDE 0.9% FLUSH
10.0000 mL | INTRAVENOUS | Status: DC | PRN
  Administered 2018-05-04: 10 mL
  Filled 2018-05-04: qty 10

## 2018-05-04 MED ORDER — LIDOCAINE-PRILOCAINE 2.5-2.5 % EX CREA: TOPICAL_CREAM | CUTANEOUS | 3 refills | Status: DC

## 2018-05-04 MED ORDER — HEPARIN SOD (PORK) LOCK FLUSH 100 UNIT/ML IV SOLN
500.0000 [IU] | Freq: Once | INTRAVENOUS | Status: AC | PRN
  Administered 2018-05-04: 500 [IU]
  Filled 2018-05-04: qty 5

## 2018-05-04 MED ORDER — DIPHENHYDRAMINE HCL 50 MG/ML IJ SOLN
INTRAMUSCULAR | Status: AC
  Filled 2018-05-04: qty 1

## 2018-05-04 MED ORDER — FAMOTIDINE IN NACL 20-0.9 MG/50ML-% IV SOLN
INTRAVENOUS | Status: AC
  Filled 2018-05-04: qty 50

## 2018-05-04 MED ORDER — DEXAMETHASONE SODIUM PHOSPHATE 10 MG/ML IJ SOLN
INTRAMUSCULAR | Status: AC
  Filled 2018-05-04: qty 1

## 2018-05-04 NOTE — Progress Notes (Signed)
[place]  [phone] [phone]     ID: [name] DOB: 1951/10/13  MR#: [id number]  BSW#:[id number]  Patient Care Team: Shon Baton, MD as PCP - General (Internal Medicine) Magrinat, Virgie Dad, MD as Consulting Physician (Oncology) Kem Boroughs, [place] as Nurse Practitioner (Family Medicine) Regal, Tamala Fothergill, DPM as Consulting Physician (Podiatry) Excell Seltzer, MD as Consulting Physician (General Surgery) Force, Shari Prows, DO as Referring Physician (Student) PCP: Shon Baton, MD OTHER MD:  CHIEF COMPLAINT: Contralateral breast cancer, triple negative  CURRENT TREATMENT: Adjuvant chemotherapy  INTERVAL HISTORY: Samantha Sullivan returns today for follow-up of her triple negative breast cancer. She completed treatment with cycle 4 of 4 planned cycles of cyclophosphamide and doxorubicin on 03/30/2018 is now here to receive 12 planned doses of carboplatin and paclitaxel, with dose #4 due today.  Samantha Sullivan is doing moderately well today.  She does have a continued pain at the base of her nailbed.  Also, however, she woke up this morning and noted a mild constant peripheral neuropathy in the tips of her fingers.  She denies any motor deficits, or any peripheral neuropathy in her toes.      REVIEW OF SYSTEMS: Samantha Sullivan is doing well today.  She does have a decreased appetite and taste changes.  She has been struggling to eat.  She is doing her best.  She was able to eat a bowl of oatmeal last night and is happy with this.  She denies any fevers, chills, cough, chest pain, palpitations, shortness of breath, or abdominal symptoms.  A detailed ROS was otherwise non contributory.     RIGHT BREAST CANCER HISTORY:  From the original intake note:  "[name]"noted a lump in her right breast sometime in late October or early November, but there were "so many things going on in her life" she did not bring it to medical attention until she saw [name] 12/11/2015. [name]  was able to palpate a mass at the 9:30 o'clock position in the right breast, which was mobile and nontender. The left breast is status post prior remote biopsy and there was some scar tissue at the 7:00 position. The patient was set up for bilateral diagnostic Mammography with tomosynthesis at [facility] 12/27/2015. This found the breast density to be category C. There was a new oval mass in the right breast upper outer quadrant. Ultrasound the same day confirmed a 2.2 cm mass with irregular margins in the right breast at the 9:00 position read as highly suggestive of malignancy.  Biopsy of the right breast mass in question 01/02/2016 found ([id number]) an invasive ductal carcinoma, grade 1, estrogen receptor 100% positive, progesterone receptor 90% positive, both with strong staining intensity, with an MIB-1 of 10%, and no HER-2 amplification, the signals ratio being 1.32 and the number per cell 2.05. The patient was informed and instructed to stop hormone replacement therapy (last dose 01/04/2016).  Her subsequent history is as detailed below.    PAST MEDICAL HISTORY: Past Medical History:  Diagnosis Date  . Abnormal CT of the chest 12/06   numeroous lung nodules - most have resolved.  . Arthritis    psoriatic arthritis- toes & feet, but resolved at this point- no need meds at this point    . Cancer [facility])    right breast cancer  . Complication of anesthesia    /w colonoscopy- woke up before procedure was complete  . Family history of adverse reaction to anesthesia    pt's mother reported difficulty  being given " enough " medicine to outcome a successful anesth. experience  . Family history of leukemia   . Family history of ovarian cancer   . History of meningioma 03-13-06   in 03/13/12 stable on MRI and recheck in 5 years, no surgery necessry   . HSV infection 1970's- 1980's  . Psoriatic arthritis ([place])     PAST SURGICAL HISTORY: Past Surgical History:  Procedure Laterality Date  . BILATERAL  TOTAL MASTECTOMY WITH AXILLARY LYMPH NODE DISSECTION Bilateral 01/04/2018   Procedure: BILATERAL TOTAL MASTECTOMY PROPHYLATIC RIGHT  WITH LEFT SENTINEAL  LYMPH NODE;  Surgeon: Excell Seltzer, MD;  Location: [place];  Service: General;  Laterality: Bilateral;  . BREAST LUMPECTOMY WITH RADIOACTIVE SEED AND SENTINEL LYMPH NODE BIOPSY Right 01/22/2016   Procedure: RIGHT BREAST LUMPECTOMY WITH RADIOACTIVE SEED AND SENTINEL LYMPH NODE BIOPSY;  Surgeon: Excell Seltzer, MD;  Location: [place];  Service: General;  Laterality: Right;  . BREAST SURGERY Left 1971, 03/14/2015   lumpectomy benign, 03-14-2015- [facility]. [facility]- R lumpectomy & radiation   . COLONOSCOPY  8/06   normal and recheck in 10 years  . HAMMER TOE SURGERY Left 2015/03/14  . MASTECTOMY Right 01/04/2018   PROPHYLATIC   . MASTECTOMY COMPLETE / SIMPLE W/ SENTINEL NODE BIOPSY Left 01/04/2018  . PORTACATH PLACEMENT Right 01/04/2018   Procedure: INSERTION PORT-A-CATH;  Surgeon: Excell Seltzer, MD;  Location: [place];  Service: General;  Laterality: Right;  . THYROIDECTOMY, PARTIAL Left 1983    FAMILY HISTORY Family History  Problem Relation Age of Onset  . Osteoporosis Mother   . Osteoporosis Sister   . Lung cancer Maternal Aunt        smoker  . Heart Problems Maternal Grandmother   . COPD Maternal Grandfather   . Neurodegenerative disease Maternal Aunt        corical-ganglion degeneration  . Ovarian cancer Maternal Aunt        dx in her 19s  . Leukemia Cousin        67s-40s; maternal cousin  [name] has essentially no information on her father's side of the family aside from the fact that he died in his 54s from heart disease. He had psoriasis. The patient's mother passed away 03/13/2017 age 34, due to breast cancer. The patient had 2 brothers who died from congenital heart disease shortly after birth. The patient has one sister. The patient's mother had one sister, the patient's aunt, diagnosed with ovarian cancer in her  54s.  GYNECOLOGIC HISTORY:  Patient's last menstrual period was 03/22/2000 (approximate). Menarche age 42, first live birth age 33. The patient is GX P2. She went through menopause approximately 13-Mar-1989 but was taking hormone replacement until 01/04/2016. Since coming off replacement she has had insomnia but no hot flashes or vaginal dryness pleural bones so 4. 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 She is divorced and lives alone, with no pets. Her daughter [name] lives in [place]. She is disabled secondary to schizoaffective disorder. Daughter [name] lives in [place] and she is a Estate agent but mostly a homemaker. The patient has 3 grandchildren, the older 51, the other 2 being under 70 years old. The patient attends [employer]. Her mother passed away in March 13, 2017 due to breast cancer.    ADVANCED DIRECTIVES: her daughter [name] is her healthcare power of  attorney. She can be reached at [place]: Social History   Tobacco Use  . Smoking status: Former Smoker    Packs/day: 1.00    Years: 12.00    Pack years: 12.00    Types: Cigarettes    Last attempt to quit: 07/23/1981    Years since quitting: 36.8  . Smokeless tobacco: Never Used  Substance Use Topics  . Alcohol use: Yes    Alcohol/week: 0.0 oz    Comment: social occasion only- special   . Drug use: No     Colonoscopy: ; [facility]  TIR:WERXVQM 2016/[place]  Bone density:rJanuary 2018/normal   Lipid panel:  Allergies  Allergen Reactions  . Bee Venom   . Ciprofloxacin   . Levaquin [Levofloxacin In D5w]   . Norco [Hydrocodone-Acetaminophen] Other (See Comments)    Pt states Norco made her extremely hyper after her surgery and she was not able to sleep.    Current Outpatient Medications  Medication Sig  Dispense Refill  . FLURANDRENOLIDE EX Apply 1 application topically 2 (two) times daily as needed (for psoriasis). Cordran Tape    . folic acid (FOLVITE) 1 MG tablet Take 2 mg by mouth every evening.    . lidocaine-prilocaine (EMLA) cream Apply to affected area once 30 g 3  . LORazepam (ATIVAN) 0.5 MG tablet Take 1 tablet (0.5 mg total) by mouth at bedtime as needed and may repeat dose one time if needed (Nausea or vomiting). 10 tablet 0  . Polyethyl Glycol-Propyl Glycol (LUBRICANT EYE DROPS) 0.4-0.3 % SOLN Place 1-2 drops into both eyes 3 (three) times daily as needed (dry eyes/irritated eyes.).    Marland Kitchen prochlorperazine (COMPAZINE) 10 MG tablet Take 1 tablet (10 mg total) by mouth every 6 (six) hours as needed (Nausea or vomiting). 30 tablet 1  . traMADol (ULTRAM) 50 MG tablet Take 1 tablet (50 mg total) by mouth every 6 (six) hours as needed (mild pain). 20 tablet 0   Current Facility-Administered Medications  Medication Dose Route Frequency Provider Last Rate Last Dose  . triamcinolone acetonide (KENALOG) 10 MG/ML injection 10 mg  10 mg Other Once Wallene Huh, DPM        OBJECTIVE:  Vitals:   05/04/18 0838  BP: 131/81  Sullivan: 90  Resp: 18  Temp: 97.9 F (36.6 C)  SpO2: 100%     Body mass index is 27.04 kg/m.   ECOG FS:1 - Symptomatic but completely ambulatory GENERAL: Patient is a well appearing female in no acute distress HEENT:  Sclerae anicteric.  Oropharynx clear and moist. No ulcerations or evidence of oropharyngeal candidiasis. Neck is supple.  NODES:  No cervical, supraclavicular, or axillary lymphadenopathy palpated.  BREAST EXAM:  Deferred. LUNGS:  Clear to auscultation bilaterally.  No wheezes or rhonchi. HEART:  Regular rate and rhythm. No murmur appreciated. ABDOMEN:  Soft, nontender.  Positive, normoactive bowel sounds. No organomegaly palpated. MSK:  No focal spinal tenderness to palpation. Full range of motion bilaterally in the upper extremities. EXTREMITIES:  No  peripheral edema.   SKIN:  Clear with no obvious rashes or skin changes. No nail dyscrasia. NEURO:  Nonfocal. Well oriented.  Appropriate affect.     LAB RESULTS:  CMP     Component Value Date/Time   NA 137 04/27/2018 0908   NA 141 08/05/2017 1335   K 3.7 04/27/2018 0908   K 4.5 08/05/2017 1335   CL 104 04/27/2018 0908   CO2 26 04/27/2018 0908   CO2 29 08/05/2017  1335   GLUCOSE 99 04/27/2018 0908   GLUCOSE 77 08/05/2017 1335   BUN 7 04/27/2018 0908   BUN 13.4 08/05/2017 1335   CREATININE 0.67 04/27/2018 0908   CREATININE 0.8 08/05/2017 1335   CALCIUM 9.5 04/27/2018 0908   CALCIUM 10.0 08/05/2017 1335   PROT 6.0 (L) 04/27/2018 0908   PROT 6.8 08/05/2017 1335   ALBUMIN 3.2 (L) 04/27/2018 0908   ALBUMIN 3.6 08/05/2017 1335   AST 22 04/27/2018 0908   AST 19 08/05/2017 1335   ALT 18 04/27/2018 0908   ALT 12 08/05/2017 1335   ALKPHOS 94 04/27/2018 0908   ALKPHOS 169 (H) 08/05/2017 1335   BILITOT 0.4 04/27/2018 0908   BILITOT 0.63 08/05/2017 1335   GFRNONAA >60 04/27/2018 0908   GFRAA >60 04/27/2018 0908    INo results found for: SPEP, UPEP  Lab Results  Component Value Date   WBC 2.7 (L) 05/04/2018   NEUTROABS 1.7 05/04/2018   HGB 9.3 (L) 05/04/2018   HCT 27.8 (L) 05/04/2018   MCV 93.6 05/04/2018   PLT 240 05/04/2018      Chemistry      Component Value Date/Time   NA 137 04/27/2018 0908   NA 141 08/05/2017 1335   K 3.7 04/27/2018 0908   K 4.5 08/05/2017 1335   CL 104 04/27/2018 0908   CO2 26 04/27/2018 0908   CO2 29 08/05/2017 1335   BUN 7 04/27/2018 0908   BUN 13.4 08/05/2017 1335   CREATININE 0.67 04/27/2018 0908   CREATININE 0.8 08/05/2017 1335      Component Value Date/Time   CALCIUM 9.5 04/27/2018 0908   CALCIUM 10.0 08/05/2017 1335   ALKPHOS 94 04/27/2018 0908   ALKPHOS 169 (H) 08/05/2017 1335   AST 22 04/27/2018 0908   AST 19 08/05/2017 1335   ALT 18 04/27/2018 0908   ALT 12 08/05/2017 1335   BILITOT 0.4 04/27/2018 0908   BILITOT 0.63  08/05/2017 1335       No results found for: LABCA2  No components found for: LABCA125  No results for input(s): INR in the last 168 hours.  Urinalysis    Component Value Date/Time   BILIRUBINUR neg 12/11/2015 0925   PROTEINUR neg 12/11/2015 0925   UROBILINOGEN negative 12/11/2015 0925   NITRITE neg 12/11/2015 0925   LEUKOCYTESUR Negative 12/11/2015 0925    STUDIES: US Breast Ltd Uni Right Inc Axilla  Result Date: 04/08/2018 CLINICAL DATA:  67 year old patient has a history of bilateral breast cancer, most recently diagnosed on the left in December of 2018. In January 2019, she underwent bilateral mastectomies, the right mastectomy was prophylactic. She states that a postoperative seroma was drained on the right side after surgery. Currently, she presents for evaluation of a possible seroma on the right. Her primary area of concern is the anterior right chest wall inferior to her right-sided Port-A-Cath and medial to her right axilla. She feels some fullness and sensitivity in this region and wonders if something is pushing up on her port. EXAM: ULTRASOUND OF THE RIGHT BREAST (MASTECTOMY) COMPARISON:  Outside imaging of the left breast from [facility] December 2018. FINDINGS: On physical exam, there are postoperative changes of a healed right mastectomy and healed right axillary scar. Right-sided Port-A-Cath is visible. On the anterior right chest wall, inferior to the Port-A-Cath, I palpate soft subcutaneous tissues. No dominant or definite mass is palpated in the right axilla. Targeted ultrasound is performed with the patient lying supine and sitting upright. Normal subcutaneous  fat and muscle is seen on evaluation of the anterior right chest wall in the region of patient concern. There is no postoperative seroma or hematoma of the anterior chest wall or in the tissue surrounding the Port-A-Cath. Best imaged when the patient is supine, with the right arm raised, is a simple  crescentic and nondistended appearing fluid collection in the inferior and medial right axilla, consistent with a postoperative seroma. This measures approximately 3.3 x 3.4 x 0.8 cm. No lymphadenopathy or suspicious mass. IMPRESSION: 1. The area of predominant concern to the patient in the anterior chest wall inferior to the Port-A-Cath and medial to the right axilla corresponds to normal subcutaneous tissue on ultrasound. There is no mass or postoperative fluid collection in this region. 2. Crescentic simple postoperative seroma in the inferior medial right axilla measuring 3.4 x 3.3 x 0.8 cm. RECOMMENDATION: The patient and I discussed the options of ultrasound-guided aspiration of the small seroma versus no intervention. Given that the seroma in the axilla is small, and does not correspond to her primary area of concern, she and I do not feel that aspiration is warranted at this time. She is aware that this can be performed in the future if she feels the seroma enlarges or becomes more bothersome to her. I have discussed the findings and recommendations with the patient. Results were also provided in writing at the conclusion of the visit. If applicable, a reminder letter will be sent to the patient regarding the next appointment. BI-RADS CATEGORY  2: Benign. Electronically Signed   By: Curlene Dolphin M.D.   On: 04/08/2018 09:34    RESEARCH: enrolled in UPBEAT study; not a candidate for BR 003 because of concurrent cancer  ASSESSMENT: 67 y.o. [place] woman status post right breast upper outer quadrant biopsy 01/02/2016 for a clinical T2 N0, stage IIA invasive ductal carcinoma, grade 1, estrogen and progesterone receptor positive, HER-2 not amplified, with an MIB-1 of 10%.  (1). Right lumpectomy and sentinel lymph node sampling 01/22/2016 showed a pT1c pN0, stage IA invasive ductal carcinoma, grade 1, with close but negative margins. Repeat HER-2 testing was again not amplified   (3) Oncotype score of  18 predicts a risk of recurrence outside the breast within the next 10 years of 11% if the patient's only systemic therapy is tamoxifen for 5 years. It also predicts no significant benefit from chemotherapy.     (4) adjuvant radiation3/14/17-4/11/17 Site/dose:   Right breast - 42.72 gy at 2.67 Gy per fraction x 16 fractions followed by an electron boost to the right breast with 10 Gy at 2 Gy per fraction x 5 fractions.    (5)  Anastrozole started 04/21/2016, discontinued after approximately 1 month with significant side effects  (a)  B[facility] density at [facility] 2013 was normal  (b) bone density January 2018 was normal  (c) anastrozole restarted February 2018, held again at the start of chemotherapy February 2019  (6) genetics testing  02/24/2016 through the Breast/Ovarian cancer gene panel offered by [facility] found no deleterious mutations in ATM, BARD1, BRCA1, BRCA2, BRIP1, CDH1, CHEK2, EPCAM, FANCC, MLH1, MSH2, MSH6, NBN, PALB2, PMS2, PTEN, RAD51C, RAD51D, TP53, and XRCC2.   (7) left breast biopsy 12/17/2017 showed a cT2 cN0, stage IIA- IIB invasive ductal carcinoma, grade 2, estrogen receptor weakly positive, progesterone receptor negative, with HER-2 equivocal by FISH, negative by immunohistochemistry  (8) status post bilateral mastectomies and left sentinel lymph node sampling 01/04/2018 showing  (a) on the right, no evidence  of disease  (b) on the left, a pT2 pN0, stage IIB invasive ductal carcinoma, grade 3, triple negative  (9) adjuvant chemotherapy consisting of cyclophosphamide and doxorubicin in dose dense fashion x4 started 02/16/2018, completed 03/30/2018,  followed by weekly paclitaxel /carboplatin x12 starting 04/13/2018    PLAN: Samantha Sullivan is doing moderately well today.  Her labs are stable.  However, she does have a new mild constant peripheral neuropathy that started this morning. I reviewed this with Dr. Jana Hakim.  He would like to hold chemotherapy for this week,  and we will re evaluate next week and see how she is doing.  Samantha Sullivan is very disappointed with this news.  She will return in one week however for re evaluation.  I also placed a nutrition referral.    She knows to call for any other problems that may develop before the next visit.  A total of (20) minutes of face-to-face time was spent with this patient with greater than 50% of that time in counseling and care-coordination.   Wilber Bihari, NP  05/04/18 8:44 AM Medical Oncology and Hematology [facility] [address] [place], [place] [address] Tel. [phone]    Fax. [phone]

## 2018-05-04 NOTE — Telephone Encounter (Signed)
Gave pt avs and calendar with appts per 5/14 los.

## 2018-05-04 NOTE — Patient Instructions (Signed)

## 2018-05-06 ENCOUNTER — Encounter: Payer: PPO | Admitting: *Deleted

## 2018-05-06 ENCOUNTER — Inpatient Hospital Stay: Payer: PPO | Admitting: *Deleted

## 2018-05-06 ENCOUNTER — Ambulatory Visit (HOSPITAL_COMMUNITY)
Admission: RE | Admit: 2018-05-06 | Discharge: 2018-05-06 | Disposition: A | Discharge status: Home / Self Care | Payer: PPO | Source: Ambulatory Visit | Attending: Oncology | Admitting: Oncology

## 2018-05-06 ENCOUNTER — Encounter: Payer: Self-pay | Admitting: *Deleted

## 2018-05-06 DIAGNOSIS — Z170 Estrogen receptor positive status [ER+]: Principal | ICD-10-CM

## 2018-05-06 DIAGNOSIS — C50411 Malignant neoplasm of upper-outer quadrant of right female breast: Secondary | ICD-10-CM

## 2018-05-06 DIAGNOSIS — C50919 Malignant neoplasm of unspecified site of unspecified female breast: Secondary | ICD-10-CM

## 2018-05-06 NOTE — Progress Notes (Signed)
WF [id number]-UPBEAT 3 MONTH STUDY ASSESSMENT: 05/06/2018 @ 1215: Patient arrived to clinic unaccompanied to complete her 3 month study activities. The research nurse escorted her to testing area an neurocognitive testing was performed by [name], Research Specialist and questionnaires were given to patient to complete. Research nurse reviewed questionnaires for completeness-required patient to initial some strike-outs she had marked when changing her mind to an answer. Depression score was "3" and requires no reporting to provider. Physical Functions Measures were completed which included: 6 Minute Walk; Disabilities Measures including Shoulder Flexion and Abduction and Grip Strength; Expanded Short Physical Performance Battery (SPPB) which included Balance Tests, Gait Speed Test, Narrow Walk Test, and Chair Stand Test. Total SPPB Score=11. Concomitant medications reviewed, which revealed no change in medications since baseline assessment. She has not required the topical for her psoriasis or the tramadol of late. Her main symptom complaints are of excess tearing in her eyes and decrease in visual acuity, and peripheral neuropathy. MD is aware, and chemo was held this week due to the neuropathy. No AEs related to participation in [place]. Patient inquired into results of study labs drawn on 05/04/18 and research nurse reviewed these with patient. She requests a copy of the results-this will be communicated to her research nurse. Cardiac MRI is being done today at 3:00 pm-inquired if she wants to return to cancer center for her gift card or can her research nurse give it to her on 05/11/18 when she is here for her next treatment. Patient would like to receive gift card on 05/11/18. Confirmed she remembers how to get to MRI department. Encouraged her to call with any questions and research nurse thanked her for her continued support of the study by her participation. Mauri Reading Michaelle Copas Therapist, sports, BSN Clinical Research  Nurse 05/06/2018 @ [phone]

## 2018-05-11 ENCOUNTER — Encounter: Payer: Self-pay | Admitting: Adult Health

## 2018-05-11 ENCOUNTER — Inpatient Hospital Stay (HOSPITAL_BASED_OUTPATIENT_CLINIC_OR_DEPARTMENT_OTHER): Payer: PPO | Admitting: Adult Health

## 2018-05-11 ENCOUNTER — Inpatient Hospital Stay: Payer: PPO

## 2018-05-11 ENCOUNTER — Encounter: Payer: Self-pay | Admitting: *Deleted

## 2018-05-11 ENCOUNTER — Telehealth: Payer: Self-pay | Admitting: *Deleted

## 2018-05-11 ENCOUNTER — Telehealth: Payer: Self-pay | Admitting: Oncology

## 2018-05-11 VITALS — BP 144/88 | HR 84 | Temp 98.0°F | Resp 18 | Ht 65.0 in | Wt 166.4 lb

## 2018-05-11 DIAGNOSIS — Z9013 Acquired absence of bilateral breasts and nipples: Secondary | ICD-10-CM | POA: Diagnosis not present

## 2018-05-11 DIAGNOSIS — Z170 Estrogen receptor positive status [ER+]: Secondary | ICD-10-CM | POA: Diagnosis not present

## 2018-05-11 DIAGNOSIS — C50411 Malignant neoplasm of upper-outer quadrant of right female breast: Secondary | ICD-10-CM

## 2018-05-11 DIAGNOSIS — Z5111 Encounter for antineoplastic chemotherapy: Secondary | ICD-10-CM | POA: Diagnosis not present

## 2018-05-11 DIAGNOSIS — Z8041 Family history of malignant neoplasm of ovary: Secondary | ICD-10-CM | POA: Diagnosis not present

## 2018-05-11 DIAGNOSIS — Z806 Family history of leukemia: Secondary | ICD-10-CM | POA: Diagnosis not present

## 2018-05-11 DIAGNOSIS — C50912 Malignant neoplasm of unspecified site of left female breast: Secondary | ICD-10-CM | POA: Diagnosis not present

## 2018-05-11 DIAGNOSIS — G620 Drug-induced polyneuropathy: Secondary | ICD-10-CM | POA: Diagnosis not present

## 2018-05-11 DIAGNOSIS — Z171 Estrogen receptor negative status [ER-]: Secondary | ICD-10-CM

## 2018-05-11 DIAGNOSIS — C50812 Malignant neoplasm of overlapping sites of left female breast: Secondary | ICD-10-CM

## 2018-05-11 DIAGNOSIS — T451X5A Adverse effect of antineoplastic and immunosuppressive drugs, initial encounter: Secondary | ICD-10-CM | POA: Diagnosis not present

## 2018-05-11 LAB — CMP (CANCER CENTER ONLY)
ALK PHOS: 126 U/L (ref 40–150)
ALT: 15 U/L (ref 0–55)
ANION GAP: 7 (ref 3–11)
AST: 19 U/L (ref 5–34)
Albumin: 3.3 g/dL — ABNORMAL LOW (ref 3.5–5.0)
BILIRUBIN TOTAL: 0.4 mg/dL (ref 0.2–1.2)
BUN: 9 mg/dL (ref 7–26)
CALCIUM: 9.1 mg/dL (ref 8.4–10.4)
CO2: 24 mmol/L (ref 22–29)
CREATININE: 0.64 mg/dL (ref 0.60–1.10)
Chloride: 108 mmol/L (ref 98–109)
GFR, Estimated: >60 mL/min (ref >60)
Glucose, Bld: 86 mg/dL (ref 70–140)
Potassium: 4.1 mmol/L (ref 3.5–5.1)
Sodium: 139 mmol/L (ref 136–145)
TOTAL PROTEIN: 5.6 g/dL — AB (ref 6.4–8.3)

## 2018-05-11 LAB — CBC WITH DIFFERENTIAL (CANCER CENTER ONLY)
BASOS ABS: 0 10*3/uL (ref 0.0–0.1)
Basophils Relative: 2 %
EOS ABS: 0.1 10*3/uL (ref 0.0–0.5)
Eosinophils Relative: 2 %
HCT: 30.2 % — ABNORMAL LOW (ref 34.8–46.6)
HEMOGLOBIN: 9.7 g/dL — AB (ref 11.6–15.9)
Lymphocytes Relative: 31 %
Lymphs Abs: 0.8 10*3/uL — ABNORMAL LOW (ref 0.9–3.3)
MCH: 31 pg (ref 25.1–34.0)
MCHC: 32.1 g/dL (ref 31.5–36.0)
MCV: 96.5 fL (ref 79.5–101.0)
MONO ABS: 0.4 10*3/uL (ref 0.1–0.9)
MONOS PCT: 16 %
NEUTROS PCT: 49 %
Neutro Abs: 1.2 10*3/uL — ABNORMAL LOW (ref 1.5–6.5)
Platelet Count: 181 10*3/uL (ref 145–400)
RBC: 3.13 MIL/uL — ABNORMAL LOW (ref 3.70–5.45)
RDW: 18.4 % — AB (ref 11.2–14.5)
WBC Count: 2.5 10*3/uL — ABNORMAL LOW (ref 3.9–10.3)

## 2018-05-11 MED ORDER — FAMOTIDINE IN NACL 20-0.9 MG/50ML-% IV SOLN
INTRAVENOUS | Status: AC
  Filled 2018-05-11: qty 50

## 2018-05-11 MED ORDER — HEPARIN SOD (PORK) LOCK FLUSH 100 UNIT/ML IV SOLN
500.0000 [IU] | Freq: Once | INTRAVENOUS | Status: AC | PRN
  Administered 2018-05-11: 500 [IU]
  Filled 2018-05-11: qty 5

## 2018-05-11 MED ORDER — DEXAMETHASONE SODIUM PHOSPHATE 10 MG/ML IJ SOLN
INTRAMUSCULAR | Status: AC
  Filled 2018-05-11: qty 1

## 2018-05-11 MED ORDER — DEXAMETHASONE SODIUM PHOSPHATE 10 MG/ML IJ SOLN
4.0000 mg | Freq: Once | INTRAMUSCULAR | Status: AC
  Administered 2018-05-11: 4 mg via INTRAVENOUS

## 2018-05-11 MED ORDER — DIPHENHYDRAMINE HCL 50 MG/ML IJ SOLN
INTRAMUSCULAR | Status: AC
  Filled 2018-05-11: qty 1

## 2018-05-11 MED ORDER — FAMOTIDINE IN NACL 20-0.9 MG/50ML-% IV SOLN: 20.0000 mg | Freq: Once | INTRAVENOUS | Status: DC

## 2018-05-11 MED ORDER — GEMCITABINE HCL CHEMO INJECTION 1 GM/26.3ML
800.0000 mg/m2 | Freq: Once | INTRAVENOUS | Status: AC
  Administered 2018-05-11: 1558 mg via INTRAVENOUS
  Filled 2018-05-11: qty 40.98

## 2018-05-11 MED ORDER — SODIUM CHLORIDE 0.9 % IV SOLN
Freq: Once | INTRAVENOUS | Status: AC
  Administered 2018-05-11: 10:00:00 via INTRAVENOUS

## 2018-05-11 MED ORDER — PALONOSETRON HCL INJECTION 0.25 MG/5ML
0.2500 mg | Freq: Once | INTRAVENOUS | Status: AC
  Administered 2018-05-11: 0.25 mg via INTRAVENOUS

## 2018-05-11 MED ORDER — DIPHENHYDRAMINE HCL 50 MG/ML IJ SOLN: 25.0000 mg | Freq: Once | INTRAMUSCULAR | Status: DC

## 2018-05-11 MED ORDER — PALONOSETRON HCL INJECTION 0.25 MG/5ML
INTRAVENOUS | Status: AC
  Filled 2018-05-11: qty 5

## 2018-05-11 MED ORDER — SODIUM CHLORIDE 0.9 % IV SOLN
192.2000 mg | Freq: Once | INTRAVENOUS | Status: AC
  Administered 2018-05-11: 190 mg via INTRAVENOUS
  Filled 2018-05-11: qty 19

## 2018-05-11 MED ORDER — SODIUM CHLORIDE 0.9% FLUSH
10.0000 mL | INTRAVENOUS | Status: DC | PRN
  Administered 2018-05-11: 10 mL
  Filled 2018-05-11: qty 10

## 2018-05-11 NOTE — Telephone Encounter (Signed)
Per notification of [place] of 1.2 - OK to treat per MD.  Above called to treatment room nurse.

## 2018-05-11 NOTE — Progress Notes (Addendum)
[place]  [phone] [phone]     ID: [name] DOB: 1951/01/18  MR#: [id number]  KDX#:[id number]  Patient Care Team: Shon Baton, MD as PCP - General (Internal Medicine) Magrinat, Virgie Dad, MD as Consulting Physician (Oncology) Kem Boroughs, [place] as Nurse Practitioner (Family Medicine) Regal, Tamala Fothergill, DPM as Consulting Physician (Podiatry) Excell Seltzer, MD as Consulting Physician (General Surgery) Force, Shari Prows, DO as Referring Physician (Student) PCP: Shon Baton, MD OTHER MD:  CHIEF COMPLAINT: Contralateral breast cancer, triple negative  CURRENT TREATMENT: Adjuvant chemotherapy  INTERVAL HISTORY: Samantha Sullivan returns today for follow-up of her triple negative breast cancer. She completed treatment with cycle 4 of 4 planned cycles of cyclophosphamide and doxorubicin on 03/30/2018 is now here to receive 12 planned doses of carboplatin and paclitaxel, with dose #4 due today.  We held her treatment last week due to mild peripheral neuropathy in her fingertips.  She tells me that after we held the chemotherapy, her neuropathy got slightly worse, but now it is improved, however it is still constant in her tips.  She continues to have nail dyscrasia, the fingernails are painful and discolored.    REVIEW OF SYSTEMS: Samantha Sullivan is doing well today.  She has constipation that she is managing and she says is under control.  Otherwise, she is doing well and a 10 point ROS is otherwise non contributory.    RIGHT BREAST CANCER HISTORY:  From the original intake note:  "[name]"noted a lump in her right breast sometime in late October or early November, but there were "so many things going on in her life" she did not bring it to medical attention until she saw [name] 12/11/2015. [name] was able to palpate a mass at the 9:30 o'clock position in the right breast, which was mobile and nontender. The left breast is status post prior remote biopsy  and there was some scar tissue at the 7:00 position. The patient was set up for bilateral diagnostic Mammography with tomosynthesis at [facility] 12/27/2015. This found the breast density to be category C. There was a new oval mass in the right breast upper outer quadrant. Ultrasound the same day confirmed a 2.2 cm mass with irregular margins in the right breast at the 9:00 position read as highly suggestive of malignancy.  Biopsy of the right breast mass in question 01/02/2016 found ([id number]) an invasive ductal carcinoma, grade 1, estrogen receptor 100% positive, progesterone receptor 90% positive, both with strong staining intensity, with an MIB-1 of 10%, and no HER-2 amplification, the signals ratio being 1.32 and the number per cell 2.05. The patient was informed and instructed to stop hormone replacement therapy (last dose 01/04/2016).  Her subsequent history is as detailed below.    PAST MEDICAL HISTORY: Past Medical History:  Diagnosis Date  . Abnormal CT of the chest 12/06   numeroous lung nodules - most have resolved.  . Arthritis    psoriatic arthritis- toes & feet, but resolved at this point- no need meds at this point    . Cancer [facility])    right breast cancer  . Complication of anesthesia    /w colonoscopy- woke up before procedure was complete  . Family history of adverse reaction to anesthesia    pt's mother reported difficulty being given " enough " medicine to outcome a successful anesth. experience  . Family history of leukemia   . Family history of ovarian cancer   . History of meningioma 2007   in  February 29, 2012 stable on MRI and recheck in 5 years, no surgery necessry   . HSV infection 1970's- 1980's  . Psoriatic arthritis ([place])     PAST SURGICAL HISTORY: Past Surgical History:  Procedure Laterality Date  . BILATERAL TOTAL MASTECTOMY WITH AXILLARY LYMPH NODE DISSECTION Bilateral 01/04/2018   Procedure: BILATERAL TOTAL MASTECTOMY PROPHYLATIC RIGHT  WITH LEFT SENTINEAL  LYMPH  NODE;  Surgeon: Excell Seltzer, MD;  Location: [facility];  Service: General;  Laterality: Bilateral;  . BREAST LUMPECTOMY WITH RADIOACTIVE SEED AND SENTINEL LYMPH NODE BIOPSY Right 01/22/2016   Procedure: RIGHT BREAST LUMPECTOMY WITH RADIOACTIVE SEED AND SENTINEL LYMPH NODE BIOPSY;  Surgeon: Excell Seltzer, MD;  Location: [facility];  Service: General;  Laterality: Right;  . BREAST SURGERY Left 1971, Mar 01, 2015   lumpectomy benign, 01-Mar-2015- Br. Ca- R lumpectomy & radiation   . COLONOSCOPY  8/06   normal and recheck in 10 years  . HAMMER TOE SURGERY Left 03-01-2015  . MASTECTOMY Right 01/04/2018   PROPHYLATIC   . MASTECTOMY COMPLETE / SIMPLE W/ SENTINEL NODE BIOPSY Left 01/04/2018  . PORTACATH PLACEMENT Right 01/04/2018   Procedure: INSERTION PORT-A-CATH;  Surgeon: Excell Seltzer, MD;  Location: [facility];  Service: General;  Laterality: Right;  . THYROIDECTOMY, PARTIAL Left 1983    FAMILY HISTORY Family History  Problem Relation Age of Onset  . Osteoporosis Mother   . Osteoporosis Sister   . Lung cancer Maternal Aunt        smoker  . Heart Problems Maternal Grandmother   . COPD Maternal Grandfather   . Neurodegenerative disease Maternal Aunt        corical-ganglion degeneration  . Ovarian cancer Maternal Aunt        dx in her 86s  . Leukemia Cousin        63s-40s; maternal cousin  [name] has essentially no information on her father's side of the family aside from the fact that he died in his 26s from heart disease. He had psoriasis. The patient's mother passed away 02/28/2017 age 19, due to breast cancer. The patient had 2 brothers who died from congenital heart disease shortly after birth. The patient has one sister. The patient's mother had one sister, the patient's aunt, diagnosed with ovarian cancer in her 69s.  GYNECOLOGIC HISTORY:  Patient's last menstrual period was 03/22/2000 (approximate). Menarche age 50, first live birth age 31. The patient is GX P2. She went through  menopause approximately 02/28/89 but was taking hormone replacement until 01/04/2016. Since coming off replacement she has had insomnia but no hot flashes or vaginal dryness pleural bones so 4. 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 She is divorced and lives alone, with no pets. Her daughter [name] lives in [place]. She is disabled secondary to schizoaffective disorder. Daughter [name] lives in [place] and she is a Estate agent but mostly a homemaker. The patient has 3 grandchildren, the older 74, the other 2 being under 3 years old. The patient attends [employer]. Her mother passed away in February 28, 2017 due to breast cancer.    ADVANCED DIRECTIVES: her daughter [name] is her healthcare power of attorney. She can be reached at [place]: Social History   Tobacco Use  . Smoking status: Former Smoker    Packs/day: 1.00    Years: 12.00  Pack years: 12.00    Types: Cigarettes    Last attempt to quit: 07/23/1981    Years since quitting: 36.8  . Smokeless tobacco: Never Used  Substance Use Topics  . Alcohol use: Yes    Alcohol/week: 0.0 oz    Comment: social occasion only- special   . Drug use: No     Colonoscopy: ; [facility]  EHM:CNOBSJG 2016/[facility]  Bone density:rJanuary 2018/normal   Lipid panel:  Allergies  Allergen Reactions  . Bee Venom   . Ciprofloxacin   . Levaquin [Levofloxacin In D5w]   . Norco [Hydrocodone-Acetaminophen] Other (See Comments)    Pt states Norco made her extremely hyper after her surgery and she was not able to sleep.    Current Outpatient Medications  Medication Sig Dispense Refill  . folic acid (FOLVITE) 1 MG tablet Take 2 mg by mouth every evening.    . lidocaine-prilocaine (EMLA) cream Apply to affected area once 30 g 3  . LORazepam  (ATIVAN) 0.5 MG tablet Take 1 tablet (0.5 mg total) by mouth at bedtime as needed and may repeat dose one time if needed (Nausea or vomiting). 10 tablet 0  . Polyethyl Glycol-Propyl Glycol (LUBRICANT EYE DROPS) 0.4-0.3 % SOLN Place 1-2 drops into both eyes 3 (three) times daily as needed (dry eyes/irritated eyes.).    Marland Kitchen prochlorperazine (COMPAZINE) 10 MG tablet Take 1 tablet (10 mg total) by mouth every 6 (six) hours as needed (Nausea or vomiting). 30 tablet 1   Current Facility-Administered Medications  Medication Dose Route Frequency Provider Last Rate Last Dose  . triamcinolone acetonide (KENALOG) 10 MG/ML injection 10 mg  10 mg Other Once Wallene Huh, DPM        OBJECTIVE:  Vitals:   05/11/18 0835  BP: (!) 144/88  Sullivan: 84  Resp: 18  Temp: 98 F (36.7 C)  SpO2: 99%     Body mass index is 27.69 kg/m.   ECOG FS:1 - Symptomatic but completely ambulatory GENERAL: Patient is a well appearing female in no acute distress HEENT:  Sclerae anicteric.  Oropharynx clear and moist. No ulcerations or evidence of oropharyngeal candidiasis. Neck is supple.  NODES:  No cervical, supraclavicular, or axillary lymphadenopathy palpated.  BREAST EXAM:  Deferred. LUNGS:  Clear to auscultation bilaterally.  No wheezes or rhonchi. HEART:  Regular rate and rhythm. No murmur appreciated. ABDOMEN:  Soft, nontender.  Positive, normoactive bowel sounds. No organomegaly palpated. MSK:  No focal spinal tenderness to palpation. Full range of motion bilaterally in the upper extremities. EXTREMITIES:  No peripheral edema.   SKIN:  Clear with no obvious rashes or skin changes. + color change to the fingernail in a majority of her fingernails.   NEURO:  Nonfocal. Well oriented.  Appropriate affect.     LAB RESULTS:  CMP     Component Value Date/Time   NA 139 05/11/2018 0815   NA 141 08/05/2017 1335   K 4.1 05/11/2018 0815   K 4.5 08/05/2017 1335   CL 108 05/11/2018 0815   CO2 24 05/11/2018 0815    CO2 29 08/05/2017 1335   GLUCOSE 86 05/11/2018 0815   GLUCOSE 77 08/05/2017 1335   BUN 9 05/11/2018 0815   BUN 13.4 08/05/2017 1335   CREATININE 0.64 05/11/2018 0815   CREATININE 0.8 08/05/2017 1335   CALCIUM 9.1 05/11/2018 0815   CALCIUM 10.0 08/05/2017 1335   PROT 5.6 (L) 05/11/2018 0815   PROT 6.8 08/05/2017 1335   ALBUMIN 3.3 (L) 05/11/2018 0815  ALBUMIN 3.6 08/05/2017 1335   AST 19 05/11/2018 0815   AST 19 08/05/2017 1335   ALT 15 05/11/2018 0815   ALT 12 08/05/2017 1335   ALKPHOS 126 05/11/2018 0815   ALKPHOS 169 (H) 08/05/2017 1335   BILITOT 0.4 05/11/2018 0815   BILITOT 0.63 08/05/2017 1335   GFRNONAA >60 05/11/2018 0815   GFRAA >60 05/11/2018 0815    INo results found for: SPEP, UPEP  Lab Results  Component Value Date   WBC 2.5 (L) 05/11/2018   NEUTROABS 1.2 (L) 05/11/2018   HGB 9.7 (L) 05/11/2018   HCT 30.2 (L) 05/11/2018   MCV 96.5 05/11/2018   PLT 181 05/11/2018      Chemistry      Component Value Date/Time   NA 139 05/11/2018 0815   NA 141 08/05/2017 1335   K 4.1 05/11/2018 0815   K 4.5 08/05/2017 1335   CL 108 05/11/2018 0815   CO2 24 05/11/2018 0815   CO2 29 08/05/2017 1335   BUN 9 05/11/2018 0815   BUN 13.4 08/05/2017 1335   CREATININE 0.64 05/11/2018 0815   CREATININE 0.8 08/05/2017 1335      Component Value Date/Time   CALCIUM 9.1 05/11/2018 0815   CALCIUM 10.0 08/05/2017 1335   ALKPHOS 126 05/11/2018 0815   ALKPHOS 169 (H) 08/05/2017 1335   AST 19 05/11/2018 0815   AST 19 08/05/2017 1335   ALT 15 05/11/2018 0815   ALT 12 08/05/2017 1335   BILITOT 0.4 05/11/2018 0815   BILITOT 0.63 08/05/2017 1335       No results found for: LABCA2  No components found for: LABCA125  No results for input(s): INR in the last 168 hours.  Urinalysis    Component Value Date/Time   BILIRUBINUR neg 12/11/2015 0925   PROTEINUR neg 12/11/2015 0925   UROBILINOGEN negative 12/11/2015 0925   NITRITE neg 12/11/2015 0925   LEUKOCYTESUR Negative  12/11/2015 0925    STUDIES: No results found.  RESEARCH: enrolled in UPBEAT study; not a candidate for BR 003 because of concurrent cancer  ASSESSMENT: 67 y.o. [place] woman status post right breast upper outer quadrant biopsy 01/02/2016 for a clinical T2 N0, stage IIA invasive ductal carcinoma, grade 1, estrogen and progesterone receptor positive, HER-2 not amplified, with an MIB-1 of 10%.  (1). Right lumpectomy and sentinel lymph node sampling 01/22/2016 showed a pT1c pN0, stage IA invasive ductal carcinoma, grade 1, with close but negative margins. Repeat HER-2 testing was again not amplified   (3) Oncotype score of 18 predicts a risk of recurrence outside the breast within the next 10 years of 11% if the patient's only systemic therapy is tamoxifen for 5 years. It also predicts no significant benefit from chemotherapy.     (4) adjuvant radiation3/14/17-4/11/17 Site/dose:   Right breast - 42.72 gy at 2.67 Gy per fraction x 16 fractions followed by an electron boost to the right breast with 10 Gy at 2 Gy per fraction x 5 fractions.    (5)  Anastrozole started 04/21/2016, discontinued after approximately 1 month with significant side effects  (a)  Bone density at [facility] September 2013 was normal  (b) bone density January 2018 was normal  (c) anastrozole restarted February 2018, held again at the start of chemotherapy February 2019  (6) genetics testing  02/24/2016 through the Breast/Ovarian cancer gene panel offered by [facility] found no deleterious mutations in ATM, BARD1, BRCA1, BRCA2, BRIP1, CDH1, CHEK2, EPCAM, FANCC, MLH1, MSH2, MSH6, NBN, PALB2, PMS2, PTEN, RAD51C,  RAD51D, TP53, and XRCC2.   (7) left breast biopsy 12/17/2017 showed a cT2 cN0, stage IIA- IIB invasive ductal carcinoma, grade 2, estrogen receptor weakly positive, progesterone receptor negative, with HER-2 equivocal by FISH, negative by immunohistochemistry  (8) status post bilateral mastectomies and left  sentinel lymph node sampling 01/04/2018 showing  (a) on the right, no evidence of disease  (b) on the left, a pT2 pN0, stage IIB invasive ductal carcinoma, grade 3, triple negative  (9) adjuvant chemotherapy consisting of cyclophosphamide and doxorubicin in dose dense fashion x4 started 02/16/2018, completed 03/30/2018,  followed by weekly paclitaxel /carboplatin x12 starting 04/13/2018; Paclitaxel/Carboplatin stopped after 3 cycles due to peripheral neuropathy.  She will start Gemcitabine/Carboplatin given on days 1 and 8 on a 21 day cycle starting on 05/11/18.   PLAN:  Samantha Sullivan is doing moderately well.  She unfortunately continues to have peripheral neuropathy.  This was reviewed with Dr. Jana Hakim.  She will no longer receive the Paclitaxel chemotherapy.  She will be changed to Gemcitabine/Carboplatin given on days 1 and 8 of a 21 day cycle.  Dr. Jana Hakim did come into the appointment and review this with her in detail, along with risks/benefits of treatment.  She is in agreement.  I spoke with [facility] in prior authorizations, and the patient is cleared to proceed with treatment today from an insurance standpoint.    Samantha Sullivan will return in 1 week for day 8 Gemcitabine.  She knows to call for any other problems that may develop before the next visit.   Wilber Bihari, NP  05/11/18 9:07 AM Medical Oncology and Hematology [facility] [address] [place], [place] [address] Tel. [phone]    Fax. [phone]   ADDENDUM: Samantha Sullivan has had significant damage to the base of her nails and to the nails themselves secondary to the Taxol.  This has caused some discomfort at the fingertips.  However she understands that this is not neuropathy and it was not until she started having numbness in her finger pads last week that we became concerned.  We held her treatment last week.  The neuropathy in her fingertips actually became worse.  Accordingly we have to face whether we want to stop her  treatment at this point or change her treatment.  Because her tumor is triple negative and we are really working hard for a cure, I would prefer to switch to carboplatin and gemcitabine.  This should not affect the peripheral nerve nerves.  This combination however is more immunosuppressive and she may need some breaks if she is to complete a total of 12 treatments including the carbo/Taxol treatments she already had  We did discuss the possible toxicities complications and side effects of this treatment and she is agreeable in fact eager to proceed.  I personally saw this patient and performed a substantive portion of this encounter with the listed APP documented above.   Chauncey Cruel, MD Medical Oncology and Hematology [facility] [address] [place], [place] [address] Tel. [phone]    Fax. [phone]

## 2018-05-11 NOTE — Patient Instructions (Signed)
[facility] Discharge Instructions for Patients Receiving Chemotherapy  Today you received the following chemotherapy agents Gemzar and Carboplatin  To help prevent nausea and vomiting after your treatment, we encourage you to take your nausea medication as directed   If you develop nausea and vomiting that is not controlled by your nausea medication, call the clinic.   BELOW ARE SYMPTOMS THAT SHOULD BE REPORTED IMMEDIATELY:  *FEVER GREATER THAN 100.5 F  *CHILLS WITH OR WITHOUT FEVER  NAUSEA AND VOMITING THAT IS NOT CONTROLLED WITH YOUR NAUSEA MEDICATION  *UNUSUAL SHORTNESS OF BREATH  *UNUSUAL BRUISING OR BLEEDING  TENDERNESS IN MOUTH AND THROAT WITH OR WITHOUT PRESENCE OF ULCERS  *URINARY PROBLEMS  *BOWEL PROBLEMS  UNUSUAL RASH Items with * indicate a potential emergency and should be followed up as soon as possible.  Feel free to call the clinic should you have any questions or concerns. The clinic phone number is [phone].  Please show the [facility] at check-in to the Emergency Department and triage nurse.   Gemcitabine (GEMZAR) injection What is this medicine? GEMCITABINE (jem SIT a been) is a chemotherapy drug. This medicine is used to treat many types of cancer like breast cancer, lung cancer, pancreatic cancer, and ovarian cancer. This medicine may be used for other purposes; ask your health care provider or pharmacist if you have questions. COMMON BRAND NAME(S): Gemzar What should I tell my health care provider before I take this medicine? They need to know if you have any of these conditions: -blood disorders -infection -kidney disease -liver disease -recent or ongoing radiation therapy -an unusual or allergic reaction to gemcitabine, other chemotherapy, other medicines, foods, dyes, or preservatives -pregnant or trying to get pregnant -breast-feeding How should I use this medicine? This drug is given as an infusion into a vein.  It is administered in a hospital or clinic by a specially trained health care professional. Talk to your pediatrician regarding the use of this medicine in children. Special care may be needed. Overdosage: If you think you have taken too much of this medicine contact a poison control center or emergency room at once. NOTE: This medicine is only for you. Do not share this medicine with others. What if I miss a dose? It is important not to miss your dose. Call your doctor or health care professional if you are unable to keep an appointment. What may interact with this medicine? -medicines to increase blood counts like filgrastim, pegfilgrastim, sargramostim -some other chemotherapy drugs like cisplatin -vaccines Talk to your doctor or health care professional before taking any of these medicines: -acetaminophen -aspirin -ibuprofen -ketoprofen -naproxen This list may not describe all possible interactions. Give your health care provider a list of all the medicines, herbs, non-prescription drugs, or dietary supplements you use. Also tell them if you smoke, drink alcohol, or use illegal drugs. Some items may interact with your medicine. What should I watch for while using this medicine? Visit your doctor for checks on your progress. This drug may make you feel generally unwell. This is not uncommon, as chemotherapy can affect healthy cells as well as cancer cells. Report any side effects. Continue your course of treatment even though you feel ill unless your doctor tells you to stop. In some cases, you may be given additional medicines to help with side effects. Follow all directions for their use. Call your doctor or health care professional for advice if you get a fever, chills or sore throat, or other symptoms  of a cold or flu. Do not treat yourself. This drug decreases your body's ability to fight infections. Try to avoid being around people who are sick. This medicine may increase your risk to  bruise or bleed. Call your doctor or health care professional if you notice any unusual bleeding. Be careful brushing and flossing your teeth or using a toothpick because you may get an infection or bleed more easily. If you have any dental work done, tell your dentist you are receiving this medicine. Avoid taking products that contain aspirin, acetaminophen, ibuprofen, naproxen, or ketoprofen unless instructed by your doctor. These medicines may hide a fever. Women should inform their doctor if they wish to become pregnant or think they might be pregnant. There is a potential for serious side effects to an unborn child. Talk to your health care professional or pharmacist for more information. Do not breast-feed an infant while taking this medicine. What side effects may I notice from receiving this medicine? Side effects that you should report to your doctor or health care professional as soon as possible: -allergic reactions like skin rash, itching or hives, swelling of the face, lips, or tongue -low blood counts - this medicine may decrease the number of white blood cells, red blood cells and platelets. You may be at increased risk for infections and bleeding. -signs of infection - fever or chills, cough, sore throat, pain or difficulty passing urine -signs of decreased platelets or bleeding - bruising, pinpoint red spots on the skin, black, tarry stools, blood in the urine -signs of decreased red blood cells - unusually weak or tired, fainting spells, lightheadedness -breathing problems -chest pain -mouth sores -nausea and vomiting -pain, swelling, redness at site where injected -pain, tingling, numbness in the hands or feet -stomach pain -swelling of ankles, feet, hands -unusual bleeding Side effects that usually do not require medical attention (report to your doctor or health care professional if they continue or are bothersome): -constipation -diarrhea -hair loss -loss of  appetite -stomach upset This list may not describe all possible side effects. Call your doctor for medical advice about side effects. You may report side effects to FDA at 1-800-FDA-1088. Where should I keep my medicine? This drug is given in a hospital or clinic and will not be stored at home. NOTE: This sheet is a summary. It may not cover all possible information. If you have questions about this medicine, talk to your doctor, pharmacist, or health care provider.  2018 Elsevier/Gold Standard (2008-04-18 18:45:54)

## 2018-05-11 NOTE — Telephone Encounter (Signed)
Spoke to patient regarding upcoming appointments per 5/21 los.

## 2018-05-11 NOTE — Progress Notes (Signed)
Nutrition Assessment   Reason for Assessment:   Referral from [place] regarding poor appetite  ASSESSMENT:  67 year old female with triple positive breast cancer.  Patient s/p bilateral mastectomies.    Met with patient during infusion.  Patient reports that appetite has improved as her treatment was held.  Reports that foods didn't set well but reports that she was not nauseated.  Reports tried to eat simple foods like apples and peanut butter, eggs. Reports that she is now eating 3 meals per day and doing better.    Nutrition Focused Physical Exam: deferred  Medications: folic acid, ativan, compazine  Labs: reviewed  Anthropometrics:   Height: 65 inches Weight: 166 lb 6.4 oz today (weight increased) UBW: 188 lb Jan 2019 BMI: 27  12% weight loss in the last 4 months, significant   Estimated Energy Needs  Kcals: 1700-1850 calories/d Protein: 85-114 g/d Fluid: 1.8 L/d  NUTRITION DIAGNOSIS: Inadequate oral intake related to chemotherapy side effects (poor appetite, no taste) as evidenced by 12% weight loss and decreased intake   INTERVENTION:   Discussed importance of good nutrition during treatment.  Encouraged small frequent meals Discussed good sources of protein and encouraged at every meal.  Contact information given.    MONITORING, EVALUATION, GOAL: Patient will consume adequate calories and protein to meet nutritional needs during treatment   NEXT VISIT: as needed  Cormick Moss B. Zenia Resides, [place], [place] Registered Dietitian [phone] (pager)

## 2018-05-11 NOTE — Progress Notes (Signed)
[id number] (UPBEAT):  This RN met with patient Samantha Sullivan in the treatment room today.  The patient had previously completed all required procedures for her Month 3 visit.  The patient denies having any questions about the study. The patient is in good spirits. This RN provided the patient with a copy of her study lab results from [facility], and informed the patient that Dr. Jana Hakim had also reviewed the results.  This RN also provided the patient with her gift card for the completion of the Month 3 visit.  The patient is looking forward to an afternoon spent with her daughter and grandchildren who are on their way into town to visit her today.   This RN informed the patient that her next study visit would be in several months, and that a member of the research team would be in touch with her to schedule closer to the date of the study visit.  This RN thanked the patient for her time and ongoing willingness to participate in research. Doreatha Martin, RN, BSN, [facility] 05/11/2018 1:44 PM

## 2018-05-12 ENCOUNTER — Telehealth: Payer: Self-pay | Admitting: *Deleted

## 2018-05-12 NOTE — Telephone Encounter (Signed)
-----   Message from Murvin Natal, RN sent at 05/11/2018 11:52 AM EDT ----- Regarding: pt of dr Jana Hakim first time chemo follow up call Pt of dr. Jana Hakim first time Gemzar follow up call.  Pt tolerated treatment well.

## 2018-05-12 NOTE — Telephone Encounter (Signed)
This RN attempted to contact pt and obtained identified VM - message left stating call for followup post new drug administration yesterday with request to return call if needed.

## 2018-05-17 NOTE — Progress Notes (Signed)
[name]  [phone] [phone]     ID: Samantha Sullivan DOB: Oct 03, 1951  MR#: [id number]  JJO#:[id number]  Patient Care Team: Shon Baton, MD as PCP - General (Internal Medicine) Magrinat, Virgie Dad, MD as Consulting Physician (Oncology) Kem Boroughs, [place] as Nurse Practitioner (Family Medicine) Regal, Tamala Fothergill, DPM as Consulting Physician (Podiatry) Excell Seltzer, MD as Consulting Physician (General Surgery) Force, Shari Prows, DO as Referring Physician (Student) PCP: Shon Baton, MD OTHER MD:  CHIEF COMPLAINT: Contralateral breast cancer, triple negative  CURRENT TREATMENT: Adjuvant chemotherapy  INTERVAL HISTORY: Samantha Sullivan is here today for evaluation prior to receiving her weekly Gemcitabine and Carboplatin for her triple negative breast cancer.  She is receiving this on weekly basis.  Samantha Sullivan tells me that she tolerated the chemotherapy well last week.    REVIEW OF SYSTEMS: Samantha Sullivan is doing well today.  She wants to know my recommendation for a better bowel regimen than what she has been using.  She tells me that her neuropathy and nail pain is much improved as compared to last week.  She is fatigued.  She denies any other issues such as fevers, chills, headaches, vision changes, appetite decrease, chest pain, shortness of breath, cough, palpitations, or any other concerns.  A detailed ROS was otherwise non contributory.    RIGHT BREAST CANCER HISTORY:  From the original intake note:  "[name]"noted a lump in her right breast sometime in late October or early November, but there were "so many things going on in her life" she did not bring it to medical attention until she saw [name] 12/11/2015. Ms Samantha Sullivan was able to palpate a mass at the 9:30 o'clock position in the right breast, which was mobile and nontender. The left breast is status post prior remote biopsy and there was some scar tissue at the 7:00 position. The patient was set up for  bilateral diagnostic Mammography with tomosynthesis at [facility] 12/27/2015. This found the breast density to be category C. There was a new oval mass in the right breast upper outer quadrant. Ultrasound the same day confirmed a 2.2 cm mass with irregular margins in the right breast at the 9:00 position read as highly suggestive of malignancy.  Biopsy of the right breast mass in question 01/02/2016 found ([id number]) an invasive ductal carcinoma, grade 1, estrogen receptor 100% positive, progesterone receptor 90% positive, both with strong staining intensity, with an MIB-1 of 10%, and no HER-2 amplification, the signals ratio being 1.32 and the number per cell 2.05. The patient was informed and instructed to stop hormone replacement therapy (last dose 01/04/2016).  Her subsequent history is as detailed below.    PAST MEDICAL HISTORY: Past Medical History:  Diagnosis Date  . Abnormal CT of the chest 12/06   numeroous lung nodules - most have resolved.  . Arthritis    psoriatic arthritis- toes & feet, but resolved at this point- no need meds at this point    . Cancer [facility])    right breast cancer  . Complication of anesthesia    /w colonoscopy- woke up before procedure was complete  . Family history of adverse reaction to anesthesia    pt's mother reported difficulty being given " enough " medicine to outcome a successful anesth. experience  . Family history of leukemia   . Family history of ovarian cancer   . History of meningioma 2007   in 2013 stable on MRI and recheck in 5 years, no surgery necessry   .  HSV infection 1970's- 1980's  . Psoriatic arthritis ([place])     PAST SURGICAL HISTORY: Past Surgical History:  Procedure Laterality Date  . BILATERAL TOTAL MASTECTOMY WITH AXILLARY LYMPH NODE DISSECTION Bilateral 01/04/2018   Procedure: BILATERAL TOTAL MASTECTOMY PROPHYLATIC RIGHT  WITH LEFT SENTINEAL  LYMPH NODE;  Surgeon: Excell Seltzer, MD;  Location: [facility];  Service: General;   Laterality: Bilateral;  . BREAST LUMPECTOMY WITH RADIOACTIVE SEED AND SENTINEL LYMPH NODE BIOPSY Right 01/22/2016   Procedure: RIGHT BREAST LUMPECTOMY WITH RADIOACTIVE SEED AND SENTINEL LYMPH NODE BIOPSY;  Surgeon: Excell Seltzer, MD;  Location: [place];  Service: General;  Laterality: Right;  . BREAST SURGERY Left 1971, 2016   lumpectomy benign, 2016- Br. Ca- R lumpectomy & radiation   . COLONOSCOPY  8/06   normal and recheck in 10 years  . HAMMER TOE SURGERY Left 2016  . MASTECTOMY Right 01/04/2018   PROPHYLATIC   . MASTECTOMY COMPLETE / SIMPLE W/ SENTINEL NODE BIOPSY Left 01/04/2018  . PORTACATH PLACEMENT Right 01/04/2018   Procedure: INSERTION PORT-A-CATH;  Surgeon: Excell Seltzer, MD;  Location: [facility];  Service: General;  Laterality: Right;  . THYROIDECTOMY, PARTIAL Left 1983    FAMILY HISTORY Family History  Problem Relation Age of Onset  . Osteoporosis Mother   . Osteoporosis Sister   . Lung cancer Maternal Aunt        smoker  . Heart Problems Maternal Grandmother   . COPD Maternal Grandfather   . Neurodegenerative disease Maternal Aunt        corical-ganglion degeneration  . Ovarian cancer Maternal Aunt        dx in her 38s  . Leukemia Cousin        54s-40s; maternal cousin  [name] has essentially no information on her father's side of the family aside from the fact that he died in his 59s from heart disease. He had psoriasis. The patient's mother passed away 03-04-2017 age 25, due to breast cancer. The patient had 2 brothers who died from congenital heart disease shortly after birth. The patient has one sister. The patient's mother had one sister, the patient's aunt, diagnosed with ovarian cancer in her 22s.  GYNECOLOGIC HISTORY:  Patient's last menstrual period was 03/22/2000 (approximate). Menarche age 45, first live birth age 46. The patient is GX P2. She went through menopause approximately 1990 but was taking hormone replacement until 01/04/2016.  Since coming off replacement she has had insomnia but no hot flashes or vaginal dryness pleural bones so 4. 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 She is divorced and lives alone, with no pets. Her daughter [name] lives in [place]. She is disabled secondary to schizoaffective disorder. Daughter [name] lives in [place] and she is a Estate agent but mostly a homemaker. The patient has 3 grandchildren, the older 31, the other 2 being under 59 years old. The patient attends [employer]. Her mother passed away in 03/04/2017 due to breast cancer.    ADVANCED DIRECTIVES: her daughter [name] is her healthcare power of attorney. She can be reached at [place]: Social History   Tobacco Use  . Smoking status: Former Smoker    Packs/day: 1.00    Years: 12.00    Pack years: 12.00    Types: Cigarettes    Last attempt to quit:  07/23/1981    Years since quitting: 36.8  . Smokeless tobacco: Never Used  Substance Use Topics  . Alcohol use: Yes    Alcohol/week: 0.0 oz    Comment: social occasion only- special   . Drug use: No     Colonoscopy: ; [facility]  VEL:FYBOFBP 2016/[facility]  Bone density:rJanuary 2018/normal   Lipid panel:  Allergies  Allergen Reactions  . Bee Venom   . Ciprofloxacin   . Levaquin [Levofloxacin In D5w]   . Norco [Hydrocodone-Acetaminophen] Other (See Comments)    Pt states Norco made her extremely hyper after her surgery and she was not able to sleep.    Current Outpatient Medications  Medication Sig Dispense Refill  . folic acid (FOLVITE) 1 MG tablet Take 2 mg by mouth every evening.    . lidocaine-prilocaine (EMLA) cream Apply to affected area once 30 g 3  . LORazepam (ATIVAN) 0.5 MG tablet Take 1 tablet (0.5 mg total) by mouth at bedtime as needed  and may repeat dose one time if needed (Nausea or vomiting). 10 tablet 0  . Polyethyl Glycol-Propyl Glycol (LUBRICANT EYE DROPS) 0.4-0.3 % SOLN Place 1-2 drops into both eyes 3 (three) times daily as needed (dry eyes/irritated eyes.).    Marland Kitchen prochlorperazine (COMPAZINE) 10 MG tablet Take 1 tablet (10 mg total) by mouth every 6 (six) hours as needed (Nausea or vomiting). 30 tablet 1   Current Facility-Administered Medications  Medication Dose Route Frequency Provider Last Rate Last Dose  . triamcinolone acetonide (KENALOG) 10 MG/ML injection 10 mg  10 mg Other Once Wallene Huh, DPM        OBJECTIVE:  Vitals:   05/18/18 1339 05/18/18 1344  BP: 121/87 (!) 124/93  Sullivan: 78 80  Resp: 18   Temp: 98.4 F (36.9 C)   SpO2: 100%      Body mass index is 27.22 kg/m.   ECOG FS:1 - Symptomatic but completely ambulatory GENERAL: Patient is a well appearing female in no acute distress HEENT:  Sclerae anicteric.  Oropharynx clear and moist. No ulcerations or evidence of oropharyngeal candidiasis. Neck is supple.  NODES:  No cervical, supraclavicular, or axillary lymphadenopathy palpated.  BREAST EXAM:  Deferred. LUNGS:  Clear to auscultation bilaterally.  No wheezes or rhonchi. HEART:  Regular rate and rhythm. No murmur appreciated. ABDOMEN:  Soft, nontender.  Positive, normoactive bowel sounds. No organomegaly palpated. MSK:  No focal spinal tenderness to palpation. Full range of motion bilaterally in the upper extremities. EXTREMITIES:  No peripheral edema.   SKIN:  Clear with no obvious rashes or skin changes. + color change to the fingernail in a majority of her fingernails.   NEURO:  Nonfocal. Well oriented.  Appropriate affect.     LAB RESULTS:  CMP     Component Value Date/Time   NA 139 05/11/2018 0815   NA 141 08/05/2017 1335   K 4.1 05/11/2018 0815   K 4.5 08/05/2017 1335   CL 108 05/11/2018 0815   CO2 24 05/11/2018 0815   CO2 29 08/05/2017 1335   GLUCOSE 86 05/11/2018 0815    GLUCOSE 77 08/05/2017 1335   BUN 9 05/11/2018 0815   BUN 13.4 08/05/2017 1335   CREATININE 0.64 05/11/2018 0815   CREATININE 0.8 08/05/2017 1335   CALCIUM 9.1 05/11/2018 0815   CALCIUM 10.0 08/05/2017 1335   PROT 5.6 (L) 05/11/2018 0815   PROT 6.8 08/05/2017 1335   ALBUMIN 3.3 (L) 05/11/2018 0815   ALBUMIN 3.6 08/05/2017 1335   AST  19 05/11/2018 0815   AST 19 08/05/2017 1335   ALT 15 05/11/2018 0815   ALT 12 08/05/2017 1335   ALKPHOS 126 05/11/2018 0815   ALKPHOS 169 (H) 08/05/2017 1335   BILITOT 0.4 05/11/2018 0815   BILITOT 0.63 08/05/2017 1335   GFRNONAA >60 05/11/2018 0815   GFRAA >60 05/11/2018 0815    INo results found for: SPEP, UPEP  Lab Results  Component Value Date   WBC 1.3 (L) 05/18/2018   NEUTROABS 0.5 (L) 05/18/2018   HGB 8.8 (L) 05/18/2018   HCT 26.4 (L) 05/18/2018   MCV 94.3 05/18/2018   PLT 115 (L) 05/18/2018      Chemistry      Component Value Date/Time   NA 139 05/11/2018 0815   NA 141 08/05/2017 1335   K 4.1 05/11/2018 0815   K 4.5 08/05/2017 1335   CL 108 05/11/2018 0815   CO2 24 05/11/2018 0815   CO2 29 08/05/2017 1335   BUN 9 05/11/2018 0815   BUN 13.4 08/05/2017 1335   CREATININE 0.64 05/11/2018 0815   CREATININE 0.8 08/05/2017 1335      Component Value Date/Time   CALCIUM 9.1 05/11/2018 0815   CALCIUM 10.0 08/05/2017 1335   ALKPHOS 126 05/11/2018 0815   ALKPHOS 169 (H) 08/05/2017 1335   AST 19 05/11/2018 0815   AST 19 08/05/2017 1335   ALT 15 05/11/2018 0815   ALT 12 08/05/2017 1335   BILITOT 0.4 05/11/2018 0815   BILITOT 0.63 08/05/2017 1335       No results found for: LABCA2  No components found for: LABCA125  No results for input(s): INR in the last 168 hours.  Urinalysis    Component Value Date/Time   BILIRUBINUR neg 12/11/2015 0925   PROTEINUR neg 12/11/2015 0925   UROBILINOGEN negative 12/11/2015 0925   NITRITE neg 12/11/2015 0925   LEUKOCYTESUR Negative 12/11/2015 0925    STUDIES: No results  found.  RESEARCH: enrolled in UPBEAT study; not a candidate for BR 003 because of concurrent cancer  ASSESSMENT: 67 y.o. [place] woman status post right breast upper outer quadrant biopsy 01/02/2016 for a clinical T2 N0, stage IIA invasive ductal carcinoma, grade 1, estrogen and progesterone receptor positive, HER-2 not amplified, with an MIB-1 of 10%.  (1). Right lumpectomy and sentinel lymph node sampling 01/22/2016 showed a pT1c pN0, stage IA invasive ductal carcinoma, grade 1, with close but negative margins. Repeat HER-2 testing was again not amplified   (3) Oncotype score of 18 predicts a risk of recurrence outside the breast within the next 10 years of 11% if the patient's only systemic therapy is tamoxifen for 5 years. It also predicts no significant benefit from chemotherapy.     (4) adjuvant radiation3/14/17-4/11/17 Site/dose:   Right breast - 42.72 gy at 2.67 Gy per fraction x 16 fractions followed by an electron boost to the right breast with 10 Gy at 2 Gy per fraction x 5 fractions.    (5)  Anastrozole started 04/21/2016, discontinued after approximately 1 month with significant side effects  (a)  [facility] at [facility] 2013 was normal  (b) bone density January 2018 was normal  (c) anastrozole restarted February 2018, held again at the start of chemotherapy February 2019  (6) genetics testing  02/24/2016 through the Breast/Ovarian cancer gene panel offered by [facility] found no deleterious mutations in ATM, BARD1, BRCA1, BRCA2, BRIP1, CDH1, CHEK2, EPCAM, FANCC, MLH1, MSH2, MSH6, NBN, PALB2, PMS2, PTEN, RAD51C, RAD51D, TP53, and XRCC2.   (  7) left breast biopsy 12/17/2017 showed a cT2 cN0, stage IIA- IIB invasive ductal carcinoma, grade 2, estrogen receptor weakly positive, progesterone receptor negative, with HER-2 equivocal by FISH, negative by immunohistochemistry  (8) status post bilateral mastectomies and left sentinel lymph node sampling 01/04/2018  showing  (a) on the right, no evidence of disease  (b) on the left, a pT2 pN0, stage IIB invasive ductal carcinoma, grade 3, triple negative  (9) adjuvant chemotherapy consisting of cyclophosphamide and doxorubicin in dose dense fashion x4 started 02/16/2018, completed 03/30/2018,  followed by weekly paclitaxel /carboplatin x12 starting 04/13/2018; Paclitaxel/Carboplatin stopped after 3 cycles due to peripheral neuropathy.  She will start Gemcitabine/Carboplatin given weekly starting on 05/11/18.  (dose delayed on 5/28 due to neutropenia and she will require Granix with subsequent cycles).   PLAN:  Samantha Sullivan is doing well today.  She tolerating the change in chemotherapy well, in regards to the improvement in her neuropathy symptoms and the nausea.  She unfortunately is neutropenic today.  She will not receive chemotherapy today.  She will instead receive Granix x 3 days to help boost her WBC.  I reviewed neutropenic precautions.    Due to her antibiotic allergies, we will not prescribe prophylactic antibiotics.  She will return tomorrow and Thursday for injections, and in 1 week for labs, f/u, and Gemcitabine/Carbo.    She knows to call for any other problems that may develop before the next visit.  A total of (30) minutes of face-to-face time was spent with this patient with greater than 50% of that time in counseling and care-coordination.   Wilber Bihari, NP  05/18/18 1:56 PM Medical Oncology and Hematology [facility] [address] [place], [place] [address] Tel. [phone]    Fax. [phone]

## 2018-05-18 ENCOUNTER — Inpatient Hospital Stay: Payer: PPO

## 2018-05-18 ENCOUNTER — Encounter: Payer: Self-pay | Admitting: Adult Health

## 2018-05-18 ENCOUNTER — Inpatient Hospital Stay (HOSPITAL_BASED_OUTPATIENT_CLINIC_OR_DEPARTMENT_OTHER): Payer: PPO | Admitting: Adult Health

## 2018-05-18 VITALS — BP 124/93 | HR 80 | Temp 98.4°F | Resp 18 | Ht 65.0 in | Wt 163.6 lb

## 2018-05-18 DIAGNOSIS — C50912 Malignant neoplasm of unspecified site of left female breast: Secondary | ICD-10-CM | POA: Diagnosis not present

## 2018-05-18 DIAGNOSIS — D701 Agranulocytosis secondary to cancer chemotherapy: Secondary | ICD-10-CM

## 2018-05-18 DIAGNOSIS — Z87891 Personal history of nicotine dependence: Secondary | ICD-10-CM

## 2018-05-18 DIAGNOSIS — Z95828 Presence of other vascular implants and grafts: Secondary | ICD-10-CM

## 2018-05-18 DIAGNOSIS — T451X5A Adverse effect of antineoplastic and immunosuppressive drugs, initial encounter: Secondary | ICD-10-CM | POA: Diagnosis not present

## 2018-05-18 DIAGNOSIS — Z170 Estrogen receptor positive status [ER+]: Principal | ICD-10-CM

## 2018-05-18 DIAGNOSIS — Z806 Family history of leukemia: Secondary | ICD-10-CM | POA: Diagnosis not present

## 2018-05-18 DIAGNOSIS — Z8041 Family history of malignant neoplasm of ovary: Secondary | ICD-10-CM

## 2018-05-18 DIAGNOSIS — C50411 Malignant neoplasm of upper-outer quadrant of right female breast: Secondary | ICD-10-CM | POA: Diagnosis not present

## 2018-05-18 DIAGNOSIS — Z5111 Encounter for antineoplastic chemotherapy: Secondary | ICD-10-CM | POA: Diagnosis not present

## 2018-05-18 DIAGNOSIS — Z9013 Acquired absence of bilateral breasts and nipples: Secondary | ICD-10-CM

## 2018-05-18 DIAGNOSIS — Z171 Estrogen receptor negative status [ER-]: Secondary | ICD-10-CM

## 2018-05-18 LAB — CMP (CANCER CENTER ONLY)
ALT: 48 U/L (ref 0–55)
AST: 45 U/L — ABNORMAL HIGH (ref 5–34)
Albumin: 3.7 g/dL (ref 3.5–5.0)
Alkaline Phosphatase: 113 U/L (ref 40–150)
Anion gap: 8 (ref 3–11)
BUN: 9 mg/dL (ref 7–26)
CO2: 25 mmol/L (ref 22–29)
Calcium: 9.3 mg/dL (ref 8.4–10.4)
Chloride: 106 mmol/L (ref 98–109)
Creatinine: 0.61 mg/dL (ref 0.60–1.10)
GFR, Est AFR Am: >60 mL/min (ref >60)
GFR, Estimated: >60 mL/min (ref >60)
Glucose, Bld: 89 mg/dL (ref 70–140)
Potassium: 3.9 mmol/L (ref 3.5–5.1)
Sodium: 139 mmol/L (ref 136–145)
Total Bilirubin: 0.6 mg/dL (ref 0.2–1.2)
Total Protein: 6 g/dL — ABNORMAL LOW (ref 6.4–8.3)

## 2018-05-18 LAB — CBC WITH DIFFERENTIAL (CANCER CENTER ONLY)
Basophils Absolute: 0 10*3/uL (ref 0.0–0.1)
Basophils Relative: 2 %
Eosinophils Absolute: 0 10*3/uL (ref 0.0–0.5)
Eosinophils Relative: 1 %
HCT: 26.4 % — ABNORMAL LOW (ref 34.8–46.6)
Hemoglobin: 8.8 g/dL — ABNORMAL LOW (ref 11.6–15.9)
Lymphocytes Relative: 48 %
Lymphs Abs: 0.6 10*3/uL — ABNORMAL LOW (ref 0.9–3.3)
MCH: 31.6 pg (ref 25.1–34.0)
MCHC: 33.4 g/dL (ref 31.5–36.0)
MCV: 94.3 fL (ref 79.5–101.0)
Monocytes Absolute: 0.1 10*3/uL (ref 0.1–0.9)
Monocytes Relative: 7 %
Neutro Abs: 0.5 10*3/uL — ABNORMAL LOW (ref 1.5–6.5)
Neutrophils Relative %: 42 %
Platelet Count: 115 10*3/uL — ABNORMAL LOW (ref 145–400)
RBC: 2.8 MIL/uL — ABNORMAL LOW (ref 3.70–5.45)
RDW: 17.8 % — ABNORMAL HIGH (ref 11.2–14.5)
WBC Count: 1.3 10*3/uL — ABNORMAL LOW (ref 3.9–10.3)

## 2018-05-18 MED ORDER — TBO-FILGRASTIM 480 MCG/0.8ML ~~LOC~~ SOSY
PREFILLED_SYRINGE | SUBCUTANEOUS | Status: AC
  Filled 2018-05-18: qty 0.8

## 2018-05-18 MED ORDER — SODIUM CHLORIDE 0.9% FLUSH
10.0000 mL | INTRAVENOUS | Status: DC | PRN
  Administered 2018-05-18: 10 mL
  Filled 2018-05-18: qty 10

## 2018-05-18 MED ORDER — HEPARIN SOD (PORK) LOCK FLUSH 100 UNIT/ML IV SOLN
500.0000 [IU] | Freq: Once | INTRAVENOUS | Status: AC | PRN
  Administered 2018-05-18: 500 [IU]
  Filled 2018-05-18: qty 5

## 2018-05-18 MED ORDER — TBO-FILGRASTIM 480 MCG/0.8ML ~~LOC~~ SOSY
480.0000 ug | PREFILLED_SYRINGE | Freq: Once | SUBCUTANEOUS | Status: AC
  Administered 2018-05-18: 480 ug via SUBCUTANEOUS

## 2018-05-18 NOTE — Patient Instructions (Addendum)
Tbo-Filgrastim injection What is this medicine? TBO-FILGRASTIM (T B O fil GRA stim) is a granulocyte colony-stimulating factor that stimulates the growth of neutrophils, a type of white blood cell important in the body's fight against infection. It is used to reduce the incidence of fever and infection in patients with certain types of cancer who are receiving chemotherapy that affects the bone marrow. This medicine may be used for other purposes; ask your health care provider or pharmacist if you have questions. COMMON BRAND NAME(S): Granix What should I tell my health care provider before I take this medicine? They need to know if you have any of these conditions: -bone scan or tests planned -kidney disease -sickle cell anemia -an unusual or allergic reaction to tbo-filgrastim, filgrastim, pegfilgrastim, other medicines, foods, dyes, or preservatives -pregnant or trying to get pregnant -breast-feeding How should I use this medicine? This medicine is for injection under the skin. If you get this medicine at home, you will be taught how to prepare and give this medicine. Refer to the Instructions for Use that come with your medication packaging. Use exactly as directed. Take your medicine at regular intervals. Do not take your medicine more often than directed. It is important that you put your used needles and syringes in a special sharps container. Do not put them in a trash can. If you do not have a sharps container, call your pharmacist or healthcare provider to get one. Talk to your pediatrician regarding the use of this medicine in children. Special care may be needed. Overdosage: If you think you have taken too much of this medicine contact a poison control center or emergency room at once. NOTE: This medicine is only for you. Do not share this medicine with others. What if I miss a dose? It is important not to miss your dose. Call your doctor or health care professional if you miss a  dose. What may interact with this medicine? This medicine may interact with the following medications: -medicines that may cause a release of neutrophils, such as lithium This list may not describe all possible interactions. Give your health care provider a list of all the medicines, herbs, non-prescription drugs, or dietary supplements you use. Also tell them if you smoke, drink alcohol, or use illegal drugs. Some items may interact with your medicine. What should I watch for while using this medicine? You may need blood work done while you are taking this medicine. What side effects may I notice from receiving this medicine? Side effects that you should report to your doctor or health care professional as soon as possible: -allergic reactions like skin rash, itching or hives, swelling of the face, lips, or tongue -blood in the urine -dark urine -dizziness -fast heartbeat -feeling faint -shortness of breath or breathing problems -signs and symptoms of infection like fever or chills; cough; or sore throat -signs and symptoms of kidney injury like trouble passing urine or change in the amount of urine -stomach or side pain, or pain at the shoulder -sweating -swelling of the legs, ankles, or abdomen -tiredness Side effects that usually do not require medical attention (report to your doctor or health care professional if they continue or are bothersome): -bone pain -headache -muscle pain -vomiting This list may not describe all possible side effects. Call your doctor for medical advice about side effects. You may report side effects to FDA at 1-800-FDA-1088. Where should I keep my medicine? Keep out of the reach of children. Store in a refrigerator between   2 and 8 degrees C (36 and 46 degrees F). Keep in carton to protect from light. Throw away this medicine if it is left out of the refrigerator for more than 5 consecutive days. Throw away any unused medicine after the expiration  date. NOTE: This sheet is a summary. It may not cover all possible information. If you have questions about this medicine, talk to your doctor, pharmacist, or health care provider.  2018 Elsevier/Gold Standard (2016-01-28 19:07:04)    Central Line, Adult A central line is a thin, flexible tube (catheter) that is put in your vein. It can be used to:  Take blood for lab tests.  Give you medicine.  Give you food and nutrients.  The procedure may vary among doctors and hospitals. Follow these instructions at home: Caring for the tube  Follow instructions from your doctor about: ? Flushing the tube with saline solution. ? Cleaning the tube and the area around it.  Only flush with clean (sterile) supplies. The supplies should be from your doctor, a pharmacy, or another place that your doctor recommends.  Before you flush the tube or clean the area around the tube: ? Wash your hands with soap and water. If you cannot use soap and water, use hand sanitizer. ? Clean the central line hub with rubbing alcohol. Caring for your skin  Keep the area where the tube was put in clean and dry.  Every day, and when changing the bandage, check the skin around the central line for: ? Redness, swelling, or pain. ? Fluid or blood. ? Warmth. ? Pus. ? A bad smell. General instructions  Keep the tube clamped, unless it is being used.  Keep your supplies in a clean, dry location.  If you or someone else accidentally pulls on the tube, make sure: ? The bandage (dressing) is okay. ? There is no bleeding. ? The tube has not been pulled out.  Do not use scissors or sharp objects near the tube.  Do not swim or let the tube soak in a tub.  Ask your doctor what activities are safe for you. Your doctor may tell you not to lift anything or move your arm too much.  Take over-the-counter and prescription medicines only as told by your doctor.  Change bandages as told by your doctor.  Keep your  bandage dry. If a bandage gets wet, have it changed right away.  Keep all follow-up visits as told by your doctor. This is important. Throwing away supplies  Throw away any syringes in a trash (disposal) container that is only for sharp items (sharps container). You can buy a sharps container from a pharmacy, or you can make one by using an empty hard plastic bottle with a cover.  Place any used bandages or infusion bags into a plastic bag. Throw that bag in the trash. Contact a doctor if:  You have any of these where the tube was put in: ? Redness, swelling, or pain. ? Fluid or blood. ? A warm feeling. ? Pus or a bad smell. Get help right away if:  You have: ? A fever. ? Chills. ? Trouble getting enough air (shortness of breath). ? Trouble breathing. ? Pain in your chest. ? Swelling in your neck, face, chest, or arm.  You are coughing.  You feel your heart beating fast or skipping beats.  You feel dizzy or you pass out (faint).  There are red lines coming from where the tube was put in.  The   area where the tube was put in is bleeding and the bleeding will not stop.  Your tube is hard to flush.  You do not get a blood return from the tube.  The tube gets loose or comes out.  The tube has a hole or a tear.  The tube leaks. Summary  A central line is a thin, flexible tube (catheter) that is put in your vein. It can be used to take blood for lab tests or to give you medicine.  Follow instructions from your doctor about flushing and cleaning the tube.  Keep the area where the tube was put in clean and dry.  Ask your doctor what activities are safe for you. This information is not intended to replace advice given to you by your health care provider. Make sure you discuss any questions you have with your health care provider. Document Released: 11/24/2012 Document Revised: 12/25/2016 Document Reviewed: 12/25/2016 Elsevier Interactive Patient Education  2017 [employer]  

## 2018-05-19 ENCOUNTER — Inpatient Hospital Stay: Payer: PPO

## 2018-05-19 ENCOUNTER — Telehealth: Payer: Self-pay | Admitting: Adult Health

## 2018-05-19 VITALS — BP 129/59 | HR 92 | Temp 98.3°F | Resp 18

## 2018-05-19 DIAGNOSIS — Z95828 Presence of other vascular implants and grafts: Secondary | ICD-10-CM

## 2018-05-19 DIAGNOSIS — Z5111 Encounter for antineoplastic chemotherapy: Secondary | ICD-10-CM | POA: Diagnosis not present

## 2018-05-19 MED ORDER — TBO-FILGRASTIM 480 MCG/0.8ML ~~LOC~~ SOSY
PREFILLED_SYRINGE | SUBCUTANEOUS | Status: AC
  Filled 2018-05-19: qty 0.8

## 2018-05-19 MED ORDER — TBO-FILGRASTIM 480 MCG/0.8ML ~~LOC~~ SOSY
480.0000 ug | PREFILLED_SYRINGE | Freq: Once | SUBCUTANEOUS | Status: AC
  Administered 2018-05-19: 480 ug via SUBCUTANEOUS

## 2018-05-19 NOTE — Telephone Encounter (Signed)
Called regarding 6/4 °

## 2018-05-20 ENCOUNTER — Inpatient Hospital Stay: Payer: PPO

## 2018-05-20 VITALS — BP 123/68 | HR 89 | Temp 98.0°F | Resp 16

## 2018-05-20 DIAGNOSIS — Z95828 Presence of other vascular implants and grafts: Secondary | ICD-10-CM

## 2018-05-20 DIAGNOSIS — Z5111 Encounter for antineoplastic chemotherapy: Secondary | ICD-10-CM | POA: Diagnosis not present

## 2018-05-20 MED ORDER — TBO-FILGRASTIM 480 MCG/0.8ML ~~LOC~~ SOSY
480.0000 ug | PREFILLED_SYRINGE | Freq: Once | SUBCUTANEOUS | Status: AC
  Administered 2018-05-20: 480 ug via SUBCUTANEOUS

## 2018-05-20 NOTE — Patient Instructions (Signed)
Tbo-Filgrastim injection What is this medicine? TBO-FILGRASTIM (T B O fil GRA stim) is a granulocyte colony-stimulating factor that stimulates the growth of neutrophils, a type of white blood cell important in the body's fight against infection. It is used to reduce the incidence of fever and infection in patients with certain types of cancer who are receiving chemotherapy that affects the bone marrow. This medicine may be used for other purposes; ask your health care provider or pharmacist if you have questions. COMMON BRAND NAME(S): Granix What should I tell my health care provider before I take this medicine? They need to know if you have any of these conditions: -bone scan or tests planned -kidney disease -sickle cell anemia -an unusual or allergic reaction to tbo-filgrastim, filgrastim, pegfilgrastim, other medicines, foods, dyes, or preservatives -pregnant or trying to get pregnant -breast-feeding How should I use this medicine? This medicine is for injection under the skin. If you get this medicine at home, you will be taught how to prepare and give this medicine. Refer to the Instructions for Use that come with your medication packaging. Use exactly as directed. Take your medicine at regular intervals. Do not take your medicine more often than directed. It is important that you put your used needles and syringes in a special sharps container. Do not put them in a trash can. If you do not have a sharps container, call your pharmacist or healthcare provider to get one. Talk to your pediatrician regarding the use of this medicine in children. Special care may be needed. Overdosage: If you think you have taken too much of this medicine contact a poison control center or emergency room at once. NOTE: This medicine is only for you. Do not share this medicine with others. What if I miss a dose? It is important not to miss your dose. Call your doctor or health care professional if you miss a  dose. What may interact with this medicine? This medicine may interact with the following medications: -medicines that may cause a release of neutrophils, such as lithium This list may not describe all possible interactions. Give your health care provider a list of all the medicines, herbs, non-prescription drugs, or dietary supplements you use. Also tell them if you smoke, drink alcohol, or use illegal drugs. Some items may interact with your medicine. What should I watch for while using this medicine? You may need blood work done while you are taking this medicine. What side effects may I notice from receiving this medicine? Side effects that you should report to your doctor or health care professional as soon as possible: -allergic reactions like skin rash, itching or hives, swelling of the face, lips, or tongue -blood in the urine -dark urine -dizziness -fast heartbeat -feeling faint -shortness of breath or breathing problems -signs and symptoms of infection like fever or chills; cough; or sore throat -signs and symptoms of kidney injury like trouble passing urine or change in the amount of urine -stomach or side pain, or pain at the shoulder -sweating -swelling of the legs, ankles, or abdomen -tiredness Side effects that usually do not require medical attention (report to your doctor or health care professional if they continue or are bothersome): -bone pain -headache -muscle pain -vomiting This list may not describe all possible side effects. Call your doctor for medical advice about side effects. You may report side effects to FDA at 1-800-FDA-1088. Where should I keep my medicine? Keep out of the reach of children. Store in a refrigerator between   2 and 8 degrees C (36 and 46 degrees F). Keep in carton to protect from light. Throw away this medicine if it is left out of the refrigerator for more than 5 consecutive days. Throw away any unused medicine after the expiration  date. NOTE: This sheet is a summary. It may not cover all possible information. If you have questions about this medicine, talk to your doctor, pharmacist, or health care provider.  2018 [employer]vier/Gold Standard (2016-01-28 19:07:04)  

## 2018-05-25 ENCOUNTER — Inpatient Hospital Stay: Payer: PPO

## 2018-05-25 ENCOUNTER — Encounter: Payer: Self-pay | Admitting: Adult Health

## 2018-05-25 ENCOUNTER — Inpatient Hospital Stay (HOSPITAL_BASED_OUTPATIENT_CLINIC_OR_DEPARTMENT_OTHER): Payer: PPO | Admitting: Adult Health

## 2018-05-25 ENCOUNTER — Ambulatory Visit: Payer: PPO | Admitting: Adult Health

## 2018-05-25 ENCOUNTER — Other Ambulatory Visit: Payer: PPO

## 2018-05-25 ENCOUNTER — Inpatient Hospital Stay: Payer: PPO | Attending: Oncology

## 2018-05-25 VITALS — BP 127/85 | HR 72 | Temp 98.4°F | Resp 18 | Ht 65.0 in | Wt 167.2 lb

## 2018-05-25 DIAGNOSIS — Z5111 Encounter for antineoplastic chemotherapy: Secondary | ICD-10-CM | POA: Diagnosis not present

## 2018-05-25 DIAGNOSIS — G620 Drug-induced polyneuropathy: Secondary | ICD-10-CM | POA: Insufficient documentation

## 2018-05-25 DIAGNOSIS — Z806 Family history of leukemia: Secondary | ICD-10-CM | POA: Insufficient documentation

## 2018-05-25 DIAGNOSIS — Z87891 Personal history of nicotine dependence: Secondary | ICD-10-CM

## 2018-05-25 DIAGNOSIS — Z170 Estrogen receptor positive status [ER+]: Secondary | ICD-10-CM | POA: Diagnosis not present

## 2018-05-25 DIAGNOSIS — D701 Agranulocytosis secondary to cancer chemotherapy: Secondary | ICD-10-CM | POA: Insufficient documentation

## 2018-05-25 DIAGNOSIS — Z9013 Acquired absence of bilateral breasts and nipples: Secondary | ICD-10-CM

## 2018-05-25 DIAGNOSIS — C50411 Malignant neoplasm of upper-outer quadrant of right female breast: Secondary | ICD-10-CM | POA: Insufficient documentation

## 2018-05-25 DIAGNOSIS — Z923 Personal history of irradiation: Secondary | ICD-10-CM | POA: Insufficient documentation

## 2018-05-25 DIAGNOSIS — Z803 Family history of malignant neoplasm of breast: Secondary | ICD-10-CM

## 2018-05-25 DIAGNOSIS — C50912 Malignant neoplasm of unspecified site of left female breast: Secondary | ICD-10-CM | POA: Diagnosis not present

## 2018-05-25 DIAGNOSIS — C50812 Malignant neoplasm of overlapping sites of left female breast: Secondary | ICD-10-CM

## 2018-05-25 DIAGNOSIS — Z171 Estrogen receptor negative status [ER-]: Secondary | ICD-10-CM | POA: Diagnosis not present

## 2018-05-25 DIAGNOSIS — Z5189 Encounter for other specified aftercare: Secondary | ICD-10-CM | POA: Insufficient documentation

## 2018-05-25 DIAGNOSIS — Z8041 Family history of malignant neoplasm of ovary: Secondary | ICD-10-CM | POA: Diagnosis not present

## 2018-05-25 DIAGNOSIS — Z801 Family history of malignant neoplasm of trachea, bronchus and lung: Secondary | ICD-10-CM | POA: Insufficient documentation

## 2018-05-25 DIAGNOSIS — Z95828 Presence of other vascular implants and grafts: Secondary | ICD-10-CM

## 2018-05-25 DIAGNOSIS — T451X5A Adverse effect of antineoplastic and immunosuppressive drugs, initial encounter: Secondary | ICD-10-CM | POA: Diagnosis not present

## 2018-05-25 LAB — CBC WITH DIFFERENTIAL (CANCER CENTER ONLY)
Basophils Absolute: 0 10*3/uL (ref 0.0–0.1)
Basophils Relative: 1 %
EOS ABS: 0.1 10*3/uL (ref 0.0–0.5)
Eosinophils Relative: 4 %
HEMATOCRIT: 29.5 % — AB (ref 34.8–46.6)
HEMOGLOBIN: 9.5 g/dL — AB (ref 11.6–15.9)
LYMPHS ABS: 0.9 10*3/uL (ref 0.9–3.3)
Lymphocytes Relative: 44 %
MCH: 32 pg (ref 25.1–34.0)
MCHC: 32.2 g/dL (ref 31.5–36.0)
MCV: 99.3 fL (ref 79.5–101.0)
MONO ABS: 0.4 10*3/uL (ref 0.1–0.9)
MONOS PCT: 20 %
Neutro Abs: 0.6 10*3/uL — ABNORMAL LOW (ref 1.5–6.5)
Neutrophils Relative %: 31 %
Platelet Count: 132 10*3/uL — ABNORMAL LOW (ref 145–400)
RBC: 2.97 MIL/uL — ABNORMAL LOW (ref 3.70–5.45)
RDW: 18.5 % — ABNORMAL HIGH (ref 11.2–14.5)
WBC Count: 2 10*3/uL — ABNORMAL LOW (ref 3.9–10.3)

## 2018-05-25 LAB — CMP (CANCER CENTER ONLY)
ALBUMIN: 3.6 g/dL (ref 3.5–5.0)
ALT: 13 U/L (ref 0–55)
AST: 16 U/L (ref 5–34)
Alkaline Phosphatase: 170 U/L — ABNORMAL HIGH (ref 40–150)
Anion gap: 9 (ref 3–11)
BUN: 9 mg/dL (ref 7–26)
CHLORIDE: 107 mmol/L (ref 98–109)
CO2: 24 mmol/L (ref 22–29)
CREATININE: 0.63 mg/dL (ref 0.60–1.10)
Calcium: 9.1 mg/dL (ref 8.4–10.4)
GFR, Estimated: >60 mL/min (ref >60)
GLUCOSE: 98 mg/dL (ref 70–140)
Potassium: 4.1 mmol/L (ref 3.5–5.1)
SODIUM: 140 mmol/L (ref 136–145)
Total Bilirubin: 0.3 mg/dL (ref 0.2–1.2)
Total Protein: 5.8 g/dL — ABNORMAL LOW (ref 6.4–8.3)

## 2018-05-25 MED ORDER — SODIUM CHLORIDE 0.9% FLUSH
10.0000 mL | INTRAVENOUS | Status: DC | PRN
  Administered 2018-05-25: 10 mL
  Filled 2018-05-25: qty 10

## 2018-05-25 MED ORDER — HEPARIN SOD (PORK) LOCK FLUSH 100 UNIT/ML IV SOLN
500.0000 [IU] | Freq: Once | INTRAVENOUS | Status: AC | PRN
  Administered 2018-05-25: 500 [IU]
  Filled 2018-05-25: qty 5

## 2018-05-25 MED ORDER — PEGFILGRASTIM INJECTION 6 MG/0.6ML ~~LOC~~
6.0000 mg | PREFILLED_SYRINGE | Freq: Once | SUBCUTANEOUS | Status: AC
  Administered 2018-05-25: 6 mg via SUBCUTANEOUS

## 2018-05-25 MED ORDER — PEGFILGRASTIM INJECTION 6 MG/0.6ML ~~LOC~~
PREFILLED_SYRINGE | SUBCUTANEOUS | Status: AC
  Filled 2018-05-25: qty 0.6

## 2018-05-25 NOTE — Progress Notes (Addendum)
[place]  [phone] Fax:([phone]     ID: [name] DOB: November 20, 1951  MR#: [id number]  WVP#:[id number]  Patient Care Team: Shon Baton, MD as PCP - General (Internal Medicine) Magrinat, Virgie Dad, MD as Consulting Physician (Oncology) Kem Boroughs, Boykins as Nurse Practitioner (Family Medicine) Regal, Tamala Fothergill, DPM as Consulting Physician (Podiatry) Excell Seltzer, MD as Consulting Physician (General Surgery) Force, Shari Prows, DO as Referring Physician (Student) PCP: Shon Baton, MD OTHER MD:  CHIEF COMPLAINT: Contralateral breast cancer, triple negative  CURRENT TREATMENT: Adjuvant chemotherapy  INTERVAL HISTORY: [name] is here today for evaluation prior to receiving her weekly Gemcitabine and Carboplatin for her triple negative breast cancer.  She is receiving this on weekly basis. Her dose was held last week due to neutropenia.      REVIEW OF SYSTEMS: [name] is doing well today. She feels better and tolerated the Granix she received last week well.  She denies any issues today such as fevers, chills, nausea, vomiting, chest pain, palpitations, cough, shortness of breath.  Her neuropathy symptoms have improved.  A detailed ROS is non contributory.    RIGHT BREAST CANCER HISTORY:  From the original intake note:  "[name]"noted a lump in her right breast sometime in late October or early November, but there were "so many things going on in her life" she did not bring it to medical attention until she saw [name] 12/11/2015. [name] was able to palpate a mass at the 9:30 o'clock position in the right breast, which was mobile and nontender. The left breast is status post prior remote biopsy and there was some scar tissue at the 7:00 position. The patient was set up for bilateral diagnostic Mammography with tomosynthesis at [facility] 12/27/2015. This found the breast density to be category C. There was a new oval mass in the right breast upper  outer quadrant. Ultrasound the same day confirmed a 2.2 cm mass with irregular margins in the right breast at the 9:00 position read as highly suggestive of malignancy.  Biopsy of the right breast mass in question 01/02/2016 found ([id number]) an invasive ductal carcinoma, grade 1, estrogen receptor 100% positive, progesterone receptor 90% positive, both with strong staining intensity, with an MIB-1 of 10%, and no HER-2 amplification, the signals ratio being 1.32 and the number per cell 2.05. The patient was informed and instructed to stop hormone replacement therapy (last dose 01/04/2016).  Her subsequent history is as detailed below.    PAST MEDICAL HISTORY: Past Medical History:  Diagnosis Date  . Abnormal CT of the chest 12/06   numeroous lung nodules - most have resolved.  . Arthritis    psoriatic arthritis- toes & feet, but resolved at this point- no need meds at this point    . Cancer [facility])    right breast cancer  . Complication of anesthesia    /w colonoscopy- woke up before procedure was complete  . Family history of adverse reaction to anesthesia    pt's mother reported difficulty being given " enough " medicine to outcome a successful anesth. experience  . Family history of leukemia   . Family history of ovarian cancer   . History of meningioma 2007   in 2013 stable on MRI and recheck in 5 years, no surgery necessry   . HSV infection 1970's- 1980's  . Psoriatic arthritis ([place])     PAST SURGICAL HISTORY: Past Surgical History:  Procedure Laterality Date  . BILATERAL TOTAL MASTECTOMY WITH AXILLARY LYMPH  NODE DISSECTION Bilateral 01/04/2018   Procedure: BILATERAL TOTAL MASTECTOMY PROPHYLATIC RIGHT  WITH LEFT SENTINEAL  LYMPH NODE;  Surgeon: Excell Seltzer, MD;  Location: [place];  Service: General;  Laterality: Bilateral;  . BREAST LUMPECTOMY WITH RADIOACTIVE SEED AND SENTINEL LYMPH NODE BIOPSY Right 01/22/2016   Procedure: RIGHT BREAST LUMPECTOMY WITH RADIOACTIVE SEED AND  SENTINEL LYMPH NODE BIOPSY;  Surgeon: Excell Seltzer, MD;  Location: [place];  Service: General;  Laterality: Right;  . BREAST SURGERY Left 1971, 2016   lumpectomy benign, 2016- Br. Ca- R lumpectomy & radiation   . COLONOSCOPY  8/06   normal and recheck in 10 years  . HAMMER TOE SURGERY Left 2016  . MASTECTOMY Right 01/04/2018   PROPHYLATIC   . MASTECTOMY COMPLETE / SIMPLE W/ SENTINEL NODE BIOPSY Left 01/04/2018  . PORTACATH PLACEMENT Right 01/04/2018   Procedure: INSERTION PORT-A-CATH;  Surgeon: Excell Seltzer, MD;  Location: [place];  Service: General;  Laterality: Right;  . THYROIDECTOMY, PARTIAL Left 1983    FAMILY HISTORY Family History  Problem Relation Age of Onset  . Osteoporosis Mother   . Osteoporosis Sister   . Lung cancer Maternal Aunt        smoker  . Heart Problems Maternal Grandmother   . COPD Maternal Grandfather   . Neurodegenerative disease Maternal Aunt        corical-ganglion degeneration  . Ovarian cancer Maternal Aunt        dx in her 48s  . Leukemia Cousin        1s-40s; maternal cousin  [name] has essentially no information on her father's side of the family aside from the fact that he died in his 16s from heart disease. He had psoriasis. The patient's mother passed away 2017-03-22 age 61, due to breast cancer. The patient had 2 brothers who died from congenital heart disease shortly after birth. The patient has one sister. The patient's mother had one sister, the patient's aunt, diagnosed with ovarian cancer in her 58s.  GYNECOLOGIC HISTORY:  Patient's last menstrual period was 03/22/2000 (approximate). Menarche age 32, first live birth age 63. The patient is GX P2. She went through menopause approximately 1990 but was taking hormone replacement until 01/04/2016. Since coming off replacement she has had insomnia but no hot flashes or vaginal dryness pleural bones so 4. 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 She is divorced and lives alone, with no pets. Her daughter [name] lives in [place]. She is disabled secondary to schizoaffective disorder. Daughter [name] lives in [place] and she is a Estate agent but mostly a homemaker. The patient has 3 grandchildren, the older 96, the other 2 being under 65 years old. The patient attends [employer]. Her mother passed away in 03-22-2017 due to breast cancer.    ADVANCED DIRECTIVES: her daughter [name] is her healthcare power of attorney. She can be reached at [place]: Social History   Tobacco Use  . Smoking status: Former Smoker    Packs/day: 1.00    Years: 12.00    Pack years: 12.00    Types: Cigarettes    Last attempt to quit: 07/23/1981    Years since quitting: 36.8  . Smokeless tobacco: Never Used  Substance Use Topics  . Alcohol use: Yes    Alcohol/week: 0.0 oz  Comment: social occasion only- special   . Drug use: No     Colonoscopy: ; [facility]  VXB:LTJQZES 2016/[place]  Bone density:rJanuary 2018/normal   Lipid panel:  Allergies  Allergen Reactions  . Bee Venom   . Ciprofloxacin   . Levaquin [Levofloxacin In D5w]   . Norco [Hydrocodone-Acetaminophen] Other (See Comments)    Pt states Norco made her extremely hyper after her surgery and she was not able to sleep.    Current Outpatient Medications  Medication Sig Dispense Refill  . folic acid (FOLVITE) 1 MG tablet Take 2 mg by mouth every evening.    . lidocaine-prilocaine (EMLA) cream Apply to affected area once 30 g 3  . LORazepam (ATIVAN) 0.5 MG tablet Take 1 tablet (0.5 mg total) by mouth at bedtime as needed and may repeat dose one time if needed (Nausea or vomiting). 10 tablet 0  . Polyethyl Glycol-Propyl Glycol (LUBRICANT EYE DROPS) 0.4-0.3 % SOLN Place 1-2 drops into both eyes 3  (three) times daily as needed (dry eyes/irritated eyes.).    Marland Kitchen prochlorperazine (COMPAZINE) 10 MG tablet Take 1 tablet (10 mg total) by mouth every 6 (six) hours as needed (Nausea or vomiting). 30 tablet 1   Current Facility-Administered Medications  Medication Dose Route Frequency Provider Last Rate Last Dose  . triamcinolone acetonide (KENALOG) 10 MG/ML injection 10 mg  10 mg Other Once Wallene Huh, DPM        OBJECTIVE:  Vitals:   05/25/18 0838  BP: 127/85  Pulse: 72  Resp: 18  Temp: 98.4 F (36.9 C)  SpO2: 100%     Body mass index is 27.82 kg/m.   ECOG FS:1 - Symptomatic but completely ambulatory GENERAL: Patient is a well appearing female in no acute distress HEENT:  Sclerae anicteric.  Oropharynx clear and moist. No ulcerations or evidence of oropharyngeal candidiasis. Neck is supple.  NODES:  No cervical, supraclavicular, or axillary lymphadenopathy palpated.  BREAST EXAM:  Deferred. LUNGS:  Clear to auscultation bilaterally.  No wheezes or rhonchi. HEART:  Regular rate and rhythm. No murmur appreciated. ABDOMEN:  Soft, nontender.  Positive, normoactive bowel sounds. No organomegaly palpated. MSK:  No focal spinal tenderness to palpation. Full range of motion bilaterally in the upper extremities. EXTREMITIES:  No peripheral edema.   SKIN:  Clear with no obvious rashes or skin changes. + color change to the fingernail in a majority of her fingernails.   NEURO:  Nonfocal. Well oriented.  Appropriate affect.     LAB RESULTS:  CMP     Component Value Date/Time   NA 139 05/18/2018 1311   NA 141 08/05/2017 1335   K 3.9 05/18/2018 1311   K 4.5 08/05/2017 1335   CL 106 05/18/2018 1311   CO2 25 05/18/2018 1311   CO2 29 08/05/2017 1335   GLUCOSE 89 05/18/2018 1311   GLUCOSE 77 08/05/2017 1335   BUN 9 05/18/2018 1311   BUN 13.4 08/05/2017 1335   CREATININE 0.61 05/18/2018 1311   CREATININE 0.8 08/05/2017 1335   CALCIUM 9.3 05/18/2018 1311   CALCIUM 10.0  08/05/2017 1335   PROT 6.0 (L) 05/18/2018 1311   PROT 6.8 08/05/2017 1335   ALBUMIN 3.7 05/18/2018 1311   ALBUMIN 3.6 08/05/2017 1335   AST 45 (H) 05/18/2018 1311   AST 19 08/05/2017 1335   ALT 48 05/18/2018 1311   ALT 12 08/05/2017 1335   ALKPHOS 113 05/18/2018 1311   ALKPHOS 169 (H) 08/05/2017 1335   BILITOT 0.6 05/18/2018 1311  BILITOT 0.63 08/05/2017 1335   GFRNONAA >60 05/18/2018 1311   GFRAA >60 05/18/2018 1311    INo results found for: SPEP, UPEP  Lab Results  Component Value Date   WBC 2.0 (L) 05/25/2018   NEUTROABS 0.6 (L) 05/25/2018   HGB 9.5 (L) 05/25/2018   HCT 29.5 (L) 05/25/2018   MCV 99.3 05/25/2018   PLT 132 (L) 05/25/2018      Chemistry      Component Value Date/Time   NA 139 05/18/2018 1311   NA 141 08/05/2017 1335   K 3.9 05/18/2018 1311   K 4.5 08/05/2017 1335   CL 106 05/18/2018 1311   CO2 25 05/18/2018 1311   CO2 29 08/05/2017 1335   BUN 9 05/18/2018 1311   BUN 13.4 08/05/2017 1335   CREATININE 0.61 05/18/2018 1311   CREATININE 0.8 08/05/2017 1335      Component Value Date/Time   CALCIUM 9.3 05/18/2018 1311   CALCIUM 10.0 08/05/2017 1335   ALKPHOS 113 05/18/2018 1311   ALKPHOS 169 (H) 08/05/2017 1335   AST 45 (H) 05/18/2018 1311   AST 19 08/05/2017 1335   ALT 48 05/18/2018 1311   ALT 12 08/05/2017 1335   BILITOT 0.6 05/18/2018 1311   BILITOT 0.63 08/05/2017 1335       No results found for: LABCA2  No components found for: QMGNO037  No results for input(s): INR in the last 168 hours.  Urinalysis    Component Value Date/Time   BILIRUBINUR neg 12/11/2015 0925   PROTEINUR neg 12/11/2015 0925   UROBILINOGEN negative 12/11/2015 0925   NITRITE neg 12/11/2015 0925   LEUKOCYTESUR Negative 12/11/2015 0925    STUDIES: No results found.  RESEARCH: enrolled in UPBEAT study; not a candidate for BR 003 because of concurrent cancer  ASSESSMENT: 66 y.o. [place] woman status post right breast upper outer quadrant biopsy  01/02/2016 for a clinical T2 N0, stage IIA invasive ductal carcinoma, grade 1, estrogen and progesterone receptor positive, HER-2 not amplified, with an MIB-1 of 10%.  (1). Right lumpectomy and sentinel lymph node sampling 01/22/2016 showed a pT1c pN0, stage IA invasive ductal carcinoma, grade 1, with close but negative margins. Repeat HER-2 testing was again not amplified   (3) Oncotype score of 18 predicts a risk of recurrence outside the breast within the next 10 years of 11% if the patient's only systemic therapy is tamoxifen for 5 years. It also predicts no significant benefit from chemotherapy.     (4) adjuvant radiation3/14/17-4/11/17 Site/dose:   Right breast - 42.72 gy at 2.67 Gy per fraction x 16 fractions followed by an electron boost to the right breast with 10 Gy at 2 Gy per fraction x 5 fractions.    (5)  Anastrozole started 04/21/2016, discontinued after approximately 1 month with significant side effects  (a)  Bone density at [facility] September 2013 was normal  (b) bone density January 2018 was normal  (c) anastrozole restarted February 2018, held again at the start of chemotherapy February 2019  (6) genetics testing  02/24/2016 through the Breast/Ovarian cancer gene panel offered by [facility] found no deleterious mutations in ATM, BARD1, BRCA1, BRCA2, BRIP1, CDH1, CHEK2, EPCAM, FANCC, MLH1, MSH2, MSH6, NBN, PALB2, PMS2, PTEN, RAD51C, RAD51D, TP53, and XRCC2.   (7) left breast biopsy 12/17/2017 showed a cT2 cN0, stage IIA- IIB invasive ductal carcinoma, grade 2, estrogen receptor weakly positive, progesterone receptor negative, with HER-2 equivocal by FISH, negative by immunohistochemistry  (8) status post bilateral mastectomies and left sentinel  lymph node sampling 01/04/2018 showing  (a) on the right, no evidence of disease  (b) on the left, a pT2 pN0, stage IIB invasive ductal carcinoma, grade 3, triple negative  (9) adjuvant chemotherapy consisting of cyclophosphamide  and doxorubicin in dose dense fashion x4 started 02/16/2018, completed 03/30/2018,  followed by weekly paclitaxel /carboplatin x12 starting 04/13/2018; Paclitaxel/Carboplatin stopped after 3 cycles due to peripheral neuropathy.  She will start Gemcitabine/Carboplatin given weekly starting on 05/11/18.  (dose delayed on 5/28 due to neutropenia and she will require Granix with subsequent cycles).   PLAN:  [name] is doing well today. Unfortunately, her ANC is 0.6 and she did not respond to the three doses of Granix she received last week.  She will not be able to receive chemotherapy today.  She met with Dr. Jana Hakim, who reviewed with her his concern over her bone marrow and that she was not tolerating chemotherapy.  He recommended discontinuation of chemotherapy and to consider Capecitabine 3 tablets BID for 6 months.  He reviewed the side effects with her in detail, along with risks/benefits.  [name] is uncertain about her treatment decisions for the future.  She will return next week to allow her time to consider, and we will discuss again at that time.    Today however, she will receive Neulasta x 1 for her neutropenia.  Neutropenic precautions were again reviewed.    She knows to call for any other problems that may develop before the next visit.       Wilber Bihari, NP  05/25/18 8:48 AM Medical Oncology and Hematology [facility] [address] [place], [place] [address] Tel. [phone]    Fax. [phone]    ADDENDUM: [name] is very disappointed that she was not able to receive the full "recipe" as planned.  I think we have to listen to her body and what her bone marrow is telling us if that it has lost its reserve.  I do not have data to treat her every 2 weeks with Neulasta support even if we thought that would be successful.  I did discuss doing 6 months of capecitabine with her.  We do have some data for that in a slightly different context.  That is generally  well-tolerated.  Today she was not very motivated.  She is going to see me again next week.The one additional thing we can do is obtain a PD-L1 and if she happened to be positive we could consider pembrolizumab.  I called and requested that today.  I personally saw this patient and performed a substantive portion of this encounter with the listed APP documented above.   Chauncey Cruel, MD Medical Oncology and Hematology [facility] [address] [place], [place] [address] Tel. [phone]    Fax. [phone]

## 2018-05-25 NOTE — Patient Instructions (Addendum)
Central Line, Adult A central line is a thin, flexible tube (catheter) that is put in your vein. It can be used to:  Take blood for lab tests.  Give you medicine.  Give you food and nutrients.  The procedure may vary among doctors and hospitals. Follow these instructions at home: Caring for the tube  Follow instructions from your doctor about: ? Flushing the tube with saline solution. ? Cleaning the tube and the area around it.  Only flush with clean (sterile) supplies. The supplies should be from your doctor, a pharmacy, or another place that your doctor recommends.  Before you flush the tube or clean the area around the tube: ? Wash your hands with soap and water. If you cannot use soap and water, use hand sanitizer. ? Clean the central line hub with rubbing alcohol. Caring for your skin  Keep the area where the tube was put in clean and dry.  Every day, and when changing the bandage, check the skin around the central line for: ? Redness, swelling, or pain. ? Fluid or blood. ? Warmth. ? Pus. ? A bad smell. General instructions  Keep the tube clamped, unless it is being used.  Keep your supplies in a clean, dry location.  If you or someone else accidentally pulls on the tube, make sure: ? The bandage (dressing) is okay. ? There is no bleeding. ? The tube has not been pulled out.  Do not use scissors or sharp objects near the tube.  Do not swim or let the tube soak in a tub.  Ask your doctor what activities are safe for you. Your doctor may tell you not to lift anything or move your arm too much.  Take over-the-counter and prescription medicines only as told by your doctor.  Change bandages as told by your doctor.  Keep your bandage dry. If a bandage gets wet, have it changed right away.  Keep all follow-up visits as told by your doctor. This is important. Throwing away supplies  Throw away any syringes in a trash (disposal) container that is only for sharp  items (sharps container). You can buy a sharps container from a pharmacy, or you can make one by using an empty hard plastic bottle with a cover.  Place any used bandages or infusion bags into a plastic bag. Throw that bag in the trash. Contact a doctor if:  You have any of these where the tube was put in: ? Redness, swelling, or pain. ? Fluid or blood. ? A warm feeling. ? Pus or a bad smell. Get help right away if:  You have: ? A fever. ? Chills. ? Trouble getting enough air (shortness of breath). ? Trouble breathing. ? Pain in your chest. ? Swelling in your neck, face, chest, or arm.  You are coughing.  You feel your heart beating fast or skipping beats.  You feel dizzy or you pass out (faint).  There are red lines coming from where the tube was put in.  The area where the tube was put in is bleeding and the bleeding will not stop.  Your tube is hard to flush.  You do not get a blood return from the tube.  The tube gets loose or comes out.  The tube has a hole or a tear.  The tube leaks. Summary  A central line is a thin, flexible tube (catheter) that is put in your vein. It can be used to take blood for lab tests or to   give you medicine.  Follow instructions from your doctor about flushing and cleaning the tube.  Keep the area where the tube was put in clean and dry.  Ask your doctor what activities are safe for you. This information is not intended to replace advice given to you by your health care provider. Make sure you discuss any questions you have with your health care provider. Document Released: 11/24/2012 Document Revised: 12/25/2016 Document Reviewed: 12/25/2016 Elsevier Interactive Patient Education  2017 [employer]   Pegfilgrastim injection What is this medicine? PEGFILGRASTIM (PEG fil gra stim) is a long-acting granulocyte colony-stimulating factor that stimulates the growth of neutrophils, a type of white blood cell important in the body's  fight against infection. It is used to reduce the incidence of fever and infection in patients with certain types of cancer who are receiving chemotherapy that affects the bone marrow, and to increase survival after being exposed to high doses of radiation. This medicine may be used for other purposes; ask your health care provider or pharmacist if you have questions. COMMON BRAND NAME(S): Neulasta What should I tell my health care provider before I take this medicine? They need to know if you have any of these conditions: -kidney disease -latex allergy -ongoing radiation therapy -sickle cell disease -skin reactions to acrylic adhesives (On-Body Injector only) -an unusual or allergic reaction to pegfilgrastim, filgrastim, other medicines, foods, dyes, or preservatives -pregnant or trying to get pregnant -breast-feeding How should I use this medicine? This medicine is for injection under the skin. If you get this medicine at home, you will be taught how to prepare and give the pre-filled syringe or how to use the On-body Injector. Refer to the patient Instructions for Use for detailed instructions. Use exactly as directed. Tell your healthcare provider immediately if you suspect that the On-body Injector may not have performed as intended or if you suspect the use of the On-body Injector resulted in a missed or partial dose. It is important that you put your used needles and syringes in a special sharps container. Do not put them in a trash can. If you do not have a sharps container, call your pharmacist or healthcare provider to get one. Talk to your pediatrician regarding the use of this medicine in children. While this drug may be prescribed for selected conditions, precautions do apply. Overdosage: If you think you have taken too much of this medicine contact a poison control center or emergency room at once. NOTE: This medicine is only for you. Do not share this medicine with others. What if I  miss a dose? It is important not to miss your dose. Call your doctor or health care professional if you miss your dose. If you miss a dose due to an On-body Injector failure or leakage, a new dose should be administered as soon as possible using a single prefilled syringe for manual use. What may interact with this medicine? Interactions have not been studied. Give your health care provider a list of all the medicines, herbs, non-prescription drugs, or dietary supplements you use. Also tell them if you smoke, drink alcohol, or use illegal drugs. Some items may interact with your medicine. This list may not describe all possible interactions. Give your health care provider a list of all the medicines, herbs, non-prescription drugs, or dietary supplements you use. Also tell them if you smoke, drink alcohol, or use illegal drugs. Some items may interact with your medicine. What should I watch for while using this medicine?   You may need blood work done while you are taking this medicine. If you are going to need a MRI, CT scan, or other procedure, tell your doctor that you are using this medicine (On-Body Injector only). What side effects may I notice from receiving this medicine? Side effects that you should report to your doctor or health care professional as soon as possible: -allergic reactions like skin rash, itching or hives, swelling of the face, lips, or tongue -dizziness -fever -pain, redness, or irritation at site where injected -pinpoint red spots on the skin -red or dark-brown urine -shortness of breath or breathing problems -stomach or side pain, or pain at the shoulder -swelling -tiredness -trouble passing urine or change in the amount of urine Side effects that usually do not require medical attention (report to your doctor or health care professional if they continue or are bothersome): -bone pain -muscle pain This list may not describe all possible side effects. Call your doctor  for medical advice about side effects. You may report side effects to FDA at 1-800-FDA-1088. Where should I keep my medicine? Keep out of the reach of children. Store pre-filled syringes in a refrigerator between 2 and 8 degrees C (36 and 46 degrees F). Do not freeze. Keep in carton to protect from light. Throw away this medicine if it is left out of the refrigerator for more than 48 hours. Throw away any unused medicine after the expiration date. NOTE: This sheet is a summary. It may not cover all possible information. If you have questions about this medicine, talk to your doctor, pharmacist, or health care provider.  2018 Elsevier/[employer] Standard (2016-12-04 12:58:03)  

## 2018-05-26 ENCOUNTER — Other Ambulatory Visit (HOSPITAL_COMMUNITY)
Admission: RE | Admit: 2018-05-26 | Discharge: 2018-05-26 | Disposition: A | Discharge status: Home / Self Care | Payer: PPO | Source: Ambulatory Visit | Attending: Oncology | Admitting: Oncology

## 2018-05-26 DIAGNOSIS — C50919 Malignant neoplasm of unspecified site of unspecified female breast: Secondary | ICD-10-CM | POA: Insufficient documentation

## 2018-06-01 ENCOUNTER — Inpatient Hospital Stay: Payer: PPO

## 2018-06-01 ENCOUNTER — Encounter: Payer: Self-pay | Admitting: Adult Health

## 2018-06-01 ENCOUNTER — Ambulatory Visit: Payer: PPO

## 2018-06-01 ENCOUNTER — Telehealth: Payer: Self-pay | Admitting: Adult Health

## 2018-06-01 ENCOUNTER — Inpatient Hospital Stay (HOSPITAL_BASED_OUTPATIENT_CLINIC_OR_DEPARTMENT_OTHER): Payer: PPO | Admitting: Adult Health

## 2018-06-01 VITALS — BP 146/85 | HR 74 | Temp 98.4°F | Resp 18 | Ht 65.0 in | Wt 166.4 lb

## 2018-06-01 DIAGNOSIS — Z170 Estrogen receptor positive status [ER+]: Principal | ICD-10-CM

## 2018-06-01 DIAGNOSIS — Z923 Personal history of irradiation: Secondary | ICD-10-CM | POA: Diagnosis not present

## 2018-06-01 DIAGNOSIS — C50912 Malignant neoplasm of unspecified site of left female breast: Secondary | ICD-10-CM | POA: Diagnosis not present

## 2018-06-01 DIAGNOSIS — Z171 Estrogen receptor negative status [ER-]: Secondary | ICD-10-CM | POA: Diagnosis not present

## 2018-06-01 DIAGNOSIS — Z801 Family history of malignant neoplasm of trachea, bronchus and lung: Secondary | ICD-10-CM | POA: Diagnosis not present

## 2018-06-01 DIAGNOSIS — Z5111 Encounter for antineoplastic chemotherapy: Secondary | ICD-10-CM | POA: Diagnosis not present

## 2018-06-01 DIAGNOSIS — C50411 Malignant neoplasm of upper-outer quadrant of right female breast: Secondary | ICD-10-CM

## 2018-06-01 DIAGNOSIS — Z8041 Family history of malignant neoplasm of ovary: Secondary | ICD-10-CM | POA: Diagnosis not present

## 2018-06-01 DIAGNOSIS — G620 Drug-induced polyneuropathy: Secondary | ICD-10-CM

## 2018-06-01 DIAGNOSIS — T451X5A Adverse effect of antineoplastic and immunosuppressive drugs, initial encounter: Secondary | ICD-10-CM

## 2018-06-01 DIAGNOSIS — Z806 Family history of leukemia: Secondary | ICD-10-CM | POA: Diagnosis not present

## 2018-06-01 DIAGNOSIS — Z9013 Acquired absence of bilateral breasts and nipples: Secondary | ICD-10-CM | POA: Diagnosis not present

## 2018-06-01 DIAGNOSIS — Z95828 Presence of other vascular implants and grafts: Secondary | ICD-10-CM

## 2018-06-01 DIAGNOSIS — Z87891 Personal history of nicotine dependence: Secondary | ICD-10-CM | POA: Diagnosis not present

## 2018-06-01 LAB — CBC WITH DIFFERENTIAL (CANCER CENTER ONLY)
BASOS PCT: 1 %
Basophils Absolute: 0.2 10*3/uL — ABNORMAL HIGH (ref 0.0–0.1)
Eosinophils Absolute: 0.1 10*3/uL (ref 0.0–0.5)
Eosinophils Relative: 0 %
HEMATOCRIT: 33 % — AB (ref 34.8–46.6)
Hemoglobin: 11 g/dL — ABNORMAL LOW (ref 11.6–15.9)
LYMPHS PCT: 8 %
Lymphs Abs: 1.5 10*3/uL (ref 0.9–3.3)
MCH: 33.9 pg (ref 25.1–34.0)
MCHC: 33.5 g/dL (ref 31.5–36.0)
MCV: 101.1 fL — AB (ref 79.5–101.0)
Monocytes Absolute: 2.1 10*3/uL — ABNORMAL HIGH (ref 0.1–0.9)
Monocytes Relative: 10 %
NEUTROS ABS: 16 10*3/uL — AB (ref 1.5–6.5)
Neutrophils Relative %: 81 %
PLATELETS: 340 10*3/uL (ref 145–400)
RBC: 3.26 MIL/uL — AB (ref 3.70–5.45)
RDW: 21.8 % — ABNORMAL HIGH (ref 11.2–14.5)
WBC: 19.9 10*3/uL — AB (ref 3.9–10.3)

## 2018-06-01 LAB — CMP (CANCER CENTER ONLY)
ALT: 11 U/L (ref 0–55)
AST: 19 U/L (ref 5–34)
Albumin: 3.8 g/dL (ref 3.5–5.0)
Alkaline Phosphatase: 257 U/L — ABNORMAL HIGH (ref 40–150)
Anion gap: 8 (ref 3–11)
BUN: 7 mg/dL (ref 7–26)
CALCIUM: 9.8 mg/dL (ref 8.4–10.4)
CO2: 25 mmol/L (ref 22–29)
Chloride: 106 mmol/L (ref 98–109)
Creatinine: 0.66 mg/dL (ref 0.60–1.10)
GFR, Est AFR Am: >60 mL/min (ref >60)
GFR, Estimated: >60 mL/min (ref >60)
Glucose, Bld: 95 mg/dL (ref 70–140)
POTASSIUM: 4.1 mmol/L (ref 3.5–5.1)
SODIUM: 139 mmol/L (ref 136–145)
Total Bilirubin: 0.3 mg/dL (ref 0.2–1.2)
Total Protein: 6.2 g/dL — ABNORMAL LOW (ref 6.4–8.3)

## 2018-06-01 MED ORDER — HEPARIN SOD (PORK) LOCK FLUSH 100 UNIT/ML IV SOLN
500.0000 [IU] | Freq: Once | INTRAVENOUS | Status: AC | PRN
  Administered 2018-06-01: 500 [IU]
  Filled 2018-06-01: qty 5

## 2018-06-01 MED ORDER — SODIUM CHLORIDE 0.9% FLUSH
10.0000 mL | INTRAVENOUS | Status: DC | PRN
  Administered 2018-06-01: 10 mL
  Filled 2018-06-01: qty 10

## 2018-06-01 NOTE — Telephone Encounter (Signed)
Gave patient avs and calendar of upcoming June appointments.  °

## 2018-06-01 NOTE — Progress Notes (Addendum)
[place]  [phone] Fax:([phone]) [phone]     ID: Samantha Sullivan DOB: 17-Oct-1951  MR#: [id number]  UMP#:[id number]  Patient Care Team: Shon Baton, MD as PCP - General (Internal Medicine) Magrinat, Virgie Dad, MD as Consulting Physician (Oncology) Kem Boroughs, [place] as Nurse Practitioner (Family Medicine) Regal, Tamala Fothergill, DPM as Consulting Physician (Podiatry) Excell Seltzer, MD as Consulting Physician (General Surgery) Force, Shari Prows, DO as Referring Physician (Student) PCP: Shon Baton, MD OTHER MD:  CHIEF COMPLAINT: Contralateral breast cancer, triple negative  CURRENT TREATMENT: Adjuvant chemotherapy  INTERVAL HISTORY: Samantha Sullivan is here today for follow up and discussion of treatment planning for her triple negative breast cancer.  She has been receiving weekly Gemcitabine and Carboplatin for her triple negative breast cancer.  She has received this once.  Prior to that she received three cycles of Paclitaxel and Carboplatin, that was discontinued due to peripheral neuropathy.  She received Neulasta last week for her continued neutropenia.  Her counts are much improved today after a two week break.    REVIEW OF SYSTEMS: Samantha Sullivan is doing well today.  Her neuropathy is almost gone.  She is without vision changes, headaches, fever, or chills.  She has no chest pain, palpitations, shortness of breath, cough.  She denies abdominal issues such as nausea, vomiting, constipation, and diarrhea.  A detailed ROS was non contributory today.    RIGHT BREAST CANCER HISTORY:  From the original intake note:  "Samantha Sullivan"noted a lump in her right breast sometime in late October or early November, but there were "so many things going on in her life" she did not bring it to medical attention until she saw [name] 12/11/2015. Samantha Sullivan was able to palpate a mass at the 9:30 o'clock position in the right breast, which was mobile and nontender. The left breast is status post  prior remote biopsy and there was some scar tissue at the 7:00 position. The patient was set up for bilateral diagnostic Mammography with tomosynthesis at [facility] 12/27/2015. This found the breast density to be category C. There was a new oval mass in the right breast upper outer quadrant. Ultrasound the same day confirmed a 2.2 cm mass with irregular margins in the right breast at the 9:00 position read as highly suggestive of malignancy.  Biopsy of the right breast mass in question 01/02/2016 found ([id number]) an invasive ductal carcinoma, grade 1, estrogen receptor 100% positive, progesterone receptor 90% positive, both with strong staining intensity, with an MIB-1 of 10%, and no HER-2 amplification, the signals ratio being 1.32 and the number per cell 2.05. The patient was informed and instructed to stop hormone replacement therapy (last dose 01/04/2016).  Her subsequent history is as detailed below.    PAST MEDICAL HISTORY: Past Medical History:  Diagnosis Date  . Abnormal CT of the chest 12/06   numeroous lung nodules - most have resolved.  . Arthritis    psoriatic arthritis- toes & feet, but resolved at this point- no need meds at this point    . Cancer [facility])    right breast cancer  . Complication of anesthesia    /w colonoscopy- woke up before procedure was complete  . Family history of adverse reaction to anesthesia    pt's mother reported difficulty being given " enough " medicine to outcome a successful anesth. experience  . Family history of leukemia   . Family history of ovarian cancer   . History of meningioma 2007   in 2013 stable  on MRI and recheck in 5 years, no surgery necessry   . HSV infection 1970's- 1980's  . Psoriatic arthritis ([place])     PAST SURGICAL HISTORY: Past Surgical History:  Procedure Laterality Date  . BILATERAL TOTAL MASTECTOMY WITH AXILLARY LYMPH NODE DISSECTION Bilateral 01/04/2018   Procedure: BILATERAL TOTAL MASTECTOMY PROPHYLATIC RIGHT  WITH LEFT  SENTINEAL  LYMPH NODE;  Surgeon: Excell Seltzer, MD;  Location: [place];  Service: General;  Laterality: Bilateral;  . BREAST LUMPECTOMY WITH RADIOACTIVE SEED AND SENTINEL LYMPH NODE BIOPSY Right 01/22/2016   Procedure: RIGHT BREAST LUMPECTOMY WITH RADIOACTIVE SEED AND SENTINEL LYMPH NODE BIOPSY;  Surgeon: Excell Seltzer, MD;  Location: [place];  Service: General;  Laterality: Right;  . BREAST SURGERY Left 1971, 2015-03-11   lumpectomy benign, 03/11/15- Br. Ca- R lumpectomy & radiation   . COLONOSCOPY  8/06   normal and recheck in 10 years  . HAMMER TOE SURGERY Left 2015/03/11  . MASTECTOMY Right 01/04/2018   PROPHYLATIC   . MASTECTOMY COMPLETE / SIMPLE W/ SENTINEL NODE BIOPSY Left 01/04/2018  . PORTACATH PLACEMENT Right 01/04/2018   Procedure: INSERTION PORT-A-CATH;  Surgeon: Excell Seltzer, MD;  Location: [place];  Service: General;  Laterality: Right;  . THYROIDECTOMY, PARTIAL Left 1983    FAMILY HISTORY Family History  Problem Relation Age of Onset  . Osteoporosis Mother   . Osteoporosis Sister   . Lung cancer Maternal Aunt        smoker  . Heart Problems Maternal Grandmother   . COPD Maternal Grandfather   . Neurodegenerative disease Maternal Aunt        corical-ganglion degeneration  . Ovarian cancer Maternal Aunt        dx in her 33s  . Leukemia Cousin        55s-40s; maternal cousin  Samantha Sullivan has essentially no information on her father's side of the family aside from the fact that he died in his 57s from heart disease. He had psoriasis. The patient's mother passed away 03/10/2017 age 20, due to breast cancer. The patient had 2 brothers who died from congenital heart disease shortly after birth. The patient has one sister. The patient's mother had one sister, the patient's aunt, diagnosed with ovarian cancer in her 59s.  GYNECOLOGIC HISTORY:  Patient's last menstrual period was 03/22/2000 (approximate). Menarche age 75, first live birth age 88. The patient is GX P2. She  went through menopause approximately March 10, 1989 but was taking hormone replacement until 01/04/2016. Since coming off replacement she has had insomnia but no hot flashes or vaginal dryness pleural bones so 4. 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 She is divorced and lives alone, with no pets. Her daughter Samantha Sullivan lives in [place]. She is disabled secondary to schizoaffective disorder. Daughter Samantha Sullivan lives in [place] and she is a Estate agent but mostly a homemaker. The patient has 3 grandchildren, the older 66, the other 2 being under 69 years old. The patient attends [employer]. Her mother passed away in 03/10/2017 due to breast cancer.    ADVANCED DIRECTIVES: her daughter Samantha Sullivan is her healthcare power of attorney. She can be reached at [place]: Social History   Tobacco Use  . Smoking status: Former Smoker    Packs/day: 1.00    Years: 12.00    Pack years:  12.00    Types: Cigarettes    Last attempt to quit: 07/23/1981    Years since quitting: 36.8  . Smokeless tobacco: Never Used  Substance Use Topics  . Alcohol use: Yes    Alcohol/week: 0.0 oz    Comment: social occasion only- special   . Drug use: No     Colonoscopy: ; [facility]  QPR:FFMBWGY 2016/[place]  Bone density:rJanuary 2018/normal   Lipid panel:  Allergies  Allergen Reactions  . Bee Venom   . Ciprofloxacin   . Levaquin [Levofloxacin In D5w]   . Norco [Hydrocodone-Acetaminophen] Other (See Comments)    Pt states Norco made her extremely hyper after her surgery and she was not able to sleep.    Current Outpatient Medications  Medication Sig Dispense Refill  . folic acid (FOLVITE) 1 MG tablet Take 2 mg by mouth every evening.    . lidocaine-prilocaine (EMLA) cream Apply to affected area once 30 g 3    . LORazepam (ATIVAN) 0.5 MG tablet Take 1 tablet (0.5 mg total) by mouth at bedtime as needed and may repeat dose one time if needed (Nausea or vomiting). 10 tablet 0  . Polyethyl Glycol-Propyl Glycol (LUBRICANT EYE DROPS) 0.4-0.3 % SOLN Place 1-2 drops into both eyes 3 (three) times daily as needed (dry eyes/irritated eyes.).    Marland Kitchen prochlorperazine (COMPAZINE) 10 MG tablet Take 1 tablet (10 mg total) by mouth every 6 (six) hours as needed (Nausea or vomiting). 30 tablet 1   Current Facility-Administered Medications  Medication Dose Route Frequency Provider Last Rate Last Dose  . triamcinolone acetonide (KENALOG) 10 MG/ML injection 10 mg  10 mg Other Once Wallene Huh, DPM        OBJECTIVE:  Vitals:   06/01/18 0908  BP: (!) 146/85  Sullivan: 74  Resp: 18  Temp: 98.4 F (36.9 C)  SpO2: 100%     Body mass index is 27.69 kg/m.   ECOG FS:1 - Symptomatic but completely ambulatory GENERAL: Patient is a well appearing female in no acute distress HEENT:  Sclerae anicteric.  Oropharynx clear and moist. No ulcerations or evidence of oropharyngeal candidiasis. Neck is supple.  NODES:  No cervical, supraclavicular, or axillary lymphadenopathy palpated.  BREAST EXAM:  Deferred. LUNGS:  Clear to auscultation bilaterally.  No wheezes or rhonchi. HEART:  Regular rate and rhythm. No murmur appreciated. ABDOMEN:  Soft, nontender.  Positive, normoactive bowel sounds. No organomegaly palpated. MSK:  No focal spinal tenderness to palpation. Full range of motion bilaterally in the upper extremities. EXTREMITIES:  No peripheral edema.   SKIN:  Clear with no obvious rashes or skin changes.  NEURO:  Nonfocal. Well oriented.  Appropriate affect.     LAB RESULTS:  CMP     Component Value Date/Time   NA 139 06/01/2018 0809   NA 141 08/05/2017 1335   K 4.1 06/01/2018 0809   K 4.5 08/05/2017 1335   CL 106 06/01/2018 0809   CO2 25 06/01/2018 0809   CO2 29 08/05/2017 1335   GLUCOSE 95 06/01/2018  0809   GLUCOSE 77 08/05/2017 1335   BUN 7 06/01/2018 0809   BUN 13.4 08/05/2017 1335   CREATININE 0.66 06/01/2018 0809   CREATININE 0.8 08/05/2017 1335   CALCIUM 9.8 06/01/2018 0809   CALCIUM 10.0 08/05/2017 1335   PROT 6.2 (L) 06/01/2018 0809   PROT 6.8 08/05/2017 1335   ALBUMIN 3.8 06/01/2018 0809   ALBUMIN 3.6 08/05/2017 1335   AST 19 06/01/2018 0809   AST 19  08/05/2017 1335   ALT 11 06/01/2018 0809   ALT 12 08/05/2017 1335   ALKPHOS 257 (H) 06/01/2018 0809   ALKPHOS 169 (H) 08/05/2017 1335   BILITOT 0.3 06/01/2018 0809   BILITOT 0.63 08/05/2017 1335   GFRNONAA >60 06/01/2018 0809   GFRAA >60 06/01/2018 0809    INo results found for: SPEP, UPEP  Lab Results  Component Value Date   WBC 19.9 (H) 06/01/2018   NEUTROABS 16.0 (H) 06/01/2018   HGB 11.0 (L) 06/01/2018   HCT 33.0 (L) 06/01/2018   MCV 101.1 (H) 06/01/2018   PLT 340 06/01/2018      Chemistry      Component Value Date/Time   NA 139 06/01/2018 0809   NA 141 08/05/2017 1335   K 4.1 06/01/2018 0809   K 4.5 08/05/2017 1335   CL 106 06/01/2018 0809   CO2 25 06/01/2018 0809   CO2 29 08/05/2017 1335   BUN 7 06/01/2018 0809   BUN 13.4 08/05/2017 1335   CREATININE 0.66 06/01/2018 0809   CREATININE 0.8 08/05/2017 1335      Component Value Date/Time   CALCIUM 9.8 06/01/2018 0809   CALCIUM 10.0 08/05/2017 1335   ALKPHOS 257 (H) 06/01/2018 0809   ALKPHOS 169 (H) 08/05/2017 1335   AST 19 06/01/2018 0809   AST 19 08/05/2017 1335   ALT 11 06/01/2018 0809   ALT 12 08/05/2017 1335   BILITOT 0.3 06/01/2018 0809   BILITOT 0.63 08/05/2017 1335       No results found for: LABCA2  No components found for: LABCA125  No results for input(s): INR in the last 168 hours.  Urinalysis    Component Value Date/Time   BILIRUBINUR neg 12/11/2015 0925   PROTEINUR neg 12/11/2015 0925   UROBILINOGEN negative 12/11/2015 0925   NITRITE neg 12/11/2015 0925   LEUKOCYTESUR Negative 12/11/2015 0925    STUDIES: No  results found.  RESEARCH: enrolled in UPBEAT study; not a candidate for BR 003 because of concurrent cancer  ASSESSMENT: 67 y.o. [place] woman status post right breast upper outer quadrant biopsy 01/02/2016 for a clinical T2 N0, stage IIA invasive ductal carcinoma, grade 1, estrogen and progesterone receptor positive, HER-2 not amplified, with an MIB-1 of 10%.  (1). Right lumpectomy and sentinel lymph node sampling 01/22/2016 showed a pT1c pN0, stage IA invasive ductal carcinoma, grade 1, with close but negative margins. Repeat HER-2 testing was again not amplified   (3) Oncotype score of 18 predicts a risk of recurrence outside the breast within the next 10 years of 11% if the patient's only systemic therapy is tamoxifen for 5 years. It also predicts no significant benefit from chemotherapy.     (4) adjuvant radiation3/14/17-4/11/17 Site/dose:   Right breast - 42.72 gy at 2.67 Gy per fraction x 16 fractions followed by an electron boost to the right breast with 10 Gy at 2 Gy per fraction x 5 fractions.    (5)  Anastrozole started 04/21/2016, discontinued after approximately 1 month with significant side effects  (a)  [facility] density at [facility] September 2013 was normal  (b) bone density January 2018 was normal  (c) anastrozole restarted February 2018, held again at the start of chemotherapy February 2019  (6) genetics testing  02/24/2016 through the Breast/Ovarian cancer gene panel offered by [facility] found no deleterious mutations in ATM, BARD1, BRCA1, BRCA2, BRIP1, CDH1, CHEK2, EPCAM, FANCC, MLH1, MSH2, MSH6, NBN, PALB2, PMS2, PTEN, RAD51C, RAD51D, TP53, and XRCC2.   (7) left breast biopsy 12/17/2017  showed a cT2 cN0, stage IIA- IIB invasive ductal carcinoma, grade 2, estrogen receptor weakly positive, progesterone receptor negative, with HER-2 equivocal by FISH, negative by immunohistochemistry  (8) status post bilateral mastectomies and left sentinel lymph node sampling 01/04/2018  showing  (a) on the right, no evidence of disease  (b) on the left, a pT2 pN0, stage IIB invasive ductal carcinoma, grade 3, triple negative  (9) adjuvant chemotherapy consisting of cyclophosphamide and doxorubicin in dose dense fashion x4 started 02/16/2018, completed 03/30/2018,  followed by weekly paclitaxel /carboplatin x12 starting 04/13/2018; Paclitaxel/Carboplatin stopped after 3 cycles due to peripheral neuropathy.  Gemcitabine Carboplatin started on 5/21, but held x 3 weeks due to prolonged neutropenia requiring both Granix and Neulasta.      PLAN:  Samantha Sullivan is doing well today. She met with myself and Dr. Jana Hakim about her plan.  Her plan is this: Since her bone marrow has recovered, and her labs are much improved, she will return next week for Gemcitabine and Carboplatin.  She will receive this and have 3 Granix injections following the treatment on 6/19, 6/20, and 6/21.  She will then be seen the following week and at that point we check her counts. If adequate, we will treat. If on the low side, we will treat and give her neulasta day 2, waiting 2 weeks for next treatment. If on higher side, will treat and repeat granix as before, setting her up for a third treatmentOur goal is to hopefully get at least 8 treatments all together in.    We will see her back in one week for labs, f/u, and Gemcitabine/Carboplatin. She knows to call for any other problems that may develop before the next visit.    Wilber Bihari, NP  06/01/18 10:11 AM Medical Oncology and Hematology [facility] [address] [place], [place] [address] Tel. [phone]    Fax. [phone]  ADDENDUM:  I discussed the situation with Samantha Sullivan.  She really looks great and I think she could tolerate a little bit more chemo, the question of course being her counts.  In any case since she received Neulasta only 7 days ago we have to wait a week for her next dose.  I am also going to slightly drop the doses  She  will return on 06/08/2018 for treatment and then receive 3 doses of Granix.  She will return on 06/15/2018 and assuming her counts are adequate she will then be treated again.  At that point we will decide whether or not to repeat the Granix, and treat again the following week or try Neulasta and treat again 2 weeks after the 06/15/2018 dose.  We will try to continue this pattern until she has completed her additional 8 doses of chemo planned     I personally saw this patient and performed a substantive portion of this encounter with the listed APP documented above.   Chauncey Cruel, MD Medical Oncology and Hematology [facility] [address] [place], [place] [address] Tel. [phone]    Fax. [phone]

## 2018-06-08 ENCOUNTER — Inpatient Hospital Stay: Payer: PPO

## 2018-06-08 ENCOUNTER — Encounter: Payer: Self-pay | Admitting: Adult Health

## 2018-06-08 ENCOUNTER — Inpatient Hospital Stay (HOSPITAL_BASED_OUTPATIENT_CLINIC_OR_DEPARTMENT_OTHER): Payer: PPO | Admitting: Adult Health

## 2018-06-08 VITALS — BP 143/84 | HR 73 | Temp 98.4°F | Resp 18 | Ht 65.0 in | Wt 168.6 lb

## 2018-06-08 DIAGNOSIS — Z801 Family history of malignant neoplasm of trachea, bronchus and lung: Secondary | ICD-10-CM

## 2018-06-08 DIAGNOSIS — C50411 Malignant neoplasm of upper-outer quadrant of right female breast: Secondary | ICD-10-CM

## 2018-06-08 DIAGNOSIS — Z9013 Acquired absence of bilateral breasts and nipples: Secondary | ICD-10-CM

## 2018-06-08 DIAGNOSIS — C50812 Malignant neoplasm of overlapping sites of left female breast: Secondary | ICD-10-CM

## 2018-06-08 DIAGNOSIS — Z87891 Personal history of nicotine dependence: Secondary | ICD-10-CM

## 2018-06-08 DIAGNOSIS — Z170 Estrogen receptor positive status [ER+]: Secondary | ICD-10-CM

## 2018-06-08 DIAGNOSIS — C50912 Malignant neoplasm of unspecified site of left female breast: Secondary | ICD-10-CM | POA: Diagnosis not present

## 2018-06-08 DIAGNOSIS — Z923 Personal history of irradiation: Secondary | ICD-10-CM | POA: Diagnosis not present

## 2018-06-08 DIAGNOSIS — Z5111 Encounter for antineoplastic chemotherapy: Secondary | ICD-10-CM | POA: Diagnosis not present

## 2018-06-08 DIAGNOSIS — Z95828 Presence of other vascular implants and grafts: Secondary | ICD-10-CM

## 2018-06-08 DIAGNOSIS — Z171 Estrogen receptor negative status [ER-]: Secondary | ICD-10-CM

## 2018-06-08 DIAGNOSIS — Z806 Family history of leukemia: Secondary | ICD-10-CM

## 2018-06-08 DIAGNOSIS — Z8041 Family history of malignant neoplasm of ovary: Secondary | ICD-10-CM | POA: Diagnosis not present

## 2018-06-08 LAB — CBC WITH DIFFERENTIAL (CANCER CENTER ONLY)
BASOS ABS: 0 10*3/uL (ref 0.0–0.1)
BASOS PCT: 0 %
EOS ABS: 0 10*3/uL (ref 0.0–0.5)
Eosinophils Relative: 1 %
HEMATOCRIT: 33.4 % — AB (ref 34.8–46.6)
HEMOGLOBIN: 11.1 g/dL — AB (ref 11.6–15.9)
Lymphocytes Relative: 20 %
Lymphs Abs: 1.2 10*3/uL (ref 0.9–3.3)
MCH: 33.1 pg (ref 25.1–34.0)
MCHC: 33.1 g/dL (ref 31.5–36.0)
MCV: 100 fL (ref 79.5–101.0)
MONOS PCT: 12 %
Monocytes Absolute: 0.7 10*3/uL (ref 0.1–0.9)
NEUTROS ABS: 4 10*3/uL (ref 1.5–6.5)
NEUTROS PCT: 67 %
Platelet Count: 224 10*3/uL (ref 145–400)
RBC: 3.34 MIL/uL — AB (ref 3.70–5.45)
RDW: 19.7 % — ABNORMAL HIGH (ref 11.2–14.5)
WBC Count: 5.8 10*3/uL (ref 3.9–10.3)

## 2018-06-08 LAB — CMP (CANCER CENTER ONLY)
ALK PHOS: 197 U/L — AB (ref 40–150)
ALT: 7 U/L (ref 0–55)
ANION GAP: 8 (ref 3–11)
AST: 16 U/L (ref 5–34)
Albumin: 3.7 g/dL (ref 3.5–5.0)
BUN: 16 mg/dL (ref 7–26)
CALCIUM: 9.6 mg/dL (ref 8.4–10.4)
CO2: 26 mmol/L (ref 22–29)
Chloride: 106 mmol/L (ref 98–109)
Creatinine: 0.67 mg/dL (ref 0.60–1.10)
Glucose, Bld: 95 mg/dL (ref 70–140)
Potassium: 4 mmol/L (ref 3.5–5.1)
SODIUM: 140 mmol/L (ref 136–145)
Total Bilirubin: 0.3 mg/dL (ref 0.2–1.2)
Total Protein: 6.3 g/dL — ABNORMAL LOW (ref 6.4–8.3)

## 2018-06-08 MED ORDER — HEPARIN SOD (PORK) LOCK FLUSH 100 UNIT/ML IV SOLN
500.0000 [IU] | Freq: Once | INTRAVENOUS | Status: AC | PRN
  Administered 2018-06-08: 500 [IU]
  Filled 2018-06-08: qty 5

## 2018-06-08 MED ORDER — FAMOTIDINE IN NACL 20-0.9 MG/50ML-% IV SOLN
20.0000 mg | Freq: Once | INTRAVENOUS | Status: AC
  Administered 2018-06-08: 20 mg via INTRAVENOUS

## 2018-06-08 MED ORDER — SODIUM CHLORIDE 0.9% FLUSH
10.0000 mL | INTRAVENOUS | Status: DC | PRN
  Administered 2018-06-08: 10 mL
  Filled 2018-06-08: qty 10

## 2018-06-08 MED ORDER — SODIUM CHLORIDE 0.9 % IV SOLN
153.7600 mg | Freq: Once | INTRAVENOUS | Status: AC
  Administered 2018-06-08: 150 mg via INTRAVENOUS
  Filled 2018-06-08: qty 15

## 2018-06-08 MED ORDER — SODIUM CHLORIDE 0.9 % IV SOLN
640.0000 mg/m2 | Freq: Once | INTRAVENOUS | Status: AC
  Administered 2018-06-08: 1254 mg via INTRAVENOUS
  Filled 2018-06-08: qty 32.98

## 2018-06-08 MED ORDER — DIPHENHYDRAMINE HCL 50 MG/ML IJ SOLN
25.0000 mg | Freq: Once | INTRAMUSCULAR | Status: AC
  Administered 2018-06-08: 25 mg via INTRAVENOUS

## 2018-06-08 MED ORDER — SODIUM CHLORIDE 0.9 % IV SOLN
Freq: Once | INTRAVENOUS | Status: AC
  Administered 2018-06-08: 09:00:00 via INTRAVENOUS

## 2018-06-08 MED ORDER — PALONOSETRON HCL INJECTION 0.25 MG/5ML
0.2500 mg | Freq: Once | INTRAVENOUS | Status: AC
  Administered 2018-06-08: 0.25 mg via INTRAVENOUS

## 2018-06-08 MED ORDER — DEXAMETHASONE SODIUM PHOSPHATE 10 MG/ML IJ SOLN
4.0000 mg | Freq: Once | INTRAMUSCULAR | Status: AC
  Administered 2018-06-08: 4 mg via INTRAVENOUS

## 2018-06-08 NOTE — Progress Notes (Signed)
[place]  [phone] [phone]     ID: [name] DOB: 1951-09-21  MR#: [id number]  ELF#:[id number]  Patient Care Team: Shon Baton, MD as PCP - General (Internal Medicine) Magrinat, Virgie Dad, MD as Consulting Physician (Oncology) Kem Boroughs, Valentine as Nurse Practitioner (Family Medicine) Regal, Tamala Fothergill, DPM as Consulting Physician (Podiatry) Excell Seltzer, MD as Consulting Physician (General Surgery) Force, Shari Prows, DO as Referring Physician (Student) PCP: Shon Baton, MD OTHER MD:  CHIEF COMPLAINT: Contralateral breast cancer, triple negative  CURRENT TREATMENT: Adjuvant chemotherapy  INTERVAL HISTORY: Samantha Sullivan is here today for follow up and discussion of treatment planning for her triple negative breast cancer.  She has been receiving weekly Gemcitabine and Carboplatin for her triple negative breast cancer.  She has received this once.  Prior to that she received three cycles of Paclitaxel and Carboplatin, that was discontinued due to peripheral neuropathy.   REVIEW OF SYSTEMS: Samantha Sullivan is doing well today.  She continues to have mild residual peripheral neuropathy.  Her nails have turned brown now, her nails feel loose.  She has some mild pain in her nail bed.  [name] planted flowers this weekend, and says she should have put on gloves because she had a difficult time cleaning her nails.  She is not neutropenic today.  She denies any issues such as fevers, chills, nausea, vomiting, constipation, diarrhea, rash, or any other concerns.  A detailed ROS was otherwise non contributory.    RIGHT BREAST CANCER HISTORY:  From the original intake note:  "[name]"noted a lump in her right breast sometime in late October or early November, but there were "so many things going on in her life" she did not bring it to medical attention until she saw [name] 12/11/2015. [name] was able to palpate a mass at the 9:30 o'clock position in the right  breast, which was mobile and nontender. The left breast is status post prior remote biopsy and there was some scar tissue at the 7:00 position. The patient was set up for bilateral diagnostic Mammography with tomosynthesis at [facility] 12/27/2015. This found the breast density to be category C. There was a new oval mass in the right breast upper outer quadrant. Ultrasound the same day confirmed a 2.2 cm mass with irregular margins in the right breast at the 9:00 position read as highly suggestive of malignancy.  Biopsy of the right breast mass in question 01/02/2016 found ([id number]) an invasive ductal carcinoma, grade 1, estrogen receptor 100% positive, progesterone receptor 90% positive, both with strong staining intensity, with an MIB-1 of 10%, and no HER-2 amplification, the signals ratio being 1.32 and the number per cell 2.05. The patient was informed and instructed to stop hormone replacement therapy (last dose 01/04/2016).  Her subsequent history is as detailed below.    PAST MEDICAL HISTORY: Past Medical History:  Diagnosis Date  . Abnormal CT of the chest 12/06   numeroous lung nodules - most have resolved.  . Arthritis    psoriatic arthritis- toes & feet, but resolved at this point- no need meds at this point    . Cancer [facility])    right breast cancer  . Complication of anesthesia    /w colonoscopy- woke up before procedure was complete  . Family history of adverse reaction to anesthesia    pt's mother reported difficulty being given " enough " medicine to outcome a successful anesth. experience  . Family history of leukemia   . Family history  of ovarian cancer   . History of meningioma March 22, 2006   in Mar 22, 2012 stable on MRI and recheck in 5 years, no surgery necessry   . HSV infection 1970's- 1980's  . Psoriatic arthritis ([place])     PAST SURGICAL HISTORY: Past Surgical History:  Procedure Laterality Date  . BILATERAL TOTAL MASTECTOMY WITH AXILLARY LYMPH NODE DISSECTION Bilateral 01/04/2018     Procedure: BILATERAL TOTAL MASTECTOMY PROPHYLATIC RIGHT  WITH LEFT SENTINEAL  LYMPH NODE;  Surgeon: Excell Seltzer, MD;  Location: [place];  Service: General;  Laterality: Bilateral;  . BREAST LUMPECTOMY WITH RADIOACTIVE SEED AND SENTINEL LYMPH NODE BIOPSY Right 01/22/2016   Procedure: RIGHT BREAST LUMPECTOMY WITH RADIOACTIVE SEED AND SENTINEL LYMPH NODE BIOPSY;  Surgeon: Excell Seltzer, MD;  Location: [place];  Service: General;  Laterality: Right;  . BREAST SURGERY Left 1971, 2015/03/23   lumpectomy benign, 23-Mar-2015- [facility]- R lumpectomy & radiation   . COLONOSCOPY  8/06   normal and recheck in 10 years  . HAMMER TOE SURGERY Left 03/23/15  . MASTECTOMY Right 01/04/2018   PROPHYLATIC   . MASTECTOMY COMPLETE / SIMPLE W/ SENTINEL NODE BIOPSY Left 01/04/2018  . PORTACATH PLACEMENT Right 01/04/2018   Procedure: INSERTION PORT-A-CATH;  Surgeon: Excell Seltzer, MD;  Location: [place];  Service: General;  Laterality: Right;  . THYROIDECTOMY, PARTIAL Left 1983    FAMILY HISTORY Family History  Problem Relation Age of Onset  . Osteoporosis Mother   . Osteoporosis Sister   . Lung cancer Maternal Aunt        smoker  . Heart Problems Maternal Grandmother   . COPD Maternal Grandfather   . Neurodegenerative disease Maternal Aunt        corical-ganglion degeneration  . Ovarian cancer Maternal Aunt        dx in her 35s  . Leukemia Cousin        48s-40s; maternal cousin  [name] has essentially no information on her father's side of the family aside from the fact that he died in his 63s from heart disease. He had psoriasis. The patient's mother passed away 03/22/2017 age 42, due to breast cancer. The patient had 2 brothers who died from congenital heart disease shortly after birth. The patient has one sister. The patient's mother had one sister, the patient's aunt, diagnosed with ovarian cancer in her 69s.  GYNECOLOGIC HISTORY:  Patient's last menstrual period was 03/22/2000  (approximate). Menarche age 62, first live birth age 37. The patient is GX P2. She went through menopause approximately 03-22-1989 but was taking hormone replacement until 01/04/2016. Since coming off replacement she has had insomnia but no hot flashes or vaginal dryness pleural bones so 4. 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 She is divorced and lives alone, with no pets. Her daughter [name] lives in [place]. She is disabled secondary to schizoaffective disorder. Daughter [name] lives in [place] and she is a Estate agent but mostly a homemaker. The patient has 3 grandchildren, the older 45, the other 2 being under 48 years old. The patient attends [employer]. Her mother passed away in 03/22/2017 due to breast cancer.    ADVANCED DIRECTIVES: her daughter [name] is her healthcare power of attorney. She can be reached at [place]: Social History   Tobacco Use  . Smoking status: Former  Smoker    Packs/day: 1.00    Years: 12.00    Pack years: 12.00    Types: Cigarettes    Last attempt to quit: 07/23/1981    Years since quitting: 36.9  . Smokeless tobacco: Never Used  Substance Use Topics  . Alcohol use: Yes    Alcohol/week: 0.0 oz    Comment: social occasion only- special   . Drug use: No     Colonoscopy: ; [facility]  WUJ:WJXBJYN 2016/[facility]  Bone density:rJanuary 2018/normal   Lipid panel:  Allergies  Allergen Reactions  . Bee Venom   . Ciprofloxacin   . Levaquin [Levofloxacin In D5w]   . Norco [Hydrocodone-Acetaminophen] Other (See Comments)    Pt states Norco made her extremely hyper after her surgery and she was not able to sleep.    Current Outpatient Medications  Medication Sig Dispense Refill  . folic acid (FOLVITE) 1 MG tablet Take 2 mg by mouth every  evening.    . lidocaine-prilocaine (EMLA) cream Apply to affected area once 30 g 3  . LORazepam (ATIVAN) 0.5 MG tablet Take 1 tablet (0.5 mg total) by mouth at bedtime as needed and may repeat dose one time if needed (Nausea or vomiting). 10 tablet 0  . Polyethyl Glycol-Propyl Glycol (LUBRICANT EYE DROPS) 0.4-0.3 % SOLN Place 1-2 drops into both eyes 3 (three) times daily as needed (dry eyes/irritated eyes.).    Marland Kitchen prochlorperazine (COMPAZINE) 10 MG tablet Take 1 tablet (10 mg total) by mouth every 6 (six) hours as needed (Nausea or vomiting). 30 tablet 1   Current Facility-Administered Medications  Medication Dose Route Frequency Provider Last Rate Last Dose  . triamcinolone acetonide (KENALOG) 10 MG/ML injection 10 mg  10 mg Other Once Wallene Huh, DPM        OBJECTIVE:  Vitals:   06/08/18 0808  BP: (!) 143/84  Sullivan: 73  Resp: 18  Temp: 98.4 F (36.9 C)  SpO2: 99%     Body mass index is 28.06 kg/m.   ECOG FS:1 - Symptomatic but completely ambulatory GENERAL: Patient is a well appearing female in no acute distress HEENT:  Sclerae anicteric.  Oropharynx clear and moist. No ulcerations or evidence of oropharyngeal candidiasis. Neck is supple.  NODES:  No cervical, supraclavicular, or axillary lymphadenopathy palpated.  BREAST EXAM:  Deferred. LUNGS:  Clear to auscultation bilaterally.  No wheezes or rhonchi. HEART:  Regular rate and rhythm. No murmur appreciated. ABDOMEN:  Soft, nontender.  Positive, normoactive bowel sounds. No organomegaly palpated. MSK:  No focal spinal tenderness to palpation. Full range of motion bilaterally in the upper extremities. EXTREMITIES:  No peripheral edema.   SKIN:  Clear with no obvious rashes or skin changes. + nail dyscrasia NEURO:  Nonfocal. Well oriented.  Appropriate affect.     LAB RESULTS:  CMP     Component Value Date/Time   NA 139 06/01/2018 0809   NA 141 08/05/2017 1335   K 4.1 06/01/2018 0809   K 4.5 08/05/2017 1335   CL  106 06/01/2018 0809   CO2 25 06/01/2018 0809   CO2 29 08/05/2017 1335   GLUCOSE 95 06/01/2018 0809   GLUCOSE 77 08/05/2017 1335   BUN 7 06/01/2018 0809   BUN 13.4 08/05/2017 1335   CREATININE 0.66 06/01/2018 0809   CREATININE 0.8 08/05/2017 1335   CALCIUM 9.8 06/01/2018 0809   CALCIUM 10.0 08/05/2017 1335   PROT 6.2 (L) 06/01/2018 0809   PROT 6.8 08/05/2017 1335   ALBUMIN 3.8 06/01/2018  0809   ALBUMIN 3.6 08/05/2017 1335   AST 19 06/01/2018 0809   AST 19 08/05/2017 1335   ALT 11 06/01/2018 0809   ALT 12 08/05/2017 1335   ALKPHOS 257 (H) 06/01/2018 0809   ALKPHOS 169 (H) 08/05/2017 1335   BILITOT 0.3 06/01/2018 0809   BILITOT 0.63 08/05/2017 1335   GFRNONAA >60 06/01/2018 0809   GFRAA >60 06/01/2018 0809    INo results found for: SPEP, UPEP  Lab Results  Component Value Date   WBC 5.8 06/08/2018   NEUTROABS 4.0 06/08/2018   HGB 11.1 (L) 06/08/2018   HCT 33.4 (L) 06/08/2018   MCV 100.0 06/08/2018   PLT 224 06/08/2018      Chemistry      Component Value Date/Time   NA 139 06/01/2018 0809   NA 141 08/05/2017 1335   K 4.1 06/01/2018 0809   K 4.5 08/05/2017 1335   CL 106 06/01/2018 0809   CO2 25 06/01/2018 0809   CO2 29 08/05/2017 1335   BUN 7 06/01/2018 0809   BUN 13.4 08/05/2017 1335   CREATININE 0.66 06/01/2018 0809   CREATININE 0.8 08/05/2017 1335      Component Value Date/Time   CALCIUM 9.8 06/01/2018 0809   CALCIUM 10.0 08/05/2017 1335   ALKPHOS 257 (H) 06/01/2018 0809   ALKPHOS 169 (H) 08/05/2017 1335   AST 19 06/01/2018 0809   AST 19 08/05/2017 1335   ALT 11 06/01/2018 0809   ALT 12 08/05/2017 1335   BILITOT 0.3 06/01/2018 0809   BILITOT 0.63 08/05/2017 1335       No results found for: LABCA2  No components found for: LABCA125  No results for input(s): INR in the last 168 hours.  Urinalysis    Component Value Date/Time   BILIRUBINUR neg 12/11/2015 0925   PROTEINUR neg 12/11/2015 0925   UROBILINOGEN negative 12/11/2015 0925   NITRITE  neg 12/11/2015 0925   LEUKOCYTESUR Negative 12/11/2015 0925    STUDIES: No results found.  RESEARCH: enrolled in UPBEAT study; not a candidate for BR 003 because of concurrent cancer  ASSESSMENT: 67 y.o. [place] woman status post right breast upper outer quadrant biopsy 01/02/2016 for a clinical T2 N0, stage IIA invasive ductal carcinoma, grade 1, estrogen and progesterone receptor positive, HER-2 not amplified, with an MIB-1 of 10%.  (1). Right lumpectomy and sentinel lymph node sampling 01/22/2016 showed a pT1c pN0, stage IA invasive ductal carcinoma, grade 1, with close but negative margins. Repeat HER-2 testing was again not amplified   (3) Oncotype score of 18 predicts a risk of recurrence outside the breast within the next 10 years of 11% if the patient's only systemic therapy is tamoxifen for 5 years. It also predicts no significant benefit from chemotherapy.     (4) adjuvant radiation3/14/17-4/11/17 Site/dose:   Right breast - 42.72 gy at 2.67 Gy per fraction x 16 fractions followed by an electron boost to the right breast with 10 Gy at 2 Gy per fraction x 5 fractions.    (5)  Anastrozole started 04/21/2016, discontinued after approximately 1 month with significant side effects  (a)  Bone density at [facility] September 2013 was normal  (b) bone density January 2018 was normal  (c) anastrozole restarted February 2018, held again at the start of chemotherapy February 2019  (6) genetics testing  02/24/2016 through the Breast/Ovarian cancer gene panel offered by [facility] found no deleterious mutations in ATM, BARD1, BRCA1, BRCA2, BRIP1, CDH1, CHEK2, EPCAM, FANCC, MLH1, MSH2, MSH6, NBN, PALB2,  PMS2, PTEN, RAD51C, RAD51D, TP53, and XRCC2.   (7) left breast biopsy 12/17/2017 showed a cT2 cN0, stage IIA- IIB invasive ductal carcinoma, grade 2, estrogen receptor weakly positive, progesterone receptor negative, with HER-2 equivocal by FISH, negative by immunohistochemistry  (8)  status post bilateral mastectomies and left sentinel lymph node sampling 01/04/2018 showing  (a) on the right, no evidence of disease  (b) on the left, a pT2 pN0, stage IIB invasive ductal carcinoma, grade 3, triple negative  (9) adjuvant chemotherapy consisting of cyclophosphamide and doxorubicin in dose dense fashion x4 started 02/16/2018, completed 03/30/2018,  followed by weekly paclitaxel /carboplatin x12 starting 04/13/2018; Paclitaxel/Carboplatin stopped after 3 cycles due to peripheral neuropathy.  Gemcitabine Carboplatin started on 5/21, but held x 3 weeks due to prolonged neutropenia requiring both Granix and Neulasta.      PLAN:  Samantha Sullivan is doing well today.  Her CBC is improved and her WBC are normal.  She will proceed with chemotherapy today with Gemcitabine and Carboplatin.  She will return tomorrow, Thursday, and Friday for Granix injections.  We will decide next week based on her labs if she will need three more Granix injections or Neulasta and a week off.  Samantha Sullivan understands this plan.    We will see her back in one week labs and evaluation prior to her treatment. She knows to call for any other problems that may develop before the next visit.    A total of (20) minutes of face-to-face time was spent with this patient with greater than 50% of that time in counseling and care-coordination.   Wilber Bihari, NP  06/08/18 8:25 AM Medical Oncology and Hematology [facility] [address] [place], [place] [address] Tel. [phone]    Fax. [phone]

## 2018-06-08 NOTE — Patient Instructions (Addendum)
[facility] Discharge Instructions for Patients Receiving Chemotherapy  Today you received the following chemotherapy agents Gemzar and Carboplatin   To help prevent nausea and vomiting after your treatment, we encourage you to take your nausea medication as directed.    If you develop nausea and vomiting that is not controlled by your nausea medication, call the clinic.   BELOW ARE SYMPTOMS THAT SHOULD BE REPORTED IMMEDIATELY:  *FEVER GREATER THAN 100.5 F  *CHILLS WITH OR WITHOUT FEVER  NAUSEA AND VOMITING THAT IS NOT CONTROLLED WITH YOUR NAUSEA MEDICATION  *UNUSUAL SHORTNESS OF BREATH  *UNUSUAL BRUISING OR BLEEDING  TENDERNESS IN MOUTH AND THROAT WITH OR WITHOUT PRESENCE OF ULCERS  *URINARY PROBLEMS  *BOWEL PROBLEMS  UNUSUAL RASH Items with * indicate a potential emergency and should be followed up as soon as possible.  Feel free to call the clinic should you have any questions or concerns. The clinic phone number is [phone].  Please show the CHEMO ALERT CARD at check-in to the Emergency Department and triage nurse.   

## 2018-06-08 NOTE — Progress Notes (Signed)
Nutrition Follow-up:  Patient with triple positive breast cancer.  Patient receiving chemotherapy.    Met with patient in infusion this am for nutrition follow-up.  Patient reports good appetite.  "I am gaining weight."  No nutrition impact symptoms currently.    Medications: reviewed  Labs: reviewed  Anthropometrics:   Weight increased to 168 lb 9.6 oz today from 166 lb 6.4 oz on 5/21   NUTRITION DIAGNOSIS: Inadequate oral intake improved   INTERVENTION:   Encouraged patient to continue good sources of protein and well balanced diet.   Encouraged patient to reach out to RD if nutrition issues arise.      MONITORING, EVALUATION, GOAL: Patient will consume adequate calories and protein to meet nutritional needs during treatment   NEXT VISIT: as needed.  Patient to reach out to [place]. Zenia Resides, [place], [place] Registered Dietitian [phone] (pager)

## 2018-06-09 ENCOUNTER — Inpatient Hospital Stay: Payer: PPO

## 2018-06-09 ENCOUNTER — Telehealth: Payer: Self-pay | Admitting: Adult Health

## 2018-06-09 VITALS — BP 123/83 | HR 87 | Temp 99.1°F | Resp 16

## 2018-06-09 DIAGNOSIS — Z5111 Encounter for antineoplastic chemotherapy: Secondary | ICD-10-CM | POA: Diagnosis not present

## 2018-06-09 DIAGNOSIS — Z95828 Presence of other vascular implants and grafts: Secondary | ICD-10-CM

## 2018-06-09 MED ORDER — TBO-FILGRASTIM 480 MCG/0.8ML ~~LOC~~ SOSY
PREFILLED_SYRINGE | SUBCUTANEOUS | Status: AC
  Filled 2018-06-09: qty 0.8

## 2018-06-09 MED ORDER — TBO-FILGRASTIM 480 MCG/0.8ML ~~LOC~~ SOSY
480.0000 ug | PREFILLED_SYRINGE | Freq: Once | SUBCUTANEOUS | Status: AC
  Administered 2018-06-09: 480 ug via SUBCUTANEOUS

## 2018-06-09 NOTE — Patient Instructions (Signed)
Tbo-Filgrastim injection What is this medicine? TBO-FILGRASTIM (T B O fil GRA stim) is a granulocyte colony-stimulating factor that stimulates the growth of neutrophils, a type of white blood cell important in the body's fight against infection. It is used to reduce the incidence of fever and infection in patients with certain types of cancer who are receiving chemotherapy that affects the bone marrow. This medicine may be used for other purposes; ask your health care provider or pharmacist if you have questions. COMMON BRAND NAME(S): Granix What should I tell my health care provider before I take this medicine? They need to know if you have any of these conditions: -bone scan or tests planned -kidney disease -sickle cell anemia -an unusual or allergic reaction to tbo-filgrastim, filgrastim, pegfilgrastim, other medicines, foods, dyes, or preservatives -pregnant or trying to get pregnant -breast-feeding How should I use this medicine? This medicine is for injection under the skin. If you get this medicine at home, you will be taught how to prepare and give this medicine. Refer to the Instructions for Use that come with your medication packaging. Use exactly as directed. Take your medicine at regular intervals. Do not take your medicine more often than directed. It is important that you put your used needles and syringes in a special sharps container. Do not put them in a trash can. If you do not have a sharps container, call your pharmacist or healthcare provider to get one. Talk to your pediatrician regarding the use of this medicine in children. Special care may be needed. Overdosage: If you think you have taken too much of this medicine contact a poison control center or emergency room at once. NOTE: This medicine is only for you. Do not share this medicine with others. What if I miss a dose? It is important not to miss your dose. Call your doctor or health care professional if you miss a  dose. What may interact with this medicine? This medicine may interact with the following medications: -medicines that may cause a release of neutrophils, such as lithium This list may not describe all possible interactions. Give your health care provider a list of all the medicines, herbs, non-prescription drugs, or dietary supplements you use. Also tell them if you smoke, drink alcohol, or use illegal drugs. Some items may interact with your medicine. What should I watch for while using this medicine? You may need blood work done while you are taking this medicine. What side effects may I notice from receiving this medicine? Side effects that you should report to your doctor or health care professional as soon as possible: -allergic reactions like skin rash, itching or hives, swelling of the face, lips, or tongue -blood in the urine -dark urine -dizziness -fast heartbeat -feeling faint -shortness of breath or breathing problems -signs and symptoms of infection like fever or chills; cough; or sore throat -signs and symptoms of kidney injury like trouble passing urine or change in the amount of urine -stomach or side pain, or pain at the shoulder -sweating -swelling of the legs, ankles, or abdomen -tiredness Side effects that usually do not require medical attention (report to your doctor or health care professional if they continue or are bothersome): -bone pain -headache -muscle pain -vomiting This list may not describe all possible side effects. Call your doctor for medical advice about side effects. You may report side effects to FDA at 1-800-FDA-1088. Where should I keep my medicine? Keep out of the reach of children. Store in a refrigerator between   2 and 8 degrees C (36 and 46 degrees F). Keep in carton to protect from light. Throw away this medicine if it is left out of the refrigerator for more than 5 consecutive days. Throw away any unused medicine after the expiration  date. NOTE: This sheet is a summary. It may not cover all possible information. If you have questions about this medicine, talk to your doctor, pharmacist, or health care provider.  2018 [employer]vier/Gold Standard (2016-01-28 19:07:04)  

## 2018-06-09 NOTE — Telephone Encounter (Signed)
Per 6/18 no los °

## 2018-06-10 ENCOUNTER — Inpatient Hospital Stay: Payer: PPO

## 2018-06-10 VITALS — BP 124/67 | HR 86 | Temp 98.4°F

## 2018-06-10 DIAGNOSIS — Z5111 Encounter for antineoplastic chemotherapy: Secondary | ICD-10-CM | POA: Diagnosis not present

## 2018-06-10 DIAGNOSIS — Z95828 Presence of other vascular implants and grafts: Secondary | ICD-10-CM

## 2018-06-10 MED ORDER — TBO-FILGRASTIM 480 MCG/0.8ML ~~LOC~~ SOSY
480.0000 ug | PREFILLED_SYRINGE | Freq: Once | SUBCUTANEOUS | Status: AC
  Administered 2018-06-10: 480 ug via SUBCUTANEOUS

## 2018-06-10 MED ORDER — TBO-FILGRASTIM 480 MCG/0.8ML ~~LOC~~ SOSY
PREFILLED_SYRINGE | SUBCUTANEOUS | Status: AC
Start: 2018-06-10 — End: ?
  Filled 2018-06-10: qty 0.8

## 2018-06-11 ENCOUNTER — Inpatient Hospital Stay: Payer: PPO

## 2018-06-11 VITALS — BP 113/64 | HR 80 | Temp 98.4°F

## 2018-06-11 DIAGNOSIS — Z95828 Presence of other vascular implants and grafts: Secondary | ICD-10-CM

## 2018-06-11 DIAGNOSIS — Z5111 Encounter for antineoplastic chemotherapy: Secondary | ICD-10-CM | POA: Diagnosis not present

## 2018-06-11 MED ORDER — TBO-FILGRASTIM 480 MCG/0.8ML ~~LOC~~ SOSY
480.0000 ug | PREFILLED_SYRINGE | Freq: Once | SUBCUTANEOUS | Status: AC
  Administered 2018-06-11: 480 ug via SUBCUTANEOUS

## 2018-06-11 MED ORDER — TBO-FILGRASTIM 480 MCG/0.8ML ~~LOC~~ SOSY
PREFILLED_SYRINGE | SUBCUTANEOUS | Status: AC
Start: 2018-06-11 — End: ?
  Filled 2018-06-11: qty 0.8

## 2018-06-15 ENCOUNTER — Inpatient Hospital Stay: Payer: PPO

## 2018-06-15 ENCOUNTER — Inpatient Hospital Stay (HOSPITAL_BASED_OUTPATIENT_CLINIC_OR_DEPARTMENT_OTHER): Payer: PPO | Admitting: Adult Health

## 2018-06-15 ENCOUNTER — Other Ambulatory Visit: Payer: Self-pay | Admitting: Oncology

## 2018-06-15 ENCOUNTER — Telehealth: Payer: Self-pay | Admitting: Adult Health

## 2018-06-15 ENCOUNTER — Encounter: Payer: Self-pay | Admitting: Adult Health

## 2018-06-15 VITALS — BP 130/75 | HR 73 | Temp 98.6°F | Resp 18 | Ht 65.0 in | Wt 168.5 lb

## 2018-06-15 DIAGNOSIS — Z9013 Acquired absence of bilateral breasts and nipples: Secondary | ICD-10-CM

## 2018-06-15 DIAGNOSIS — C50912 Malignant neoplasm of unspecified site of left female breast: Secondary | ICD-10-CM

## 2018-06-15 DIAGNOSIS — Z170 Estrogen receptor positive status [ER+]: Secondary | ICD-10-CM

## 2018-06-15 DIAGNOSIS — Z923 Personal history of irradiation: Secondary | ICD-10-CM

## 2018-06-15 DIAGNOSIS — Z171 Estrogen receptor negative status [ER-]: Secondary | ICD-10-CM | POA: Diagnosis not present

## 2018-06-15 DIAGNOSIS — Z8041 Family history of malignant neoplasm of ovary: Secondary | ICD-10-CM

## 2018-06-15 DIAGNOSIS — Z801 Family history of malignant neoplasm of trachea, bronchus and lung: Secondary | ICD-10-CM | POA: Diagnosis not present

## 2018-06-15 DIAGNOSIS — C50812 Malignant neoplasm of overlapping sites of left female breast: Secondary | ICD-10-CM

## 2018-06-15 DIAGNOSIS — C50411 Malignant neoplasm of upper-outer quadrant of right female breast: Secondary | ICD-10-CM

## 2018-06-15 DIAGNOSIS — Z8042 Family history of malignant neoplasm of prostate: Secondary | ICD-10-CM

## 2018-06-15 DIAGNOSIS — Z87891 Personal history of nicotine dependence: Secondary | ICD-10-CM

## 2018-06-15 DIAGNOSIS — Z5111 Encounter for antineoplastic chemotherapy: Secondary | ICD-10-CM | POA: Diagnosis not present

## 2018-06-15 DIAGNOSIS — Z95828 Presence of other vascular implants and grafts: Secondary | ICD-10-CM

## 2018-06-15 LAB — CMP (CANCER CENTER ONLY)
ALBUMIN: 3.6 g/dL (ref 3.5–5.0)
ALT: 31 U/L (ref 0–44)
AST: 30 U/L (ref 15–41)
Alkaline Phosphatase: 195 U/L — ABNORMAL HIGH (ref 38–126)
Anion gap: 7 (ref 5–15)
BILIRUBIN TOTAL: 0.3 mg/dL (ref 0.3–1.2)
BUN: 9 mg/dL (ref 8–23)
CO2: 27 mmol/L (ref 22–32)
Calcium: 9.2 mg/dL (ref 8.9–10.3)
Chloride: 105 mmol/L (ref 98–111)
Creatinine: 0.55 mg/dL (ref 0.44–1.00)
GFR, Est AFR Am: >60 mL/min (ref >60)
GFR, Estimated: >60 mL/min (ref >60)
GLUCOSE: 85 mg/dL (ref 70–99)
POTASSIUM: 4.1 mmol/L (ref 3.5–5.1)
Sodium: 139 mmol/L (ref 135–145)
TOTAL PROTEIN: 6.1 g/dL — AB (ref 6.5–8.1)

## 2018-06-15 LAB — CBC WITH DIFFERENTIAL (CANCER CENTER ONLY)
BASOS ABS: 0 10*3/uL (ref 0.0–0.1)
BASOS PCT: 1 %
EOS ABS: 0 10*3/uL (ref 0.0–0.5)
EOS PCT: 0 %
HEMATOCRIT: 30.4 % — AB (ref 34.8–46.6)
HEMOGLOBIN: 10.1 g/dL — AB (ref 11.6–15.9)
Lymphocytes Relative: 31 %
Lymphs Abs: 0.8 10*3/uL — ABNORMAL LOW (ref 0.9–3.3)
MCH: 33.3 pg (ref 25.1–34.0)
MCHC: 33.3 g/dL (ref 31.5–36.0)
MCV: 99.9 fL (ref 79.5–101.0)
MONO ABS: 0.5 10*3/uL (ref 0.1–0.9)
MONOS PCT: 18 %
NEUTROS ABS: 1.3 10*3/uL — AB (ref 1.5–6.5)
Neutrophils Relative %: 50 %
Platelet Count: 190 10*3/uL (ref 145–400)
RBC: 3.05 MIL/uL — ABNORMAL LOW (ref 3.70–5.45)
RDW: 18.5 % — AB (ref 11.2–14.5)
WBC Count: 2.7 10*3/uL — ABNORMAL LOW (ref 3.9–10.3)

## 2018-06-15 MED ORDER — DIPHENHYDRAMINE HCL 50 MG/ML IJ SOLN
25.0000 mg | Freq: Once | INTRAMUSCULAR | Status: AC
  Administered 2018-06-15: 25 mg via INTRAVENOUS

## 2018-06-15 MED ORDER — PALONOSETRON HCL INJECTION 0.25 MG/5ML
INTRAVENOUS | Status: AC
  Filled 2018-06-15: qty 5

## 2018-06-15 MED ORDER — SODIUM CHLORIDE 0.9% FLUSH
10.0000 mL | INTRAVENOUS | Status: DC | PRN
  Administered 2018-06-15: 10 mL
  Filled 2018-06-15: qty 10

## 2018-06-15 MED ORDER — DEXAMETHASONE SODIUM PHOSPHATE 10 MG/ML IJ SOLN
INTRAMUSCULAR | Status: AC
  Filled 2018-06-15: qty 1

## 2018-06-15 MED ORDER — DIPHENHYDRAMINE HCL 50 MG/ML IJ SOLN
INTRAMUSCULAR | Status: AC
  Filled 2018-06-15: qty 1

## 2018-06-15 MED ORDER — HEPARIN SOD (PORK) LOCK FLUSH 100 UNIT/ML IV SOLN
500.0000 [IU] | Freq: Once | INTRAVENOUS | Status: AC | PRN
  Administered 2018-06-15: 500 [IU]
  Filled 2018-06-15: qty 5

## 2018-06-15 MED ORDER — FAMOTIDINE IN NACL 20-0.9 MG/50ML-% IV SOLN
20.0000 mg | Freq: Once | INTRAVENOUS | Status: AC
  Administered 2018-06-15: 20 mg via INTRAVENOUS

## 2018-06-15 MED ORDER — SODIUM CHLORIDE 0.9 % IV SOLN
Freq: Once | INTRAVENOUS | Status: AC
  Administered 2018-06-15: 14:00:00 via INTRAVENOUS

## 2018-06-15 MED ORDER — SODIUM CHLORIDE 0.9 % IV SOLN
640.0000 mg/m2 | Freq: Once | INTRAVENOUS | Status: AC
  Administered 2018-06-15: 1254 mg via INTRAVENOUS
  Filled 2018-06-15: qty 32.98

## 2018-06-15 MED ORDER — SODIUM CHLORIDE 0.9 % IV SOLN
153.7600 mg | Freq: Once | INTRAVENOUS | Status: AC
  Administered 2018-06-15: 150 mg via INTRAVENOUS
  Filled 2018-06-15: qty 15

## 2018-06-15 MED ORDER — DEXAMETHASONE SODIUM PHOSPHATE 10 MG/ML IJ SOLN
4.0000 mg | Freq: Once | INTRAMUSCULAR | Status: AC
  Administered 2018-06-15: 4 mg via INTRAVENOUS

## 2018-06-15 MED ORDER — PEGFILGRASTIM 6 MG/0.6ML ~~LOC~~ PSKT
6.0000 mg | PREFILLED_SYRINGE | Freq: Once | SUBCUTANEOUS | Status: AC
  Administered 2018-06-15: 6 mg via SUBCUTANEOUS

## 2018-06-15 MED ORDER — FAMOTIDINE IN NACL 20-0.9 MG/50ML-% IV SOLN
INTRAVENOUS | Status: AC
  Filled 2018-06-15: qty 50

## 2018-06-15 MED ORDER — PALONOSETRON HCL INJECTION 0.25 MG/5ML
0.2500 mg | Freq: Once | INTRAVENOUS | Status: AC
  Administered 2018-06-15: 0.25 mg via INTRAVENOUS

## 2018-06-15 MED ORDER — PEGFILGRASTIM 6 MG/0.6ML ~~LOC~~ PSKT
PREFILLED_SYRINGE | SUBCUTANEOUS | Status: AC
  Filled 2018-06-15: qty 0.6

## 2018-06-15 NOTE — Progress Notes (Signed)
Ok to treat with [place] per Mendel Ryder, NP

## 2018-06-15 NOTE — Telephone Encounter (Signed)
Gave avs and calendar ° °

## 2018-06-15 NOTE — Patient Instructions (Signed)
Cancer Center Discharge Instructions for Patients Receiving Chemotherapy  Today you received the following chemotherapy agents Gemzar and Carboplatin   To help prevent nausea and vomiting after your treatment, we encourage you to take your nausea medication as directed.    If you develop nausea and vomiting that is not controlled by your nausea medication, call the clinic.   BELOW ARE SYMPTOMS THAT SHOULD BE REPORTED IMMEDIATELY:  *FEVER GREATER THAN 100.5 F  *CHILLS WITH OR WITHOUT FEVER  NAUSEA AND VOMITING THAT IS NOT CONTROLLED WITH YOUR NAUSEA MEDICATION  *UNUSUAL SHORTNESS OF BREATH  *UNUSUAL BRUISING OR BLEEDING  TENDERNESS IN MOUTH AND THROAT WITH OR WITHOUT PRESENCE OF ULCERS  *URINARY PROBLEMS  *BOWEL PROBLEMS  UNUSUAL RASH Items with * indicate a potential emergency and should be followed up as soon as possible.  Feel free to call the clinic should you have any questions or concerns. The clinic phone number is [phone].  Please show the CHEMO ALERT CARD at check-in to the Emergency Department and triage nurse.   

## 2018-06-15 NOTE — Progress Notes (Signed)
[place]  [phone]) [phone] Fax:([phone]) [phone]     ID: Samantha Sullivan DOB: [birth year]  MR#: [id number]  TGY#:[id number]  Patient Care Team: Shon Baton, MD as PCP - General (Internal Medicine) Magrinat, Virgie Dad, MD as Consulting Physician (Oncology) Kem Boroughs, Addy as Nurse Practitioner (Family Medicine) Regal, Tamala Fothergill, DPM as Consulting Physician (Podiatry) Excell Seltzer, MD as Consulting Physician (General Surgery) Force, Shari Prows, DO as Referring Physician (Student) PCP: Shon Baton, MD OTHER MD:  CHIEF COMPLAINT: Contralateral breast cancer, triple negative  CURRENT TREATMENT: Adjuvant chemotherapy  INTERVAL HISTORY: Samantha Sullivan is here today for follow up and discussion of treatment planning for her triple negative breast cancer.  She has been receiving weekly Gemcitabine and Carboplatin for her triple negative breast cancer.  She continues to tolerate this moderately well.    REVIEW OF SYSTEMS: Samantha Sullivan is doing well today.  She did have an issue earlier this week with fatigue and constipation.  She has never really struggled with constipation before, so this has been uncomfortable for her.  She is on a bowel regimen with increased fiber intake which has helped.  Samantha Sullivan's peripheral neuropathy is also improved.  She denies fevers, chills, vomiting, headaches, chest pain, cough, palpitations, decreased appetite, or any other issues.  A detailed ROS was non contributory.    RIGHT BREAST CANCER HISTORY:  From the original intake note:  "Samantha Sullivan"noted a lump in her right breast sometime in late October or early November, but there were "so many things going on in her life" she did not bring it to medical attention until she saw [name] 12/11/2015. Samantha Sullivan was able to palpate a mass at the 9:30 o'clock position in the right breast, which was mobile and nontender. The left breast is status post prior remote biopsy and there was some scar tissue at the 7:00  position. The patient was set up for bilateral diagnostic Mammography with tomosynthesis at [facility] 12/27/2015. This found the breast density to be category C. There was a new oval mass in the right breast upper outer quadrant. Ultrasound the same day confirmed a 2.2 cm mass with irregular margins in the right breast at the 9:00 position read as highly suggestive of malignancy.  Biopsy of the right breast mass in question 01/02/2016 found ([id number]) an invasive ductal carcinoma, grade 1, estrogen receptor 100% positive, progesterone receptor 90% positive, both with strong staining intensity, with an MIB-1 of 10%, and no HER-2 amplification, the signals ratio being 1.32 and the number per cell 2.05. The patient was informed and instructed to stop hormone replacement therapy (last dose 01/04/2016).  Her subsequent history is as detailed below.    PAST MEDICAL HISTORY: Past Medical History:  Diagnosis Date  . Abnormal CT of the chest 12/06   numeroous lung nodules - most have resolved.  . Arthritis    psoriatic arthritis- toes & feet, but resolved at this point- no need meds at this point    . Cancer [facility])    right breast cancer  . Complication of anesthesia    /w colonoscopy- woke up before procedure was complete  . Family history of adverse reaction to anesthesia    pt's mother reported difficulty being given " enough " medicine to outcome a successful anesth. experience  . Family history of leukemia   . Family history of ovarian cancer   . History of meningioma 2007   in 2013 stable on MRI and recheck in 5 years, no surgery necessry   .  HSV infection 1970's- 1980's  . Psoriatic arthritis ([place])     PAST SURGICAL HISTORY: Past Surgical History:  Procedure Laterality Date  . BILATERAL TOTAL MASTECTOMY WITH AXILLARY LYMPH NODE DISSECTION Bilateral 01/04/2018   Procedure: BILATERAL TOTAL MASTECTOMY PROPHYLATIC RIGHT  WITH LEFT SENTINEAL  LYMPH NODE;  Surgeon: Excell Seltzer, MD;   Location: [place];  Service: General;  Laterality: Bilateral;  . BREAST LUMPECTOMY WITH RADIOACTIVE SEED AND SENTINEL LYMPH NODE BIOPSY Right 01/22/2016   Procedure: RIGHT BREAST LUMPECTOMY WITH RADIOACTIVE SEED AND SENTINEL LYMPH NODE BIOPSY;  Surgeon: Excell Seltzer, MD;  Location: [place];  Service: General;  Laterality: Right;  . BREAST SURGERY Left 1971, Mar 26, 2015   lumpectomy benign, March 26, 2015- Br. Ca- R lumpectomy & radiation   . COLONOSCOPY  8/06   normal and recheck in 10 years  . HAMMER TOE SURGERY Left 03-26-15  . MASTECTOMY Right 01/04/2018   PROPHYLATIC   . MASTECTOMY COMPLETE / SIMPLE W/ SENTINEL NODE BIOPSY Left 01/04/2018  . PORTACATH PLACEMENT Right 01/04/2018   Procedure: INSERTION PORT-A-CATH;  Surgeon: Excell Seltzer, MD;  Location: [place];  Service: General;  Laterality: Right;  . THYROIDECTOMY, PARTIAL Left 1983    FAMILY HISTORY Family History  Problem Relation Age of Onset  . Osteoporosis Mother   . Osteoporosis Sister   . Lung cancer Maternal Aunt        smoker  . Heart Problems Maternal Grandmother   . COPD Maternal Grandfather   . Neurodegenerative disease Maternal Aunt        corical-ganglion degeneration  . Ovarian cancer Maternal Aunt        dx in her 34s  . Leukemia Cousin        10s-40s; maternal cousin  [name] has essentially no information on her father's side of the family aside from the fact that he died in his 23s from heart disease. He had psoriasis. The patient's mother passed away 2017-03-25 age 51, due to breast cancer. The patient had 2 brothers who died from congenital heart disease shortly after birth. The patient has one sister. The patient's mother had one sister, the patient's aunt, diagnosed with ovarian cancer in her 40s.  GYNECOLOGIC HISTORY:  Patient's last menstrual period was 03/22/2000 (approximate). Menarche age 45, first live birth age 57. The patient is GX P2. She went through menopause approximately March 25, 1989 but was taking  hormone replacement until 01/04/2016. Since coming off replacement she has had insomnia but no hot flashes or vaginal dryness pleural bones so 4. 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 She is divorced and lives alone, with no pets. Her daughter [name] lives in [place]. She is disabled secondary to schizoaffective disorder. Daughter [name] lives in [place] and she is a Estate agent but mostly a homemaker. The patient has 3 grandchildren, the older 23, the other 2 being under 23 years old. The patient attends [employer]. Her mother passed away in 03/25/2017 due to breast cancer.    ADVANCED DIRECTIVES: her daughter [name] is her healthcare power of attorney. She can be reached at [place]: Social History   Tobacco Use  . Smoking status: Former Smoker    Packs/day: 1.00    Years: 12.00    Pack years: 12.00    Types: Cigarettes    Last attempt to quit:  07/23/1981    Years since quitting: 36.9  . Smokeless tobacco: Never Used  Substance Use Topics  . Alcohol use: Yes    Alcohol/week: 0.0 oz    Comment: social occasion only- special   . Drug use: No     Colonoscopy: ; [facility]  ATF:TDDUKGU 2016/[place]  Bone density:rJanuary 2018/normal   Lipid panel:  Allergies  Allergen Reactions  . Bee Venom   . Ciprofloxacin   . Levaquin [Levofloxacin In D5w]   . Norco [Hydrocodone-Acetaminophen] Other (See Comments)    Pt states Norco made her extremely hyper after her surgery and she was not able to sleep.    Current Outpatient Medications  Medication Sig Dispense Refill  . folic acid (FOLVITE) 1 MG tablet Take 2 mg by mouth every evening.    . lidocaine-prilocaine (EMLA) cream Apply to affected area once 30 g 3  . LORazepam (ATIVAN) 0.5 MG tablet Take 1 tablet (0.5 mg  total) by mouth at bedtime as needed and may repeat dose one time if needed (Nausea or vomiting). 10 tablet 0  . Polyethyl Glycol-Propyl Glycol (LUBRICANT EYE DROPS) 0.4-0.3 % SOLN Place 1-2 drops into both eyes 3 (three) times daily as needed (dry eyes/irritated eyes.).    Marland Kitchen prochlorperazine (COMPAZINE) 10 MG tablet Take 1 tablet (10 mg total) by mouth every 6 (six) hours as needed (Nausea or vomiting). 30 tablet 1   Current Facility-Administered Medications  Medication Dose Route Frequency Provider Last Rate Last Dose  . triamcinolone acetonide (KENALOG) 10 MG/ML injection 10 mg  10 mg Other Once Wallene Huh, DPM        OBJECTIVE:  Vitals:   06/15/18 1259  BP: 130/75  Sullivan: 73  Resp: 18  Temp: 98.6 F (37 C)  SpO2: 100%     Body mass index is 28.04 kg/m.   ECOG FS:1 - Symptomatic but completely ambulatory GENERAL: Patient is a well appearing female in no acute distress HEENT:  Sclerae anicteric.  Oropharynx clear and moist. No ulcerations or evidence of oropharyngeal candidiasis. Neck is supple.  NODES:  No cervical, supraclavicular, or axillary lymphadenopathy palpated.  BREAST EXAM:  Deferred. LUNGS:  Clear to auscultation bilaterally.  No wheezes or rhonchi. HEART:  Regular rate and rhythm. No murmur appreciated. ABDOMEN:  Soft, nontender.  Positive, normoactive bowel sounds. No organomegaly palpated. MSK:  No focal spinal tenderness to palpation. Full range of motion bilaterally in the upper extremities. EXTREMITIES:  No peripheral edema.   SKIN:  Clear with no obvious rashes or skin changes. + nail dyscrasia NEURO:  Nonfocal. Well oriented.  Appropriate affect.   LAB RESULTS:  CMP     Component Value Date/Time   NA 140 06/08/2018 0748   NA 141 08/05/2017 1335   K 4.0 06/08/2018 0748   K 4.5 08/05/2017 1335   CL 106 06/08/2018 0748   CO2 26 06/08/2018 0748   CO2 29 08/05/2017 1335   GLUCOSE 95 06/08/2018 0748   GLUCOSE 77 08/05/2017 1335   BUN 16 06/08/2018  0748   BUN 13.4 08/05/2017 1335   CREATININE 0.67 06/08/2018 0748   CREATININE 0.8 08/05/2017 1335   CALCIUM 9.6 06/08/2018 0748   CALCIUM 10.0 08/05/2017 1335   PROT 6.3 (L) 06/08/2018 0748   PROT 6.8 08/05/2017 1335   ALBUMIN 3.7 06/08/2018 0748   ALBUMIN 3.6 08/05/2017 1335   AST 16 06/08/2018 0748   AST 19 08/05/2017 1335   ALT 7 06/08/2018 0748   ALT 12 08/05/2017 1335  ALKPHOS 197 (H) 06/08/2018 0748   ALKPHOS 169 (H) 08/05/2017 1335   BILITOT 0.3 06/08/2018 0748   BILITOT 0.63 08/05/2017 1335   GFRNONAA >60 06/08/2018 0748   GFRAA >60 06/08/2018 0748    INo results found for: SPEP, UPEP  Lab Results  Component Value Date   WBC 2.7 (L) 06/15/2018   NEUTROABS 1.3 (L) 06/15/2018   HGB 10.1 (L) 06/15/2018   HCT 30.4 (L) 06/15/2018   MCV 99.9 06/15/2018   PLT 190 06/15/2018      Chemistry      Component Value Date/Time   NA 140 06/08/2018 0748   NA 141 08/05/2017 1335   K 4.0 06/08/2018 0748   K 4.5 08/05/2017 1335   CL 106 06/08/2018 0748   CO2 26 06/08/2018 0748   CO2 29 08/05/2017 1335   BUN 16 06/08/2018 0748   BUN 13.4 08/05/2017 1335   CREATININE 0.67 06/08/2018 0748   CREATININE 0.8 08/05/2017 1335      Component Value Date/Time   CALCIUM 9.6 06/08/2018 0748   CALCIUM 10.0 08/05/2017 1335   ALKPHOS 197 (H) 06/08/2018 0748   ALKPHOS 169 (H) 08/05/2017 1335   AST 16 06/08/2018 0748   AST 19 08/05/2017 1335   ALT 7 06/08/2018 0748   ALT 12 08/05/2017 1335   BILITOT 0.3 06/08/2018 0748   BILITOT 0.63 08/05/2017 1335       No results found for: LABCA2  No components found for: [id number]  No results for input(s): INR in the last 168 hours.  Urinalysis    Component Value Date/Time   BILIRUBINUR neg 12/11/2015 0925   PROTEINUR neg 12/11/2015 0925   UROBILINOGEN negative 12/11/2015 0925   NITRITE neg 12/11/2015 0925   LEUKOCYTESUR Negative 12/11/2015 0925    STUDIES: No results found.  RESEARCH: enrolled in UPBEAT study; not a  candidate for BR 003 because of concurrent cancer  ASSESSMENT: 67 y.o. [place] woman status post right breast upper outer quadrant biopsy 01/02/2016 for a clinical T2 N0, stage IIA invasive ductal carcinoma, grade 1, estrogen and progesterone receptor positive, HER-2 not amplified, with an MIB-1 of 10%.  (1). Right lumpectomy and sentinel lymph node sampling 01/22/2016 showed a pT1c pN0, stage IA invasive ductal carcinoma, grade 1, with close but negative margins. Repeat HER-2 testing was again not amplified   (3) Oncotype score of 18 predicts a risk of recurrence outside the breast within the next 10 years of 11% if the patient's only systemic therapy is tamoxifen for 5 years. It also predicts no significant benefit from chemotherapy.     (4) adjuvant radiation3/14/17-4/11/17 Site/dose:   Right breast - 42.72 gy at 2.67 Gy per fraction x 16 fractions followed by an electron boost to the right breast with 10 Gy at 2 Gy per fraction x 5 fractions.    (5)  Anastrozole started 04/21/2016, discontinued after approximately 1 month with significant side effects  (a)  [facility] 2013 was normal  (b) bone density January 2018 was normal  (c) anastrozole restarted February 2018, held again at the start of chemotherapy February 2019  (6) genetics testing  02/24/2016 through the Breast/Ovarian cancer gene panel offered by [facility] found no deleterious mutations in ATM, BARD1, BRCA1, BRCA2, BRIP1, CDH1, CHEK2, EPCAM, FANCC, MLH1, MSH2, MSH6, NBN, PALB2, PMS2, PTEN, RAD51C, RAD51D, TP53, and XRCC2.   (7) left breast biopsy 12/17/2017 showed a cT2 cN0, stage IIA- IIB invasive ductal carcinoma, grade 2, estrogen receptor weakly positive, progesterone  receptor negative, with HER-2 equivocal by FISH, negative by immunohistochemistry  (8) status post bilateral mastectomies and left sentinel lymph node sampling 01/04/2018 showing  (a) on the right, no evidence of disease  (b) on  the left, a pT2 pN0, stage IIB invasive ductal carcinoma, grade 3, triple negative  (9) adjuvant chemotherapy consisting of cyclophosphamide and doxorubicin in dose dense fashion x4 started 02/16/2018, completed 03/30/2018,  followed by weekly paclitaxel /carboplatin x12 starting 04/13/2018; Paclitaxel/Carboplatin stopped after 3 cycles due to peripheral neuropathy.  Gemcitabine Carboplatin started on 5/21, but held x 3 weeks due to prolonged neutropenia requiring both Granix and Neulasta.      PLAN:  Samantha Sullivan is doing well today.  She will proceed with her next weekly Gemcitabine/Carboplatin.  She has an [place] of 1.3.  She received 3 Granix injections last week.  Due to this, she will receive treatment today, and then receive Onpro and take a week off from chemotherapy.    We will see Samantha Sullivan back on 06/29/18 for labs, eval and her next Gemcitabine/Carboplatin.  I have gone ahead and ordered her Granix injections for 7/10, 7/11, and 7/12.  I have also requested these injection appointments be scheduled.    We will see her back in weeks for labs and evaluation prior to her treatment. She knows to call for any other problems that may develop before the next visit.    A total of (30) minutes of face-to-face time was spent with this patient with greater than 50% of that time in counseling and care-coordination.   Wilber Bihari, NP  06/15/18 1:04 PM Medical Oncology and Hematology [facility] [address] [place], [place] [address] Tel. [phone]    Fax. [phone]

## 2018-06-17 ENCOUNTER — Other Ambulatory Visit: Payer: Self-pay | Admitting: Oncology

## 2018-06-17 ENCOUNTER — Inpatient Hospital Stay: Payer: PPO | Admitting: Oncology

## 2018-06-17 ENCOUNTER — Telehealth: Payer: Self-pay | Admitting: Oncology

## 2018-06-17 DIAGNOSIS — Z95828 Presence of other vascular implants and grafts: Secondary | ICD-10-CM

## 2018-06-17 DIAGNOSIS — Z5111 Encounter for antineoplastic chemotherapy: Secondary | ICD-10-CM | POA: Diagnosis not present

## 2018-06-17 DIAGNOSIS — C50912 Malignant neoplasm of unspecified site of left female breast: Secondary | ICD-10-CM

## 2018-06-17 MED ORDER — PEGFILGRASTIM INJECTION 6 MG/0.6ML ~~LOC~~
PREFILLED_SYRINGE | SUBCUTANEOUS | Status: AC
  Filled 2018-06-17: qty 0.6

## 2018-06-17 MED ORDER — PEGFILGRASTIM INJECTION 6 MG/0.6ML ~~LOC~~
6.0000 mg | PREFILLED_SYRINGE | Freq: Once | SUBCUTANEOUS | Status: DC
  Administered 2018-06-17: 6 mg via SUBCUTANEOUS

## 2018-06-17 NOTE — Progress Notes (Signed)
[name]'s OnPro malfunctioned.  She will receive Neulasta today.

## 2018-06-17 NOTE — Telephone Encounter (Signed)
No 6/27 los.

## 2018-06-17 NOTE — Progress Notes (Signed)
Samantha Sullivan's OnPro malfunction and she did not receive the dose.  We are giving her a dose of Neulasta today

## 2018-06-22 ENCOUNTER — Inpatient Hospital Stay: Payer: PPO

## 2018-06-22 ENCOUNTER — Inpatient Hospital Stay: Payer: PPO | Admitting: Oncology

## 2018-06-28 NOTE — Progress Notes (Signed)
[place]  [phone] [phone]     ID: Samantha Sullivan DOB: March 31, 1951  MR#: [id number]  ALP#:[id number]  Patient Care Team: Samantha Baton, MD as PCP - General (Internal Medicine) , Samantha Dad, MD as Consulting Physician (Oncology) Samantha Sullivan, Samantha Sullivan as Nurse Practitioner (Family Medicine) Samantha Sullivan, Samantha Sullivan, Samantha Sullivan as Consulting Physician (Podiatry) Samantha Seltzer, MD as Consulting Physician (General Surgery) Samantha Sullivan, Samantha Prows, Samantha Sullivan as Referring Physician (Student) PCP: Samantha Baton, MD OTHER MD:  CHIEF COMPLAINT: Contralateral breast cancer, triple negative  CURRENT TREATMENT: Adjuvant chemotherapy  INTERVAL HISTORY: Samantha Sullivan "Samantha Sullivan" returns today for follow-up and treatment of her contralateral breast cancer, triple negative.   The patient continues on carboplatin and gemcitabine, which she now receives days 1 and 8 of each 21-day cycle, with Granix on days 2,  3 and 4 and Neulasta/OnPro on day 9.  Today is day 1 of the current cycle-- but a better way to count is that today is the 6th of 12 planned doses of carbo-associated chemo.  She has been tolerating it well, however, her nails have been breaking and especially the thumb nails may be coming loose.  This only causes her pain when they get snagged, so cutting the nails closed will be a good idea for her.  REVIEW OF SYSTEMS: Samantha Sullivan is doing well. She is not sleeping well and needs her Rx Ambien refilled. She has bene gaining weight because she has been eating more. She continues to experience constipation. She uses MiraLAX and stool softeners to help. The patient denies unusual headaches, visual changes, nausea, vomiting, or dizziness. There has been no unusual cough, phlegm production, or pleurisy. This been no change in bladder habits. The patient denies unexplained fatigue or unexplained weight loss, bleeding, rash, or fever. A detailed review of systems was otherwise  noncontributory.    RIGHT BREAST CANCER HISTORY:  From the original intake note:  "Samantha Sullivan"noted a lump in her right breast sometime in late October or early November, but there were "so many things going on in her life" she did not bring it to medical attention until she saw Samantha Sullivan 12/11/2015. Samantha Sullivan was able to palpate a mass at the 9:30 o'clock position in the right breast, which was mobile and nontender. The left breast is status post prior remote biopsy and there was some scar tissue at the 7:00 position. The patient was set up for bilateral diagnostic Mammography with tomosynthesis at [facility] 12/27/2015. This found the breast density to be category C. There was a new oval mass in the right breast upper outer quadrant. Ultrasound the same day confirmed a 2.2 cm mass with irregular margins in the right breast at the 9:00 position read as highly suggestive of malignancy.  Biopsy of the right breast mass in question 01/02/2016 found ([id number]) an invasive ductal carcinoma, grade 1, estrogen receptor 100% positive, progesterone receptor 90% positive, both with strong staining intensity, with an MIB-1 of 10%, and no HER-2 amplification, the signals ratio being 1.32 and the number per cell 2.05. The patient was informed and instructed to stop hormone replacement therapy (last dose 01/04/2016).  Her subsequent history is as detailed below.    PAST MEDICAL HISTORY: Past Medical History:  Diagnosis Date  . Abnormal CT of the chest 12/06   numeroous lung nodules - most have resolved.  . Arthritis    psoriatic arthritis- toes & feet, but resolved at this point- no need meds at this point    . Cancer (  [place])    right breast cancer  . Complication of anesthesia    /w colonoscopy- woke up before procedure was complete  . Family history of adverse reaction to anesthesia    pt's mother reported difficulty being given " enough " medicine to outcome a successful anesth. experience  . Family history  of leukemia   . Family history of ovarian cancer   . History of meningioma 2007   in 2013 stable on MRI and recheck in 5 years, no surgery necessry   . HSV infection 1970's- 1980's  . Psoriatic arthritis ([place])     PAST SURGICAL HISTORY: Past Surgical History:  Procedure Laterality Date  . BILATERAL TOTAL MASTECTOMY WITH AXILLARY LYMPH NODE DISSECTION Bilateral 01/04/2018   Procedure: BILATERAL TOTAL MASTECTOMY PROPHYLATIC RIGHT  WITH LEFT SENTINEAL  LYMPH NODE;  Surgeon: Samantha Seltzer, MD;  Location: [facility];  Service: General;  Laterality: Bilateral;  . BREAST LUMPECTOMY WITH RADIOACTIVE SEED AND SENTINEL LYMPH NODE BIOPSY Right 01/22/2016   Procedure: RIGHT BREAST LUMPECTOMY WITH RADIOACTIVE SEED AND SENTINEL LYMPH NODE BIOPSY;  Surgeon: Samantha Seltzer, MD;  Location: [facility];  Service: General;  Laterality: Right;  . BREAST SURGERY Left 1971, 2016   lumpectomy benign, 2016- [facility]- R lumpectomy & radiation   . COLONOSCOPY  8/06   normal and recheck in 10 years  . HAMMER TOE SURGERY Left 2016  . MASTECTOMY Right 01/04/2018   PROPHYLATIC   . MASTECTOMY COMPLETE / SIMPLE W/ SENTINEL NODE BIOPSY Left 01/04/2018  . PORTACATH PLACEMENT Right 01/04/2018   Procedure: INSERTION PORT-A-CATH;  Surgeon: Samantha Seltzer, MD;  Location: [facility];  Service: General;  Laterality: Right;  . THYROIDECTOMY, PARTIAL Left 1983    FAMILY HISTORY Family History  Problem Relation Age of Onset  . Osteoporosis Mother   . Osteoporosis Sister   . Lung cancer Maternal Aunt        smoker  . Heart Problems Maternal Grandmother   . COPD Maternal Grandfather   . Neurodegenerative disease Maternal Aunt        corical-ganglion degeneration  . Ovarian cancer Maternal Aunt        dx in her 44s  . Leukemia Cousin        1s-40s; maternal cousin  Samantha Sullivan has essentially no information on her father's side of the family aside from the fact that he died in his 22s from heart disease. He had  psoriasis. The patient's mother passed away 2017-04-10 age 44, due to breast cancer. The patient had 2 brothers who died from congenital heart disease shortly after birth. The patient has one sister. The patient's mother had one sister, the patient's aunt, diagnosed with ovarian cancer in her 31s.  GYNECOLOGIC HISTORY:  Patient's last menstrual period was 03/22/2000 (approximate). Menarche age 77, first live birth age 26. The patient is GX P2. She went through menopause approximately 1990 but was taking hormone replacement until 01/04/2016. Since coming off replacement she has had insomnia but no hot flashes or vaginal dryness pleural bones so 4. 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 She is divorced and lives alone, with no pets. Her daughter [name] lives in [place]. She is disabled secondary to schizoaffective disorder. Daughter [name] lives in [place] and she is a Estate agent but mostly a homemaker. The patient has 3 grandchildren, the  older 70, the other 2 being under 83 years old. The patient attends [employer]. Her mother passed away in 2017/04/02 due to breast cancer.    ADVANCED DIRECTIVES: her daughter [name] is her healthcare power of attorney. She can be reached at [place]: Social History   Tobacco Use  . Smoking status: Former Smoker    Packs/day: 1.00    Years: 12.00    Pack years: 12.00    Types: Cigarettes    Last attempt to quit: 07/23/1981    Years since quitting: 36.9  . Smokeless tobacco: Never Used  Substance Use Topics  . Alcohol use: Yes    Alcohol/week: 0.0 oz    Comment: social occasion only- special   . Drug use: No     Colonoscopy: ; [facility]  WPV:[id number] 2016/[facility]  Bone density:rJanuary 2018/normal   Lipid panel:  Allergies    Allergen Reactions  . Bee Venom   . Ciprofloxacin   . Levaquin [Levofloxacin In D5w]   . Norco [Hydrocodone-Acetaminophen] Other (See Comments)    Pt states Norco made her extremely hyper after her surgery and she was not able to sleep.    Current Outpatient Medications  Medication Sig Dispense Refill  . folic acid (FOLVITE) 1 MG tablet Take 2 mg by mouth every evening.    . lidocaine-prilocaine (EMLA) cream Apply to affected area once 30 g 3  . LORazepam (ATIVAN) 0.5 MG tablet Take 1 tablet (0.5 mg total) by mouth at bedtime as needed and may repeat dose one time if needed (Nausea or vomiting). 10 tablet 0  . Polyethyl Glycol-Propyl Glycol (LUBRICANT EYE DROPS) 0.4-0.3 % SOLN Place 1-2 drops into both eyes 3 (three) times daily as needed (dry eyes/irritated eyes.).    Marland Kitchen prochlorperazine (COMPAZINE) 10 MG tablet Take 1 tablet (10 mg total) by mouth every 6 (six) hours as needed (Nausea or vomiting). 30 tablet 1   Current Facility-Administered Medications  Medication Dose Route Frequency Provider Last Rate Last Dose  . triamcinolone acetonide (KENALOG) 10 MG/ML injection 10 mg  10 mg Other Once Wallene Huh, Samantha Sullivan        OBJECTIVE: Middle-aged white woman who appears well  Vitals:   06/29/18 0902  BP: (!) 142/82  Sullivan: 60  Resp: 18  Temp: 98.4 F (36.9 C)  SpO2: 100%     Body mass index is 28.26 kg/m.   ECOG FS:1 - Symptomatic but completely ambulatory  Sclerae unicteric, EOMs intact Oropharynx clear and moist No cervical or supraclavicular adenopathy Lungs no rales or rhonchi Heart regular rate and rhythm Abd soft, nontender, positive bowel sounds MSK no focal spinal tenderness, no upper extremity lymphedema Neuro: nonfocal, well oriented, appropriate affect Breasts: Deferred   LAB RESULTS:  CMP     Component Value Date/Time   NA 139 06/15/2018 1207   NA 141 08/05/2017 1335   K 4.1 06/15/2018 1207   K 4.5 08/05/2017 1335   CL 105 06/15/2018 1207   CO2 27  06/15/2018 1207   CO2 29 08/05/2017 1335   GLUCOSE 85 06/15/2018 1207   GLUCOSE 77 08/05/2017 1335   BUN 9 06/15/2018 1207   BUN 13.4 08/05/2017 1335   CREATININE 0.55 06/15/2018 1207   CREATININE 0.8 08/05/2017 1335   CALCIUM 9.2 06/15/2018 1207   CALCIUM 10.0 08/05/2017 1335   PROT 6.1 (L) 06/15/2018 1207   PROT 6.8 08/05/2017 1335   ALBUMIN 3.6 06/15/2018 1207   ALBUMIN 3.6 08/05/2017 1335  AST 30 06/15/2018 1207   AST 19 08/05/2017 1335   ALT 31 06/15/2018 1207   ALT 12 08/05/2017 1335   ALKPHOS 195 (H) 06/15/2018 1207   ALKPHOS 169 (H) 08/05/2017 1335   BILITOT 0.3 06/15/2018 1207   BILITOT 0.63 08/05/2017 1335   GFRNONAA >60 06/15/2018 1207   GFRAA >60 06/15/2018 1207    INo results found for: SPEP, UPEP  Lab Results  Component Value Date   WBC 6.7 06/29/2018   NEUTROABS 4.7 06/29/2018   HGB 10.8 (L) 06/29/2018   HCT 32.2 (L) 06/29/2018   MCV 100.9 06/29/2018   PLT 278 06/29/2018      Chemistry      Component Value Date/Time   NA 139 06/15/2018 1207   NA 141 08/05/2017 1335   K 4.1 06/15/2018 1207   K 4.5 08/05/2017 1335   CL 105 06/15/2018 1207   CO2 27 06/15/2018 1207   CO2 29 08/05/2017 1335   BUN 9 06/15/2018 1207   BUN 13.4 08/05/2017 1335   CREATININE 0.55 06/15/2018 1207   CREATININE 0.8 08/05/2017 1335      Component Value Date/Time   CALCIUM 9.2 06/15/2018 1207   CALCIUM 10.0 08/05/2017 1335   ALKPHOS 195 (H) 06/15/2018 1207   ALKPHOS 169 (H) 08/05/2017 1335   AST 30 06/15/2018 1207   AST 19 08/05/2017 1335   ALT 31 06/15/2018 1207   ALT 12 08/05/2017 1335   BILITOT 0.3 06/15/2018 1207   BILITOT 0.63 08/05/2017 1335       No results found for: LABCA2  No components found for: LABCA125  No results for input(s): INR in the last 168 hours.  Urinalysis    Component Value Date/Time   BILIRUBINUR neg 12/11/2015 0925   PROTEINUR neg 12/11/2015 0925   UROBILINOGEN negative 12/11/2015 0925   NITRITE neg 12/11/2015 0925    LEUKOCYTESUR Negative 12/11/2015 0925    STUDIES: No results found.  RESEARCH: enrolled in UPBEAT study; not a candidate for BR 003 because of concurrent cancer  ASSESSMENT: 67 y.o. [place] woman status post right breast upper outer quadrant biopsy 01/02/2016 for a clinical T2 N0, stage IIA invasive ductal carcinoma, grade 1, estrogen and progesterone receptor positive, HER-2 not amplified, with an MIB-1 of 10%.  (1). Right lumpectomy and sentinel lymph node sampling 01/22/2016 showed a pT1c pN0, stage IA invasive ductal carcinoma, grade 1, with close but negative margins. Repeat HER-2 testing was again not amplified   (3) Oncotype score of 18 predicts a risk of recurrence outside the breast within the next 10 years of 11% if the patient's only systemic therapy is tamoxifen for 5 years. It also predicts no significant benefit from chemotherapy.     (4) adjuvant radiation3/14/17-4/11/17 Site/dose:   Right breast - 42.72 gy at 2.67 Gy per fraction x 16 fractions followed by an electron boost to the right breast with 10 Gy at 2 Gy per fraction x 5 fractions.    (5)  Anastrozole started 04/21/2016, discontinued after approximately 1 month with significant side effects  (a)  Bone density at [facility] September 2013 was normal  (b) bone density January 2018 was normal  (c) anastrozole restarted February 2018, held again at the start of chemotherapy February 2019  (6) genetics testing  02/24/2016 through the Breast/Ovarian cancer gene panel offered by [facility] found no deleterious mutations in ATM, BARD1, BRCA1, BRCA2, BRIP1, CDH1, CHEK2, EPCAM, FANCC, MLH1, MSH2, MSH6, NBN, PALB2, PMS2, PTEN, RAD51C, RAD51D, TP53, and XRCC2.   (  7) left breast biopsy 12/17/2017 showed a cT2 cN0, stage IIA- IIB invasive ductal carcinoma, grade 2, estrogen receptor weakly positive, progesterone receptor negative, with HER-2 equivocal by FISH, negative by immunohistochemistry  (8) status post bilateral  mastectomies and left sentinel lymph node sampling 01/04/2018 showing  (a) on the right, no evidence of disease  (b) on the left, a pT2 pN0, stage IIB invasive ductal carcinoma, grade 3, triple negative  (9) adjuvant chemotherapy consisting of cyclophosphamide and doxorubicin in dose dense fashion x4 started 02/16/2018, completed 03/30/2018,  followed by weekly paclitaxel /carboplatin x12 starting 04/13/2018;  (a) Paclitaxel/Carboplatin stopped after 3 cycles due to peripheral neuropathy.  (b)  Gemcitabine Carboplatin started on 5/21, but held x 3 weeks due to prolonged neutropenia  (c) carboplatin and gemcitabine given day 1 and day 8 with OnPro support for the remaining cycles    PLAN:  Samantha Sullivan is tolerating her chemotherapy generally well except for the nails, and that of course is more likely to be due to the earlier Taxol treatments than to the current chemotherapy.  Because of cytopenias she is getting treated days 1 and 8 of each 21-day cycle and receives OnPro on day 9.  Accordingly she cannot be treated on day 15.  Accordingly we are canceling treatment on 07/20/2018.  I have entered her treatments supportive shots and visits through mid August.  Her final treatment will be an outlier (day 1 only, no day 8).  The plan is to continue through 7 more doses of chemo counting today.  She knows to call for any other issues that may develop before the next visit.   Kester Stimpson, Samantha Dad, MD  06/29/18 9:28 AM Medical Oncology and Hematology [facility] [address] [place], [place] [address] Tel. [phone]    Fax. [phone]  I, Margit Banda am acting as a scribe for Chauncey Cruel, MD.   I, Lurline Del MD, have reviewed the above documentation for accuracy and completeness, and I agree with the above.

## 2018-06-29 ENCOUNTER — Inpatient Hospital Stay (HOSPITAL_BASED_OUTPATIENT_CLINIC_OR_DEPARTMENT_OTHER): Payer: PPO | Admitting: Oncology

## 2018-06-29 ENCOUNTER — Inpatient Hospital Stay: Payer: PPO | Attending: Oncology

## 2018-06-29 ENCOUNTER — Telehealth: Payer: Self-pay | Admitting: Oncology

## 2018-06-29 ENCOUNTER — Inpatient Hospital Stay: Payer: PPO

## 2018-06-29 VITALS — BP 142/82 | HR 60 | Temp 98.4°F | Resp 18 | Ht 65.0 in | Wt 169.8 lb

## 2018-06-29 DIAGNOSIS — Z5111 Encounter for antineoplastic chemotherapy: Secondary | ICD-10-CM | POA: Diagnosis not present

## 2018-06-29 DIAGNOSIS — Z87891 Personal history of nicotine dependence: Secondary | ICD-10-CM | POA: Diagnosis not present

## 2018-06-29 DIAGNOSIS — Z801 Family history of malignant neoplasm of trachea, bronchus and lung: Secondary | ICD-10-CM

## 2018-06-29 DIAGNOSIS — Z8042 Family history of malignant neoplasm of prostate: Secondary | ICD-10-CM | POA: Diagnosis not present

## 2018-06-29 DIAGNOSIS — C50912 Malignant neoplasm of unspecified site of left female breast: Secondary | ICD-10-CM

## 2018-06-29 DIAGNOSIS — L609 Nail disorder, unspecified: Secondary | ICD-10-CM | POA: Diagnosis not present

## 2018-06-29 DIAGNOSIS — Z170 Estrogen receptor positive status [ER+]: Secondary | ICD-10-CM | POA: Diagnosis not present

## 2018-06-29 DIAGNOSIS — Z171 Estrogen receptor negative status [ER-]: Secondary | ICD-10-CM | POA: Diagnosis not present

## 2018-06-29 DIAGNOSIS — Z8041 Family history of malignant neoplasm of ovary: Secondary | ICD-10-CM

## 2018-06-29 DIAGNOSIS — C50411 Malignant neoplasm of upper-outer quadrant of right female breast: Secondary | ICD-10-CM

## 2018-06-29 DIAGNOSIS — Z923 Personal history of irradiation: Secondary | ICD-10-CM

## 2018-06-29 DIAGNOSIS — Z9013 Acquired absence of bilateral breasts and nipples: Secondary | ICD-10-CM | POA: Insufficient documentation

## 2018-06-29 DIAGNOSIS — C50812 Malignant neoplasm of overlapping sites of left female breast: Secondary | ICD-10-CM

## 2018-06-29 DIAGNOSIS — Z95828 Presence of other vascular implants and grafts: Secondary | ICD-10-CM

## 2018-06-29 DIAGNOSIS — D759 Disease of blood and blood-forming organs, unspecified: Secondary | ICD-10-CM

## 2018-06-29 DIAGNOSIS — C9012 Plasma cell leukemia in relapse: Secondary | ICD-10-CM | POA: Diagnosis not present

## 2018-06-29 DIAGNOSIS — Z5189 Encounter for other specified aftercare: Secondary | ICD-10-CM | POA: Insufficient documentation

## 2018-06-29 LAB — CBC WITH DIFFERENTIAL (CANCER CENTER ONLY)
BASOS PCT: 1 %
Basophils Absolute: 0 10*3/uL (ref 0.0–0.1)
EOS ABS: 0 10*3/uL (ref 0.0–0.5)
EOS PCT: 1 %
HCT: 32.2 % — ABNORMAL LOW (ref 34.8–46.6)
HEMOGLOBIN: 10.8 g/dL — AB (ref 11.6–15.9)
LYMPHS ABS: 1 10*3/uL (ref 0.9–3.3)
Lymphocytes Relative: 15 %
MCH: 33.9 pg (ref 25.1–34.0)
MCHC: 33.6 g/dL (ref 31.5–36.0)
MCV: 100.9 fL (ref 79.5–101.0)
Monocytes Absolute: 0.9 10*3/uL (ref 0.1–0.9)
Monocytes Relative: 13 %
NEUTROS PCT: 70 %
Neutro Abs: 4.7 10*3/uL (ref 1.5–6.5)
PLATELETS: 278 10*3/uL (ref 145–400)
RBC: 3.19 MIL/uL — AB (ref 3.70–5.45)
RDW: 19.2 % — ABNORMAL HIGH (ref 11.2–14.5)
WBC: 6.7 10*3/uL (ref 3.9–10.3)

## 2018-06-29 LAB — CMP (CANCER CENTER ONLY)
ALK PHOS: 238 U/L — AB (ref 38–126)
ALT: 14 U/L (ref 0–44)
ANION GAP: 7 (ref 5–15)
AST: 17 U/L (ref 15–41)
Albumin: 4 g/dL (ref 3.5–5.0)
BUN: 12 mg/dL (ref 8–23)
CHLORIDE: 106 mmol/L (ref 98–111)
CO2: 26 mmol/L (ref 22–32)
Calcium: 9.4 mg/dL (ref 8.9–10.3)
Creatinine: 0.64 mg/dL (ref 0.44–1.00)
GFR, Estimated: >60 mL/min (ref >60)
Glucose, Bld: 91 mg/dL (ref 70–99)
Potassium: 4 mmol/L (ref 3.5–5.1)
SODIUM: 139 mmol/L (ref 135–145)
Total Bilirubin: <0.2 mg/dL — ABNORMAL LOW (ref 0.3–1.2)
Total Protein: 6.2 g/dL — ABNORMAL LOW (ref 6.5–8.1)

## 2018-06-29 MED ORDER — SODIUM CHLORIDE 0.9 % IV SOLN
152.3200 mg | Freq: Once | INTRAVENOUS | Status: AC
  Administered 2018-06-29: 150 mg via INTRAVENOUS
  Filled 2018-06-29: qty 15

## 2018-06-29 MED ORDER — PALONOSETRON HCL INJECTION 0.25 MG/5ML
0.2500 mg | Freq: Once | INTRAVENOUS | Status: AC
  Administered 2018-06-29: 0.25 mg via INTRAVENOUS

## 2018-06-29 MED ORDER — SODIUM CHLORIDE 0.9% FLUSH
10.0000 mL | INTRAVENOUS | Status: DC | PRN
  Administered 2018-06-29: 10 mL
  Filled 2018-06-29: qty 10

## 2018-06-29 MED ORDER — DEXAMETHASONE SODIUM PHOSPHATE 10 MG/ML IJ SOLN
4.0000 mg | Freq: Once | INTRAMUSCULAR | Status: AC
  Administered 2018-06-29: 4 mg via INTRAVENOUS

## 2018-06-29 MED ORDER — PALONOSETRON HCL INJECTION 0.25 MG/5ML
INTRAVENOUS | Status: AC
  Filled 2018-06-29: qty 5

## 2018-06-29 MED ORDER — HEPARIN SOD (PORK) LOCK FLUSH 100 UNIT/ML IV SOLN
500.0000 [IU] | Freq: Once | INTRAVENOUS | Status: AC | PRN
  Administered 2018-06-29: 500 [IU]
  Filled 2018-06-29: qty 5

## 2018-06-29 MED ORDER — FAMOTIDINE IN NACL 20-0.9 MG/50ML-% IV SOLN
20.0000 mg | Freq: Once | INTRAVENOUS | Status: AC
  Administered 2018-06-29: 20 mg via INTRAVENOUS

## 2018-06-29 MED ORDER — DEXAMETHASONE SODIUM PHOSPHATE 10 MG/ML IJ SOLN
INTRAMUSCULAR | Status: AC
  Filled 2018-06-29: qty 1

## 2018-06-29 MED ORDER — SODIUM CHLORIDE 0.9 % IV SOLN
Freq: Once | INTRAVENOUS | Status: AC
  Administered 2018-06-29: 10:00:00 via INTRAVENOUS

## 2018-06-29 MED ORDER — FAMOTIDINE IN NACL 20-0.9 MG/50ML-% IV SOLN
INTRAVENOUS | Status: AC
  Filled 2018-06-29: qty 50

## 2018-06-29 MED ORDER — SODIUM CHLORIDE 0.9 % IV SOLN
640.0000 mg/m2 | Freq: Once | INTRAVENOUS | Status: AC
  Administered 2018-06-29: 1254 mg via INTRAVENOUS
  Filled 2018-06-29: qty 32.98

## 2018-06-29 MED ORDER — DIPHENHYDRAMINE HCL 50 MG/ML IJ SOLN
25.0000 mg | Freq: Once | INTRAMUSCULAR | Status: AC
Start: 2018-06-29 — End: 2018-06-29
  Administered 2018-06-29: 25 mg via INTRAVENOUS

## 2018-06-29 MED ORDER — ZOLPIDEM TARTRATE 5 MG PO TABS: 5.0000 mg | ORAL_TABLET | Freq: Every evening | ORAL | 0 refills | Status: DC | PRN

## 2018-06-29 MED ORDER — DIPHENHYDRAMINE HCL 50 MG/ML IJ SOLN
INTRAMUSCULAR | Status: AC
  Filled 2018-06-29: qty 1

## 2018-06-29 NOTE — Telephone Encounter (Signed)
Patient scheduled per provider request. Patients schedule was updated per GM.

## 2018-06-29 NOTE — Patient Instructions (Signed)
[facility] Discharge Instructions for Patients Receiving Chemotherapy  Today you received the following chemotherapy agents:  Gemzar, Carboplatin  To help prevent nausea and vomiting after your treatment, we encourage you to take your nausea medication as prescribed.   If you develop nausea and vomiting that is not controlled by your nausea medication, call the clinic.   BELOW ARE SYMPTOMS THAT SHOULD BE REPORTED IMMEDIATELY:  *FEVER GREATER THAN 100.5 F  *CHILLS WITH OR WITHOUT FEVER  NAUSEA AND VOMITING THAT IS NOT CONTROLLED WITH YOUR NAUSEA MEDICATION  *UNUSUAL SHORTNESS OF BREATH  *UNUSUAL BRUISING OR BLEEDING  TENDERNESS IN MOUTH AND THROAT WITH OR WITHOUT PRESENCE OF ULCERS  *URINARY PROBLEMS  *BOWEL PROBLEMS  UNUSUAL RASH Items with * indicate a potential emergency and should be followed up as soon as possible.  Feel free to call the clinic should you have any questions or concerns. The clinic phone number is [phone].  Please show the CHEMO ALERT CARD at check-in to the Emergency Department and triage nurse.   

## 2018-06-30 ENCOUNTER — Inpatient Hospital Stay: Payer: PPO

## 2018-06-30 DIAGNOSIS — Z5111 Encounter for antineoplastic chemotherapy: Secondary | ICD-10-CM | POA: Diagnosis not present

## 2018-06-30 DIAGNOSIS — Z95828 Presence of other vascular implants and grafts: Secondary | ICD-10-CM

## 2018-06-30 MED ORDER — TBO-FILGRASTIM 480 MCG/0.8ML ~~LOC~~ SOSY
480.0000 ug | PREFILLED_SYRINGE | Freq: Once | SUBCUTANEOUS | Status: AC
  Administered 2018-06-30: 480 ug via SUBCUTANEOUS

## 2018-06-30 MED ORDER — TBO-FILGRASTIM 480 MCG/0.8ML ~~LOC~~ SOSY
PREFILLED_SYRINGE | SUBCUTANEOUS | Status: AC
  Filled 2018-06-30: qty 0.8

## 2018-07-01 ENCOUNTER — Encounter: Payer: Self-pay | Admitting: Oncology

## 2018-07-01 ENCOUNTER — Inpatient Hospital Stay: Payer: PPO

## 2018-07-01 VITALS — BP 133/70 | HR 80 | Temp 98.6°F | Resp 18

## 2018-07-01 DIAGNOSIS — Z95828 Presence of other vascular implants and grafts: Secondary | ICD-10-CM

## 2018-07-01 DIAGNOSIS — Z5111 Encounter for antineoplastic chemotherapy: Secondary | ICD-10-CM | POA: Diagnosis not present

## 2018-07-01 MED ORDER — TBO-FILGRASTIM 480 MCG/0.8ML ~~LOC~~ SOSY
480.0000 ug | PREFILLED_SYRINGE | Freq: Once | SUBCUTANEOUS | Status: AC
  Administered 2018-07-01: 480 ug via SUBCUTANEOUS

## 2018-07-01 NOTE — Progress Notes (Signed)
Received PA request for Zolpidem Tartrate 5mg .  Called [facility](Sandra) to initiate PA as urgent. Answered questions. She states it will be submitted for review and will receive notification via fax. [id number].

## 2018-07-01 NOTE — Patient Instructions (Signed)
Tbo-Filgrastim injection What is this medicine? TBO-FILGRASTIM (T B O fil GRA stim) is a granulocyte colony-stimulating factor that stimulates the growth of neutrophils, a type of white blood cell important in the body's fight against infection. It is used to reduce the incidence of fever and infection in patients with certain types of cancer who are receiving chemotherapy that affects the bone marrow. This medicine may be used for other purposes; ask your health care provider or pharmacist if you have questions. COMMON BRAND NAME(S): Granix What should I tell my health care provider before I take this medicine? They need to know if you have any of these conditions: -bone scan or tests planned -kidney disease -sickle cell anemia -an unusual or allergic reaction to tbo-filgrastim, filgrastim, pegfilgrastim, other medicines, foods, dyes, or preservatives -pregnant or trying to get pregnant -breast-feeding How should I use this medicine? This medicine is for injection under the skin. If you get this medicine at home, you will be taught how to prepare and give this medicine. Refer to the Instructions for Use that come with your medication packaging. Use exactly as directed. Take your medicine at regular intervals. Do not take your medicine more often than directed. It is important that you put your used needles and syringes in a special sharps container. Do not put them in a trash can. If you do not have a sharps container, call your pharmacist or healthcare provider to get one. Talk to your pediatrician regarding the use of this medicine in children. Special care may be needed. Overdosage: If you think you have taken too much of this medicine contact a poison control center or emergency room at once. NOTE: This medicine is only for you. Do not share this medicine with others. What if I miss a dose? It is important not to miss your dose. Call your doctor or health care professional if you miss a  dose. What may interact with this medicine? This medicine may interact with the following medications: -medicines that may cause a release of neutrophils, such as lithium This list may not describe all possible interactions. Give your health care provider a list of all the medicines, herbs, non-prescription drugs, or dietary supplements you use. Also tell them if you smoke, drink alcohol, or use illegal drugs. Some items may interact with your medicine. What should I watch for while using this medicine? You may need blood work done while you are taking this medicine. What side effects may I notice from receiving this medicine? Side effects that you should report to your doctor or health care professional as soon as possible: -allergic reactions like skin rash, itching or hives, swelling of the face, lips, or tongue -blood in the urine -dark urine -dizziness -fast heartbeat -feeling faint -shortness of breath or breathing problems -signs and symptoms of infection like fever or chills; cough; or sore throat -signs and symptoms of kidney injury like trouble passing urine or change in the amount of urine -stomach or side pain, or pain at the shoulder -sweating -swelling of the legs, ankles, or abdomen -tiredness Side effects that usually do not require medical attention (report to your doctor or health care professional if they continue or are bothersome): -bone pain -headache -muscle pain -vomiting This list may not describe all possible side effects. Call your doctor for medical advice about side effects. You may report side effects to FDA at 1-800-FDA-1088. Where should I keep my medicine? Keep out of the reach of children. Store in a refrigerator between   2 and 8 degrees C (36 and 46 degrees F). Keep in carton to protect from light. Throw away this medicine if it is left out of the refrigerator for more than 5 consecutive days. Throw away any unused medicine after the expiration  date. NOTE: This sheet is a summary. It may not cover all possible information. If you have questions about this medicine, talk to your doctor, pharmacist, or health care provider.  2018 [employer]vier/Gold Standard (2016-01-28 19:07:04)  

## 2018-07-01 NOTE — Progress Notes (Signed)
Received PA determination for Zolpidem Tartrate from [employer].  PA approved 07/01/18 - 12/21/18.

## 2018-07-02 ENCOUNTER — Inpatient Hospital Stay: Payer: PPO

## 2018-07-02 VITALS — BP 131/75 | HR 79 | Temp 98.2°F | Resp 18

## 2018-07-02 DIAGNOSIS — Z95828 Presence of other vascular implants and grafts: Secondary | ICD-10-CM

## 2018-07-02 DIAGNOSIS — Z5111 Encounter for antineoplastic chemotherapy: Secondary | ICD-10-CM | POA: Diagnosis not present

## 2018-07-02 MED ORDER — TBO-FILGRASTIM 480 MCG/0.8ML ~~LOC~~ SOSY
480.0000 ug | PREFILLED_SYRINGE | Freq: Once | SUBCUTANEOUS | Status: AC
  Administered 2018-07-02: 480 ug via SUBCUTANEOUS

## 2018-07-02 MED ORDER — TBO-FILGRASTIM 480 MCG/0.8ML ~~LOC~~ SOSY
PREFILLED_SYRINGE | SUBCUTANEOUS | Status: AC
  Filled 2018-07-02: qty 0.8

## 2018-07-06 ENCOUNTER — Inpatient Hospital Stay: Payer: PPO

## 2018-07-06 ENCOUNTER — Inpatient Hospital Stay (HOSPITAL_BASED_OUTPATIENT_CLINIC_OR_DEPARTMENT_OTHER): Payer: PPO | Admitting: Adult Health

## 2018-07-06 ENCOUNTER — Encounter: Payer: Self-pay | Admitting: Adult Health

## 2018-07-06 VITALS — BP 143/91 | HR 64 | Temp 98.3°F | Resp 18 | Ht 65.0 in | Wt 169.2 lb

## 2018-07-06 DIAGNOSIS — C50812 Malignant neoplasm of overlapping sites of left female breast: Secondary | ICD-10-CM

## 2018-07-06 DIAGNOSIS — Z170 Estrogen receptor positive status [ER+]: Principal | ICD-10-CM

## 2018-07-06 DIAGNOSIS — C50411 Malignant neoplasm of upper-outer quadrant of right female breast: Secondary | ICD-10-CM

## 2018-07-06 DIAGNOSIS — Z95828 Presence of other vascular implants and grafts: Secondary | ICD-10-CM

## 2018-07-06 DIAGNOSIS — Z5111 Encounter for antineoplastic chemotherapy: Secondary | ICD-10-CM | POA: Diagnosis not present

## 2018-07-06 LAB — CMP (CANCER CENTER ONLY)
ALBUMIN: 3.9 g/dL (ref 3.5–5.0)
ALT: 23 U/L (ref 0–44)
ANION GAP: 5 (ref 5–15)
AST: 27 U/L (ref 15–41)
Alkaline Phosphatase: 261 U/L — ABNORMAL HIGH (ref 38–126)
BILIRUBIN TOTAL: 0.3 mg/dL (ref 0.3–1.2)
BUN: 10 mg/dL (ref 8–23)
CO2: 28 mmol/L (ref 22–32)
Calcium: 9.2 mg/dL (ref 8.9–10.3)
Chloride: 106 mmol/L (ref 98–111)
Creatinine: 0.64 mg/dL (ref 0.44–1.00)
GFR, Est AFR Am: >60 mL/min (ref >60)
GLUCOSE: 85 mg/dL (ref 70–99)
POTASSIUM: 4.2 mmol/L (ref 3.5–5.1)
Sodium: 139 mmol/L (ref 135–145)
TOTAL PROTEIN: 6.2 g/dL — AB (ref 6.5–8.1)

## 2018-07-06 LAB — CBC WITH DIFFERENTIAL (CANCER CENTER ONLY)
Basophils Absolute: 0 10*3/uL (ref 0.0–0.1)
Basophils Relative: 1 %
Eosinophils Absolute: 0 10*3/uL (ref 0.0–0.5)
Eosinophils Relative: 0 %
HEMATOCRIT: 31.8 % — AB (ref 34.8–46.6)
HEMOGLOBIN: 10.7 g/dL — AB (ref 11.6–15.9)
LYMPHS PCT: 25 %
Lymphs Abs: 1 10*3/uL (ref 0.9–3.3)
MCH: 33.7 pg (ref 25.1–34.0)
MCHC: 33.8 g/dL (ref 31.5–36.0)
MCV: 99.7 fL (ref 79.5–101.0)
MONOS PCT: 20 %
Monocytes Absolute: 0.8 10*3/uL (ref 0.1–0.9)
NEUTROS ABS: 2.2 10*3/uL (ref 1.5–6.5)
NEUTROS PCT: 54 %
PLATELETS: 268 10*3/uL (ref 145–400)
RBC: 3.19 MIL/uL — ABNORMAL LOW (ref 3.70–5.45)
RDW: 18.3 % — ABNORMAL HIGH (ref 11.2–14.5)
WBC Count: 4 10*3/uL (ref 3.9–10.3)

## 2018-07-06 MED ORDER — FAMOTIDINE IN NACL 20-0.9 MG/50ML-% IV SOLN
INTRAVENOUS | Status: AC
  Filled 2018-07-06: qty 50

## 2018-07-06 MED ORDER — SODIUM CHLORIDE 0.9% FLUSH
10.0000 mL | INTRAVENOUS | Status: DC | PRN
  Administered 2018-07-06: 10 mL
  Filled 2018-07-06: qty 10

## 2018-07-06 MED ORDER — DIPHENHYDRAMINE HCL 50 MG/ML IJ SOLN
INTRAMUSCULAR | Status: AC
  Filled 2018-07-06: qty 1

## 2018-07-06 MED ORDER — PEGFILGRASTIM 6 MG/0.6ML ~~LOC~~ PSKT
PREFILLED_SYRINGE | SUBCUTANEOUS | Status: AC
  Filled 2018-07-06: qty 0.6

## 2018-07-06 MED ORDER — DEXAMETHASONE SODIUM PHOSPHATE 10 MG/ML IJ SOLN
INTRAMUSCULAR | Status: AC
  Filled 2018-07-06: qty 1

## 2018-07-06 MED ORDER — HEPARIN SOD (PORK) LOCK FLUSH 100 UNIT/ML IV SOLN
500.0000 [IU] | Freq: Once | INTRAVENOUS | Status: AC | PRN
  Administered 2018-07-06: 500 [IU]
  Filled 2018-07-06: qty 5

## 2018-07-06 MED ORDER — FAMOTIDINE IN NACL 20-0.9 MG/50ML-% IV SOLN
20.0000 mg | Freq: Once | INTRAVENOUS | Status: AC
  Administered 2018-07-06: 20 mg via INTRAVENOUS

## 2018-07-06 MED ORDER — SODIUM CHLORIDE 0.9 % IV SOLN
640.0000 mg/m2 | Freq: Once | INTRAVENOUS | Status: AC
  Administered 2018-07-06: 1254 mg via INTRAVENOUS
  Filled 2018-07-06: qty 32.98

## 2018-07-06 MED ORDER — PALONOSETRON HCL INJECTION 0.25 MG/5ML
INTRAVENOUS | Status: AC
  Filled 2018-07-06: qty 5

## 2018-07-06 MED ORDER — PEGFILGRASTIM 6 MG/0.6ML ~~LOC~~ PSKT
6.0000 mg | PREFILLED_SYRINGE | Freq: Once | SUBCUTANEOUS | Status: AC
  Administered 2018-07-06: 6 mg via SUBCUTANEOUS

## 2018-07-06 MED ORDER — DIPHENHYDRAMINE HCL 50 MG/ML IJ SOLN
25.0000 mg | Freq: Once | INTRAMUSCULAR | Status: AC
  Administered 2018-07-06: 25 mg via INTRAVENOUS

## 2018-07-06 MED ORDER — SODIUM CHLORIDE 0.9 % IV SOLN
Freq: Once | INTRAVENOUS | Status: AC
  Administered 2018-07-06: 14:00:00 via INTRAVENOUS

## 2018-07-06 MED ORDER — SODIUM CHLORIDE 0.9 % IV SOLN
152.3200 mg | Freq: Once | INTRAVENOUS | Status: AC
  Administered 2018-07-06: 150 mg via INTRAVENOUS
  Filled 2018-07-06: qty 15

## 2018-07-06 MED ORDER — PALONOSETRON HCL INJECTION 0.25 MG/5ML
0.2500 mg | Freq: Once | INTRAVENOUS | Status: AC
  Administered 2018-07-06: 0.25 mg via INTRAVENOUS

## 2018-07-06 MED ORDER — DEXAMETHASONE SODIUM PHOSPHATE 10 MG/ML IJ SOLN
4.0000 mg | Freq: Once | INTRAMUSCULAR | Status: AC
  Administered 2018-07-06: 4 mg via INTRAVENOUS

## 2018-07-06 NOTE — Progress Notes (Signed)
[place]  [phone] [phone]     ID: Samantha Sullivan DOB: 09-21-1951  MR#: [id number]  QJF#:[id number]  Patient Care Team: Shon Baton, MD as PCP - General (Internal Medicine) Magrinat, Virgie Dad, MD as Consulting Physician (Oncology) Kem Boroughs, [place] as Nurse Practitioner (Family Medicine) Regal, Tamala Fothergill, DPM as Consulting Physician (Podiatry) Excell Seltzer, MD as Consulting Physician (General Surgery) Force, Shari Prows, DO as Referring Physician (Student) PCP: Shon Baton, MD OTHER MD:  CHIEF COMPLAINT: Contralateral breast cancer, triple negative  CURRENT TREATMENT: Adjuvant chemotherapy  INTERVAL HISTORY: Samantha Sullivan "Samantha Sullivan" returns today for follow-up and treatment of her contralateral breast cancer, triple negative.   The patient continues on carboplatin and gemcitabine, which she now receives days 1 and 8 of each 21-day cycle, with Granix on days 2,  3 and 4 and Neulasta/OnPro on day 9.  Today is day 1 of the current cycle-- but a better way to count is that today is the 7th of 12 planned doses of carbo-associated chemo.   REVIEW OF SYSTEMS: Samantha Sullivan is doing well today.  She recently traveled with her family and had a very good time.  She denies any peripheral neuropathy at this point.  She did lose a fingernail.  Otherwise, she is feeling well.  She denies fevers, chills, nausea, vomiting, constipation, diarrhea, chest pain, palpitations, cough, shortness of breath, headaches, vision issues, or any other concerns.    RIGHT BREAST CANCER HISTORY:  From the original intake note:  "Samantha Sullivan"noted a lump in her right breast sometime in late October or early November, but there were "so many things going on in her life" she did not bring it to medical attention until she saw Samantha Sullivan 12/11/2015. Samantha Sullivan was able to palpate a mass at the 9:30 o'clock position in the right breast, which was mobile and nontender. The left breast  is status post prior remote biopsy and there was some scar tissue at the 7:00 position. The patient was set up for bilateral diagnostic Mammography with tomosynthesis at [facility] 12/27/2015. This found the breast density to be category C. There was a new oval mass in the right breast upper outer quadrant. Ultrasound the same day confirmed a 2.2 cm mass with irregular margins in the right breast at the 9:00 position read as highly suggestive of malignancy.  Biopsy of the right breast mass in question 01/02/2016 found ([id number]) an invasive ductal carcinoma, grade 1, estrogen receptor 100% positive, progesterone receptor 90% positive, both with strong staining intensity, with an MIB-1 of 10%, and no HER-2 amplification, the signals ratio being 1.32 and the number per cell 2.05. The patient was informed and instructed to stop hormone replacement therapy (last dose 01/04/2016).  Her subsequent history is as detailed below.    PAST MEDICAL HISTORY: Past Medical History:  Diagnosis Date  . Abnormal CT of the chest 12/06   numeroous lung nodules - most have resolved.  . Arthritis    psoriatic arthritis- toes & feet, but resolved at this point- no need meds at this point    . Cancer [facility])    right breast cancer  . Complication of anesthesia    /w colonoscopy- woke up before procedure was complete  . Family history of adverse reaction to anesthesia    pt's mother reported difficulty being given " enough " medicine to outcome a successful anesth. experience  . Family history of leukemia   . Family history of ovarian cancer   . History  of meningioma 03/07/06   in 03-07-2012 stable on MRI and recheck in 5 years, no surgery necessry   . HSV infection 1970's- 1980's  . Psoriatic arthritis ([place])     PAST SURGICAL HISTORY: Past Surgical History:  Procedure Laterality Date  . BILATERAL TOTAL MASTECTOMY WITH AXILLARY LYMPH NODE DISSECTION Bilateral 01/04/2018   Procedure: BILATERAL TOTAL MASTECTOMY PROPHYLATIC  RIGHT  WITH LEFT SENTINEAL  LYMPH NODE;  Surgeon: Excell Seltzer, MD;  Location: [place];  Service: General;  Laterality: Bilateral;  . BREAST LUMPECTOMY WITH RADIOACTIVE SEED AND SENTINEL LYMPH NODE BIOPSY Right 01/22/2016   Procedure: RIGHT BREAST LUMPECTOMY WITH RADIOACTIVE SEED AND SENTINEL LYMPH NODE BIOPSY;  Surgeon: Excell Seltzer, MD;  Location: [place];  Service: General;  Laterality: Right;  . BREAST SURGERY Left 1971, March 08, 2015   lumpectomy benign, 03-08-15- Br. Ca- R lumpectomy & radiation   . COLONOSCOPY  8/06   normal and recheck in 10 years  . HAMMER TOE SURGERY Left 2015-03-08  . MASTECTOMY Right 01/04/2018   PROPHYLATIC   . MASTECTOMY COMPLETE / SIMPLE W/ SENTINEL NODE BIOPSY Left 01/04/2018  . PORTACATH PLACEMENT Right 01/04/2018   Procedure: INSERTION PORT-A-CATH;  Surgeon: Excell Seltzer, MD;  Location: [facility];  Service: General;  Laterality: Right;  . THYROIDECTOMY, PARTIAL Left 1983    FAMILY HISTORY Family History  Problem Relation Age of Onset  . Osteoporosis Mother   . Osteoporosis Sister   . Lung cancer Maternal Aunt        smoker  . Heart Problems Maternal Grandmother   . COPD Maternal Grandfather   . Neurodegenerative disease Maternal Aunt        corical-ganglion degeneration  . Ovarian cancer Maternal Aunt        dx in her 72s  . Leukemia Cousin        14s-40s; maternal cousin  Samantha Sullivan has essentially no information on her father's side of the family aside from the fact that he died in his 30s from heart disease. He had psoriasis. The patient's mother passed away 2017-03-07 age 41, due to breast cancer. The patient had 2 brothers who died from congenital heart disease shortly after birth. The patient has one sister. The patient's mother had one sister, the patient's aunt, diagnosed with ovarian cancer in her 64s.  GYNECOLOGIC HISTORY:  Patient's last menstrual period was 03/22/2000 (approximate). Menarche age 87, first live birth age 9. The  patient is GX P2. She went through menopause approximately 1989-03-07 but was taking hormone replacement until 01/04/2016. Since coming off replacement she has had insomnia but no hot flashes or vaginal dryness pleural bones so 4. 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 She is divorced and lives alone, with no pets. Her daughter [name] lives in [place]. She is disabled secondary to schizoaffective disorder. Daughter [name] lives in [place] and she is a Estate agent but mostly a homemaker. The patient has 3 grandchildren, the older 47, the other 2 being under 46 years old. The patient attends [employer]. Her mother passed away in 03/07/2017 due to breast cancer.    ADVANCED DIRECTIVES: her daughter [name] is her healthcare power of attorney. She can be reached at [place]: Social History   Tobacco Use  . Smoking status: Former Smoker    Packs/day: 1.00  Years: 12.00    Pack years: 12.00    Types: Cigarettes    Last attempt to quit: 07/23/1981    Years since quitting: 36.9  . Smokeless tobacco: Never Used  Substance Use Topics  . Alcohol use: Yes    Alcohol/week: 0.0 oz    Comment: social occasion only- special   . Drug use: No     Colonoscopy: ; [place]  ZOX:WRUEAVW 2016/[place]  Bone density:rJanuary 2018/normal   Lipid panel:  Allergies  Allergen Reactions  . Bee Venom   . Ciprofloxacin   . Levaquin [Levofloxacin In D5w]   . Norco [Hydrocodone-Acetaminophen] Other (See Comments)    Pt states Norco made her extremely hyper after her surgery and she was not able to sleep.    Current Outpatient Medications  Medication Sig Dispense Refill  . folic acid (FOLVITE) 1 MG tablet Take 2 mg by mouth every evening.    . lidocaine-prilocaine (EMLA) cream Apply to  affected area once 30 g 3  . Polyethyl Glycol-Propyl Glycol (LUBRICANT EYE DROPS) 0.4-0.3 % SOLN Place 1-2 drops into both eyes 3 (three) times daily as needed (dry eyes/irritated eyes.).    Marland Kitchen prochlorperazine (COMPAZINE) 10 MG tablet Take 1 tablet (10 mg total) by mouth every 6 (six) hours as needed (Nausea or vomiting). 30 tablet 1  . zolpidem (AMBIEN) 5 MG tablet Take 1-2 tablets (5-10 mg total) by mouth at bedtime as needed for sleep. 30 tablet 0   Current Facility-Administered Medications  Medication Dose Route Frequency Provider Last Rate Last Dose  . triamcinolone acetonide (KENALOG) 10 MG/ML injection 10 mg  10 mg Other Once Wallene Huh, DPM        OBJECTIVE:  Vitals:   07/06/18 1300  BP: (!) 143/91  Sullivan: 64  Resp: 18  Temp: 98.3 F (36.8 C)  SpO2: 100%     Body mass index is 28.16 kg/m.   ECOG FS:1 - Symptomatic but completely ambulatory GENERAL: Patient is a well appearing female in no acute distress HEENT:  Sclerae anicteric.  Oropharynx clear and moist. No ulcerations or evidence of oropharyngeal candidiasis. Neck is supple.  NODES:  No cervical, supraclavicular, or axillary lymphadenopathy palpated.  BREAST EXAM:  Deferred. LUNGS:  Clear to auscultation bilaterally.  No wheezes or rhonchi. HEART:  Regular rate and rhythm. No murmur appreciated. ABDOMEN:  Soft, nontender.  Positive, normoactive bowel sounds. No organomegaly palpated. MSK:  No focal spinal tenderness to palpation. Full range of motion bilaterally in the upper extremities. EXTREMITIES:  No peripheral edema.   SKIN:  Clear with no obvious rashes or skin changes. No nail dyscrasia. NEURO:  Nonfocal. Well oriented.  Appropriate affect.     LAB RESULTS:  CMP     Component Value Date/Time   NA 139 07/06/2018 1156   NA 141 08/05/2017 1335   K 4.2 07/06/2018 1156   K 4.5 08/05/2017 1335   CL 106 07/06/2018 1156   CO2 28 07/06/2018 1156   CO2 29 08/05/2017 1335   GLUCOSE 85 07/06/2018 1156    GLUCOSE 77 08/05/2017 1335   BUN 10 07/06/2018 1156   BUN 13.4 08/05/2017 1335   CREATININE 0.64 07/06/2018 1156   CREATININE 0.8 08/05/2017 1335   CALCIUM 9.2 07/06/2018 1156   CALCIUM 10.0 08/05/2017 1335   PROT 6.2 (L) 07/06/2018 1156   PROT 6.8 08/05/2017 1335   ALBUMIN 3.9 07/06/2018 1156   ALBUMIN 3.6 08/05/2017 1335   AST 27 07/06/2018 1156   AST 19 08/05/2017  1335   ALT 23 07/06/2018 1156   ALT 12 08/05/2017 1335   ALKPHOS 261 (H) 07/06/2018 1156   ALKPHOS 169 (H) 08/05/2017 1335   BILITOT 0.3 07/06/2018 1156   BILITOT 0.63 08/05/2017 1335   GFRNONAA >60 07/06/2018 1156   GFRAA >60 07/06/2018 1156    INo results found for: SPEP, UPEP  Lab Results  Component Value Date   WBC 4.0 07/06/2018   NEUTROABS 2.2 07/06/2018   HGB 10.7 (L) 07/06/2018   HCT 31.8 (L) 07/06/2018   MCV 99.7 07/06/2018   PLT 268 07/06/2018      Chemistry      Component Value Date/Time   NA 139 07/06/2018 1156   NA 141 08/05/2017 1335   K 4.2 07/06/2018 1156   K 4.5 08/05/2017 1335   CL 106 07/06/2018 1156   CO2 28 07/06/2018 1156   CO2 29 08/05/2017 1335   BUN 10 07/06/2018 1156   BUN 13.4 08/05/2017 1335   CREATININE 0.64 07/06/2018 1156   CREATININE 0.8 08/05/2017 1335      Component Value Date/Time   CALCIUM 9.2 07/06/2018 1156   CALCIUM 10.0 08/05/2017 1335   ALKPHOS 261 (H) 07/06/2018 1156   ALKPHOS 169 (H) 08/05/2017 1335   AST 27 07/06/2018 1156   AST 19 08/05/2017 1335   ALT 23 07/06/2018 1156   ALT 12 08/05/2017 1335   BILITOT 0.3 07/06/2018 1156   BILITOT 0.63 08/05/2017 1335       No results found for: LABCA2  No components found for: LABCA125  No results for input(s): INR in the last 168 hours.  Urinalysis    Component Value Date/Time   BILIRUBINUR neg 12/11/2015 0925   PROTEINUR neg 12/11/2015 0925   UROBILINOGEN negative 12/11/2015 0925   NITRITE neg 12/11/2015 0925   LEUKOCYTESUR Negative 12/11/2015 0925    STUDIES: No results  found.  RESEARCH: enrolled in UPBEAT study; not a candidate for BR 003 because of concurrent cancer  ASSESSMENT: 67 y.o. [place] woman status post right breast upper outer quadrant biopsy 01/02/2016 for a clinical T2 N0, stage IIA invasive ductal carcinoma, grade 1, estrogen and progesterone receptor positive, HER-2 not amplified, with an MIB-1 of 10%.  (1). Right lumpectomy and sentinel lymph node sampling 01/22/2016 showed a pT1c pN0, stage IA invasive ductal carcinoma, grade 1, with close but negative margins. Repeat HER-2 testing was again not amplified   (3) Oncotype score of 18 predicts a risk of recurrence outside the breast within the next 10 years of 11% if the patient's only systemic therapy is tamoxifen for 5 years. It also predicts no significant benefit from chemotherapy.     (4) adjuvant radiation3/14/17-4/11/17 Site/dose:   Right breast - 42.72 gy at 2.67 Gy per fraction x 16 fractions followed by an electron boost to the right breast with 10 Gy at 2 Gy per fraction x 5 fractions.    (5)  Anastrozole started 04/21/2016, discontinued after approximately 1 month with significant side effects  (a)  [facility] at [facility] 2013 was normal  (b) bone density January 2018 was normal  (c) anastrozole restarted February 2018, held again at the start of chemotherapy February 2019  (6) genetics testing  02/24/2016 through the Breast/Ovarian cancer gene panel offered by [facility] found no deleterious mutations in ATM, BARD1, BRCA1, BRCA2, BRIP1, CDH1, CHEK2, EPCAM, FANCC, MLH1, MSH2, MSH6, NBN, PALB2, PMS2, PTEN, RAD51C, RAD51D, TP53, and XRCC2.   (7) left breast biopsy 12/17/2017 showed a cT2 cN0,  stage IIA- IIB invasive ductal carcinoma, grade 2, estrogen receptor weakly positive, progesterone receptor negative, with HER-2 equivocal by FISH, negative by immunohistochemistry  (8) status post bilateral mastectomies and left sentinel lymph node sampling 01/04/2018  showing  (a) on the right, no evidence of disease  (b) on the left, a pT2 pN0, stage IIB invasive ductal carcinoma, grade 3, triple negative  (9) adjuvant chemotherapy consisting of cyclophosphamide and doxorubicin in dose dense fashion x4 started 02/16/2018, completed 03/30/2018,  followed by weekly paclitaxel /carboplatin x12 starting 04/13/2018;  (a) Paclitaxel/Carboplatin stopped after 3 cycles due to peripheral neuropathy.  (b)  Gemcitabine Carboplatin started on 5/21, but held x 3 weeks due to prolonged neutropenia  (c) carboplatin and gemcitabine given day 1 and day 8 with OnPro support for the remaining cycles    PLAN:  Samantha Sullivan is doing well today.  Her cbc remains stable.  I reviewed it with her in detail.  She will proceed with treatment today with Gemcitabine/carboplatin.  She will receive onpro today and will take a week off.  She will return on 07/20/2018 for labs, f/u and her next treatment.  She knows to call for any other issues that may develop before the next visit.  A total of (20) minutes of face-to-face time was spent with this patient with greater than 50% of that time in counseling and care-coordination.    Wilber Bihari, NP  07/06/18 1:14 PM Medical Oncology and Hematology [facility] [address] [place], [place] [address] Tel. [phone]    Fax. [phone]

## 2018-07-06 NOTE — Patient Instructions (Signed)
[facility] Discharge Instructions for Patients Receiving Chemotherapy  Today you received the following chemotherapy agents:  gemcitabine (Gemzar) and carboplatin (Paraplatin)  To help prevent nausea and vomiting after your treatment, we encourage you to take your nausea medication as prescribed.   If you develop nausea and vomiting that is not controlled by your nausea medication, call the clinic.   BELOW ARE SYMPTOMS THAT SHOULD BE REPORTED IMMEDIATELY:  *FEVER GREATER THAN 100.5 F  *CHILLS WITH OR WITHOUT FEVER  NAUSEA AND VOMITING THAT IS NOT CONTROLLED WITH YOUR NAUSEA MEDICATION  *UNUSUAL SHORTNESS OF BREATH  *UNUSUAL BRUISING OR BLEEDING  TENDERNESS IN MOUTH AND THROAT WITH OR WITHOUT PRESENCE OF ULCERS  *URINARY PROBLEMS  *BOWEL PROBLEMS  UNUSUAL RASH Items with * indicate a potential emergency and should be followed up as soon as possible.  Feel free to call the clinic should you have any questions or concerns. The clinic phone number is [phone].  Please show the [place] at check-in to the Emergency Department and triage nurse.

## 2018-07-07 ENCOUNTER — Telehealth: Payer: Self-pay | Admitting: Adult Health

## 2018-07-07 NOTE — Telephone Encounter (Signed)
Per 7/16 no los °

## 2018-07-12 ENCOUNTER — Other Ambulatory Visit: Payer: PPO

## 2018-07-12 ENCOUNTER — Ambulatory Visit: Payer: PPO | Admitting: Adult Health

## 2018-07-13 ENCOUNTER — Ambulatory Visit: Payer: PPO

## 2018-07-14 ENCOUNTER — Ambulatory Visit: Payer: PPO

## 2018-07-15 ENCOUNTER — Ambulatory Visit: Payer: PPO

## 2018-07-16 ENCOUNTER — Ambulatory Visit: Payer: PPO

## 2018-07-20 ENCOUNTER — Encounter: Payer: Self-pay | Admitting: Adult Health

## 2018-07-20 ENCOUNTER — Inpatient Hospital Stay: Payer: PPO

## 2018-07-20 ENCOUNTER — Telehealth: Payer: Self-pay | Admitting: Adult Health

## 2018-07-20 ENCOUNTER — Other Ambulatory Visit: Payer: PPO

## 2018-07-20 ENCOUNTER — Other Ambulatory Visit: Payer: Self-pay | Admitting: Hematology and Oncology

## 2018-07-20 ENCOUNTER — Inpatient Hospital Stay (HOSPITAL_BASED_OUTPATIENT_CLINIC_OR_DEPARTMENT_OTHER): Payer: PPO | Admitting: Adult Health

## 2018-07-20 ENCOUNTER — Ambulatory Visit: Payer: PPO

## 2018-07-20 ENCOUNTER — Ambulatory Visit: Payer: PPO | Admitting: Adult Health

## 2018-07-20 VITALS — BP 146/75 | HR 99 | Temp 97.9°F | Resp 18 | Ht 65.0 in | Wt 170.9 lb

## 2018-07-20 DIAGNOSIS — C50812 Malignant neoplasm of overlapping sites of left female breast: Secondary | ICD-10-CM

## 2018-07-20 DIAGNOSIS — Z9013 Acquired absence of bilateral breasts and nipples: Secondary | ICD-10-CM

## 2018-07-20 DIAGNOSIS — Z79811 Long term (current) use of aromatase inhibitors: Secondary | ICD-10-CM | POA: Diagnosis not present

## 2018-07-20 DIAGNOSIS — Z170 Estrogen receptor positive status [ER+]: Secondary | ICD-10-CM | POA: Diagnosis not present

## 2018-07-20 DIAGNOSIS — Z95828 Presence of other vascular implants and grafts: Secondary | ICD-10-CM

## 2018-07-20 DIAGNOSIS — C50912 Malignant neoplasm of unspecified site of left female breast: Secondary | ICD-10-CM | POA: Diagnosis not present

## 2018-07-20 DIAGNOSIS — Z5111 Encounter for antineoplastic chemotherapy: Secondary | ICD-10-CM | POA: Diagnosis not present

## 2018-07-20 DIAGNOSIS — Z87891 Personal history of nicotine dependence: Secondary | ICD-10-CM

## 2018-07-20 DIAGNOSIS — C50411 Malignant neoplasm of upper-outer quadrant of right female breast: Secondary | ICD-10-CM

## 2018-07-20 DIAGNOSIS — Z923 Personal history of irradiation: Secondary | ICD-10-CM | POA: Diagnosis not present

## 2018-07-20 LAB — CBC WITH DIFFERENTIAL (CANCER CENTER ONLY)
BASOS ABS: 0 10*3/uL (ref 0.0–0.1)
BASOS PCT: 1 %
EOS ABS: 0 10*3/uL (ref 0.0–0.5)
EOS PCT: 1 %
HCT: 29.6 % — ABNORMAL LOW (ref 34.8–46.6)
Hemoglobin: 9.8 g/dL — ABNORMAL LOW (ref 11.6–15.9)
Lymphocytes Relative: 18 %
Lymphs Abs: 0.7 10*3/uL — ABNORMAL LOW (ref 0.9–3.3)
MCH: 33.7 pg (ref 25.1–34.0)
MCHC: 33.2 g/dL (ref 31.5–36.0)
MCV: 101.4 fL — ABNORMAL HIGH (ref 79.5–101.0)
Monocytes Absolute: 0.6 10*3/uL (ref 0.1–0.9)
Monocytes Relative: 16 %
NEUTROS PCT: 64 %
Neutro Abs: 2.4 10*3/uL (ref 1.5–6.5)
PLATELETS: 164 10*3/uL (ref 145–400)
RBC: 2.92 MIL/uL — AB (ref 3.70–5.45)
RDW: 18.3 % — ABNORMAL HIGH (ref 11.2–14.5)
WBC: 3.7 10*3/uL — AB (ref 3.9–10.3)

## 2018-07-20 LAB — CMP (CANCER CENTER ONLY)
ALBUMIN: 3.7 g/dL (ref 3.5–5.0)
ALT: 11 U/L (ref 0–44)
ANION GAP: 5 (ref 5–15)
AST: 16 U/L (ref 15–41)
Alkaline Phosphatase: 215 U/L — ABNORMAL HIGH (ref 38–126)
BUN: 11 mg/dL (ref 8–23)
CHLORIDE: 109 mmol/L (ref 98–111)
CO2: 28 mmol/L (ref 22–32)
CREATININE: 0.68 mg/dL (ref 0.44–1.00)
Calcium: 9 mg/dL (ref 8.9–10.3)
GFR, Est AFR Am: >60 mL/min (ref >60)
GFR, Estimated: >60 mL/min (ref >60)
GLUCOSE: 89 mg/dL (ref 70–99)
Potassium: 4 mmol/L (ref 3.5–5.1)
SODIUM: 142 mmol/L (ref 135–145)
Total Bilirubin: 0.3 mg/dL (ref 0.3–1.2)
Total Protein: 5.9 g/dL — ABNORMAL LOW (ref 6.5–8.1)

## 2018-07-20 MED ORDER — HEPARIN SOD (PORK) LOCK FLUSH 100 UNIT/ML IV SOLN
500.0000 [IU] | Freq: Once | INTRAVENOUS | Status: AC | PRN
  Administered 2018-07-20: 500 [IU]
  Filled 2018-07-20: qty 5

## 2018-07-20 MED ORDER — SODIUM CHLORIDE 0.9 % IV SOLN
Freq: Once | INTRAVENOUS | Status: AC
  Administered 2018-07-20: 14:00:00 via INTRAVENOUS
  Filled 2018-07-20: qty 250

## 2018-07-20 MED ORDER — DEXAMETHASONE SODIUM PHOSPHATE 10 MG/ML IJ SOLN
INTRAMUSCULAR | Status: AC
Start: 2018-07-20 — End: ?
  Filled 2018-07-20: qty 1

## 2018-07-20 MED ORDER — SODIUM CHLORIDE 0.9 % IV SOLN
152.3200 mg | Freq: Once | INTRAVENOUS | Status: AC
  Administered 2018-07-20: 150 mg via INTRAVENOUS
  Filled 2018-07-20: qty 15

## 2018-07-20 MED ORDER — DEXAMETHASONE SODIUM PHOSPHATE 10 MG/ML IJ SOLN
4.0000 mg | Freq: Once | INTRAMUSCULAR | Status: AC
  Administered 2018-07-20: 4 mg via INTRAVENOUS

## 2018-07-20 MED ORDER — DIPHENHYDRAMINE HCL 50 MG/ML IJ SOLN
25.0000 mg | Freq: Once | INTRAMUSCULAR | Status: AC
  Administered 2018-07-20: 25 mg via INTRAVENOUS

## 2018-07-20 MED ORDER — DIPHENHYDRAMINE HCL 50 MG/ML IJ SOLN
INTRAMUSCULAR | Status: AC
  Filled 2018-07-20: qty 1

## 2018-07-20 MED ORDER — FAMOTIDINE IN NACL 20-0.9 MG/50ML-% IV SOLN
INTRAVENOUS | Status: AC
  Filled 2018-07-20: qty 50

## 2018-07-20 MED ORDER — PALONOSETRON HCL INJECTION 0.25 MG/5ML
INTRAVENOUS | Status: AC
Start: 2018-07-20 — End: ?
  Filled 2018-07-20: qty 5

## 2018-07-20 MED ORDER — SODIUM CHLORIDE 0.9% FLUSH
10.0000 mL | INTRAVENOUS | Status: DC | PRN
  Administered 2018-07-20: 10 mL
  Filled 2018-07-20: qty 10

## 2018-07-20 MED ORDER — FAMOTIDINE IN NACL 20-0.9 MG/50ML-% IV SOLN
20.0000 mg | Freq: Once | INTRAVENOUS | Status: AC
  Administered 2018-07-20: 20 mg via INTRAVENOUS

## 2018-07-20 MED ORDER — PALONOSETRON HCL INJECTION 0.25 MG/5ML
0.2500 mg | Freq: Once | INTRAVENOUS | Status: AC
  Administered 2018-07-20: 0.25 mg via INTRAVENOUS

## 2018-07-20 MED ORDER — SODIUM CHLORIDE 0.9 % IV SOLN
640.0000 mg/m2 | Freq: Once | INTRAVENOUS | Status: AC
  Administered 2018-07-20: 1254 mg via INTRAVENOUS
  Filled 2018-07-20: qty 32.98

## 2018-07-20 MED ORDER — SODIUM CHLORIDE 0.9% FLUSH
10.0000 mL | INTRAVENOUS | Status: DC | PRN
  Filled 2018-07-20: qty 10

## 2018-07-20 NOTE — Patient Instructions (Signed)
[facility] Discharge Instructions for Patients Receiving Chemotherapy  Today you received the following chemotherapy agents:  Gemzar, Carboplatin  To help prevent nausea and vomiting after your treatment, we encourage you to take your nausea medication as prescribed.   If you develop nausea and vomiting that is not controlled by your nausea medication, call the clinic.   BELOW ARE SYMPTOMS THAT SHOULD BE REPORTED IMMEDIATELY:  *FEVER GREATER THAN 100.5 F  *CHILLS WITH OR WITHOUT FEVER  NAUSEA AND VOMITING THAT IS NOT CONTROLLED WITH YOUR NAUSEA MEDICATION  *UNUSUAL SHORTNESS OF BREATH  *UNUSUAL BRUISING OR BLEEDING  TENDERNESS IN MOUTH AND THROAT WITH OR WITHOUT PRESENCE OF ULCERS  *URINARY PROBLEMS  *BOWEL PROBLEMS  UNUSUAL RASH Items with * indicate a potential emergency and should be followed up as soon as possible.  Feel free to call the clinic should you have any questions or concerns. The clinic phone number is [phone].  Please show the CHEMO ALERT CARD at check-in to the Emergency Department and triage nurse.   

## 2018-07-20 NOTE — Telephone Encounter (Signed)
No 7/30 los orders or referrals.

## 2018-07-20 NOTE — Progress Notes (Signed)
[place]  [phone] [phone]     ID: Samantha Sullivan DOB: 1951/07/24  MR#: [id number]  JYN#:[id number]  Patient Care Team: Shon Baton, MD as PCP - General (Internal Medicine) Magrinat, Virgie Dad, MD as Consulting Physician (Oncology) Kem Boroughs, Marvell as Nurse Practitioner (Family Medicine) Regal, Tamala Fothergill, DPM as Consulting Physician (Podiatry) Excell Seltzer, MD as Consulting Physician (General Surgery) Force, Shari Prows, DO as Referring Physician (Student) PCP: Shon Baton, MD OTHER MD:  CHIEF COMPLAINT: Contralateral breast cancer, triple negative  CURRENT TREATMENT: Adjuvant chemotherapy  INTERVAL HISTORY: Samantha Sullivan "Samantha Sullivan" returns today for follow-up and treatment of her contralateral breast cancer, triple negative.   The patient continues on carboplatin and gemcitabine, which she now receives days 1 and 8 of each 21-day cycle, with Granix on days 2,  3 and 4 and Neulasta/OnPro on day 9.  Today is day 1 of the current cycle-- but a better way to count is that today is the 8th of 12 planned doses of carbo-associated chemo.   REVIEW OF SYSTEMS: Samantha Sullivan is doing well today. She has had a good two weeks off.  She denies any fevers, chills, headaches, vision change, appetite loss, chest pain, shortness of breath, cough, palpitations, nausea, vomiting, abdominal pain, constipation, diarrhea, or any other concerns.  A detailed ROS was non contributory today.    RIGHT BREAST CANCER HISTORY:  From the original intake note:  "Samantha Sullivan"noted a lump in her right breast sometime in late October or early November, but there were "so many things going on in her life" she did not bring it to medical attention until she saw [name] 12/11/2015. Samantha Sullivan was able to palpate a mass at the 9:30 o'clock position in the right breast, which was mobile and nontender. The left breast is status post prior remote biopsy and there was some scar tissue  at the 7:00 position. The patient was set up for bilateral diagnostic Mammography with tomosynthesis at [facility] 12/27/2015. This found the breast density to be category C. There was a new oval mass in the right breast upper outer quadrant. Ultrasound the same day confirmed a 2.2 cm mass with irregular margins in the right breast at the 9:00 position read as highly suggestive of malignancy.  Biopsy of the right breast mass in question 01/02/2016 found ([id number]) an invasive ductal carcinoma, grade 1, estrogen receptor 100% positive, progesterone receptor 90% positive, both with strong staining intensity, with an MIB-1 of 10%, and no HER-2 amplification, the signals ratio being 1.32 and the number per cell 2.05. The patient was informed and instructed to stop hormone replacement therapy (last dose 01/04/2016).  Her subsequent history is as detailed below.    PAST MEDICAL HISTORY: Past Medical History:  Diagnosis Date  . Abnormal CT of the chest 12/06   numeroous lung nodules - most have resolved.  . Arthritis    psoriatic arthritis- toes & feet, but resolved at this point- no need meds at this point    . Cancer [facility])    right breast cancer  . Complication of anesthesia    /w colonoscopy- woke up before procedure was complete  . Family history of adverse reaction to anesthesia    pt's mother reported difficulty being given " enough " medicine to outcome a successful anesth. experience  . Family history of leukemia   . Family history of ovarian cancer   . History of meningioma 2007   in 2013 stable on MRI and recheck in  5 years, no surgery necessry   . HSV infection 1970's- 1980's  . Psoriatic arthritis ([place])     PAST SURGICAL HISTORY: Past Surgical History:  Procedure Laterality Date  . BILATERAL TOTAL MASTECTOMY WITH AXILLARY LYMPH NODE DISSECTION Bilateral 01/04/2018   Procedure: BILATERAL TOTAL MASTECTOMY PROPHYLATIC RIGHT  WITH LEFT SENTINEAL  LYMPH NODE;  Surgeon: Excell Seltzer,  MD;  Location: [place];  Service: General;  Laterality: Bilateral;  . BREAST LUMPECTOMY WITH RADIOACTIVE SEED AND SENTINEL LYMPH NODE BIOPSY Right 01/22/2016   Procedure: RIGHT BREAST LUMPECTOMY WITH RADIOACTIVE SEED AND SENTINEL LYMPH NODE BIOPSY;  Surgeon: Excell Seltzer, MD;  Location: [place];  Service: General;  Laterality: Right;  . BREAST SURGERY Left 1971, 2016   lumpectomy benign, 2016- Br. Ca- R lumpectomy & radiation   . COLONOSCOPY  8/06   normal and recheck in 10 years  . HAMMER TOE SURGERY Left 2016  . MASTECTOMY Right 01/04/2018   PROPHYLATIC   . MASTECTOMY COMPLETE / SIMPLE W/ SENTINEL NODE BIOPSY Left 01/04/2018  . PORTACATH PLACEMENT Right 01/04/2018   Procedure: INSERTION PORT-A-CATH;  Surgeon: Excell Seltzer, MD;  Location: [place];  Service: General;  Laterality: Right;  . THYROIDECTOMY, PARTIAL Left 1983    FAMILY HISTORY Family History  Problem Relation Age of Onset  . Osteoporosis Mother   . Osteoporosis Sister   . Lung cancer Maternal Aunt        smoker  . Heart Problems Maternal Grandmother   . COPD Maternal Grandfather   . Neurodegenerative disease Maternal Aunt        corical-ganglion degeneration  . Ovarian cancer Maternal Aunt        dx in her 3s  . Leukemia Cousin        81s-40s; maternal cousin  Samantha Sullivan has essentially no information on her father's side of the family aside from the fact that he died in his 34s from heart disease. He had psoriasis. The patient's mother passed away 15-Mar-2017 age 10, due to breast cancer. The patient had 2 brothers who died from congenital heart disease shortly after birth. The patient has one sister. The patient's mother had one sister, the patient's aunt, diagnosed with ovarian cancer in her 47s.  GYNECOLOGIC HISTORY:  Patient's last menstrual period was 03/22/2000 (approximate). Menarche age 6, first live birth age 60. The patient is GX P2. She went through menopause approximately 1990 but was  taking hormone replacement until 01/04/2016. Since coming off replacement she has had insomnia but no hot flashes or vaginal dryness pleural bones so 4. 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 She is divorced and lives alone, with no pets. Her daughter Samantha Sullivan lives in [place]. She is disabled secondary to schizoaffective disorder. Daughter Samantha Sullivan lives in [place] and she is a Estate agent but mostly a homemaker. The patient has 3 grandchildren, the older 74, the other 2 being under 66 years old. The patient attends [employer]. Her mother passed away in 03/15/2017 due to breast cancer.    ADVANCED DIRECTIVES: her daughter [name] is her healthcare power of attorney. She can be reached at [place]: Social History   Tobacco Use  . Smoking status: Former Smoker    Packs/day: 1.00    Years: 12.00    Pack years: 12.00    Types:  Cigarettes    Last attempt to quit: 07/23/1981    Years since quitting: 37.0  . Smokeless tobacco: Never Used  Substance Use Topics  . Alcohol use: Yes    Alcohol/week: 0.0 oz    Comment: social occasion only- special   . Drug use: No     Colonoscopy: ; [place]  NAT:FTDDUKG 2016/[place]  Bone density:rJanuary 2018/normal   Lipid panel:  Allergies  Allergen Reactions  . Bee Venom   . Ciprofloxacin   . Levaquin [Levofloxacin In D5w]   . Norco [Hydrocodone-Acetaminophen] Other (See Comments)    Pt states Norco made her extremely hyper after her surgery and she was not able to sleep.    Current Outpatient Medications  Medication Sig Dispense Refill  . folic acid (FOLVITE) 1 MG tablet Take 2 mg by mouth every evening.    . lidocaine-prilocaine (EMLA) cream Apply to affected area once 30 g 3  . Polyethyl Glycol-Propyl Glycol (LUBRICANT EYE  DROPS) 0.4-0.3 % SOLN Place 1-2 drops into both eyes 3 (three) times daily as needed (dry eyes/irritated eyes.).    Marland Kitchen prochlorperazine (COMPAZINE) 10 MG tablet Take 1 tablet (10 mg total) by mouth every 6 (six) hours as needed (Nausea or vomiting). 30 tablet 1  . zolpidem (AMBIEN) 5 MG tablet Take 1-2 tablets (5-10 mg total) by mouth at bedtime as needed for sleep. 30 tablet 0   Current Facility-Administered Medications  Medication Dose Route Frequency Provider Last Rate Last Dose  . triamcinolone acetonide (KENALOG) 10 MG/ML injection 10 mg  10 mg Other Once Wallene Huh, DPM        OBJECTIVE:  Vitals:   07/20/18 1248  BP: (!) 146/75  Sullivan: 99  Resp: 18  Temp: 97.9 F (36.6 C)  SpO2: 99%     Body mass index is 28.44 kg/m.   ECOG FS:1 - Symptomatic but completely ambulatory GENERAL: Patient is a well appearing female in no acute distress HEENT:  Sclerae anicteric.  Oropharynx clear and moist. No ulcerations or evidence of oropharyngeal candidiasis. Neck is supple.  NODES:  No cervical, supraclavicular, or axillary lymphadenopathy palpated.  BREAST EXAM:  Deferred. LUNGS:  Clear to auscultation bilaterally.  No wheezes or rhonchi. HEART:  Regular rate and rhythm. No murmur appreciated. ABDOMEN:  Soft, nontender.  Positive, normoactive bowel sounds. No organomegaly palpated. MSK:  No focal spinal tenderness to palpation. Full range of motion bilaterally in the upper extremities. EXTREMITIES:  No peripheral edema.   SKIN:  Clear with no obvious rashes or skin changes. No nail dyscrasia. NEURO:  Nonfocal. Well oriented.  Appropriate affect.     LAB RESULTS:  CMP     Component Value Date/Time   NA 142 07/20/2018 1157   NA 141 08/05/2017 1335   K 4.0 07/20/2018 1157   K 4.5 08/05/2017 1335   CL 109 07/20/2018 1157   CO2 28 07/20/2018 1157   CO2 29 08/05/2017 1335   GLUCOSE 89 07/20/2018 1157   GLUCOSE 77 08/05/2017 1335   BUN 11 07/20/2018 1157   BUN 13.4 08/05/2017  1335   CREATININE 0.68 07/20/2018 1157   CREATININE 0.8 08/05/2017 1335   CALCIUM 9.0 07/20/2018 1157   CALCIUM 10.0 08/05/2017 1335   PROT 5.9 (L) 07/20/2018 1157   PROT 6.8 08/05/2017 1335   ALBUMIN 3.7 07/20/2018 1157   ALBUMIN 3.6 08/05/2017 1335   AST 16 07/20/2018 1157   AST 19 08/05/2017 1335   ALT 11 07/20/2018 1157   ALT 12 08/05/2017  1335   ALKPHOS 215 (H) 07/20/2018 1157   ALKPHOS 169 (H) 08/05/2017 1335   BILITOT 0.3 07/20/2018 1157   BILITOT 0.63 08/05/2017 1335   GFRNONAA >60 07/20/2018 1157   GFRAA >60 07/20/2018 1157    INo results found for: SPEP, UPEP  Lab Results  Component Value Date   WBC 3.7 (L) 07/20/2018   NEUTROABS 2.4 07/20/2018   HGB 9.8 (L) 07/20/2018   HCT 29.6 (L) 07/20/2018   MCV 101.4 (H) 07/20/2018   PLT 164 07/20/2018      Chemistry      Component Value Date/Time   NA 142 07/20/2018 1157   NA 141 08/05/2017 1335   K 4.0 07/20/2018 1157   K 4.5 08/05/2017 1335   CL 109 07/20/2018 1157   CO2 28 07/20/2018 1157   CO2 29 08/05/2017 1335   BUN 11 07/20/2018 1157   BUN 13.4 08/05/2017 1335   CREATININE 0.68 07/20/2018 1157   CREATININE 0.8 08/05/2017 1335      Component Value Date/Time   CALCIUM 9.0 07/20/2018 1157   CALCIUM 10.0 08/05/2017 1335   ALKPHOS 215 (H) 07/20/2018 1157   ALKPHOS 169 (H) 08/05/2017 1335   AST 16 07/20/2018 1157   AST 19 08/05/2017 1335   ALT 11 07/20/2018 1157   ALT 12 08/05/2017 1335   BILITOT 0.3 07/20/2018 1157   BILITOT 0.63 08/05/2017 1335       No results found for: LABCA2  No components found for: LABCA125  No results for input(s): INR in the last 168 hours.  Urinalysis    Component Value Date/Time   BILIRUBINUR neg 12/11/2015 0925   PROTEINUR neg 12/11/2015 0925   UROBILINOGEN negative 12/11/2015 0925   NITRITE neg 12/11/2015 0925   LEUKOCYTESUR Negative 12/11/2015 0925    STUDIES: No results found.  RESEARCH: enrolled in UPBEAT study; not a candidate for BR 003 because of  concurrent cancer  ASSESSMENT: 67 y.o. [place] woman status post right breast upper outer quadrant biopsy 01/02/2016 for a clinical T2 N0, stage IIA invasive ductal carcinoma, grade 1, estrogen and progesterone receptor positive, HER-2 not amplified, with an MIB-1 of 10%.  (1). Right lumpectomy and sentinel lymph node sampling 01/22/2016 showed a pT1c pN0, stage IA invasive ductal carcinoma, grade 1, with close but negative margins. Repeat HER-2 testing was again not amplified   (3) Oncotype score of 18 predicts a risk of recurrence outside the breast within the next 10 years of 11% if the patient's only systemic therapy is tamoxifen for 5 years. It also predicts no significant benefit from chemotherapy.     (4) adjuvant radiation3/14/17-4/11/17 Site/dose:   Right breast - 42.72 gy at 2.67 Gy per fraction x 16 fractions followed by an electron boost to the right breast with 10 Gy at 2 Gy per fraction x 5 fractions.    (5)  Anastrozole started 04/21/2016, discontinued after approximately 1 month with significant side effects  (a)  [facility] September 2013 was normal  (b) bone density January 2018 was normal  (c) anastrozole restarted February 2018, held again at the start of chemotherapy February 2019  (6) genetics testing  02/24/2016 through the Breast/Ovarian cancer gene panel offered by [facility] found no deleterious mutations in ATM, BARD1, BRCA1, BRCA2, BRIP1, CDH1, CHEK2, EPCAM, FANCC, MLH1, MSH2, MSH6, NBN, PALB2, PMS2, PTEN, RAD51C, RAD51D, TP53, and XRCC2.   (7) left breast biopsy 12/17/2017 showed a cT2 cN0, stage IIA- IIB invasive ductal carcinoma, grade 2, estrogen receptor  weakly positive, progesterone receptor negative, with HER-2 equivocal by FISH, negative by immunohistochemistry  (8) status post bilateral mastectomies and left sentinel lymph node sampling 01/04/2018 showing  (a) on the right, no evidence of disease  (b) on the left, a pT2 pN0, stage IIB  invasive ductal carcinoma, grade 3, triple negative  (9) adjuvant chemotherapy consisting of cyclophosphamide and doxorubicin in dose dense fashion x4 started 02/16/2018, completed 03/30/2018,  followed by weekly paclitaxel /carboplatin x12 starting 04/13/2018;  (a) Paclitaxel/Carboplatin stopped after 3 cycles due to peripheral neuropathy.  (b)  Gemcitabine Carboplatin started on 5/21, but held x 3 weeks due to prolonged neutropenia  (c) carboplatin and gemcitabine given day 1 and day 8 with OnPro support for the remaining cycles    PLAN:  Samantha Sullivan is doing well today.  She continues to tolerate treatment well and her labs have been stable with Granix and onpro support.  I reviewed her labs which are stable and she can proceed with chemotherapy today with Gemcitabine and Carboplatin.  She will receive three days of granix x 3 days in between day 1 and day 8 of her treatment. She receives Onpro on day 8.   Samantha Sullivan will return for three injections during the week, and then in one week for labs, f/u and day 8 of her treatment.  She knows to call between now and her next appointment for any questions or concerns.    A total of (20) minutes of face-to-face time was spent with this patient with greater than 50% of that time in counseling and care-coordination.    Wilber Bihari, NP  07/20/18 1:14 PM Medical Oncology and Hematology [facility] [address] [place], [place] [address] Tel. [phone]    Fax. [phone]

## 2018-07-21 ENCOUNTER — Inpatient Hospital Stay: Payer: PPO

## 2018-07-21 DIAGNOSIS — Z95828 Presence of other vascular implants and grafts: Secondary | ICD-10-CM

## 2018-07-21 DIAGNOSIS — Z5111 Encounter for antineoplastic chemotherapy: Secondary | ICD-10-CM | POA: Diagnosis not present

## 2018-07-21 MED ORDER — TBO-FILGRASTIM 480 MCG/0.8ML ~~LOC~~ SOSY
480.0000 ug | PREFILLED_SYRINGE | Freq: Once | SUBCUTANEOUS | Status: AC
  Administered 2018-07-21: 480 ug via SUBCUTANEOUS

## 2018-07-21 MED ORDER — TBO-FILGRASTIM 480 MCG/0.8ML ~~LOC~~ SOSY
PREFILLED_SYRINGE | SUBCUTANEOUS | Status: AC
  Filled 2018-07-21: qty 0.8

## 2018-07-22 ENCOUNTER — Inpatient Hospital Stay: Payer: PPO | Attending: Oncology

## 2018-07-22 DIAGNOSIS — Z923 Personal history of irradiation: Secondary | ICD-10-CM | POA: Insufficient documentation

## 2018-07-22 DIAGNOSIS — Z95828 Presence of other vascular implants and grafts: Secondary | ICD-10-CM

## 2018-07-22 DIAGNOSIS — D701 Agranulocytosis secondary to cancer chemotherapy: Secondary | ICD-10-CM | POA: Diagnosis not present

## 2018-07-22 DIAGNOSIS — Z9221 Personal history of antineoplastic chemotherapy: Secondary | ICD-10-CM | POA: Insufficient documentation

## 2018-07-22 DIAGNOSIS — C50912 Malignant neoplasm of unspecified site of left female breast: Secondary | ICD-10-CM | POA: Diagnosis not present

## 2018-07-22 DIAGNOSIS — C50411 Malignant neoplasm of upper-outer quadrant of right female breast: Secondary | ICD-10-CM | POA: Insufficient documentation

## 2018-07-22 DIAGNOSIS — Z5111 Encounter for antineoplastic chemotherapy: Secondary | ICD-10-CM | POA: Diagnosis not present

## 2018-07-22 DIAGNOSIS — Z5189 Encounter for other specified aftercare: Secondary | ICD-10-CM | POA: Diagnosis not present

## 2018-07-22 DIAGNOSIS — Z9013 Acquired absence of bilateral breasts and nipples: Secondary | ICD-10-CM | POA: Diagnosis not present

## 2018-07-22 MED ORDER — TBO-FILGRASTIM 480 MCG/0.8ML ~~LOC~~ SOSY
PREFILLED_SYRINGE | SUBCUTANEOUS | Status: AC
  Filled 2018-07-22: qty 0.8

## 2018-07-22 MED ORDER — TBO-FILGRASTIM 480 MCG/0.8ML ~~LOC~~ SOSY
480.0000 ug | PREFILLED_SYRINGE | Freq: Once | SUBCUTANEOUS | Status: AC
  Administered 2018-07-22: 480 ug via SUBCUTANEOUS

## 2018-07-22 NOTE — Patient Instructions (Signed)
Tbo-Filgrastim injection What is this medicine? TBO-FILGRASTIM (T B O fil GRA stim) is a granulocyte colony-stimulating factor that stimulates the growth of neutrophils, a type of white blood cell important in the body's fight against infection. It is used to reduce the incidence of fever and infection in patients with certain types of cancer who are receiving chemotherapy that affects the bone marrow. This medicine may be used for other purposes; ask your health care provider or pharmacist if you have questions. COMMON BRAND NAME(S): Granix What should I tell my health care provider before I take this medicine? They need to know if you have any of these conditions: -bone scan or tests planned -kidney disease -sickle cell anemia -an unusual or allergic reaction to tbo-filgrastim, filgrastim, pegfilgrastim, other medicines, foods, dyes, or preservatives -pregnant or trying to get pregnant -breast-feeding How should I use this medicine? This medicine is for injection under the skin. If you get this medicine at home, you will be taught how to prepare and give this medicine. Refer to the Instructions for Use that come with your medication packaging. Use exactly as directed. Take your medicine at regular intervals. Do not take your medicine more often than directed. It is important that you put your used needles and syringes in a special sharps container. Do not put them in a trash can. If you do not have a sharps container, call your pharmacist or healthcare provider to get one. Talk to your pediatrician regarding the use of this medicine in children. Special care may be needed. Overdosage: If you think you have taken too much of this medicine contact a poison control center or emergency room at once. NOTE: This medicine is only for you. Do not share this medicine with others. What if I miss a dose? It is important not to miss your dose. Call your doctor or health care professional if you miss a  dose. What may interact with this medicine? This medicine may interact with the following medications: -medicines that may cause a release of neutrophils, such as lithium This list may not describe all possible interactions. Give your health care provider a list of all the medicines, herbs, non-prescription drugs, or dietary supplements you use. Also tell them if you smoke, drink alcohol, or use illegal drugs. Some items may interact with your medicine. What should I watch for while using this medicine? You may need blood work done while you are taking this medicine. What side effects may I notice from receiving this medicine? Side effects that you should report to your doctor or health care professional as soon as possible: -allergic reactions like skin rash, itching or hives, swelling of the face, lips, or tongue -blood in the urine -dark urine -dizziness -fast heartbeat -feeling faint -shortness of breath or breathing problems -signs and symptoms of infection like fever or chills; cough; or sore throat -signs and symptoms of kidney injury like trouble passing urine or change in the amount of urine -stomach or side pain, or pain at the shoulder -sweating -swelling of the legs, ankles, or abdomen -tiredness Side effects that usually do not require medical attention (report to your doctor or health care professional if they continue or are bothersome): -bone pain -headache -muscle pain -vomiting This list may not describe all possible side effects. Call your doctor for medical advice about side effects. You may report side effects to FDA at 1-800-FDA-1088. Where should I keep my medicine? Keep out of the reach of children. Store in a refrigerator between   2 and 8 degrees C (36 and 46 degrees F). Keep in carton to protect from light. Throw away this medicine if it is left out of the refrigerator for more than 5 consecutive days. Throw away any unused medicine after the expiration  date. NOTE: This sheet is a summary. It may not cover all possible information. If you have questions about this medicine, talk to your doctor, pharmacist, or health care provider.  2018 [employer]vier/Gold Standard (2016-01-28 19:07:04)  

## 2018-07-23 ENCOUNTER — Inpatient Hospital Stay: Payer: PPO

## 2018-07-23 VITALS — BP 128/78 | HR 77 | Temp 97.9°F | Resp 20

## 2018-07-23 DIAGNOSIS — Z5111 Encounter for antineoplastic chemotherapy: Secondary | ICD-10-CM | POA: Diagnosis not present

## 2018-07-23 DIAGNOSIS — Z95828 Presence of other vascular implants and grafts: Secondary | ICD-10-CM

## 2018-07-23 MED ORDER — TBO-FILGRASTIM 480 MCG/0.8ML ~~LOC~~ SOSY
480.0000 ug | PREFILLED_SYRINGE | Freq: Once | SUBCUTANEOUS | Status: AC
  Administered 2018-07-23: 480 ug via SUBCUTANEOUS

## 2018-07-23 NOTE — Patient Instructions (Signed)
Tbo-Filgrastim injection What is this medicine? TBO-FILGRASTIM (T B O fil GRA stim) is a granulocyte colony-stimulating factor that stimulates the growth of neutrophils, a type of white blood cell important in the body's fight against infection. It is used to reduce the incidence of fever and infection in patients with certain types of cancer who are receiving chemotherapy that affects the bone marrow. This medicine may be used for other purposes; ask your health care provider or pharmacist if you have questions. COMMON BRAND NAME(S): Granix What should I tell my health care provider before I take this medicine? They need to know if you have any of these conditions: -bone scan or tests planned -kidney disease -sickle cell anemia -an unusual or allergic reaction to tbo-filgrastim, filgrastim, pegfilgrastim, other medicines, foods, dyes, or preservatives -pregnant or trying to get pregnant -breast-feeding How should I use this medicine? This medicine is for injection under the skin. If you get this medicine at home, you will be taught how to prepare and give this medicine. Refer to the Instructions for Use that come with your medication packaging. Use exactly as directed. Take your medicine at regular intervals. Do not take your medicine more often than directed. It is important that you put your used needles and syringes in a special sharps container. Do not put them in a trash can. If you do not have a sharps container, call your pharmacist or healthcare provider to get one. Talk to your pediatrician regarding the use of this medicine in children. Special care may be needed. Overdosage: If you think you have taken too much of this medicine contact a poison control center or emergency room at once. NOTE: This medicine is only for you. Do not share this medicine with others. What if I miss a dose? It is important not to miss your dose. Call your doctor or health care professional if you miss a  dose. What may interact with this medicine? This medicine may interact with the following medications: -medicines that may cause a release of neutrophils, such as lithium This list may not describe all possible interactions. Give your health care provider a list of all the medicines, herbs, non-prescription drugs, or dietary supplements you use. Also tell them if you smoke, drink alcohol, or use illegal drugs. Some items may interact with your medicine. What should I watch for while using this medicine? You may need blood work done while you are taking this medicine. What side effects may I notice from receiving this medicine? Side effects that you should report to your doctor or health care professional as soon as possible: -allergic reactions like skin rash, itching or hives, swelling of the face, lips, or tongue -blood in the urine -dark urine -dizziness -fast heartbeat -feeling faint -shortness of breath or breathing problems -signs and symptoms of infection like fever or chills; cough; or sore throat -signs and symptoms of kidney injury like trouble passing urine or change in the amount of urine -stomach or side pain, or pain at the shoulder -sweating -swelling of the legs, ankles, or abdomen -tiredness Side effects that usually do not require medical attention (report to your doctor or health care professional if they continue or are bothersome): -bone pain -headache -muscle pain -vomiting This list may not describe all possible side effects. Call your doctor for medical advice about side effects. You may report side effects to FDA at 1-800-FDA-1088. Where should I keep my medicine? Keep out of the reach of children. Store in a refrigerator between   2 and 8 degrees C (36 and 46 degrees F). Keep in carton to protect from light. Throw away this medicine if it is left out of the refrigerator for more than 5 consecutive days. Throw away any unused medicine after the expiration  date. NOTE: This sheet is a summary. It may not cover all possible information. If you have questions about this medicine, talk to your doctor, pharmacist, or health care provider.  2018 [employer]vier/Gold Standard (2016-01-28 19:07:04)  

## 2018-07-27 ENCOUNTER — Inpatient Hospital Stay: Payer: PPO

## 2018-07-27 ENCOUNTER — Inpatient Hospital Stay (HOSPITAL_BASED_OUTPATIENT_CLINIC_OR_DEPARTMENT_OTHER): Payer: PPO | Admitting: Adult Health

## 2018-07-27 ENCOUNTER — Other Ambulatory Visit: Payer: Self-pay | Admitting: Oncology

## 2018-07-27 ENCOUNTER — Encounter: Payer: Self-pay | Admitting: Adult Health

## 2018-07-27 ENCOUNTER — Telehealth: Payer: Self-pay | Admitting: Oncology

## 2018-07-27 VITALS — BP 136/78 | HR 60 | Temp 98.0°F | Resp 18 | Ht 65.0 in | Wt 168.3 lb

## 2018-07-27 DIAGNOSIS — C50912 Malignant neoplasm of unspecified site of left female breast: Secondary | ICD-10-CM | POA: Diagnosis not present

## 2018-07-27 DIAGNOSIS — C50411 Malignant neoplasm of upper-outer quadrant of right female breast: Secondary | ICD-10-CM

## 2018-07-27 DIAGNOSIS — Z171 Estrogen receptor negative status [ER-]: Secondary | ICD-10-CM

## 2018-07-27 DIAGNOSIS — C50812 Malignant neoplasm of overlapping sites of left female breast: Secondary | ICD-10-CM

## 2018-07-27 DIAGNOSIS — Z806 Family history of leukemia: Secondary | ICD-10-CM | POA: Diagnosis not present

## 2018-07-27 DIAGNOSIS — Z87891 Personal history of nicotine dependence: Secondary | ICD-10-CM | POA: Diagnosis not present

## 2018-07-27 DIAGNOSIS — T451X5A Adverse effect of antineoplastic and immunosuppressive drugs, initial encounter: Secondary | ICD-10-CM

## 2018-07-27 DIAGNOSIS — Z9013 Acquired absence of bilateral breasts and nipples: Secondary | ICD-10-CM

## 2018-07-27 DIAGNOSIS — Z923 Personal history of irradiation: Secondary | ICD-10-CM | POA: Diagnosis not present

## 2018-07-27 DIAGNOSIS — G620 Drug-induced polyneuropathy: Secondary | ICD-10-CM | POA: Diagnosis not present

## 2018-07-27 DIAGNOSIS — Z170 Estrogen receptor positive status [ER+]: Secondary | ICD-10-CM

## 2018-07-27 DIAGNOSIS — Z5111 Encounter for antineoplastic chemotherapy: Secondary | ICD-10-CM | POA: Diagnosis not present

## 2018-07-27 DIAGNOSIS — Z807 Family history of other malignant neoplasms of lymphoid, hematopoietic and related tissues: Secondary | ICD-10-CM

## 2018-07-27 DIAGNOSIS — Z95828 Presence of other vascular implants and grafts: Secondary | ICD-10-CM

## 2018-07-27 LAB — CBC WITH DIFFERENTIAL (CANCER CENTER ONLY)
BASOS PCT: 0 %
Basophils Absolute: 0 10*3/uL (ref 0.0–0.1)
Eosinophils Absolute: 0 10*3/uL (ref 0.0–0.5)
Eosinophils Relative: 1 %
HEMATOCRIT: 31.7 % — AB (ref 34.8–46.6)
HEMOGLOBIN: 10.6 g/dL — AB (ref 11.6–15.9)
LYMPHS ABS: 1 10*3/uL (ref 0.9–3.3)
Lymphocytes Relative: 39 %
MCH: 33.3 pg (ref 25.1–34.0)
MCHC: 33.4 g/dL (ref 31.5–36.0)
MCV: 99.7 fL (ref 79.5–101.0)
MONO ABS: 0.4 10*3/uL (ref 0.1–0.9)
MONOS PCT: 16 %
NEUTROS ABS: 1.2 10*3/uL — AB (ref 1.5–6.5)
Neutrophils Relative %: 44 %
Platelet Count: 166 10*3/uL (ref 145–400)
RBC: 3.18 MIL/uL — ABNORMAL LOW (ref 3.70–5.45)
RDW: 16.2 % — AB (ref 11.2–14.5)
WBC Count: 2.6 10*3/uL — ABNORMAL LOW (ref 3.9–10.3)

## 2018-07-27 LAB — CMP (CANCER CENTER ONLY)
ALBUMIN: 3.9 g/dL (ref 3.5–5.0)
ALK PHOS: 256 U/L — AB (ref 38–126)
ALT: 27 U/L (ref 0–44)
ANION GAP: 8 (ref 5–15)
AST: 25 U/L (ref 15–41)
BILIRUBIN TOTAL: 0.4 mg/dL (ref 0.3–1.2)
BUN: 13 mg/dL (ref 8–23)
CALCIUM: 9.1 mg/dL (ref 8.9–10.3)
CO2: 25 mmol/L (ref 22–32)
Chloride: 106 mmol/L (ref 98–111)
Creatinine: 0.65 mg/dL (ref 0.44–1.00)
GFR, Est AFR Am: >60 mL/min (ref >60)
GLUCOSE: 96 mg/dL (ref 70–99)
POTASSIUM: 4.1 mmol/L (ref 3.5–5.1)
Sodium: 139 mmol/L (ref 135–145)
TOTAL PROTEIN: 6.3 g/dL — AB (ref 6.5–8.1)

## 2018-07-27 MED ORDER — SODIUM CHLORIDE 0.9 % IV SOLN
Freq: Once | INTRAVENOUS | Status: AC
  Administered 2018-07-27: 13:00:00 via INTRAVENOUS
  Filled 2018-07-27: qty 250

## 2018-07-27 MED ORDER — DIPHENHYDRAMINE HCL 50 MG/ML IJ SOLN
INTRAMUSCULAR | Status: AC
  Filled 2018-07-27: qty 1

## 2018-07-27 MED ORDER — DEXAMETHASONE SODIUM PHOSPHATE 10 MG/ML IJ SOLN
INTRAMUSCULAR | Status: AC
  Filled 2018-07-27: qty 1

## 2018-07-27 MED ORDER — PALONOSETRON HCL INJECTION 0.25 MG/5ML
0.2500 mg | Freq: Once | INTRAVENOUS | Status: AC
  Administered 2018-07-27: 0.25 mg via INTRAVENOUS

## 2018-07-27 MED ORDER — PEGFILGRASTIM 6 MG/0.6ML ~~LOC~~ PSKT
6.0000 mg | PREFILLED_SYRINGE | Freq: Once | SUBCUTANEOUS | Status: AC
  Administered 2018-07-27: 6 mg via SUBCUTANEOUS

## 2018-07-27 MED ORDER — SODIUM CHLORIDE 0.9% FLUSH
10.0000 mL | INTRAVENOUS | Status: DC | PRN
  Administered 2018-07-27: 10 mL
  Filled 2018-07-27: qty 10

## 2018-07-27 MED ORDER — SODIUM CHLORIDE 0.9 % IV SOLN
640.0000 mg/m2 | Freq: Once | INTRAVENOUS | Status: AC
  Administered 2018-07-27: 1254 mg via INTRAVENOUS
  Filled 2018-07-27: qty 32.98

## 2018-07-27 MED ORDER — PEGFILGRASTIM 6 MG/0.6ML ~~LOC~~ PSKT
PREFILLED_SYRINGE | SUBCUTANEOUS | Status: AC
  Filled 2018-07-27: qty 0.6

## 2018-07-27 MED ORDER — HEPARIN SOD (PORK) LOCK FLUSH 100 UNIT/ML IV SOLN
500.0000 [IU] | Freq: Once | INTRAVENOUS | Status: AC | PRN
  Administered 2018-07-27: 500 [IU]
  Filled 2018-07-27: qty 5

## 2018-07-27 MED ORDER — DEXAMETHASONE SODIUM PHOSPHATE 10 MG/ML IJ SOLN
4.0000 mg | Freq: Once | INTRAMUSCULAR | Status: AC
  Administered 2018-07-27: 4 mg via INTRAVENOUS

## 2018-07-27 MED ORDER — CARBOPLATIN CHEMO INJECTION 450 MG/45ML
152.3200 mg | Freq: Once | INTRAVENOUS | Status: AC
  Administered 2018-07-27: 150 mg via INTRAVENOUS
  Filled 2018-07-27: qty 15

## 2018-07-27 MED ORDER — PALONOSETRON HCL INJECTION 0.25 MG/5ML
INTRAVENOUS | Status: AC
  Filled 2018-07-27: qty 5

## 2018-07-27 MED ORDER — FAMOTIDINE IN NACL 20-0.9 MG/50ML-% IV SOLN
20.0000 mg | Freq: Once | INTRAVENOUS | Status: AC
  Administered 2018-07-27: 20 mg via INTRAVENOUS

## 2018-07-27 MED ORDER — FAMOTIDINE IN NACL 20-0.9 MG/50ML-% IV SOLN
INTRAVENOUS | Status: AC
Start: 2018-07-27 — End: ?
  Filled 2018-07-27: qty 50

## 2018-07-27 MED ORDER — DIPHENHYDRAMINE HCL 50 MG/ML IJ SOLN
25.0000 mg | Freq: Once | INTRAMUSCULAR | Status: AC
  Administered 2018-07-27: 25 mg via INTRAVENOUS

## 2018-07-27 NOTE — Progress Notes (Signed)
[place]  [phone]) [phone] [phone]     ID: Samantha Sullivan DOB: [birth year]  MR#: [id number]  FHL#:[id number]  Patient Care Team: Shon Baton, MD as PCP - General (Internal Medicine) Magrinat, Virgie Dad, MD as Consulting Physician (Oncology) Kem Boroughs, [place] as Nurse Practitioner (Family Medicine) Regal, Tamala Fothergill, DPM as Consulting Physician (Podiatry) Excell Seltzer, MD as Consulting Physician (General Surgery) Force, Shari Prows, DO as Referring Physician (Student) PCP: Shon Baton, MD OTHER MD:  CHIEF COMPLAINT: Contralateral breast cancer, triple negative  CURRENT TREATMENT: Adjuvant chemotherapy  INTERVAL HISTORY: Samantha Sullivan "Samantha Sullivan" returns today for follow-up and treatment of her contralateral breast cancer, triple negative.   The patient continues on carboplatin and gemcitabine, which she now receives days 1 and 8 of each 21-day cycle, with Granix on days 2,  3 and 4 and Neulasta/OnPro on day 9.  Today is day 1 of the current cycle-- but a better way to count is that today is the 10th of 12 planned doses of carbo-associated chemo (somewhere we got slightly off count, but this is per our review in the chemo admin record today).   REVIEW OF SYSTEMS: Samantha Sullivan is doing well today. She is mildly fatigued.   She continues to have very mild and minimal peripheral neuropathy.  She is tolerating the treatment well otherwise.  She has not had an appetite change, weight loss, headache, vision change.  She is without fevers, chills, nausea, vomiting, constipation or diarrhea. She is without chest pain, palpitations, shortness of breath or cough.  A detailed ROS was conducted and was otherwise non contributory except what is noted above.   RIGHT BREAST CANCER HISTORY:  From the original intake note:  "[name]"noted a lump in her right breast sometime in late October or early November, but there were "so many things going on in her life" she did not  bring it to medical attention until she saw [name] 12/11/2015. Ms Samantha Sullivan was able to palpate a mass at the 9:30 o'clock position in the right breast, which was mobile and nontender. The left breast is status post prior remote biopsy and there was some scar tissue at the 7:00 position. The patient was set up for bilateral diagnostic Mammography with tomosynthesis at [facility] 12/27/2015. This found the breast density to be category C. There was a new oval mass in the right breast upper outer quadrant. Ultrasound the same day confirmed a 2.2 cm mass with irregular margins in the right breast at the 9:00 position read as highly suggestive of malignancy.  Biopsy of the right breast mass in question 01/02/2016 found ([id number]) an invasive ductal carcinoma, grade 1, estrogen receptor 100% positive, progesterone receptor 90% positive, both with strong staining intensity, with an MIB-1 of 10%, and no HER-2 amplification, the signals ratio being 1.32 and the number per cell 2.05. The patient was informed and instructed to stop hormone replacement therapy (last dose 01/04/2016).  Her subsequent history is as detailed below.    PAST MEDICAL HISTORY: Past Medical History:  Diagnosis Date  . Abnormal CT of the chest 12/06   numeroous lung nodules - most have resolved.  . Arthritis    psoriatic arthritis- toes & feet, but resolved at this point- no need meds at this point    . Cancer [facility])    right breast cancer  . Complication of anesthesia    /w colonoscopy- woke up before procedure was complete  . Family history of adverse reaction to anesthesia  pt's mother reported difficulty being given " enough " medicine to outcome a successful anesth. experience  . Family history of leukemia   . Family history of ovarian cancer   . History of meningioma 2006/03/21   in 03/21/2012 stable on MRI and recheck in 5 years, no surgery necessry   . HSV infection 1970's- 1980's  . Psoriatic arthritis ([place])     PAST SURGICAL  HISTORY: Past Surgical History:  Procedure Laterality Date  . BILATERAL TOTAL MASTECTOMY WITH AXILLARY LYMPH NODE DISSECTION Bilateral 01/04/2018   Procedure: BILATERAL TOTAL MASTECTOMY PROPHYLATIC RIGHT  WITH LEFT SENTINEAL  LYMPH NODE;  Surgeon: Excell Seltzer, MD;  Location: [place];  Service: General;  Laterality: Bilateral;  . BREAST LUMPECTOMY WITH RADIOACTIVE SEED AND SENTINEL LYMPH NODE BIOPSY Right 01/22/2016   Procedure: RIGHT BREAST LUMPECTOMY WITH RADIOACTIVE SEED AND SENTINEL LYMPH NODE BIOPSY;  Surgeon: Excell Seltzer, MD;  Location: [place];  Service: General;  Laterality: Right;  . BREAST SURGERY Left 1971, 03/22/2015   lumpectomy benign, 2015-03-22- [facility]- R lumpectomy & radiation   . COLONOSCOPY  8/06   normal and recheck in 10 years  . HAMMER TOE SURGERY Left 03-22-15  . MASTECTOMY Right 01/04/2018   PROPHYLATIC   . MASTECTOMY COMPLETE / SIMPLE W/ SENTINEL NODE BIOPSY Left 01/04/2018  . PORTACATH PLACEMENT Right 01/04/2018   Procedure: INSERTION PORT-A-CATH;  Surgeon: Excell Seltzer, MD;  Location: [place];  Service: General;  Laterality: Right;  . THYROIDECTOMY, PARTIAL Left 1983    FAMILY HISTORY Family History  Problem Relation Age of Onset  . Osteoporosis Mother   . Osteoporosis Sister   . Lung cancer Maternal Aunt        smoker  . Heart Problems Maternal Grandmother   . COPD Maternal Grandfather   . Neurodegenerative disease Maternal Aunt        corical-ganglion degeneration  . Ovarian cancer Maternal Aunt        dx in her 58s  . Leukemia Cousin        29s-40s; maternal cousin  [name] has essentially no information on her father's side of the family aside from the fact that he died in his 21s from heart disease. He had psoriasis. The patient's mother passed away 2017/03/21 age 57, due to breast cancer. The patient had 2 brothers who died from congenital heart disease shortly after birth. The patient has one sister. The patient's mother had one sister,  the patient's aunt, diagnosed with ovarian cancer in her 52s.  GYNECOLOGIC HISTORY:  Patient's last menstrual period was 03/22/2000 (approximate). Menarche age 75, first live birth age 83. The patient is GX P2. She went through menopause approximately 03-21-1989 but was taking hormone replacement until 01/04/2016. Since coming off replacement she has had insomnia but no hot flashes or vaginal dryness pleural bones so 4. 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 She is divorced and lives alone, with no pets. Her daughter [name] lives in [place]. She is disabled secondary to schizoaffective disorder. Daughter [name] lives in [place] and she is a Estate agent but mostly a homemaker. The patient has 3 grandchildren, the older 90, the other 2 being under 44 years old. The patient attends [employer]. Her mother passed away in 03/21/17 due to breast cancer.    ADVANCED DIRECTIVES: her daughter [name] is  her healthcare power of attorney. She can be reached at [place]: Social History   Tobacco Use  . Smoking status: Former Smoker    Packs/day: 1.00    Years: 12.00    Pack years: 12.00    Types: Cigarettes    Last attempt to quit: 07/23/1981    Years since quitting: 37.0  . Smokeless tobacco: Never Used  Substance Use Topics  . Alcohol use: Yes    Alcohol/week: 0.0 oz    Comment: social occasion only- special   . Drug use: No     Colonoscopy: ; [facility] 2016/[facility]  Bone density:rJanuary 2018/normal   Lipid panel:  Allergies  Allergen Reactions  . Bee Venom   . Ciprofloxacin   . Levaquin [Levofloxacin In D5w]   . Norco [Hydrocodone-Acetaminophen] Other (See Comments)    Pt states Norco made her extremely hyper after her surgery and she was not able to  sleep.    Current Outpatient Medications  Medication Sig Dispense Refill  . folic acid (FOLVITE) 1 MG tablet Take 2 mg by mouth every evening.    . lidocaine-prilocaine (EMLA) cream Apply to affected area once 30 g 3  . Polyethyl Glycol-Propyl Glycol (LUBRICANT EYE DROPS) 0.4-0.3 % SOLN Place 1-2 drops into both eyes 3 (three) times daily as needed (dry eyes/irritated eyes.).    Marland Kitchen prochlorperazine (COMPAZINE) 10 MG tablet Take 1 tablet (10 mg total) by mouth every 6 (six) hours as needed (Nausea or vomiting). 30 tablet 1  . zolpidem (AMBIEN) 5 MG tablet Take 1-2 tablets (5-10 mg total) by mouth at bedtime as needed for sleep. 30 tablet 0   Current Facility-Administered Medications  Medication Dose Route Frequency Provider Last Rate Last Dose  . triamcinolone acetonide (KENALOG) 10 MG/ML injection 10 mg  10 mg Other Once Wallene Huh, DPM        OBJECTIVE:  Vitals:   07/27/18 1113 07/27/18 1123  BP: (!) 156/84 136/78  Sullivan: (!) 57 60  Resp: 18   Temp: 98 F (36.7 C)   SpO2: 100%      Body mass index is 28.01 kg/m.   ECOG FS:1 - Symptomatic but completely ambulatory GENERAL: Patient is a well appearing female in no acute distress HEENT:  Sclerae anicteric.  Oropharynx clear and moist. No ulcerations or evidence of oropharyngeal candidiasis. Neck is supple.  NODES:  No cervical, supraclavicular, or axillary lymphadenopathy palpated.  BREAST EXAM:  Deferred. LUNGS:  Clear to auscultation bilaterally.  No wheezes or rhonchi. HEART:  Regular rate and rhythm. No murmur appreciated. ABDOMEN:  Soft, nontender.  Positive, normoactive bowel sounds. No organomegaly palpated. MSK:  No focal spinal tenderness to palpation. Full range of motion bilaterally in the upper extremities. EXTREMITIES:  No peripheral edema.   SKIN:  Clear with no obvious rashes or skin changes. No nail dyscrasia. NEURO:  Nonfocal. Well oriented.  Appropriate affect.     LAB RESULTS:  CMP     Component  Value Date/Time   NA 139 07/27/2018 0900   NA 141 08/05/2017 1335   K 4.1 07/27/2018 0900   K 4.5 08/05/2017 1335   CL 106 07/27/2018 0900   CO2 25 07/27/2018 0900   CO2 29 08/05/2017 1335   GLUCOSE 96 07/27/2018 0900   GLUCOSE 77 08/05/2017 1335   BUN 13 07/27/2018 0900   BUN 13.4 08/05/2017 1335   CREATININE 0.65 07/27/2018 0900   CREATININE 0.8 08/05/2017 1335   CALCIUM 9.1 07/27/2018  0900   CALCIUM 10.0 08/05/2017 1335   PROT 6.3 (L) 07/27/2018 0900   PROT 6.8 08/05/2017 1335   ALBUMIN 3.9 07/27/2018 0900   ALBUMIN 3.6 08/05/2017 1335   AST 25 07/27/2018 0900   AST 19 08/05/2017 1335   ALT 27 07/27/2018 0900   ALT 12 08/05/2017 1335   ALKPHOS 256 (H) 07/27/2018 0900   ALKPHOS 169 (H) 08/05/2017 1335   BILITOT 0.4 07/27/2018 0900   BILITOT 0.63 08/05/2017 1335   GFRNONAA >60 07/27/2018 0900   GFRAA >60 07/27/2018 0900    INo results found for: SPEP, UPEP  Lab Results  Component Value Date   WBC 2.6 (L) 07/27/2018   NEUTROABS 1.2 (L) 07/27/2018   HGB 10.6 (L) 07/27/2018   HCT 31.7 (L) 07/27/2018   MCV 99.7 07/27/2018   PLT 166 07/27/2018      Chemistry      Component Value Date/Time   NA 139 07/27/2018 0900   NA 141 08/05/2017 1335   K 4.1 07/27/2018 0900   K 4.5 08/05/2017 1335   CL 106 07/27/2018 0900   CO2 25 07/27/2018 0900   CO2 29 08/05/2017 1335   BUN 13 07/27/2018 0900   BUN 13.4 08/05/2017 1335   CREATININE 0.65 07/27/2018 0900   CREATININE 0.8 08/05/2017 1335      Component Value Date/Time   CALCIUM 9.1 07/27/2018 0900   CALCIUM 10.0 08/05/2017 1335   ALKPHOS 256 (H) 07/27/2018 0900   ALKPHOS 169 (H) 08/05/2017 1335   AST 25 07/27/2018 0900   AST 19 08/05/2017 1335   ALT 27 07/27/2018 0900   ALT 12 08/05/2017 1335   BILITOT 0.4 07/27/2018 0900   BILITOT 0.63 08/05/2017 1335       No results found for: LABCA2  No components found for: KXFGH829  No results for input(s): INR in the last 168 hours.  Urinalysis    Component  Value Date/Time   BILIRUBINUR neg 12/11/2015 0925   PROTEINUR neg 12/11/2015 0925   UROBILINOGEN negative 12/11/2015 0925   NITRITE neg 12/11/2015 0925   LEUKOCYTESUR Negative 12/11/2015 0925    STUDIES: No results found.  RESEARCH: enrolled in UPBEAT study; not a candidate for BR 003 because of concurrent cancer  ASSESSMENT: 67 y.o. [place] woman status post right breast upper outer quadrant biopsy 01/02/2016 for a clinical T2 N0, stage IIA invasive ductal carcinoma, grade 1, estrogen and progesterone receptor positive, HER-2 not amplified, with an MIB-1 of 10%.  (1). Right lumpectomy and sentinel lymph node sampling 01/22/2016 showed a pT1c pN0, stage IA invasive ductal carcinoma, grade 1, with close but negative margins. Repeat HER-2 testing was again not amplified   (3) Oncotype score of 18 predicts a risk of recurrence outside the breast within the next 10 years of 11% if the patient's only systemic therapy is tamoxifen for 5 years. It also predicts no significant benefit from chemotherapy.     (4) adjuvant radiation3/14/17-4/11/17 Site/dose:   Right breast - 42.72 gy at 2.67 Gy per fraction x 16 fractions followed by an electron boost to the right breast with 10 Gy at 2 Gy per fraction x 5 fractions.    (5)  Anastrozole started 04/21/2016, discontinued after approximately 1 month with significant side effects  (a)  Bone density at [facility] September 2013 was normal  (b) bone density January 2018 was normal  (c) anastrozole restarted February 2018, held again at the start of chemotherapy February 2019  (6) genetics testing  02/24/2016 through  the Breast/Ovarian cancer gene panel offered by [facility] found no deleterious mutations in ATM, BARD1, BRCA1, BRCA2, BRIP1, CDH1, CHEK2, EPCAM, FANCC, MLH1, MSH2, MSH6, NBN, PALB2, PMS2, PTEN, RAD51C, RAD51D, TP53, and XRCC2.   (7) left breast biopsy 12/17/2017 showed a cT2 cN0, stage IIA- IIB invasive ductal carcinoma, grade 2,  estrogen receptor weakly positive, progesterone receptor negative, with HER-2 equivocal by FISH, negative by immunohistochemistry  (8) status post bilateral mastectomies and left sentinel lymph node sampling 01/04/2018 showing  (a) on the right, no evidence of disease  (b) on the left, a pT2 pN0, stage IIB invasive ductal carcinoma, grade 3, triple negative  (9) adjuvant chemotherapy consisting of cyclophosphamide and doxorubicin in dose dense fashion x4 started 02/16/2018, completed 03/30/2018,  followed by weekly paclitaxel /carboplatin x12 starting 04/13/2018;  (a) Paclitaxel/Carboplatin stopped after 3 cycles due to peripheral neuropathy.  (b)  Gemcitabine Carboplatin started on 5/21, but held x 3 weeks due to prolonged neutropenia  (c) carboplatin and gemcitabine given day 1 and day 8 with OnPro support for the remaining cycles    PLAN:  Samantha Sullivan is doing well today.  I reviewed her labs with her today in detail.  Her ANC is 1.2.  She did receive three days of Granix last week.  She will go ahead and proceed with treatment today with Gemcitabine and Carboplatin.  She will receive Neulasta support today in the form of onpro.  She tolerates this well.    Samantha Sullivan will return in 2 weeks for labs, f/u with Dr. Jana Hakim, and her next chemotherapy.  She is delighted that she only has two treatments left.  She knows to call between now and her next appointment for any questions or concerns.    A total of (20) minutes of face-to-face time was spent with this patient with greater than 50% of that time in counseling and care-coordination.    Wilber Bihari, NP  07/27/18 11:40 AM Medical Oncology and Hematology [facility] [address] [place], [place] [address] Tel. [phone]    Fax. [phone]

## 2018-07-27 NOTE — Patient Instructions (Signed)
[facility] Discharge Instructions for Patients Receiving Chemotherapy  Today you received the following chemotherapy agents Carboplatin and Gemzar  To help prevent nausea and vomiting after your treatment, we encourage you to take your nausea medication as directed.  If you develop nausea and vomiting that is not controlled by your nausea medication, call the clinic.   BELOW ARE SYMPTOMS THAT SHOULD BE REPORTED IMMEDIATELY:  *FEVER GREATER THAN 100.5 F  *CHILLS WITH OR WITHOUT FEVER  NAUSEA AND VOMITING THAT IS NOT CONTROLLED WITH YOUR NAUSEA MEDICATION  *UNUSUAL SHORTNESS OF BREATH  *UNUSUAL BRUISING OR BLEEDING  TENDERNESS IN MOUTH AND THROAT WITH OR WITHOUT PRESENCE OF ULCERS  *URINARY PROBLEMS  *BOWEL PROBLEMS  UNUSUAL RASH Items with * indicate a potential emergency and should be followed up as soon as possible.  Feel free to call the clinic should you have any questions or concerns. The clinic phone number is [phone].  Please show the CHEMO ALERT CARD at check-in to the Emergency Department and triage nurse.   

## 2018-07-27 NOTE — Progress Notes (Signed)
OK to treat with ANC today per NP Ria Comment

## 2018-07-27 NOTE — Telephone Encounter (Signed)
Gave avs and calendar ° °

## 2018-08-09 NOTE — Progress Notes (Signed)
[facility]  [phone]) [phone] [phone]) [phone]     ID: Samantha Sullivan DOB: May 22, 1951  MR#: [id number]  CSN#:[id number]  Patient Care Team: Shon Baton, MD as PCP - General (Internal Medicine) Magrinat, Virgie Dad, MD as Consulting Physician (Oncology) Kem Boroughs, [place] as Nurse Practitioner (Family Medicine) Regal, Tamala Fothergill, DPM as Consulting Physician (Podiatry) Excell Seltzer, MD as Consulting Physician (General Surgery) Force, Shari Prows, DO as Referring Physician (Student) PCP: Shon Baton, MD OTHER MD:  CHIEF COMPLAINT: Contralateral breast cancer, triple negative  CURRENT TREATMENT: Completing adjuvant chemotherapy  INTERVAL HISTORY: Samantha Sullivan "Samantha Sullivan" returns today for follow-up and treatment of her contralateral breast cancer, triple negative.  The patient continues on carboplatin and gemcitabine, which she now receives days 1 and 8 of each 21-day cycle, and Neulasta/OnPro on day 9. Today is day 1 cycle 11 of 12 planned cycles. She tolerates this well.   She has some slight numbness in her finger tips. She notes that this doesn't interfere with her daily activities. She denies numbness in her toes.   Previously, she was on anastrozole. She tolerated this well. She did not have hot flashes or issues with vaginal dryness. Her most recent bone density was in 2013.    REVIEW OF SYSTEMS: Samantha Sullivan reports that she is bruising more easily. She has also been sensitive to bug bites.  She plans on going on a family vacation in September. She denies unusual headaches, visual changes, nausea, vomiting, or dizziness. There has been no unusual cough, phlegm production, or pleurisy. There has been no change in bowel or bladder habits. She denies unexplained fatigue or unexplained weight loss, bleeding, rash, or fever. A detailed review of systems was otherwise stable.    RIGHT BREAST CANCER HISTORY:  From the original intake note:  "Samantha Sullivan"noted a lump  in her right breast sometime in late October or early November, but there were "so many things going on in her life" she did not bring it to medical attention until she saw [name] 12/11/2015. Samantha Sullivan was able to palpate a mass at the 9:30 o'clock position in the right breast, which was mobile and nontender. The left breast is status post prior remote biopsy and there was some scar tissue at the 7:00 position. The patient was set up for bilateral diagnostic Mammography with tomosynthesis at [facility] 12/27/2015. This found the breast density to be category C. There was a new oval mass in the right breast upper outer quadrant. Ultrasound the same day confirmed a 2.2 cm mass with irregular margins in the right breast at the 9:00 position read as highly suggestive of malignancy.  Biopsy of the right breast mass in question 01/02/2016 found ([id number]) an invasive ductal carcinoma, grade 1, estrogen receptor 100% positive, progesterone receptor 90% positive, both with strong staining intensity, with an MIB-1 of 10%, and no HER-2 amplification, the signals ratio being 1.32 and the number per cell 2.05. The patient was informed and instructed to stop hormone replacement therapy (last dose 01/04/2016).  Her subsequent history is as detailed below.    PAST MEDICAL HISTORY: Past Medical History:  Diagnosis Date  . Abnormal CT of the chest 12/06   numeroous lung nodules - most have resolved.  . Arthritis    psoriatic arthritis- toes & feet, but resolved at this point- no need meds at this point    . Cancer [facility])    right breast cancer  . Complication of anesthesia    /w colonoscopy- woke up  before procedure was complete  . Family history of adverse reaction to anesthesia    pt's mother reported difficulty being given " enough " medicine to outcome a successful anesth. experience  . Family history of leukemia   . Family history of ovarian cancer   . History of meningioma 2007   in 2013 stable on MRI  and recheck in 5 years, no surgery necessry   . HSV infection 1970's- 1980's  . Psoriatic arthritis ([place])     PAST SURGICAL HISTORY: Past Surgical History:  Procedure Laterality Date  . BILATERAL TOTAL MASTECTOMY WITH AXILLARY LYMPH NODE DISSECTION Bilateral 01/04/2018   Procedure: BILATERAL TOTAL MASTECTOMY PROPHYLATIC RIGHT  WITH LEFT SENTINEAL  LYMPH NODE;  Surgeon: Excell Seltzer, MD;  Location: [facility];  Service: General;  Laterality: Bilateral;  . BREAST LUMPECTOMY WITH RADIOACTIVE SEED AND SENTINEL LYMPH NODE BIOPSY Right 01/22/2016   Procedure: RIGHT BREAST LUMPECTOMY WITH RADIOACTIVE SEED AND SENTINEL LYMPH NODE BIOPSY;  Surgeon: Excell Seltzer, MD;  Location: [facility];  Service: General;  Laterality: Right;  . BREAST SURGERY Left 1971, 2016   lumpectomy benign, 2016- [facility]- R lumpectomy & radiation   . COLONOSCOPY  8/06   normal and recheck in 10 years  . HAMMER TOE SURGERY Left 2016  . MASTECTOMY Right 01/04/2018   PROPHYLATIC   . MASTECTOMY COMPLETE / SIMPLE W/ SENTINEL NODE BIOPSY Left 01/04/2018  . PORTACATH PLACEMENT Right 01/04/2018   Procedure: INSERTION PORT-A-CATH;  Surgeon: Excell Seltzer, MD;  Location: [facility];  Service: General;  Laterality: Right;  . THYROIDECTOMY, PARTIAL Left 1983    FAMILY HISTORY Family History  Problem Relation Age of Onset  . Osteoporosis Mother   . Osteoporosis Sister   . Lung cancer Maternal Aunt        smoker  . Heart Problems Maternal Grandmother   . COPD Maternal Grandfather   . Neurodegenerative disease Maternal Aunt        corical-ganglion degeneration  . Ovarian cancer Maternal Aunt        dx in her 66s  . Leukemia Cousin        66s-40s; maternal cousin  [name] has essentially no information on her father's side of the family aside from the fact that he died in his 63s from heart disease. He had psoriasis. The patient's mother passed away 03/19/2017 age 42, due to breast cancer. The patient had 2  brothers who died from congenital heart disease shortly after birth. The patient has one sister. The patient's mother had one sister, the patient's aunt, diagnosed with ovarian cancer in her 42s.  GYNECOLOGIC HISTORY:  Patient's last menstrual period was 03/22/2000 (approximate). Menarche age 20, first live birth age 9. The patient is GX P2. She went through menopause approximately 1990 but was taking hormone replacement until 01/04/2016. Since coming off replacement she has had insomnia but no hot flashes or vaginal dryness pleural bones so 4. 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 She is divorced and lives alone, with no pets. Her daughter [name] lives in [place]. She is disabled secondary to schizoaffective disorder. Daughter [name] lives in [place] and she is a Estate agent but mostly a homemaker. The patient has 3 grandchildren, the older 72, the other 2 being under 36 years old. The patient attends [employer]. Her mother passed away  in March 2018 due to breast cancer.    ADVANCED DIRECTIVES: her daughter [name] is her healthcare power of attorney. She can be reached at [place]: Social History   Tobacco Use  . Smoking status: Former Smoker    Packs/day: 1.00    Years: 12.00    Pack years: 12.00    Types: Cigarettes    Last attempt to quit: 07/23/1981    Years since quitting: 37.0  . Smokeless tobacco: Never Used  Substance Use Topics  . Alcohol use: Yes    Alcohol/week: 0.0 standard drinks    Comment: social occasion only- special   . Drug use: No     Colonoscopy: ; [facility]  JSE:GBTDVVO 2016/[facility]  Bone density:rJanuary 2018/normal   Lipid panel:  Allergies  Allergen Reactions  . Bee Venom   . Ciprofloxacin   . Levaquin [Levofloxacin In D5w]   .  Norco [Hydrocodone-Acetaminophen] Other (See Comments)    Pt states Norco made her extremely hyper after her surgery and she was not able to sleep.    Current Outpatient Medications  Medication Sig Dispense Refill  . anastrozole (ARIMIDEX) 1 MG tablet Take 1 tablet (1 mg total) by mouth daily. 90 tablet 4  . folic acid (FOLVITE) 1 MG tablet Take 2 mg by mouth every evening.    . lidocaine-prilocaine (EMLA) cream Apply to affected area once 30 g 3  . Polyethyl Glycol-Propyl Glycol (LUBRICANT EYE DROPS) 0.4-0.3 % SOLN Place 1-2 drops into both eyes 3 (three) times daily as needed (dry eyes/irritated eyes.).    Marland Kitchen prochlorperazine (COMPAZINE) 10 MG tablet Take 1 tablet (10 mg total) by mouth every 6 (six) hours as needed (Nausea or vomiting). 30 tablet 1  . zolpidem (AMBIEN) 5 MG tablet Take 1-2 tablets (5-10 mg total) by mouth at bedtime as needed for sleep. 30 tablet 0   Current Facility-Administered Medications  Medication Dose Route Frequency Provider Last Rate Last Dose  . triamcinolone acetonide (KENALOG) 10 MG/ML injection 10 mg  10 mg Other Once Wallene Huh, DPM        OBJECTIVE: Middle-aged white woman who appears well  Vitals:   08/10/18 1235  BP: (!) 143/84  Sullivan: 60  Resp: 18  Temp: 98.3 F (36.8 C)  SpO2: 100%     Body mass index is 28.31 kg/m.   ECOG FS:1 - Symptomatic but completely ambulatory  Sclerae unicteric, EOMs intact Oropharynx clear and moist No cervical or supraclavicular adenopathy Lungs no rales or rhonchi Heart regular rate and rhythm Abd soft, nontender, positive bowel sounds MSK no focal spinal tenderness, no upper extremity lymphedema Neuro: nonfocal, well oriented, appropriate affect Breasts: Deferred     LAB RESULTS:  CMP     Component Value Date/Time   NA 140 08/10/2018 1136   NA 141 08/05/2017 1335   K 4.1 08/10/2018 1136   K 4.5 08/05/2017 1335   CL 107 08/10/2018 1136   CO2 27 08/10/2018 1136   CO2 29 08/05/2017 1335    GLUCOSE 86 08/10/2018 1136   GLUCOSE 77 08/05/2017 1335   BUN 10 08/10/2018 1136   BUN 13.4 08/05/2017 1335   CREATININE 0.62 08/10/2018 1136   CREATININE 0.8 08/05/2017 1335   CALCIUM 8.8 (L) 08/10/2018 1136   CALCIUM 10.0 08/05/2017 1335   PROT 6.2 (L) 08/10/2018 1136   PROT 6.8 08/05/2017 1335   ALBUMIN 3.9 08/10/2018 1136   ALBUMIN 3.6 08/05/2017 1335   AST 16 08/10/2018 1136  AST 19 08/05/2017 1335   ALT 11 08/10/2018 1136   ALT 12 08/05/2017 1335   ALKPHOS 237 (H) 08/10/2018 1136   ALKPHOS 169 (H) 08/05/2017 1335   BILITOT 0.4 08/10/2018 1136   BILITOT 0.63 08/05/2017 1335   GFRNONAA >60 08/10/2018 1136   GFRAA >60 08/10/2018 1136    INo results found for: SPEP, UPEP  Lab Results  Component Value Date   WBC 3.1 (L) 08/10/2018   NEUTROABS 1.8 08/10/2018   HGB 10.2 (L) 08/10/2018   HCT 30.6 (L) 08/10/2018   MCV 101.4 (H) 08/10/2018   PLT 165 08/10/2018      Chemistry      Component Value Date/Time   NA 140 08/10/2018 1136   NA 141 08/05/2017 1335   K 4.1 08/10/2018 1136   K 4.5 08/05/2017 1335   CL 107 08/10/2018 1136   CO2 27 08/10/2018 1136   CO2 29 08/05/2017 1335   BUN 10 08/10/2018 1136   BUN 13.4 08/05/2017 1335   CREATININE 0.62 08/10/2018 1136   CREATININE 0.8 08/05/2017 1335      Component Value Date/Time   CALCIUM 8.8 (L) 08/10/2018 1136   CALCIUM 10.0 08/05/2017 1335   ALKPHOS 237 (H) 08/10/2018 1136   ALKPHOS 169 (H) 08/05/2017 1335   AST 16 08/10/2018 1136   AST 19 08/05/2017 1335   ALT 11 08/10/2018 1136   ALT 12 08/05/2017 1335   BILITOT 0.4 08/10/2018 1136   BILITOT 0.63 08/05/2017 1335       No results found for: LABCA2  No components found for: LABCA125  No results for input(s): INR in the last 168 hours.  Urinalysis    Component Value Date/Time   BILIRUBINUR neg 12/11/2015 0925   PROTEINUR neg 12/11/2015 0925   UROBILINOGEN negative 12/11/2015 0925   NITRITE neg 12/11/2015 0925   LEUKOCYTESUR Negative 12/11/2015  0925    STUDIES: No results found.  RESEARCH: enrolled in UPBEAT study; not a candidate for BR 003 because of concurrent cancer  ASSESSMENT: 67 y.o. [place] woman status post right breast upper outer quadrant biopsy 01/02/2016 for a clinical T2 N0, stage IIA invasive ductal carcinoma, grade 1, estrogen and progesterone receptor positive, HER-2 not amplified, with an MIB-1 of 10%.  (1). Right lumpectomy and sentinel lymph node sampling 01/22/2016 showed a pT1c pN0, stage IA invasive ductal carcinoma, grade 1, with close but negative margins. Repeat HER-2 testing was again not amplified   (3) Oncotype score of 18 predicts a risk of recurrence outside the breast within the next 10 years of 11% if the patient's only systemic therapy is tamoxifen for 5 years. It also predicts no significant benefit from chemotherapy.     (4) adjuvant radiation3/14/17-4/11/17 Site/dose:   Right breast - 42.72 gy at 2.67 Gy per fraction x 16 fractions followed by an electron boost to the right breast with 10 Gy at 2 Gy per fraction x 5 fractions.    (5)  Anastrozole started 04/21/2016, discontinued after approximately 1 month with significant side effects  (a)  [facility] 2013 was normal  (b) bone density January 2018 was normal  (c) anastrozole restarted February 2018, held again at the start of chemotherapy February 2019  (6) genetics testing  02/24/2016 through the Breast/Ovarian cancer gene panel offered by [facility] found no deleterious mutations in ATM, BARD1, BRCA1, BRCA2, BRIP1, CDH1, CHEK2, EPCAM, FANCC, MLH1, MSH2, MSH6, NBN, PALB2, PMS2, PTEN, RAD51C, RAD51D, TP53, and XRCC2.   (7) left breast  biopsy 12/17/2017 showed a cT2 cN0, stage IIA- IIB invasive ductal carcinoma, grade 2, estrogen receptor weakly positive, progesterone receptor negative, with HER-2 equivocal by FISH, negative by immunohistochemistry  (8) status post bilateral mastectomies and left sentinel lymph  node sampling 01/04/2018 showing  (a) on the right, no evidence of disease  (b) on the left, a pT2 pN0, stage IIB invasive ductal carcinoma, grade 3, triple negative  (9) adjuvant chemotherapy consisting of cyclophosphamide and doxorubicin in dose dense fashion x4 started 02/16/2018, completed 03/30/2018,  followed by weekly paclitaxel /carboplatin x12 starting 04/13/2018;  (a) Paclitaxel/Carboplatin stopped after 3 cycles due to peripheral neuropathy.  (b)  Gemcitabine Carboplatin started on 5/21, but held x 3 weeks due to prolonged neutropenia  (c) carboplatin and gemcitabine given day 1 and day 8 with OnPro support for the remaining cycles   (10) to resume anastrozole 09/21/2018   PLAN:  Samantha Sullivan will complete her chemotherapy next week.  She has done remarkably well.  It is wonderful that her hair is growing thickly and curly and dark.  She is already planning a great week with her family at the beach in mid to late September.  When she initially tried anastrozole she had great difficulties with it but when she restarted it later she tolerated it much better so she is very willing to go back to that drug and I think the time to do that would be September 21, 2018, so she can had a little vacation off treatment.  She is concerned regarding recurrence of course.  We discussed scans and she understands that they place her at risk of false positives and expose her to unneeded radiation.  At this point the plan is for no scans except to evaluate specific symptoms and that of course is the current standard.  I encouraged her to participate in the finding your new normal group.  She will see me again in November.  She knows to call for any problems that may develop before that visit.    Magrinat, Virgie Dad, MD  08/10/18 1:04 PM Medical Oncology and Hematology [facility] [address] [place], [place] [address] Tel. [phone]    Fax. [phone]  Alice Rieger, am  acting as scribe for Chauncey Cruel MD.  I, Lurline Del MD, have reviewed the above documentation for accuracy and completeness, and I agree with the above.

## 2018-08-10 ENCOUNTER — Inpatient Hospital Stay (HOSPITAL_BASED_OUTPATIENT_CLINIC_OR_DEPARTMENT_OTHER): Payer: PPO | Admitting: Oncology

## 2018-08-10 ENCOUNTER — Inpatient Hospital Stay: Payer: PPO

## 2018-08-10 ENCOUNTER — Telehealth: Payer: Self-pay | Admitting: Oncology

## 2018-08-10 ENCOUNTER — Inpatient Hospital Stay (HOSPITAL_BASED_OUTPATIENT_CLINIC_OR_DEPARTMENT_OTHER): Payer: PPO | Admitting: Nurse Practitioner

## 2018-08-10 VITALS — BP 143/84 | HR 60 | Temp 98.3°F | Resp 18 | Ht 65.0 in | Wt 170.1 lb

## 2018-08-10 DIAGNOSIS — Z9221 Personal history of antineoplastic chemotherapy: Secondary | ICD-10-CM

## 2018-08-10 DIAGNOSIS — C50411 Malignant neoplasm of upper-outer quadrant of right female breast: Secondary | ICD-10-CM

## 2018-08-10 DIAGNOSIS — Z9013 Acquired absence of bilateral breasts and nipples: Secondary | ICD-10-CM

## 2018-08-10 DIAGNOSIS — C50912 Malignant neoplasm of unspecified site of left female breast: Secondary | ICD-10-CM | POA: Diagnosis not present

## 2018-08-10 DIAGNOSIS — Z923 Personal history of irradiation: Secondary | ICD-10-CM

## 2018-08-10 DIAGNOSIS — Z5111 Encounter for antineoplastic chemotherapy: Secondary | ICD-10-CM | POA: Diagnosis not present

## 2018-08-10 DIAGNOSIS — C50812 Malignant neoplasm of overlapping sites of left female breast: Secondary | ICD-10-CM

## 2018-08-10 DIAGNOSIS — Z170 Estrogen receptor positive status [ER+]: Secondary | ICD-10-CM

## 2018-08-10 LAB — CMP (CANCER CENTER ONLY)
ALK PHOS: 237 U/L — AB (ref 38–126)
ALT: 11 U/L (ref 0–44)
AST: 16 U/L (ref 15–41)
Albumin: 3.9 g/dL (ref 3.5–5.0)
Anion gap: 6 (ref 5–15)
BILIRUBIN TOTAL: 0.4 mg/dL (ref 0.3–1.2)
BUN: 10 mg/dL (ref 8–23)
CALCIUM: 8.8 mg/dL — AB (ref 8.9–10.3)
CHLORIDE: 107 mmol/L (ref 98–111)
CO2: 27 mmol/L (ref 22–32)
CREATININE: 0.62 mg/dL (ref 0.44–1.00)
Glucose, Bld: 86 mg/dL (ref 70–99)
Potassium: 4.1 mmol/L (ref 3.5–5.1)
Sodium: 140 mmol/L (ref 135–145)
TOTAL PROTEIN: 6.2 g/dL — AB (ref 6.5–8.1)

## 2018-08-10 LAB — CBC WITH DIFFERENTIAL (CANCER CENTER ONLY)
Basophils Absolute: 0 10*3/uL (ref 0.0–0.1)
Basophils Relative: 0 %
EOS PCT: 1 %
Eosinophils Absolute: 0 10*3/uL (ref 0.0–0.5)
HEMATOCRIT: 30.6 % — AB (ref 34.8–46.6)
Hemoglobin: 10.2 g/dL — ABNORMAL LOW (ref 11.6–15.9)
LYMPHS ABS: 0.7 10*3/uL — AB (ref 0.9–3.3)
LYMPHS PCT: 22 %
MCH: 33.8 pg (ref 25.1–34.0)
MCHC: 33.4 g/dL (ref 31.5–36.0)
MCV: 101.4 fL — AB (ref 79.5–101.0)
Monocytes Absolute: 0.6 10*3/uL (ref 0.1–0.9)
Monocytes Relative: 20 %
Neutro Abs: 1.8 10*3/uL (ref 1.5–6.5)
Neutrophils Relative %: 57 %
PLATELETS: 165 10*3/uL (ref 145–400)
RBC: 3.02 MIL/uL — ABNORMAL LOW (ref 3.70–5.45)
RDW: 18.6 % — ABNORMAL HIGH (ref 11.2–14.5)
WBC: 3.1 10*3/uL — AB (ref 3.9–10.3)

## 2018-08-10 MED ORDER — DIPHENHYDRAMINE HCL 50 MG/ML IJ SOLN
25.0000 mg | Freq: Once | INTRAMUSCULAR | Status: AC
  Administered 2018-08-10: 25 mg via INTRAVENOUS

## 2018-08-10 MED ORDER — DIPHENHYDRAMINE HCL 50 MG/ML IJ SOLN
INTRAMUSCULAR | Status: AC
  Filled 2018-08-10: qty 1

## 2018-08-10 MED ORDER — SODIUM CHLORIDE 0.9 % IV SOLN
152.3200 mg | Freq: Once | INTRAVENOUS | Status: AC
  Administered 2018-08-10: 150 mg via INTRAVENOUS
  Filled 2018-08-10: qty 15

## 2018-08-10 MED ORDER — PALONOSETRON HCL INJECTION 0.25 MG/5ML
INTRAVENOUS | Status: AC
  Filled 2018-08-10: qty 5

## 2018-08-10 MED ORDER — HEPARIN SOD (PORK) LOCK FLUSH 100 UNIT/ML IV SOLN
500.0000 [IU] | Freq: Once | INTRAVENOUS | Status: AC | PRN
  Administered 2018-08-10: 500 [IU]
  Filled 2018-08-10: qty 5

## 2018-08-10 MED ORDER — SODIUM CHLORIDE 0.9% FLUSH
10.0000 mL | INTRAVENOUS | Status: DC | PRN
  Administered 2018-08-10: 10 mL
  Filled 2018-08-10: qty 10

## 2018-08-10 MED ORDER — FAMOTIDINE IN NACL 20-0.9 MG/50ML-% IV SOLN
20.0000 mg | Freq: Once | INTRAVENOUS | Status: AC
  Administered 2018-08-10: 20 mg via INTRAVENOUS

## 2018-08-10 MED ORDER — PALONOSETRON HCL INJECTION 0.25 MG/5ML
0.2500 mg | Freq: Once | INTRAVENOUS | Status: AC
  Administered 2018-08-10: 0.25 mg via INTRAVENOUS

## 2018-08-10 MED ORDER — DEXAMETHASONE SODIUM PHOSPHATE 10 MG/ML IJ SOLN
INTRAMUSCULAR | Status: AC
  Filled 2018-08-10: qty 1

## 2018-08-10 MED ORDER — DEXAMETHASONE SODIUM PHOSPHATE 10 MG/ML IJ SOLN
4.0000 mg | Freq: Once | INTRAMUSCULAR | Status: AC
  Administered 2018-08-10: 4 mg via INTRAVENOUS

## 2018-08-10 MED ORDER — SODIUM CHLORIDE 0.9 % IV SOLN
Freq: Once | INTRAVENOUS | Status: AC
  Administered 2018-08-10: 13:00:00 via INTRAVENOUS
  Filled 2018-08-10: qty 250

## 2018-08-10 MED ORDER — FAMOTIDINE IN NACL 20-0.9 MG/50ML-% IV SOLN
INTRAVENOUS | Status: AC
  Filled 2018-08-10: qty 50

## 2018-08-10 MED ORDER — ANASTROZOLE 1 MG PO TABS: 1.0000 mg | ORAL_TABLET | Freq: Every day | ORAL | 4 refills | Status: DC

## 2018-08-10 MED ORDER — SODIUM CHLORIDE 0.9 % IV SOLN
640.0000 mg/m2 | Freq: Once | INTRAVENOUS | Status: AC
  Administered 2018-08-10: 1254 mg via INTRAVENOUS
  Filled 2018-08-10: qty 32.98

## 2018-08-10 NOTE — Telephone Encounter (Signed)
Gave patient avs report and appointments for August thru November.

## 2018-08-10 NOTE — Patient Instructions (Signed)
[facility] Discharge Instructions for Patients Receiving Chemotherapy  Today you received the following chemotherapy agents Carboplatin and Gemzar  To help prevent nausea and vomiting after your treatment, we encourage you to take your nausea medication as directed.  If you develop nausea and vomiting that is not controlled by your nausea medication, call the clinic.   BELOW ARE SYMPTOMS THAT SHOULD BE REPORTED IMMEDIATELY:  *FEVER GREATER THAN 100.5 F  *CHILLS WITH OR WITHOUT FEVER  NAUSEA AND VOMITING THAT IS NOT CONTROLLED WITH YOUR NAUSEA MEDICATION  *UNUSUAL SHORTNESS OF BREATH  *UNUSUAL BRUISING OR BLEEDING  TENDERNESS IN MOUTH AND THROAT WITH OR WITHOUT PRESENCE OF ULCERS  *URINARY PROBLEMS  *BOWEL PROBLEMS  UNUSUAL RASH Items with * indicate a potential emergency and should be followed up as soon as possible.  Feel free to call the clinic should you have any questions or concerns. The clinic phone number is [phone].  Please show the CHEMO ALERT CARD at check-in to the Emergency Department and triage nurse.   

## 2018-08-11 ENCOUNTER — Inpatient Hospital Stay: Payer: PPO

## 2018-08-11 DIAGNOSIS — Z95828 Presence of other vascular implants and grafts: Secondary | ICD-10-CM

## 2018-08-11 DIAGNOSIS — Z5111 Encounter for antineoplastic chemotherapy: Secondary | ICD-10-CM | POA: Diagnosis not present

## 2018-08-11 MED ORDER — TBO-FILGRASTIM 300 MCG/0.5ML ~~LOC~~ SOSY
300.0000 ug | PREFILLED_SYRINGE | Freq: Once | SUBCUTANEOUS | Status: AC
  Administered 2018-08-11: 300 ug via SUBCUTANEOUS

## 2018-08-11 MED ORDER — TBO-FILGRASTIM 300 MCG/0.5ML ~~LOC~~ SOSY
PREFILLED_SYRINGE | SUBCUTANEOUS | Status: AC
  Filled 2018-08-11: qty 0.5

## 2018-08-11 NOTE — Addendum Note (Signed)
Addended by: Chauncey Cruel on: 08/11/2018 08:18 AM   Modules accepted: Orders

## 2018-08-12 ENCOUNTER — Inpatient Hospital Stay: Payer: PPO

## 2018-08-12 DIAGNOSIS — Z5111 Encounter for antineoplastic chemotherapy: Secondary | ICD-10-CM | POA: Diagnosis not present

## 2018-08-12 DIAGNOSIS — Z95828 Presence of other vascular implants and grafts: Secondary | ICD-10-CM

## 2018-08-12 MED ORDER — TBO-FILGRASTIM 300 MCG/0.5ML ~~LOC~~ SOSY
PREFILLED_SYRINGE | SUBCUTANEOUS | Status: AC
  Filled 2018-08-12: qty 0.5

## 2018-08-12 MED ORDER — TBO-FILGRASTIM 300 MCG/0.5ML ~~LOC~~ SOSY
300.0000 ug | PREFILLED_SYRINGE | Freq: Once | SUBCUTANEOUS | Status: AC
  Administered 2018-08-12: 300 ug via SUBCUTANEOUS

## 2018-08-13 ENCOUNTER — Inpatient Hospital Stay: Payer: PPO

## 2018-08-13 DIAGNOSIS — Z95828 Presence of other vascular implants and grafts: Secondary | ICD-10-CM

## 2018-08-13 DIAGNOSIS — Z5111 Encounter for antineoplastic chemotherapy: Secondary | ICD-10-CM | POA: Diagnosis not present

## 2018-08-13 MED ORDER — TBO-FILGRASTIM 300 MCG/0.5ML ~~LOC~~ SOSY
PREFILLED_SYRINGE | SUBCUTANEOUS | Status: AC
  Filled 2018-08-13: qty 0.5

## 2018-08-13 MED ORDER — TBO-FILGRASTIM 300 MCG/0.5ML ~~LOC~~ SOSY
300.0000 ug | PREFILLED_SYRINGE | Freq: Once | SUBCUTANEOUS | Status: AC
  Administered 2018-08-13: 300 ug via SUBCUTANEOUS

## 2018-08-17 ENCOUNTER — Inpatient Hospital Stay: Payer: PPO

## 2018-08-17 ENCOUNTER — Encounter: Payer: Self-pay | Admitting: Adult Health

## 2018-08-17 ENCOUNTER — Telehealth: Payer: Self-pay | Admitting: *Deleted

## 2018-08-17 ENCOUNTER — Inpatient Hospital Stay (HOSPITAL_BASED_OUTPATIENT_CLINIC_OR_DEPARTMENT_OTHER): Payer: PPO | Admitting: Adult Health

## 2018-08-17 ENCOUNTER — Other Ambulatory Visit: Payer: Self-pay | Admitting: *Deleted

## 2018-08-17 VITALS — BP 149/83 | HR 66 | Temp 97.5°F | Resp 16 | Ht 65.0 in | Wt 168.3 lb

## 2018-08-17 DIAGNOSIS — Z170 Estrogen receptor positive status [ER+]: Secondary | ICD-10-CM

## 2018-08-17 DIAGNOSIS — C50912 Malignant neoplasm of unspecified site of left female breast: Secondary | ICD-10-CM | POA: Diagnosis not present

## 2018-08-17 DIAGNOSIS — C50812 Malignant neoplasm of overlapping sites of left female breast: Secondary | ICD-10-CM

## 2018-08-17 DIAGNOSIS — C50411 Malignant neoplasm of upper-outer quadrant of right female breast: Secondary | ICD-10-CM | POA: Diagnosis not present

## 2018-08-17 DIAGNOSIS — Z9221 Personal history of antineoplastic chemotherapy: Secondary | ICD-10-CM

## 2018-08-17 DIAGNOSIS — Z95828 Presence of other vascular implants and grafts: Secondary | ICD-10-CM

## 2018-08-17 DIAGNOSIS — Z5111 Encounter for antineoplastic chemotherapy: Secondary | ICD-10-CM | POA: Diagnosis not present

## 2018-08-17 DIAGNOSIS — Z923 Personal history of irradiation: Secondary | ICD-10-CM | POA: Diagnosis not present

## 2018-08-17 DIAGNOSIS — E2839 Other primary ovarian failure: Secondary | ICD-10-CM

## 2018-08-17 DIAGNOSIS — Z9013 Acquired absence of bilateral breasts and nipples: Secondary | ICD-10-CM | POA: Diagnosis not present

## 2018-08-17 DIAGNOSIS — D701 Agranulocytosis secondary to cancer chemotherapy: Secondary | ICD-10-CM | POA: Diagnosis not present

## 2018-08-17 LAB — CBC WITH DIFFERENTIAL (CANCER CENTER ONLY)
Basophils Absolute: 0 10*3/uL (ref 0.0–0.1)
Basophils Relative: 0 %
EOS PCT: 1 %
Eosinophils Absolute: 0 10*3/uL (ref 0.0–0.5)
HEMATOCRIT: 30.1 % — AB (ref 34.8–46.6)
Hemoglobin: 10.2 g/dL — ABNORMAL LOW (ref 11.6–15.9)
LYMPHS ABS: 0.7 10*3/uL — AB (ref 0.9–3.3)
LYMPHS PCT: 31 %
MCH: 34 pg (ref 25.1–34.0)
MCHC: 33.9 g/dL (ref 31.5–36.0)
MCV: 100.3 fL (ref 79.5–101.0)
Monocytes Absolute: 0.5 10*3/uL (ref 0.1–0.9)
Monocytes Relative: 23 %
NEUTROS ABS: 1.1 10*3/uL — AB (ref 1.5–6.5)
Neutrophils Relative %: 45 %
PLATELETS: 186 10*3/uL (ref 145–400)
RBC: 3 MIL/uL — AB (ref 3.70–5.45)
RDW: 16 % — ABNORMAL HIGH (ref 11.2–14.5)
WBC: 2.3 10*3/uL — AB (ref 3.9–10.3)

## 2018-08-17 LAB — CMP (CANCER CENTER ONLY)
ALBUMIN: 4.1 g/dL (ref 3.5–5.0)
ALT: 26 U/L (ref 0–44)
AST: 27 U/L (ref 15–41)
Alkaline Phosphatase: 235 U/L — ABNORMAL HIGH (ref 38–126)
Anion gap: 8 (ref 5–15)
BUN: 12 mg/dL (ref 8–23)
CHLORIDE: 107 mmol/L (ref 98–111)
CO2: 27 mmol/L (ref 22–32)
Calcium: 9.4 mg/dL (ref 8.9–10.3)
Creatinine: 0.69 mg/dL (ref 0.44–1.00)
GFR, Est AFR Am: >60 mL/min (ref >60)
GFR, Estimated: >60 mL/min (ref >60)
Glucose, Bld: 100 mg/dL — ABNORMAL HIGH (ref 70–99)
POTASSIUM: 3.9 mmol/L (ref 3.5–5.1)
Sodium: 142 mmol/L (ref 135–145)
Total Bilirubin: 0.5 mg/dL (ref 0.3–1.2)
Total Protein: 6.4 g/dL — ABNORMAL LOW (ref 6.5–8.1)

## 2018-08-17 MED ORDER — PEGFILGRASTIM 6 MG/0.6ML ~~LOC~~ PSKT
6.0000 mg | PREFILLED_SYRINGE | Freq: Once | SUBCUTANEOUS | Status: AC
  Administered 2018-08-17: 6 mg via SUBCUTANEOUS

## 2018-08-17 MED ORDER — DEXAMETHASONE SODIUM PHOSPHATE 10 MG/ML IJ SOLN
INTRAMUSCULAR | Status: AC
  Filled 2018-08-17: qty 1

## 2018-08-17 MED ORDER — PALONOSETRON HCL INJECTION 0.25 MG/5ML
0.2500 mg | Freq: Once | INTRAVENOUS | Status: AC
  Administered 2018-08-17: 0.25 mg via INTRAVENOUS

## 2018-08-17 MED ORDER — PALONOSETRON HCL INJECTION 0.25 MG/5ML
INTRAVENOUS | Status: AC
  Filled 2018-08-17: qty 5

## 2018-08-17 MED ORDER — FAMOTIDINE IN NACL 20-0.9 MG/50ML-% IV SOLN
20.0000 mg | Freq: Once | INTRAVENOUS | Status: AC
  Administered 2018-08-17: 20 mg via INTRAVENOUS

## 2018-08-17 MED ORDER — HEPARIN SOD (PORK) LOCK FLUSH 100 UNIT/ML IV SOLN
500.0000 [IU] | Freq: Once | INTRAVENOUS | Status: AC | PRN
  Administered 2018-08-17: 500 [IU]
  Filled 2018-08-17: qty 5

## 2018-08-17 MED ORDER — DEXAMETHASONE SODIUM PHOSPHATE 10 MG/ML IJ SOLN
4.0000 mg | Freq: Once | INTRAMUSCULAR | Status: AC
  Administered 2018-08-17: 4 mg via INTRAVENOUS

## 2018-08-17 MED ORDER — SODIUM CHLORIDE 0.9 % IV SOLN
Freq: Once | INTRAVENOUS | Status: AC
  Administered 2018-08-17: 13:00:00 via INTRAVENOUS
  Filled 2018-08-17: qty 250

## 2018-08-17 MED ORDER — SODIUM CHLORIDE 0.9 % IV SOLN
152.3200 mg | Freq: Once | INTRAVENOUS | Status: AC
  Administered 2018-08-17: 150 mg via INTRAVENOUS
  Filled 2018-08-17: qty 15

## 2018-08-17 MED ORDER — SODIUM CHLORIDE 0.9% FLUSH
10.0000 mL | INTRAVENOUS | Status: DC | PRN
  Administered 2018-08-17: 10 mL
  Filled 2018-08-17: qty 10

## 2018-08-17 MED ORDER — PEGFILGRASTIM 6 MG/0.6ML ~~LOC~~ PSKT
PREFILLED_SYRINGE | SUBCUTANEOUS | Status: AC
  Filled 2018-08-17: qty 0.6

## 2018-08-17 MED ORDER — SODIUM CHLORIDE 0.9 % IV SOLN
640.0000 mg/m2 | Freq: Once | INTRAVENOUS | Status: AC
  Administered 2018-08-17: 1254 mg via INTRAVENOUS
  Filled 2018-08-17: qty 32.98

## 2018-08-17 MED ORDER — DIPHENHYDRAMINE HCL 50 MG/ML IJ SOLN
25.0000 mg | Freq: Once | INTRAMUSCULAR | Status: AC
  Administered 2018-08-17: 25 mg via INTRAVENOUS

## 2018-08-17 MED ORDER — DIPHENHYDRAMINE HCL 50 MG/ML IJ SOLN
INTRAMUSCULAR | Status: AC
  Filled 2018-08-17: qty 1

## 2018-08-17 MED ORDER — FAMOTIDINE IN NACL 20-0.9 MG/50ML-% IV SOLN
INTRAVENOUS | Status: AC
  Filled 2018-08-17: qty 50

## 2018-08-17 NOTE — Telephone Encounter (Signed)
Per MD ok to treat day 8 cycle 12 despite ANC of 1.1 - pt has order for neulasta support post d8 treatment.  Above called to treatment room nurse.

## 2018-08-17 NOTE — Patient Instructions (Signed)
[facility] Discharge Instructions for Patients Receiving Chemotherapy  Today you received the following chemotherapy agents Carboplatin and Gemzar  To help prevent nausea and vomiting after your treatment, we encourage you to take your nausea medication as directed.  If you develop nausea and vomiting that is not controlled by your nausea medication, call the clinic.   BELOW ARE SYMPTOMS THAT SHOULD BE REPORTED IMMEDIATELY:  *FEVER GREATER THAN 100.5 F  *CHILLS WITH OR WITHOUT FEVER  NAUSEA AND VOMITING THAT IS NOT CONTROLLED WITH YOUR NAUSEA MEDICATION  *UNUSUAL SHORTNESS OF BREATH  *UNUSUAL BRUISING OR BLEEDING  TENDERNESS IN MOUTH AND THROAT WITH OR WITHOUT PRESENCE OF ULCERS  *URINARY PROBLEMS  *BOWEL PROBLEMS  UNUSUAL RASH Items with * indicate a potential emergency and should be followed up as soon as possible.  Feel free to call the clinic should you have any questions or concerns. The clinic phone number is [phone].  Please show the CHEMO ALERT CARD at check-in to the Emergency Department and triage nurse.   

## 2018-08-17 NOTE — Progress Notes (Signed)
[place]  [phone] Fax:(336) [phone]     ID: Samantha Sullivan DOB: November 21, 1951  MR#: [id number]  ZYS#:[id number]  Patient Care Team: Shon Baton, MD as PCP - General (Internal Medicine) Magrinat, Virgie Dad, MD as Consulting Physician (Oncology) Kem Boroughs, [place] as Nurse Practitioner (Family Medicine) Regal, Tamala Fothergill, DPM as Consulting Physician (Podiatry) Excell Seltzer, MD as Consulting Physician (General Surgery) Force, Shari Prows, DO as Referring Physician (Student) PCP: Shon Baton, MD OTHER MD:  CHIEF COMPLAINT: Contralateral Sullivan cancer, triple negative  CURRENT TREATMENT: Completing adjuvant chemotherapy  INTERVAL HISTORY: Samantha Sullivan "Samantha Sullivan" returns today for follow-up and treatment of her contralateral Sullivan cancer, triple negative.  The patient continues on carboplatin and gemcitabine, Samantha she now receives days 1 and 8 of each 21-day cycle, and Neulasta/OnPro on day 9. Today is day 1 cycle 12 of 12 planned cycles. She tolerates this well.    REVIEW OF SYSTEMS: Samantha Sullivan is feeling well today.  She continues to have a mild intermittent peripheral neuropathy without motor deficits.  She denies any new issues, and is looking forward to completing her last chemotherapy today.  She is redecorating her house with her daughter and is working towards a fresh start.  She will restart Anastrozole on 10/1.  Samantha Sullivan is doing well and otherwise a detailed ROS was non contributory.     RIGHT Sullivan CANCER HISTORY:  From the original intake note:  "Samantha Sullivan"noted a lump in her right Sullivan sometime in late October or early November, but there were "so many things going on in her life" she did not bring it to medical attention until she saw Samantha Sullivan 12/11/2015. Samantha Sullivan, [facility]27/[facility]. This found the Sullivan density to be category C. There was a new oval mass in the right Sullivan upper outer quadrant. Ultrasound the same day confirmed a 2.2 cm mass with irregular margins in the right Sullivan at the 9:00 position read as highly suggestive of malignancy.  Biopsy of the right Sullivan mass in question 01/02/2016 found ([id number]) an invasive ductal carcinoma, grade 1, estrogen receptor 100% positive, progesterone receptor 90% positive, both with strong staining intensity, with an MIB-1 of 10%, and no HER-2 amplification, the signals ratio being 1.32 and the number per cell 2.05. The patient was informed and instructed to stop hormone replacement therapy (last dose 01/04/2016).  Her subsequent history is as detailed below.    PAST MEDICAL HISTORY: Past Medical History:  Diagnosis Date  . Abnormal CT of the chest 12/06   numeroous lung nodules - most have resolved.  . Arthritis    psoriatic arthritis- toes & feet, but resolved at this point- no need meds at this point    . Cancer [facility])    right Sullivan cancer  . Complication of anesthesia    /w colonoscopy- woke up before procedure was complete  . Family history of adverse reaction to anesthesia    pt's mother reported difficulty being given " enough " medicine to outcome a successful anesth. experience  . Family history of leukemia   . Family history of ovarian cancer   . History of meningioma 2007   in 2013 stable on MRI and recheck in  5 years, no surgery necessry   . HSV infection 1970's- 1980's  . Psoriatic arthritis ([place])     PAST SURGICAL HISTORY: Past Surgical History:  Procedure Laterality Date  . BILATERAL TOTAL MASTECTOMY WITH AXILLARY LYMPH NODE DISSECTION Bilateral 01/04/2018   Procedure: BILATERAL TOTAL MASTECTOMY PROPHYLATIC RIGHT  WITH LEFT SENTINEAL  LYMPH NODE;  Surgeon:  Excell Seltzer, MD;  Location: [place];  Service: General;  Laterality: Bilateral;  . Sullivan LUMPECTOMY WITH RADIOACTIVE SEED AND SENTINEL LYMPH NODE BIOPSY Right 01/22/2016   Procedure: RIGHT Sullivan LUMPECTOMY WITH RADIOACTIVE SEED AND SENTINEL LYMPH NODE BIOPSY;  Surgeon: Excell Seltzer, MD;  Location: [place];  Service: General;  Laterality: Right;  . Sullivan SURGERY Left 1971, 03/10/2015   lumpectomy benign, 2015/03/10- [facility]- R lumpectomy & radiation   . COLONOSCOPY  8/06   normal and recheck in 10 years  . HAMMER TOE SURGERY Left 2015-03-10  . MASTECTOMY Right 01/04/2018   PROPHYLATIC   . MASTECTOMY COMPLETE / SIMPLE W/ SENTINEL NODE BIOPSY Left 01/04/2018  . PORTACATH PLACEMENT Right 01/04/2018   Procedure: INSERTION PORT-A-CATH;  Surgeon: Excell Seltzer, MD;  Location: [place];  Service: General;  Laterality: Right;  . THYROIDECTOMY, PARTIAL Left 1983    FAMILY HISTORY Family History  Problem Relation Age of Onset  . Osteoporosis Mother   . Osteoporosis Sister   . Lung cancer Maternal Aunt        smoker  . Heart Problems Maternal Grandmother   . COPD Maternal Grandfather   . Neurodegenerative disease Maternal Aunt        corical-ganglion degeneration  . Ovarian cancer Maternal Aunt        dx in her 83s  . Leukemia Cousin        17s-40s; maternal cousin  Samantha Sullivan has essentially no information on her father's side of the family aside from the fact that he died in his 69s from heart disease. He had psoriasis. The patient's mother passed away 2017-03-09 age 42, due to Sullivan cancer. The patient had 2 brothers who died from congenital heart disease shortly after birth. The patient has one sister. The patient's mother had one sister, the patient's aunt, diagnosed with ovarian cancer in her 61s.  GYNECOLOGIC HISTORY:  Patient's last menstrual period was 03/22/2000 (approximate). Menarche age 11, first live birth age 67. The patient is GX P2. She went through menopause  approximately Mar 09, 1989 but was taking hormone replacement until 01/04/2016. Since coming off replacement she has had insomnia but no hot flashes or vaginal dryness pleural bones so 4. 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 She is divorced and lives alone, with no pets. Her daughter [name] lives in [place]. She is disabled secondary to schizoaffective disorder. Daughter [name] lives in [place] and she is a Estate agent but mostly a homemaker. The patient has 3 grandchildren, the older 37, the other 2 being under 4 years old. The patient attends [employer]. Her mother passed away in March 09, 2017 due to Sullivan cancer.    ADVANCED DIRECTIVES: her daughter [name] is her healthcare power of attorney. She can be reached at [employer]: Social History   Tobacco Use  . Smoking status: Former Smoker    Packs/day: 1.00    Years: 12.00    Pack years: 12.00    Types:  Cigarettes    Last attempt to quit: 07/23/1981    Years since quitting: 37.0  . Smokeless tobacco: Never Used  Substance Use Topics  . Alcohol use: Yes    Alcohol/week: 0.0 standard drinks    Comment: social occasion only- special   . Drug use: No     Colonoscopy: ; [facility]  ALP:FXTKWIO 2016/[facility]  Bone density:rJanuary 2018/normal   Lipid panel:  Allergies  Allergen Reactions  . Bee Venom   . Ciprofloxacin   . Levaquin [Levofloxacin In D5w]   . Norco [Hydrocodone-Acetaminophen] Other (See Comments)    Pt states Norco made her extremely hyper after her surgery and she was not able to sleep.    Current Outpatient Medications  Medication Sig Dispense Refill  . anastrozole (ARIMIDEX) 1 MG tablet Take 1 tablet (1 mg total) by mouth daily. 90 tablet 4  . folic acid (FOLVITE) 1 MG tablet Take 2 mg by mouth every  evening.    . lidocaine-prilocaine (EMLA) cream Apply to affected area once 30 g 3  . Polyethyl Glycol-Propyl Glycol (LUBRICANT EYE DROPS) 0.4-0.3 % SOLN Place 1-2 drops into both eyes 3 (three) times daily as needed (dry eyes/irritated eyes.).    Marland Kitchen prochlorperazine (COMPAZINE) 10 MG tablet Take 1 tablet (10 mg total) by mouth every 6 (six) hours as needed (Nausea or vomiting). 30 tablet 1  . zolpidem (AMBIEN) 5 MG tablet Take 1-2 tablets (5-10 mg total) by mouth at bedtime as needed for sleep. 30 tablet 0   Current Facility-Administered Medications  Medication Dose Route Frequency Provider Last Rate Last Dose  . triamcinolone acetonide (KENALOG) 10 MG/ML injection 10 mg  10 mg Other Once Wallene Huh, DPM        OBJECTIVE:   Vitals:   08/17/18 1050  BP: (!) 149/83  Sullivan: 66  Resp: 16  Temp: (!) 97.5 F (36.4 C)  SpO2: 100%     Body mass index is 28.01 kg/m.   ECOG FS:1 - Symptomatic but completely ambulatory GENERAL: Patient is a well appearing female in no acute distress HEENT:  Sclerae anicteric.  Oropharynx clear and moist. No ulcerations or evidence of oropharyngeal candidiasis. Neck is supple.  NODES:  No cervical, supraclavicular, or axillary lymphadenopathy palpated.  Sullivan EXAM:  Deferred. LUNGS:  Clear to auscultation bilaterally.  No wheezes or rhonchi. HEART:  Regular rate and rhythm. No murmur appreciated. ABDOMEN:  Soft, nontender.  Positive, normoactive bowel sounds. No organomegaly palpated. MSK:  No focal spinal tenderness to palpation. Full range of motion bilaterally in the upper extremities. EXTREMITIES:  No peripheral edema.   SKIN:  Clear with no obvious rashes or skin changes. No nail dyscrasia. NEURO:  Nonfocal. Well oriented.  Appropriate affect.       LAB RESULTS:  CMP     Component Value Date/Time   NA 142 08/17/2018 0833   NA 141 08/05/2017 1335   K 3.9 08/17/2018 0833   K 4.5 08/05/2017 1335   CL 107 08/17/2018 0833   CO2 27  08/17/2018 0833   CO2 29 08/05/2017 1335   GLUCOSE 100 (H) 08/17/2018 0833   GLUCOSE 77 08/05/2017 1335   BUN 12 08/17/2018 0833   BUN 13.4 08/05/2017 1335   CREATININE 0.69 08/17/2018 0833   CREATININE 0.8 08/05/2017 1335   CALCIUM 9.4 08/17/2018 0833   CALCIUM 10.0 08/05/2017 1335   PROT 6.4 (L) 08/17/2018 0833   PROT 6.8 08/05/2017 1335   ALBUMIN 4.1 08/17/2018 0833   ALBUMIN 3.6  08/05/2017 1335   AST 27 08/17/2018 0833   AST 19 08/05/2017 1335   ALT 26 08/17/2018 0833   ALT 12 08/05/2017 1335   ALKPHOS 235 (H) 08/17/2018 0833   ALKPHOS 169 (H) 08/05/2017 1335   BILITOT 0.5 08/17/2018 0833   BILITOT 0.63 08/05/2017 1335   GFRNONAA >60 08/17/2018 0833   GFRAA >60 08/17/2018 0833    INo results found for: SPEP, UPEP  Lab Results  Component Value Date   WBC 2.3 (L) 08/17/2018   NEUTROABS 1.1 (L) 08/17/2018   HGB 10.2 (L) 08/17/2018   HCT 30.1 (L) 08/17/2018   MCV 100.3 08/17/2018   PLT 186 08/17/2018      Chemistry      Component Value Date/Time   NA 142 08/17/2018 0833   NA 141 08/05/2017 1335   K 3.9 08/17/2018 0833   K 4.5 08/05/2017 1335   CL 107 08/17/2018 0833   CO2 27 08/17/2018 0833   CO2 29 08/05/2017 1335   BUN 12 08/17/2018 0833   BUN 13.4 08/05/2017 1335   CREATININE 0.69 08/17/2018 0833   CREATININE 0.8 08/05/2017 1335      Component Value Date/Time   CALCIUM 9.4 08/17/2018 0833   CALCIUM 10.0 08/05/2017 1335   ALKPHOS 235 (H) 08/17/2018 0833   ALKPHOS 169 (H) 08/05/2017 1335   AST 27 08/17/2018 0833   AST 19 08/05/2017 1335   ALT 26 08/17/2018 0833   ALT 12 08/05/2017 1335   BILITOT 0.5 08/17/2018 0833   BILITOT 0.63 08/05/2017 1335       No results found for: LABCA2  No components found for: LABCA125  No results for input(s): INR in the last 168 hours.  Urinalysis    Component Value Date/Time   BILIRUBINUR neg 12/11/2015 0925   PROTEINUR neg 12/11/2015 0925   UROBILINOGEN negative 12/11/2015 0925   NITRITE neg 12/11/2015  0925   LEUKOCYTESUR Negative 12/11/2015 0925    STUDIES: No results found.  RESEARCH: enrolled in UPBEAT study; not a candidate for BR 003 because of concurrent cancer  ASSESSMENT: 67 y.o. [place] woman status post right Sullivan upper outer quadrant biopsy 01/02/2016 for a clinical T2 N0, stage IIA invasive ductal carcinoma, grade 1, estrogen and progesterone receptor positive, HER-2 not amplified, with an MIB-1 of 10%.  (1). Right lumpectomy and sentinel lymph node sampling 01/22/2016 showed a pT1c pN0, stage IA invasive ductal carcinoma, grade 1, with close but negative margins. Repeat HER-2 testing was again not amplified   (3) Oncotype score of 18 predicts a risk of recurrence outside the Sullivan within the next 10 years of 11% if the patient's only systemic therapy is tamoxifen for 5 years. It also predicts no significant benefit from chemotherapy.     (4) adjuvant radiation3/14/17-4/11/17 Site/dose:   Right Sullivan - 42.72 gy at 2.67 Gy per fraction x 16 fractions followed by an electron boost to the right Sullivan with 10 Gy at 2 Gy per fraction x 5 fractions.    (5)  Anastrozole started 04/21/2016, discontinued after approximately 1 month with significant side effects  (a)  Bone density at [facility] September 2013 was normal  (b) bone density January 2018 was normal  (c) anastrozole restarted February 2018, held again at the start of chemotherapy February 2019  (6) genetics testing  02/24/2016 through the Sullivan/Ovarian cancer gene panel offered by [facility] found no deleterious mutations in ATM, BARD1, BRCA1, BRCA2, BRIP1, CDH1, CHEK2, EPCAM, FANCC, MLH1, MSH2, MSH6, NBN, PALB2, PMS2, PTEN, RAD51C,  RAD51D, TP53, and XRCC2.   (7) left Sullivan biopsy 12/17/2017 showed a cT2 cN0, stage IIA- IIB invasive ductal carcinoma, grade 2, estrogen receptor weakly positive, progesterone receptor negative, with HER-2 equivocal by FISH, negative by immunohistochemistry  (8) status post  bilateral mastectomies and left sentinel lymph node sampling 01/04/2018 showing  (a) on the right, no evidence of disease  (b) on the left, a pT2 pN0, stage IIB invasive ductal carcinoma, grade 3, triple negative  (9) adjuvant chemotherapy consisting of cyclophosphamide and doxorubicin in dose dense fashion x4 started 02/16/2018, completed 03/30/2018,  followed by weekly paclitaxel /carboplatin x12 starting 04/13/2018;  (a) Paclitaxel/Carboplatin stopped after 3 cycles due to peripheral neuropathy.  (b)  Gemcitabine Carboplatin started on 5/21, but held x 3 weeks due to prolonged neutropenia  (c) carboplatin and gemcitabine given day 1 and day 8 with OnPro support for the remaining cycles   (10) to resume anastrozole 09/21/2018   PLAN:  Samantha Sullivan is doing well today.  Her labs remain stable.  Her ANC is borderline at 1.1, but ok for treatment.  She will receive Onpro.  She will proceed with her final Carboplatin and Gemcitabine today.  I congratulated her on her completion.    She has f/u with Dr. Jana Hakim in November.  She knows to call for any problems that may develop before that visit.  A total of (20) minutes of face-to-face time was spent with this patient with greater than 50% of that time in counseling and care-coordination.  Wilber Bihari, NP  08/17/18 10:54 AM Medical Oncology and Hematology [facility] [address] [place], [place] [address] Tel. [phone]    Fax. [phone]

## 2018-08-18 ENCOUNTER — Telehealth: Payer: Self-pay | Admitting: Adult Health

## 2018-08-18 NOTE — Telephone Encounter (Signed)
Per 8/27 no los °

## 2018-08-26 NOTE — Progress Notes (Signed)
report

## 2018-09-28 ENCOUNTER — Inpatient Hospital Stay: Payer: PPO | Attending: Oncology

## 2018-09-28 DIAGNOSIS — Z452 Encounter for adjustment and management of vascular access device: Secondary | ICD-10-CM | POA: Insufficient documentation

## 2018-09-28 DIAGNOSIS — C50411 Malignant neoplasm of upper-outer quadrant of right female breast: Secondary | ICD-10-CM | POA: Insufficient documentation

## 2018-09-28 DIAGNOSIS — C50912 Malignant neoplasm of unspecified site of left female breast: Secondary | ICD-10-CM | POA: Insufficient documentation

## 2018-09-28 DIAGNOSIS — Z95828 Presence of other vascular implants and grafts: Secondary | ICD-10-CM

## 2018-09-28 MED ORDER — SODIUM CHLORIDE 0.9% FLUSH
10.0000 mL | INTRAVENOUS | Status: DC | PRN
  Administered 2018-09-28: 10 mL
  Filled 2018-09-28: qty 10

## 2018-09-28 MED ORDER — HEPARIN SOD (PORK) LOCK FLUSH 100 UNIT/ML IV SOLN
500.0000 [IU] | Freq: Once | INTRAVENOUS | Status: AC | PRN
  Administered 2018-09-28: 500 [IU]
  Filled 2018-09-28: qty 5

## 2018-11-10 NOTE — Progress Notes (Signed)
[name]  [phone] [phone]     ID: Samantha Sullivan DOB: April 18, 1951  MR#: [id number]  RSW#:[id number]  Patient Care Team: Shon Baton, MD as PCP - General (Internal Medicine) Markisha Meding, Virgie Dad, MD as Consulting Physician (Oncology) Kem Boroughs, [place] as Nurse Practitioner (Family Medicine) Regal, Tamala Fothergill, DPM as Consulting Physician (Podiatry) Excell Seltzer, MD as Consulting Physician (General Surgery) Force, Shari Prows, DO as Referring Physician (Student) PCP: Shon Baton, MD OTHER MD:  CHIEF COMPLAINT: Left breast cancer, triple negative; right breast cancer estrogen receptor weakly positive  CURRENT TREATMENT: Observation  INTERVAL HISTORY: Samantha Sullivan "Samantha Sullivan" returns today for follow-up and treatment of her left breast cancer, which is triple negative.   The patient has been treated with carboplatin and gemcitabine,  days 1 and 8 of each 21-day cycle, with a final dose 08/17/2018.  Recall that her earlier breast cancer, on the right side, was estrogen receptor positive.  At her last visit here she was started on anastrozole, is tolerating well with no hot flashes or vaginal dryness.    REVIEW OF SYSTEMS: Samantha Sullivan is feeling well overall. She is gaining weight, but would like to lose 16 lbs. She is having sleep issues, and tends to eat a snack at night. For exercising, she is walking about 5 miles a day, three to six times a week. She also stretches. The patient denies unusual headaches, visual changes, nausea, vomiting, or dizziness. There has been no unusual cough, phlegm production, or pleurisy. This been no change in bowel or bladder habits. The patient denies unexplained fatigue or unexplained weight loss, bleeding, rash, or fever. A detailed review of systems was otherwise noncontributory.      RIGHT BREAST CANCER HISTORY:  From the original intake note:  "[name]"noted a lump in her right breast sometime in late October  or early November, but there were "so many things going on in her life" she did not bring it to medical attention until she saw [name] 12/11/2015. Ms Samantha Sullivan was able to palpate a mass at the 9:30 o'clock position in the right breast, which was mobile and nontender. The left breast is status post prior remote biopsy and there was some scar tissue at the 7:00 position. The patient was set up for bilateral diagnostic Mammography with tomosynthesis at [facility] 12/27/2015. This found the breast density to be category C. There was a new oval mass in the right breast upper outer quadrant. Ultrasound the same day confirmed a 2.2 cm mass with irregular margins in the right breast at the 9:00 position read as highly suggestive of malignancy.  Biopsy of the right breast mass in question 01/02/2016 found ([id number]) an invasive ductal carcinoma, grade 1, estrogen receptor 100% positive, progesterone receptor 90% positive, both with strong staining intensity, with an MIB-1 of 10%, and no HER-2 amplification, the signals ratio being 1.32 and the number per cell 2.05. The patient was informed and instructed to stop hormone replacement therapy (last dose 01/04/2016).  Her subsequent history is as detailed below.    PAST MEDICAL HISTORY: Past Medical History:  Diagnosis Date  . Abnormal CT of the chest 12/06   numeroous lung nodules - most have resolved.  . Arthritis    psoriatic arthritis- toes & feet, but resolved at this point- no need meds at this point    . Cancer [facility])    right breast cancer  . Complication of anesthesia    /w colonoscopy- woke up before procedure was complete  .  Family history of adverse reaction to anesthesia    pt's mother reported difficulty being given " enough " medicine to outcome a successful anesth. experience  . Family history of leukemia   . Family history of ovarian cancer   . History of meningioma 2007   in 2013 stable on MRI and recheck in 5 years, no surgery necessry     . HSV infection 1970's- 1980's  . Psoriatic arthritis ([place])     PAST SURGICAL HISTORY: Past Surgical History:  Procedure Laterality Date  . BILATERAL TOTAL MASTECTOMY WITH AXILLARY LYMPH NODE DISSECTION Bilateral 01/04/2018   Procedure: BILATERAL TOTAL MASTECTOMY PROPHYLATIC RIGHT  WITH LEFT SENTINEAL  LYMPH NODE;  Surgeon: Excell Seltzer, MD;  Location: [place];  Service: General;  Laterality: Bilateral;  . BREAST LUMPECTOMY WITH RADIOACTIVE SEED AND SENTINEL LYMPH NODE BIOPSY Right 01/22/2016   Procedure: RIGHT BREAST LUMPECTOMY WITH RADIOACTIVE SEED AND SENTINEL LYMPH NODE BIOPSY;  Surgeon: Excell Seltzer, MD;  Location: [place];  Service: General;  Laterality: Right;  . BREAST SURGERY Left 1971, 2016   lumpectomy benign, 2016- Br. Ca- R lumpectomy & radiation   . COLONOSCOPY  8/06   normal and recheck in 10 years  . HAMMER TOE SURGERY Left 2016  . MASTECTOMY Right 01/04/2018   PROPHYLATIC   . MASTECTOMY COMPLETE / SIMPLE W/ SENTINEL NODE BIOPSY Left 01/04/2018  . PORTACATH PLACEMENT Right 01/04/2018   Procedure: INSERTION PORT-A-CATH;  Surgeon: Excell Seltzer, MD;  Location: [place];  Service: General;  Laterality: Right;  . THYROIDECTOMY, PARTIAL Left 1983    FAMILY HISTORY Family History  Problem Relation Age of Onset  . Osteoporosis Mother   . Osteoporosis Sister   . Lung cancer Maternal Aunt        smoker  . Heart Problems Maternal Grandmother   . COPD Maternal Grandfather   . Neurodegenerative disease Maternal Aunt        corical-ganglion degeneration  . Ovarian cancer Maternal Aunt        dx in her 64s  . Leukemia Cousin        27s-40s; maternal cousin  Samantha Sullivan has essentially no information on her father's side of the family aside from the fact that he died in his 11s from heart disease. He had psoriasis. The patient's mother passed away 03-04-2017 age 39, due to breast cancer. The patient had 2 brothers who died from congenital heart disease  shortly after birth. The patient has one sister. The patient's mother had one sister, the patient's aunt, diagnosed with ovarian cancer in her 30s.  GYNECOLOGIC HISTORY:  Patient's last menstrual period was 03/22/2000 (approximate). Menarche age 11, first live birth age 65. The patient is GX P2. She went through menopause approximately 1990 but was taking hormone replacement until 01/04/2016. Since coming off replacement she has had insomnia but no hot flashes or vaginal dryness pleural bones so 4. She did take oral contraceptives remotely for more than 10 years with no complications  SOCIAL HISTORY: Updated November 2019 Cathy retired from being a Statistician, but then she was called back to work August 2019.Marland Kitchen She is divorced and lives alone, with no pets. Her daughter [name] lives in [place]. She is disabled secondary to schizoaffective disorder. Daughter [name] lives in [place] and she is a Estate agent but mostly a homemaker. The patient has 3 grandchildren, the older 19, the other 2 being under 61 years old. The patient attends [employer].  Her mother passed away in March 2018 due to breast cancer.    ADVANCED DIRECTIVES: her daughter [name] is her healthcare power of attorney. She can be reached at [place]: Social History   Tobacco Use  . Smoking status: Former Smoker    Packs/day: 1.00    Years: 12.00    Pack years: 12.00    Types: Cigarettes    Last attempt to quit: 07/23/1981    Years since quitting: 37.3  . Smokeless tobacco: Never Used  Substance Use Topics  . Alcohol use: Yes    Alcohol/week: 0.0 standard drinks    Comment: social occasion only- special   . Drug use: No     Colonoscopy: ; [facility]  IRS:WNIOEVO 2016/[facility]  Bone density:rJanuary 2018/normal   Lipid panel:  Allergies  Allergen Reactions  . Bee Venom   . Ciprofloxacin   . Levaquin [Levofloxacin In D5w]   .  Norco [Hydrocodone-Acetaminophen] Other (See Comments)    Pt states Norco made her extremely hyper after her surgery and she was not able to sleep.    Current Outpatient Medications  Medication Sig Dispense Refill  . anastrozole (ARIMIDEX) 1 MG tablet Take 1 tablet (1 mg total) by mouth daily. 90 tablet 4  . folic acid (FOLVITE) 1 MG tablet Take 2 mg by mouth every evening.    . lidocaine-prilocaine (EMLA) cream Apply to affected area once 30 g 3  . Polyethyl Glycol-Propyl Glycol (LUBRICANT EYE DROPS) 0.4-0.3 % SOLN Place 1-2 drops into both eyes 3 (three) times daily as needed (dry eyes/irritated eyes.).    Marland Kitchen prochlorperazine (COMPAZINE) 10 MG tablet Take 1 tablet (10 mg total) by mouth every 6 (six) hours as needed (Nausea or vomiting). 30 tablet 1  . zolpidem (AMBIEN) 5 MG tablet Take 1-2 tablets (5-10 mg total) by mouth at bedtime as needed for sleep. 30 tablet 0   Current Facility-Administered Medications  Medication Dose Route Frequency Provider Last Rate Last Dose  . triamcinolone acetonide (KENALOG) 10 MG/ML injection 10 mg  10 mg Other Once Wallene Huh, DPM        OBJECTIVE: Middle-aged white woman in no acute distress  Vitals:   11/11/18 1115  BP: (!) 149/89  Sullivan: 73  Resp: 18  Temp: 98.7 F (37.1 C)  SpO2: 100%     Body mass index is 29.4 kg/m.   ECOG FS:0 - Asymptomatic Filed Weights   11/11/18 1115  Weight: 176 lb 11.2 oz (80.2 kg)   Sclerae unicteric, EOMs intact Oropharynx clear and moist No cervical or supraclavicular adenopathy Lungs no rales or rhonchi Heart regular rate and rhythm Abd soft, nontender, positive bowel sounds MSK no focal spinal tenderness, no upper extremity lymphedema Neuro: nonfocal, well oriented, appropriate affect Breasts: Deferred; she is status post bilateral mastectomies  LAB RESULTS:  CMP     Component Value Date/Time   NA 140 11/11/2018 1058   NA 141 08/05/2017 1335   K 4.2 11/11/2018 1058   K 4.5 08/05/2017 1335     CL 106 11/11/2018 1058   CO2 26 11/11/2018 1058   CO2 29 08/05/2017 1335   GLUCOSE 82 11/11/2018 1058   GLUCOSE 77 08/05/2017 1335   BUN 16 11/11/2018 1058   BUN 13.4 08/05/2017 1335   CREATININE 0.73 11/11/2018 1058   CREATININE 0.8 08/05/2017 1335   CALCIUM 9.2 11/11/2018 1058   CALCIUM 10.0 08/05/2017 1335   PROT 6.5 11/11/2018 1058   PROT 6.8 08/05/2017 1335  ALBUMIN 3.7 11/11/2018 1058   ALBUMIN 3.6 08/05/2017 1335   AST 17 11/11/2018 1058   AST 19 08/05/2017 1335   ALT 11 11/11/2018 1058   ALT 12 08/05/2017 1335   ALKPHOS 231 (H) 11/11/2018 1058   ALKPHOS 169 (H) 08/05/2017 1335   BILITOT 0.3 11/11/2018 1058   BILITOT 0.63 08/05/2017 1335   GFRNONAA >60 11/11/2018 1058   GFRAA >60 11/11/2018 1058    INo results found for: SPEP, UPEP  Lab Results  Component Value Date   WBC 3.6 (L) 11/11/2018   NEUTROABS 2.2 11/11/2018   HGB 12.9 11/11/2018   HCT 38.8 11/11/2018   MCV 95.3 11/11/2018   PLT 158 11/11/2018      Chemistry      Component Value Date/Time   NA 140 11/11/2018 1058   NA 141 08/05/2017 1335   K 4.2 11/11/2018 1058   K 4.5 08/05/2017 1335   CL 106 11/11/2018 1058   CO2 26 11/11/2018 1058   CO2 29 08/05/2017 1335   BUN 16 11/11/2018 1058   BUN 13.4 08/05/2017 1335   CREATININE 0.73 11/11/2018 1058   CREATININE 0.8 08/05/2017 1335      Component Value Date/Time   CALCIUM 9.2 11/11/2018 1058   CALCIUM 10.0 08/05/2017 1335   ALKPHOS 231 (H) 11/11/2018 1058   ALKPHOS 169 (H) 08/05/2017 1335   AST 17 11/11/2018 1058   AST 19 08/05/2017 1335   ALT 11 11/11/2018 1058   ALT 12 08/05/2017 1335   BILITOT 0.3 11/11/2018 1058   BILITOT 0.63 08/05/2017 1335       No results found for: LABCA2  No components found for: LABCA125  No results for input(s): INR in the last 168 hours.  Urinalysis    Component Value Date/Time   BILIRUBINUR neg 12/11/2015 0925   PROTEINUR neg 12/11/2015 0925   UROBILINOGEN negative 12/11/2015 0925   NITRITE  neg 12/11/2015 0925   LEUKOCYTESUR Negative 12/11/2015 0925    STUDIES: No results found.  RESEARCH: enrolled in UPBEAT study; not a candidate for BR 003 because of concurrent cancer  ASSESSMENT: 67 y.o. [place] woman status post right breast upper outer quadrant biopsy 01/02/2016 for a clinical T2 N0, stage IIA invasive ductal carcinoma, grade 1, estrogen and progesterone receptor positive, HER-2 not amplified, with an MIB-1 of 10%.  (1). Right lumpectomy and sentinel lymph node sampling 01/22/2016 showed a pT1c pN0, stage IA invasive ductal carcinoma, grade 1, with close but negative margins. Repeat HER-2 testing was again not amplified   (3) Oncotype score of 18 predicts a risk of recurrence outside the breast within the next 10 years of 11% if the patient's only systemic therapy is tamoxifen for 5 years. It also predicts no significant benefit from chemotherapy.     (4) adjuvant radiation3/14/17-4/11/17 Site/dose:   Right breast - 42.72 gy at 2.67 Gy per fraction x 16 fractions followed by an electron boost to the right breast with 10 Gy at 2 Gy per fraction x 5 fractions.    (5)  Anastrozole started 04/21/2016, discontinued after approximately 1 month with significant side effects  (a)  Bone density at [facility] September 2013 was normal  (b) bone density January 2018 was normal  (c) anastrozole restarted February 2018, held again at the start of chemotherapy February 2019  (6) genetics testing  02/24/2016 through the Breast/Ovarian cancer gene panel offered by [facility] found no deleterious mutations in ATM, BARD1, BRCA1, BRCA2, BRIP1, CDH1, CHEK2, EPCAM, FANCC, MLH1, MSH2, MSH6,  NBN, PALB2, PMS2, PTEN, RAD51C, RAD51D, TP53, and XRCC2.   (7) left breast biopsy 12/17/2017 showed a cT2 cN0, stage IIA- IIB invasive ductal carcinoma, grade 2, estrogen receptor weakly positive, progesterone receptor negative, with HER-2 equivocal by FISH, negative by immunohistochemistry  (8)  status post bilateral mastectomies and left sentinel lymph node sampling 01/04/2018 showing  (a) on the right, no evidence of disease  (b) on the left, a pT2 pN0, stage IIB invasive ductal carcinoma, grade 3, triple negative  (9) adjuvant chemotherapy consisting of cyclophosphamide and doxorubicin in dose dense fashion x4 started 02/16/2018, completed 03/30/2018,  followed by weekly paclitaxel /carboplatin x12 starting 04/13/2018;  (a) Paclitaxel/Carboplatin stopped after 3 cycles due to peripheral neuropathy.  (b)  Gemcitabine Carboplatin started on 5/21, but held x 3 weeks due to prolonged neutropenia  (c) carboplatin and gemcitabine given day 1 and day 8 with OnPro support for the remaining cycles   (10) resumed anastrozole 09/21/2018   (a) DEXA scan in August 31, 2012 was normal   PLAN:  Samantha Sullivan is now just about a year out from definitive surgery for her breast cancer.  There is no evidence of disease activity.  This is particularly favorable because triple negative breast cancer if it is going to recur tends to recur early.  She is taking anastrozole to treat the earlier, right-sided breast cancer.  She is tolerating that well.  Her last bone density was 6 years ago.  She is going to have a new ones through her primary care physician sometime early next year.  She has a very good exercise program and has just [facility] [employer] [facility] [employer].  She would like to lose some weight and we discussed some diet changes she could implement.  She would like to keep her port for now.  We are going to flush it every 6 weeks.  She wanted to know her prognosis.  As far as her triple negative breast cancer I quoted her on 80 to 85% chance of it not recurring within the next 5 years.  Overall I am delighted at how well she is doing.  She will return to see me in May.  She knows to call for any other issues that may develop before that visit.  Laurajean Hosek, Virgie Dad, MD  11/11/18 11:41 AM Medical Oncology  and Hematology [facility] [address] [place], [place] [address] Tel. [phone]    Fax. [phone]   I, Jacqualyn Posey am acting as a Education administrator for Chauncey Cruel, MD.   I, Lurline Del MD, have reviewed the above documentation for accuracy and completeness, and I agree with the above.

## 2018-11-11 ENCOUNTER — Telehealth: Payer: Self-pay | Admitting: Oncology

## 2018-11-11 ENCOUNTER — Inpatient Hospital Stay: Payer: PPO

## 2018-11-11 ENCOUNTER — Inpatient Hospital Stay: Payer: PPO | Attending: Oncology

## 2018-11-11 ENCOUNTER — Inpatient Hospital Stay (HOSPITAL_BASED_OUTPATIENT_CLINIC_OR_DEPARTMENT_OTHER): Payer: PPO | Admitting: Oncology

## 2018-11-11 VITALS — BP 149/89 | HR 73 | Temp 98.7°F | Resp 18 | Ht 65.0 in | Wt 176.7 lb

## 2018-11-11 DIAGNOSIS — C50411 Malignant neoplasm of upper-outer quadrant of right female breast: Secondary | ICD-10-CM | POA: Diagnosis not present

## 2018-11-11 DIAGNOSIS — Z170 Estrogen receptor positive status [ER+]: Secondary | ICD-10-CM | POA: Insufficient documentation

## 2018-11-11 DIAGNOSIS — Z171 Estrogen receptor negative status [ER-]: Secondary | ICD-10-CM

## 2018-11-11 DIAGNOSIS — Z9221 Personal history of antineoplastic chemotherapy: Secondary | ICD-10-CM

## 2018-11-11 DIAGNOSIS — C50912 Malignant neoplasm of unspecified site of left female breast: Secondary | ICD-10-CM

## 2018-11-11 DIAGNOSIS — Z79899 Other long term (current) drug therapy: Secondary | ICD-10-CM | POA: Diagnosis not present

## 2018-11-11 DIAGNOSIS — Z9013 Acquired absence of bilateral breasts and nipples: Secondary | ICD-10-CM | POA: Diagnosis not present

## 2018-11-11 DIAGNOSIS — Z95828 Presence of other vascular implants and grafts: Secondary | ICD-10-CM

## 2018-11-11 DIAGNOSIS — Z87891 Personal history of nicotine dependence: Secondary | ICD-10-CM | POA: Diagnosis not present

## 2018-11-11 DIAGNOSIS — Z8041 Family history of malignant neoplasm of ovary: Secondary | ICD-10-CM

## 2018-11-11 DIAGNOSIS — Z806 Family history of leukemia: Secondary | ICD-10-CM | POA: Diagnosis not present

## 2018-11-11 DIAGNOSIS — F259 Schizoaffective disorder, unspecified: Secondary | ICD-10-CM | POA: Diagnosis not present

## 2018-11-11 DIAGNOSIS — Z803 Family history of malignant neoplasm of breast: Secondary | ICD-10-CM | POA: Diagnosis not present

## 2018-11-11 DIAGNOSIS — Z79811 Long term (current) use of aromatase inhibitors: Secondary | ICD-10-CM

## 2018-11-11 DIAGNOSIS — C50812 Malignant neoplasm of overlapping sites of left female breast: Secondary | ICD-10-CM

## 2018-11-11 LAB — CBC WITH DIFFERENTIAL (CANCER CENTER ONLY)
ABS IMMATURE GRANULOCYTES: 0.01 10*3/uL (ref 0.00–0.07)
Basophils Absolute: 0 10*3/uL (ref 0.0–0.1)
Basophils Relative: 1 %
Eosinophils Absolute: 0.1 10*3/uL (ref 0.0–0.5)
Eosinophils Relative: 1 %
HEMATOCRIT: 38.8 % (ref 36.0–46.0)
HEMOGLOBIN: 12.9 g/dL (ref 12.0–15.0)
Immature Granulocytes: 0 %
LYMPHS PCT: 24 %
Lymphs Abs: 0.9 10*3/uL (ref 0.7–4.0)
MCH: 31.7 pg (ref 26.0–34.0)
MCHC: 33.2 g/dL (ref 30.0–36.0)
MCV: 95.3 fL (ref 80.0–100.0)
MONO ABS: 0.5 10*3/uL (ref 0.1–1.0)
MONOS PCT: 12 %
NEUTROS ABS: 2.2 10*3/uL (ref 1.7–7.7)
Neutrophils Relative %: 62 %
Platelet Count: 158 10*3/uL (ref 150–400)
RBC: 4.07 MIL/uL (ref 3.87–5.11)
RDW: 11.6 % (ref 11.5–15.5)
WBC Count: 3.6 10*3/uL — ABNORMAL LOW (ref 4.0–10.5)
nRBC: 0 % (ref 0.0–0.2)

## 2018-11-11 LAB — CMP (CANCER CENTER ONLY)
ALBUMIN: 3.7 g/dL (ref 3.5–5.0)
ALT: 11 U/L (ref 0–44)
AST: 17 U/L (ref 15–41)
Alkaline Phosphatase: 231 U/L — ABNORMAL HIGH (ref 38–126)
Anion gap: 8 (ref 5–15)
BILIRUBIN TOTAL: 0.3 mg/dL (ref 0.3–1.2)
BUN: 16 mg/dL (ref 8–23)
CHLORIDE: 106 mmol/L (ref 98–111)
CO2: 26 mmol/L (ref 22–32)
Calcium: 9.2 mg/dL (ref 8.9–10.3)
Creatinine: 0.73 mg/dL (ref 0.44–1.00)
GFR, Est AFR Am: >60 mL/min (ref >60)
GFR, Estimated: >60 mL/min (ref >60)
GLUCOSE: 82 mg/dL (ref 70–99)
POTASSIUM: 4.2 mmol/L (ref 3.5–5.1)
Sodium: 140 mmol/L (ref 135–145)
Total Protein: 6.5 g/dL (ref 6.5–8.1)

## 2018-11-11 MED ORDER — HEPARIN SOD (PORK) LOCK FLUSH 100 UNIT/ML IV SOLN
500.0000 [IU] | Freq: Once | INTRAVENOUS | Status: AC | PRN
  Administered 2018-11-11: 500 [IU]
  Filled 2018-11-11: qty 5

## 2018-11-11 MED ORDER — ANASTROZOLE 1 MG PO TABS: 1.0000 mg | ORAL_TABLET | Freq: Every day | ORAL | 4 refills | Status: DC

## 2018-11-11 MED ORDER — SODIUM CHLORIDE 0.9% FLUSH
10.0000 mL | INTRAVENOUS | Status: DC | PRN
  Administered 2018-11-11: 10 mL
  Filled 2018-11-11: qty 10

## 2018-11-11 MED ORDER — VITAMIN D 25 MCG (1000 UNIT) PO TABS: 1000.0000 [IU] | ORAL_TABLET | Freq: Every day | ORAL | 4 refills | Status: DC

## 2018-11-11 NOTE — Telephone Encounter (Signed)
Gave avs and calendar ° °

## 2018-12-23 ENCOUNTER — Inpatient Hospital Stay: Payer: PPO | Attending: Oncology

## 2018-12-23 DIAGNOSIS — Z95828 Presence of other vascular implants and grafts: Secondary | ICD-10-CM

## 2018-12-23 DIAGNOSIS — Z87891 Personal history of nicotine dependence: Secondary | ICD-10-CM | POA: Insufficient documentation

## 2018-12-23 DIAGNOSIS — Z171 Estrogen receptor negative status [ER-]: Secondary | ICD-10-CM | POA: Diagnosis not present

## 2018-12-23 DIAGNOSIS — Z79811 Long term (current) use of aromatase inhibitors: Secondary | ICD-10-CM | POA: Insufficient documentation

## 2018-12-23 DIAGNOSIS — Z79899 Other long term (current) drug therapy: Secondary | ICD-10-CM | POA: Diagnosis not present

## 2018-12-23 DIAGNOSIS — Z803 Family history of malignant neoplasm of breast: Secondary | ICD-10-CM | POA: Diagnosis not present

## 2018-12-23 DIAGNOSIS — Z170 Estrogen receptor positive status [ER+]: Secondary | ICD-10-CM | POA: Diagnosis not present

## 2018-12-23 DIAGNOSIS — Z9013 Acquired absence of bilateral breasts and nipples: Secondary | ICD-10-CM | POA: Insufficient documentation

## 2018-12-23 DIAGNOSIS — C50912 Malignant neoplasm of unspecified site of left female breast: Secondary | ICD-10-CM | POA: Diagnosis not present

## 2018-12-23 DIAGNOSIS — C50411 Malignant neoplasm of upper-outer quadrant of right female breast: Secondary | ICD-10-CM | POA: Insufficient documentation

## 2018-12-23 DIAGNOSIS — Z8041 Family history of malignant neoplasm of ovary: Secondary | ICD-10-CM | POA: Insufficient documentation

## 2018-12-23 DIAGNOSIS — Z9221 Personal history of antineoplastic chemotherapy: Secondary | ICD-10-CM | POA: Diagnosis not present

## 2018-12-23 DIAGNOSIS — F259 Schizoaffective disorder, unspecified: Secondary | ICD-10-CM | POA: Insufficient documentation

## 2018-12-23 DIAGNOSIS — Z806 Family history of leukemia: Secondary | ICD-10-CM | POA: Insufficient documentation

## 2018-12-23 MED ORDER — SODIUM CHLORIDE 0.9% FLUSH
10.0000 mL | INTRAVENOUS | Status: DC | PRN
  Administered 2018-12-23: 10 mL
  Filled 2018-12-23: qty 10

## 2018-12-23 MED ORDER — HEPARIN SOD (PORK) LOCK FLUSH 100 UNIT/ML IV SOLN
500.0000 [IU] | Freq: Once | INTRAVENOUS | Status: AC | PRN
  Administered 2018-12-23: 500 [IU]
  Filled 2018-12-23: qty 5

## 2019-02-01 DIAGNOSIS — Z13820 Encounter for screening for osteoporosis: Secondary | ICD-10-CM | POA: Diagnosis not present

## 2019-02-03 ENCOUNTER — Inpatient Hospital Stay: Payer: PPO | Attending: Oncology

## 2019-02-03 DIAGNOSIS — C50411 Malignant neoplasm of upper-outer quadrant of right female breast: Secondary | ICD-10-CM | POA: Diagnosis not present

## 2019-02-03 DIAGNOSIS — Z452 Encounter for adjustment and management of vascular access device: Secondary | ICD-10-CM | POA: Insufficient documentation

## 2019-02-03 DIAGNOSIS — Z171 Estrogen receptor negative status [ER-]: Secondary | ICD-10-CM | POA: Diagnosis not present

## 2019-02-03 DIAGNOSIS — Z95828 Presence of other vascular implants and grafts: Secondary | ICD-10-CM

## 2019-02-03 DIAGNOSIS — C50912 Malignant neoplasm of unspecified site of left female breast: Secondary | ICD-10-CM | POA: Diagnosis not present

## 2019-02-03 DIAGNOSIS — Z170 Estrogen receptor positive status [ER+]: Secondary | ICD-10-CM | POA: Insufficient documentation

## 2019-02-03 MED ORDER — SODIUM CHLORIDE 0.9% FLUSH
10.0000 mL | INTRAVENOUS | Status: DC | PRN
  Administered 2019-02-03: 10 mL
  Filled 2019-02-03: qty 10

## 2019-02-03 MED ORDER — HEPARIN SOD (PORK) LOCK FLUSH 100 UNIT/ML IV SOLN
500.0000 [IU] | Freq: Once | INTRAVENOUS | Status: AC | PRN
  Administered 2019-02-03: 500 [IU]
  Filled 2019-02-03: qty 5

## 2019-02-15 ENCOUNTER — Inpatient Hospital Stay: Payer: PPO | Admitting: *Deleted

## 2019-02-15 ENCOUNTER — Other Ambulatory Visit: Payer: Self-pay | Admitting: *Deleted

## 2019-02-15 VITALS — BP 125/79 | HR 71 | Temp 97.8°F | Resp 16 | Ht 65.0 in | Wt 179.1 lb

## 2019-02-15 DIAGNOSIS — C50411 Malignant neoplasm of upper-outer quadrant of right female breast: Secondary | ICD-10-CM

## 2019-02-15 DIAGNOSIS — Z170 Estrogen receptor positive status [ER+]: Principal | ICD-10-CM

## 2019-02-15 NOTE — Research (Signed)
[place] 1 Follow Up  02/15/19 Patient Samantha Sullivan presents today unaccompanied for the purpose of Year 1 Follow up on the UPBEAT study.  The patient states that overall she feels "great," though she was not feeling great today due to not sleeping well.    Informed Consent - At the onset of the patient visit, this RN explained to the patient that the study had been updated and the consent form had updated as well.  This RN reviewed the changes with the patient and provided the patient with the informed consent form, [place] active date of 11/10/2018, to review.  The patient reviewed the form in full, denied any questions or concerns, and verbalizes an understanding of the change.  The patient understands that text messages will not be utilized as offered in the consent form so the patient elected to opt "No" to receive text messages.  The patient voluntarily signed the updated informed consent form today at 9:30am.  The patient was provided with a copy of the signed informed consent form.  Labs - Patient has a flush visit planned for 03/17/19; this is within window for her Year 1 follow up.  Patient prefers to wait until her scheduled flush appointment to collect her research lab work.  Patient verbalized understanding of the fasting requirement prior to that blood draw.  This RN has requested a lab appointment be added to the patient's schedule so that [place] will be drawn on 03/17/19 at the time of routine flush visit.    Neurocognitive Testing - The patient completed her neurocognitive testing with [facility].  Questionnaires - Patient completed the Year 1 Patient Reported Outcomes questionnaires today.  This RN reviewed for completeness.  Patient does not have a score on the depression scale which requires physician follow up.  Physical Functions Testing - Physical Functions Testing was performed by this RN.  Data recorded on study provided worksheets.    Vital Signs  - Patient's vital signs were measured by this RN, and are recorded in this encounter.  Patient rested in a seated position for a full five minutes prior to blood pressure and heart rate measurements.  BP and HR were repeated in one minute after first measurement.  Patient's waist measured 34."    Visit Scheduling - Patient was thanked for her ongoing participation in the UPBEAT study.  Patient understands to be fasting prior to her research lab work, to be drawn at routine flush visit.  Patient understands that her next study visit will take place in approximately one year.  Doreatha Martin, RN, BSN, [facility] 02/15/2019 12:30 PM

## 2019-03-16 ENCOUNTER — Telehealth: Payer: Self-pay | Admitting: *Deleted

## 2019-03-16 ENCOUNTER — Other Ambulatory Visit: Payer: Self-pay | Admitting: *Deleted

## 2019-03-16 DIAGNOSIS — Z170 Estrogen receptor positive status [ER+]: Principal | ICD-10-CM

## 2019-03-16 DIAGNOSIS — C50411 Malignant neoplasm of upper-outer quadrant of right female breast: Secondary | ICD-10-CM

## 2019-03-16 NOTE — Telephone Encounter (Signed)
Received call from patient stating she is coming in tomorrow for research labs and would like to have a CBC and CMET drawn as well.  Orders placed for the labs to be drawn tomorrow.

## 2019-03-16 NOTE — Telephone Encounter (Signed)
UPBEAT Month 12 Labs:  03/16/19  This RN discussed this patient's plan for routine flush tomorrow with add on Research Labs and patient requested add on CBC with diff and CMP.  Dr. Jana Hakim confirmed that the patient did not need to just come in for the routine flush, but if she would like to come in and have lab work completed that was acceptable.  This RN called and spoke with patient Samantha Sullivan.  Explained that the flush and research labs could be postponed until a later date due; many visits are being postponed due to the current concern about COVID-19.  The patient stated that she was uncomfortable not having her port flushed regularly and she does want the results of the patient requested CBC with differential and CMP.  The patient redundantly repeated that she wanted to keep her flush appointment tomorrow.  This RN confirmed that she could keep the lab and flush appointment on 3/26, and requested that the patient be fasting for 3 hours prior to the lab draw.  The patient verbalized an understanding.    Doreatha Martin, RN, BSN, [facility] 03/16/2019 4:46 PM

## 2019-03-17 ENCOUNTER — Other Ambulatory Visit: Payer: Self-pay

## 2019-03-17 ENCOUNTER — Encounter: Payer: Self-pay | Admitting: *Deleted

## 2019-03-17 ENCOUNTER — Inpatient Hospital Stay: Payer: PPO

## 2019-03-17 ENCOUNTER — Inpatient Hospital Stay: Payer: PPO | Attending: Oncology

## 2019-03-17 DIAGNOSIS — C50411 Malignant neoplasm of upper-outer quadrant of right female breast: Secondary | ICD-10-CM

## 2019-03-17 DIAGNOSIS — Z170 Estrogen receptor positive status [ER+]: Principal | ICD-10-CM

## 2019-03-17 NOTE — Research (Signed)
UPBEAT 12 Month Labs  03/17/19 Patient Samantha Sullivan arrives unaccompanied today for her routine lab and flush visit, where patient requested labs and 12 month labs were to be collected for the UPBEAT study.  Patient confirmed that she has been fasting.  Ms. Samantha Sullivan port visit was canceled, because there is a low supply of port access kits due to the COVD-19 situation, and so only patients who are being treated in the infusion room are having ports accessed until additional supply is available.  Patient Samantha Sullivan expressed her desire to have her port flushed, so phlebotomist reached out to the flush team and it was agreed that her port could be flushed due to patient request.  Research RN offered to have research labs drawn at a later date, but patient would like them drawn today with the requested CBC with diff and CMP.  After waiting, it was determined that was not possible today due to the low supply.    Because patient wanted CBC with diff and CMP results, she initially agreed to have lab work drawn without accessing her port.  Research RN again offered to reschedule her research labs to a later date and patient initially denied.  After reconsideration, patient elected to not have phlebotomy performed today and so no labs were drawn today.  This RN informed the patient that she would be contacted by scheduling to reschedule her port flush.   This RN apologized for the inconvenience and thanked the patient for her ongoing participation in the UPBEAT study.  Because labs were not drawn, gift card, laminated card, and tote bag were not given to patient today.  Samantha Martin, RN, BSN, [facility] 03/17/2019 3:52 PM

## 2019-03-23 ENCOUNTER — Telehealth: Payer: Self-pay

## 2019-03-23 NOTE — Telephone Encounter (Signed)
Left a voicemail for patient to please come in for tomorrow at 9am port flush, it was my mistake that it was cancelled and I apologized.

## 2019-03-24 ENCOUNTER — Inpatient Hospital Stay: Payer: PPO

## 2019-03-24 ENCOUNTER — Other Ambulatory Visit: Payer: Self-pay

## 2019-03-24 ENCOUNTER — Inpatient Hospital Stay: Payer: PPO | Attending: Oncology

## 2019-03-24 DIAGNOSIS — C50411 Malignant neoplasm of upper-outer quadrant of right female breast: Secondary | ICD-10-CM | POA: Insufficient documentation

## 2019-03-24 DIAGNOSIS — Z95828 Presence of other vascular implants and grafts: Secondary | ICD-10-CM

## 2019-03-24 DIAGNOSIS — C50912 Malignant neoplasm of unspecified site of left female breast: Secondary | ICD-10-CM | POA: Insufficient documentation

## 2019-03-24 DIAGNOSIS — Z452 Encounter for adjustment and management of vascular access device: Secondary | ICD-10-CM | POA: Insufficient documentation

## 2019-03-24 MED ORDER — SODIUM CHLORIDE 0.9% FLUSH
10.0000 mL | INTRAVENOUS | Status: DC | PRN
  Administered 2019-03-24: 10 mL
  Filled 2019-03-24: qty 10

## 2019-03-24 MED ORDER — HEPARIN SOD (PORK) LOCK FLUSH 100 UNIT/ML IV SOLN
500.0000 [IU] | Freq: Once | INTRAVENOUS | Status: AC | PRN
  Administered 2019-03-24: 500 [IU]
  Filled 2019-03-24: qty 5

## 2019-05-03 ENCOUNTER — Telehealth: Payer: Self-pay | Admitting: Oncology

## 2019-05-03 NOTE — Telephone Encounter (Signed)
Tried calling pt re 05/05/19 appt but mailbox was full and couldn't leave msg.

## 2019-05-04 ENCOUNTER — Telehealth: Payer: Self-pay | Admitting: Oncology

## 2019-05-04 NOTE — Progress Notes (Signed)
[place]  [phone] [phone]     ID: Samantha Sullivan DOB: 11-10-1951  MR#: [id number]  WLN#:[id number]  Patient Care Team: Shon Baton, MD as PCP - General (Internal Medicine) Adante Courington, Virgie Dad, MD as Consulting Physician (Oncology) Kem Boroughs, [place] as Nurse Practitioner (Family Medicine) Regal, Tamala Fothergill, DPM as Consulting Physician (Podiatry) Excell Seltzer, MD as Consulting Physician (General Surgery) Force, Shari Prows, DO as Referring Physician (Student) PCP: Shon Baton, MD OTHER MD:   CHIEF COMPLAINT: Left breast cancer, triple negative; right breast cancer estrogen receptor weakly positive  CURRENT TREATMENT: anastrozole   INTERVAL HISTORY: Samantha Sullivan "Samantha Sullivan" returns today for follow-up and treatment of her left breast cancer, which is triple negative.   She continues on anastrozole, with good tolerance. She denies issues with hot flashes and vaginal dryness.  Since her last visit, she has not undergone any additional studies.  Recall she is status post bilateral mastectomies.  She will need a DEXA scan sometime this year  REVIEW OF SYSTEMS: Samantha Sullivan reports doing well overall. She notes she got a new pillow, which has improved her sleep. She has been able to complete some chores around her house due to staying in. She states she video chats with her kids. The patient denies unusual headaches, visual changes, nausea, vomiting, stiff neck, dizziness, or gait imbalance. There has been no cough, phlegm production, or pleurisy, no chest pain or pressure, and no change in bowel or bladder habits. The patient denies fever, rash, bleeding, unexplained fatigue or unexplained weight loss. A detailed review of systems was otherwise entirely negative.     RIGHT BREAST CANCER HISTORY:  From the original intake note:  "Samantha Sullivan" noted a lump in her right breast sometime in late October or early November, but there were "so many things  going on in her life" she did not bring it to medical attention until she saw Samantha Sullivan 12/11/2015. Ms Samantha Sullivan was able to palpate a mass at the 9:30 o'clock position in the right breast, which was mobile and nontender. The left breast is status post prior remote biopsy and there was some scar tissue at the 7:00 position. The patient was set up for bilateral diagnostic Mammography with tomosynthesis at [facility] 12/27/2015. This found the breast density to be category C. There was a new oval mass in the right breast upper outer quadrant. Ultrasound the same day confirmed a 2.2 cm mass with irregular margins in the right breast at the 9:00 position read as highly suggestive of malignancy.  Biopsy of the right breast mass in question 01/02/2016 found ([id number]) an invasive ductal carcinoma, grade 1, estrogen receptor 100% positive, progesterone receptor 90% positive, both with strong staining intensity, with an MIB-1 of 10%, and no HER-2 amplification, the signals ratio being 1.32 and the number per cell 2.05. The patient was informed and instructed to stop hormone replacement therapy (last dose 01/04/2016).  Her subsequent history is as detailed below.    PAST MEDICAL HISTORY: Past Medical History:  Diagnosis Date  . Abnormal CT of the chest 12/06   numeroous lung nodules - most have resolved.  . Arthritis    psoriatic arthritis- toes & feet, but resolved at this point- no need meds at this point    . [facility]    right breast cancer  . Complication of anesthesia    /w colonoscopy- woke up before procedure was complete  . Family history of adverse reaction to anesthesia    pt's mother  reported difficulty being given " enough " medicine to outcome a successful anesth. experience  . Family history of leukemia   . Family history of ovarian cancer   . History of meningioma Mar 01, 2006   in 03/01/2012 stable on MRI and recheck in 5 years, no surgery necessry   . HSV infection 1970's- 1980's  . Psoriatic  arthritis ([place])     PAST SURGICAL HISTORY: Past Surgical History:  Procedure Laterality Date  . BILATERAL TOTAL MASTECTOMY WITH AXILLARY LYMPH NODE DISSECTION Bilateral 01/04/2018   Procedure: BILATERAL TOTAL MASTECTOMY PROPHYLATIC RIGHT  WITH LEFT SENTINEAL  LYMPH NODE;  Surgeon: Excell Seltzer, MD;  Location: [place];  Service: General;  Laterality: Bilateral;  . BREAST LUMPECTOMY WITH RADIOACTIVE SEED AND SENTINEL LYMPH NODE BIOPSY Right 01/22/2016   Procedure: RIGHT BREAST LUMPECTOMY WITH RADIOACTIVE SEED AND SENTINEL LYMPH NODE BIOPSY;  Surgeon: Excell Seltzer, MD;  Location: [place];  Service: General;  Laterality: Right;  . BREAST SURGERY Left 1971, 03/02/2015   lumpectomy benign, 03/02/2015- Br. Ca- R lumpectomy & radiation   . COLONOSCOPY  8/06   normal and recheck in 10 years  . HAMMER TOE SURGERY Left 2015-03-02  . MASTECTOMY Right 01/04/2018   PROPHYLATIC   . MASTECTOMY COMPLETE / SIMPLE W/ SENTINEL NODE BIOPSY Left 01/04/2018  . PORTACATH PLACEMENT Right 01/04/2018   Procedure: INSERTION PORT-A-CATH;  Surgeon: Excell Seltzer, MD;  Location: [place];  Service: General;  Laterality: Right;  . THYROIDECTOMY, PARTIAL Left 1983    FAMILY HISTORY Family History  Problem Relation Age of Onset  . Osteoporosis Mother   . Osteoporosis Sister   . Lung cancer Maternal Aunt        smoker  . Heart Problems Maternal Grandmother   . COPD Maternal Grandfather   . Neurodegenerative disease Maternal Aunt        corical-ganglion degeneration  . Ovarian cancer Maternal Aunt        dx in her 72s  . Leukemia Cousin        23s-40s; maternal cousin  Samantha Sullivan has essentially no information on her father's side of the family aside from the fact that he died in his 28s from heart disease. He had psoriasis. The patient's mother passed away 01-Mar-2017 age 44, due to breast cancer. The patient had 2 brothers who died from congenital heart disease shortly after birth. The patient has one sister.  The patient's mother had one sister, the patient's aunt, diagnosed with ovarian cancer in her 74s.   GYNECOLOGIC HISTORY:  Patient's last menstrual period was 03/22/2000 (approximate). Menarche age 49, first live birth age 39. The patient is GX P2. She went through menopause approximately March 01, 1989 but was taking hormone replacement until 01/04/2016. Since coming off replacement she has had insomnia but no hot flashes or vaginal dryness pleural bones so 4. She did take oral contraceptives remotely for more than 10 years with no complications   SOCIAL HISTORY: Updated November 2019 Cathy retired from being a Statistician, but then she was called back to work August 2019. She is divorced and lives alone, with no pets. Her daughter [name] lives in [place]. She is disabled secondary to schizoaffective disorder. Daughter [name] lives in [place], [place], and she is a Estate agent but mostly a homemaker. The patient has 3 grandchildren, the older 5, the other 2 being under 41 years old. The patient attends [employer]. Her mother passed away in 03-01-2017 due to breast cancer.  ADVANCED DIRECTIVES: her daughter [name] is her healthcare power of attorney. She can be reached at [place]: Social History   Tobacco Use  . Smoking status: Former Smoker    Packs/day: 1.00    Years: 12.00    Pack years: 12.00    Types: Cigarettes    Last attempt to quit: 07/23/1981    Years since quitting: 37.8  . Smokeless tobacco: Never Used  Substance Use Topics  . Alcohol use: Yes    Alcohol/week: 0.0 standard drinks    Comment: social occasion only- special   . Drug use: No     Colonoscopy: ; [place]  NWG:NFAOZHY 2016/[place]  Bone density:rJanuary 2018/normal   Lipid panel:  Allergies  Allergen Reactions  . Bee Venom   . Ciprofloxacin   . Levaquin [Levofloxacin In D5w]   . Norco [Hydrocodone-Acetaminophen] Other (See  Comments)    Pt states Norco made her extremely hyper after her surgery and she was not able to sleep.    Current Outpatient Medications  Medication Sig Dispense Refill  . anastrozole (ARIMIDEX) 1 MG tablet Take 1 tablet (1 mg total) by mouth daily. 90 tablet 4  . cholecalciferol (VITAMIN D3) 25 MCG (1000 UT) tablet Take 1 tablet (1,000 Units total) by mouth daily. 90 tablet 4  . folic acid (FOLVITE) 1 MG tablet Take 2 mg by mouth every evening.    . lidocaine-prilocaine (EMLA) cream Apply to affected area once 30 g 3  . Polyethyl Glycol-Propyl Glycol (LUBRICANT EYE DROPS) 0.4-0.3 % SOLN Place 1-2 drops into both eyes 3 (three) times daily as needed (dry eyes/irritated eyes.).    Marland Kitchen prochlorperazine (COMPAZINE) 10 MG tablet Take 1 tablet (10 mg total) by mouth every 6 (six) hours as needed (Nausea or vomiting). 30 tablet 1  . zolpidem (AMBIEN) 5 MG tablet Take 1-2 tablets (5-10 mg total) by mouth at bedtime as needed for sleep. 30 tablet 0   Current Facility-Administered Medications  Medication Dose Route Frequency Provider Last Rate Last Dose  . triamcinolone acetonide (KENALOG) 10 MG/ML injection 10 mg  10 mg Other Once Wallene Huh, DPM        OBJECTIVE: Middle-aged white woman who appears well  Vitals:   05/05/19 0925  BP: (!) 147/90  Sullivan: 71  Resp: 18  Temp: (!) 97.3 F (36.3 C)  SpO2: 98%     Body mass index is 30.17 kg/m.   ECOG FS:0 - Asymptomatic Filed Weights   05/05/19 0925  Weight: 181 lb 4.8 oz (82.2 kg)   Sclerae unicteric, pupils round and equal Wearing a mask No cervical or supraclavicular adenopathy Lungs no rales or rhonchi Heart regular rate and rhythm Abd soft, nontender, positive bowel sounds MSK no focal spinal tenderness, no upper extremity lymphedema Neuro: nonfocal, well oriented, appropriate affect Breasts: Status post bilateral mastectomies.  There is no evidence of chest wall recurrence.  Both axillae are benign.  LAB RESULTS:  CMP      Component Value Date/Time   NA 140 11/11/2018 1058   NA 141 08/05/2017 1335   K 4.2 11/11/2018 1058   K 4.5 08/05/2017 1335   CL 106 11/11/2018 1058   CO2 26 11/11/2018 1058   CO2 29 08/05/2017 1335   GLUCOSE 82 11/11/2018 1058   GLUCOSE 77 08/05/2017 1335   BUN 16 11/11/2018 1058   BUN 13.4 08/05/2017 1335   CREATININE 0.73 11/11/2018 1058   CREATININE 0.8 08/05/2017 1335   CALCIUM 9.2 11/11/2018 1058  CALCIUM 10.0 08/05/2017 1335   PROT 6.5 11/11/2018 1058   PROT 6.8 08/05/2017 1335   ALBUMIN 3.7 11/11/2018 1058   ALBUMIN 3.6 08/05/2017 1335   AST 17 11/11/2018 1058   AST 19 08/05/2017 1335   ALT 11 11/11/2018 1058   ALT 12 08/05/2017 1335   ALKPHOS 231 (H) 11/11/2018 1058   ALKPHOS 169 (H) 08/05/2017 1335   BILITOT 0.3 11/11/2018 1058   BILITOT 0.63 08/05/2017 1335   GFRNONAA >60 11/11/2018 1058   GFRAA >60 11/11/2018 1058    INo results found for: SPEP, UPEP  Lab Results  Component Value Date   WBC 3.6 (L) 11/11/2018   NEUTROABS 2.2 11/11/2018   HGB 12.9 11/11/2018   HCT 38.8 11/11/2018   MCV 95.3 11/11/2018   PLT 158 11/11/2018      Chemistry      Component Value Date/Time   NA 140 11/11/2018 1058   NA 141 08/05/2017 1335   K 4.2 11/11/2018 1058   K 4.5 08/05/2017 1335   CL 106 11/11/2018 1058   CO2 26 11/11/2018 1058   CO2 29 08/05/2017 1335   BUN 16 11/11/2018 1058   BUN 13.4 08/05/2017 1335   CREATININE 0.73 11/11/2018 1058   CREATININE 0.8 08/05/2017 1335      Component Value Date/Time   CALCIUM 9.2 11/11/2018 1058   CALCIUM 10.0 08/05/2017 1335   ALKPHOS 231 (H) 11/11/2018 1058   ALKPHOS 169 (H) 08/05/2017 1335   AST 17 11/11/2018 1058   AST 19 08/05/2017 1335   ALT 11 11/11/2018 1058   ALT 12 08/05/2017 1335   BILITOT 0.3 11/11/2018 1058   BILITOT 0.63 08/05/2017 1335       No results found for: LABCA2  No components found for: LABCA125  No results for input(s): INR in the last 168 hours.  Urinalysis    Component Value  Date/Time   BILIRUBINUR neg 12/11/2015 0925   PROTEINUR neg 12/11/2015 0925   UROBILINOGEN negative 12/11/2015 0925   NITRITE neg 12/11/2015 0925   LEUKOCYTESUR Negative 12/11/2015 0925    STUDIES: No results found.  RESEARCH: enrolled in UPBEAT study; not a candidate for BR 003 because of concurrent cancer  ASSESSMENT: 68 y.o. [place] woman status post right breast upper outer quadrant biopsy 01/02/2016 for a clinical T2 N0, stage IIA invasive ductal carcinoma, grade 1, estrogen and progesterone receptor positive, HER-2 not amplified, with an MIB-1 of 10%.  (1). Right lumpectomy and sentinel lymph node sampling 01/22/2016 showed a pT1c pN0, stage IA invasive ductal carcinoma, grade 1, with close but negative margins. Repeat HER-2 testing was again not amplified   (3) Oncotype score of 18 predicts a risk of recurrence outside the breast within the next 10 years of 11% if the patient's only systemic therapy is tamoxifen for 5 years. It also predicts no significant benefit from chemotherapy.     (4) adjuvant radiation3/14/17-4/11/17 Site/dose:   Right breast - 42.72 gy at 2.67 Gy per fraction x 16 fractions followed by an electron boost to the right breast with 10 Gy at 2 Gy per fraction x 5 fractions.    (5)  Anastrozole started 04/21/2016, discontinued after approximately 1 month with significant side effects  (a)  Bone density at [facility] September 2013 was normal  (b) bone density January 2018 was normal  (c) anastrozole restarted February 2018, held again at the start of chemotherapy February 2019  (6) genetics testing  02/24/2016 through the Breast/Ovarian cancer gene panel offered by  [facility] found no deleterious mutations in ATM, BARD1, BRCA1, BRCA2, BRIP1, CDH1, CHEK2, EPCAM, FANCC, MLH1, MSH2, MSH6, NBN, PALB2, PMS2, PTEN, RAD51C, RAD51D, TP53, and XRCC2.   (7) left breast biopsy 12/17/2017 showed a cT2 cN0, stage IIA- IIB invasive ductal carcinoma, grade 2, estrogen  receptor weakly positive, progesterone receptor negative, with HER-2 equivocal by FISH, negative by immunohistochemistry  (8) status post bilateral mastectomies and left sentinel lymph node sampling 01/04/2018 showing  (a) on the right, no evidence of disease  (b) on the left, a pT2 pN0, stage IIB invasive ductal carcinoma, grade 3, triple negative  (c) patient opted against reconstruction or prostheses  (9) adjuvant chemotherapy consisting of cyclophosphamide and doxorubicin in dose dense fashion x4 started 02/16/2018, completed 03/30/2018,  followed by weekly paclitaxel /carboplatin x12 starting 04/13/2018;  (a) Paclitaxel/Carboplatin stopped after 3 cycles due to peripheral neuropathy.  (b)  Gemcitabine Carboplatin started on 5/21, but held x 3 weeks due to prolonged neutropenia  (c) carboplatin and gemcitabine given day 1 and day 8 with OnPro support for the remaining cycles   (10) resumed anastrozole 09/21/2018   (a) DEXA scan in August 31, 2012 was normal   (b) repeat DEXA scan  PLAN: Samantha Sullivan is now a little over a year out from definitive surgery for her breast cancer, with no evidence of disease recurrence.  This is favorable.  She is tolerating anastrozole well and the plan will be to continue that a total of 5 years.  I am putting her in for a DEXA scan sometime in November of this year.  If there is significant bone loss we will consider Prolia.  She has an excellent exercise program and a good diet.  We talked about sleep hygiene issues today  She is keeping appropriate COVID precautions  She has decided to keep her port and we are flushing out every 6 weeks.  She knows to call for any other issues that may develop before her next visit here.  Angala Hilgers, Virgie Dad, MD  05/05/19 9:49 AM Medical Oncology and Hematology [facility] [address] [place], [place] [address] Tel. [phone]    Fax. [phone]   I, Wilburn Mylar, am acting as scribe  for Dr. Virgie Dad. Miyah Hampshire.  I, Lurline Del MD, have reviewed the above documentation for accuracy and completeness, and I agree with the above.

## 2019-05-04 NOTE — Telephone Encounter (Signed)
Called regarding upcoming [facility] appointment, patient's voicemail is full and due to no communication this will be a telephone visit.

## 2019-05-05 ENCOUNTER — Other Ambulatory Visit: Payer: Self-pay

## 2019-05-05 ENCOUNTER — Inpatient Hospital Stay: Payer: PPO | Attending: Oncology | Admitting: Oncology

## 2019-05-05 ENCOUNTER — Inpatient Hospital Stay: Payer: PPO | Admitting: *Deleted

## 2019-05-05 ENCOUNTER — Other Ambulatory Visit: Payer: PPO

## 2019-05-05 VITALS — BP 147/90 | HR 71 | Temp 97.3°F | Resp 18 | Ht 65.0 in | Wt 181.3 lb

## 2019-05-05 DIAGNOSIS — C50912 Malignant neoplasm of unspecified site of left female breast: Secondary | ICD-10-CM

## 2019-05-05 DIAGNOSIS — Z79811 Long term (current) use of aromatase inhibitors: Secondary | ICD-10-CM

## 2019-05-05 DIAGNOSIS — C50411 Malignant neoplasm of upper-outer quadrant of right female breast: Secondary | ICD-10-CM

## 2019-05-05 DIAGNOSIS — L4050 Arthropathic psoriasis, unspecified: Secondary | ICD-10-CM

## 2019-05-05 DIAGNOSIS — C50812 Malignant neoplasm of overlapping sites of left female breast: Secondary | ICD-10-CM

## 2019-05-05 DIAGNOSIS — Z006 Encounter for examination for normal comparison and control in clinical research program: Secondary | ICD-10-CM | POA: Diagnosis not present

## 2019-05-05 DIAGNOSIS — Z9013 Acquired absence of bilateral breasts and nipples: Secondary | ICD-10-CM

## 2019-05-05 DIAGNOSIS — Z170 Estrogen receptor positive status [ER+]: Secondary | ICD-10-CM

## 2019-05-05 DIAGNOSIS — D329 Benign neoplasm of meninges, unspecified: Secondary | ICD-10-CM

## 2019-05-05 DIAGNOSIS — Z9221 Personal history of antineoplastic chemotherapy: Secondary | ICD-10-CM

## 2019-05-05 LAB — CBC WITH DIFFERENTIAL (CANCER CENTER ONLY)
Abs Immature Granulocytes: 0.02 10*3/uL (ref 0.00–0.07)
Basophils Absolute: 0 10*3/uL (ref 0.0–0.1)
Basophils Relative: 0 %
Eosinophils Absolute: 0 10*3/uL (ref 0.0–0.5)
Eosinophils Relative: 1 %
HCT: 39.6 % (ref 36.0–46.0)
Hemoglobin: 13.6 g/dL (ref 12.0–15.0)
Immature Granulocytes: 1 %
Lymphocytes Relative: 29 %
Lymphs Abs: 1.3 10*3/uL (ref 0.7–4.0)
MCH: 31.8 pg (ref 26.0–34.0)
MCHC: 34.3 g/dL (ref 30.0–36.0)
MCV: 92.5 fL (ref 80.0–100.0)
Monocytes Absolute: 0.4 10*3/uL (ref 0.1–1.0)
Monocytes Relative: 10 %
Neutro Abs: 2.6 10*3/uL (ref 1.7–7.7)
Neutrophils Relative %: 59 %
Platelet Count: 216 10*3/uL (ref 150–400)
RBC: 4.28 MIL/uL (ref 3.87–5.11)
RDW: 12.5 % (ref 11.5–15.5)
WBC Count: 4.4 10*3/uL (ref 4.0–10.5)
nRBC: 0 % (ref 0.0–0.2)

## 2019-05-05 LAB — CMP (CANCER CENTER ONLY)
ALT: 14 U/L (ref 0–44)
AST: 18 U/L (ref 15–41)
Albumin: 3.9 g/dL (ref 3.5–5.0)
Alkaline Phosphatase: 153 U/L — ABNORMAL HIGH (ref 38–126)
Anion gap: 8 (ref 5–15)
BUN: 13 mg/dL (ref 8–23)
CO2: 27 mmol/L (ref 22–32)
Calcium: 9.4 mg/dL (ref 8.9–10.3)
Chloride: 106 mmol/L (ref 98–111)
Creatinine: 0.78 mg/dL (ref 0.44–1.00)
GFR, Est AFR Am: >60 mL/min (ref >60)
GFR, Estimated: >60 mL/min (ref >60)
Glucose, Bld: 92 mg/dL (ref 70–99)
Potassium: 4 mmol/L (ref 3.5–5.1)
Sodium: 141 mmol/L (ref 135–145)
Total Bilirubin: 0.6 mg/dL (ref 0.3–1.2)
Total Protein: 6.9 g/dL (ref 6.5–8.1)

## 2019-05-05 MED ORDER — ANASTROZOLE 1 MG PO TABS: 1.0000 mg | ORAL_TABLET | Freq: Every day | ORAL | 4 refills | Status: DC

## 2019-05-05 MED ORDER — LIDOCAINE-PRILOCAINE 2.5-2.5 % EX CREA: TOPICAL_CREAM | CUTANEOUS | 3 refills | Status: DC

## 2019-05-23 ENCOUNTER — Other Ambulatory Visit: Payer: Self-pay | Admitting: *Deleted

## 2019-05-23 DIAGNOSIS — C50411 Malignant neoplasm of upper-outer quadrant of right female breast: Secondary | ICD-10-CM

## 2019-05-24 ENCOUNTER — Telehealth: Payer: Self-pay | Admitting: Oncology

## 2019-05-24 NOTE — Telephone Encounter (Signed)
Added lab per sch msg. Called and left msg

## 2019-06-15 ENCOUNTER — Telehealth: Payer: Self-pay | Admitting: *Deleted

## 2019-06-15 NOTE — Telephone Encounter (Signed)
Called patient and left voice message reminding her of fasting labs for appointment on  6/25 . [facility] 06/15/19

## 2019-06-16 ENCOUNTER — Inpatient Hospital Stay: Payer: PPO

## 2019-06-16 ENCOUNTER — Inpatient Hospital Stay: Payer: PPO | Attending: Oncology

## 2019-06-16 ENCOUNTER — Encounter: Payer: Self-pay | Admitting: *Deleted

## 2019-06-16 ENCOUNTER — Other Ambulatory Visit: Payer: Self-pay

## 2019-06-16 DIAGNOSIS — C50912 Malignant neoplasm of unspecified site of left female breast: Secondary | ICD-10-CM | POA: Insufficient documentation

## 2019-06-16 DIAGNOSIS — C50411 Malignant neoplasm of upper-outer quadrant of right female breast: Secondary | ICD-10-CM | POA: Diagnosis not present

## 2019-06-16 DIAGNOSIS — Z170 Estrogen receptor positive status [ER+]: Secondary | ICD-10-CM

## 2019-06-16 DIAGNOSIS — Z95828 Presence of other vascular implants and grafts: Secondary | ICD-10-CM

## 2019-06-16 MED ORDER — SODIUM CHLORIDE 0.9% FLUSH
10.0000 mL | INTRAVENOUS | Status: DC | PRN
  Administered 2019-06-16: 10 mL
  Filled 2019-06-16: qty 10

## 2019-06-16 MED ORDER — HEPARIN SOD (PORK) LOCK FLUSH 100 UNIT/ML IV SOLN
500.0000 [IU] | Freq: Once | INTRAVENOUS | Status: AC | PRN
  Administered 2019-06-16: 500 [IU]
  Filled 2019-06-16: qty 5

## 2019-06-17 LAB — RESEARCH LABS

## 2019-06-30 NOTE — Progress Notes (Addendum)
[place]  [id number] Patient had 12 month labs drawn per protocol,patient was given a [employer] gift card and tote bag.Patient was thanked for her time and participation in the research study. Alysia Penna

## 2019-07-07 DIAGNOSIS — H722X1 Other marginal perforations of tympanic membrane, right ear: Secondary | ICD-10-CM | POA: Diagnosis not present

## 2019-07-07 DIAGNOSIS — H903 Sensorineural hearing loss, bilateral: Secondary | ICD-10-CM | POA: Diagnosis not present

## 2019-07-18 ENCOUNTER — Other Ambulatory Visit: Payer: Self-pay | Admitting: *Deleted

## 2019-07-18 DIAGNOSIS — C50411 Malignant neoplasm of upper-outer quadrant of right female breast: Secondary | ICD-10-CM

## 2019-07-18 DIAGNOSIS — Z95828 Presence of other vascular implants and grafts: Secondary | ICD-10-CM

## 2019-07-18 DIAGNOSIS — D329 Benign neoplasm of meninges, unspecified: Secondary | ICD-10-CM

## 2019-07-19 ENCOUNTER — Inpatient Hospital Stay: Payer: PPO | Attending: Oncology

## 2019-07-19 ENCOUNTER — Inpatient Hospital Stay: Payer: PPO

## 2019-07-19 ENCOUNTER — Other Ambulatory Visit: Payer: Self-pay

## 2019-07-19 DIAGNOSIS — C50411 Malignant neoplasm of upper-outer quadrant of right female breast: Secondary | ICD-10-CM | POA: Diagnosis not present

## 2019-07-19 DIAGNOSIS — Z95828 Presence of other vascular implants and grafts: Secondary | ICD-10-CM

## 2019-07-19 DIAGNOSIS — Z170 Estrogen receptor positive status [ER+]: Secondary | ICD-10-CM

## 2019-07-19 DIAGNOSIS — D329 Benign neoplasm of meninges, unspecified: Secondary | ICD-10-CM

## 2019-07-19 LAB — CMP (CANCER CENTER ONLY)
ALT: 11 U/L (ref 0–44)
AST: 16 U/L (ref 15–41)
Albumin: 3.8 g/dL (ref 3.5–5.0)
Alkaline Phosphatase: 138 U/L — ABNORMAL HIGH (ref 38–126)
Anion gap: 7 (ref 5–15)
BUN: 14 mg/dL (ref 8–23)
CO2: 27 mmol/L (ref 22–32)
Calcium: 9.2 mg/dL (ref 8.9–10.3)
Chloride: 107 mmol/L (ref 98–111)
Creatinine: 0.75 mg/dL (ref 0.44–1.00)
GFR, Est AFR Am: >60 mL/min (ref >60)
GFR, Estimated: >60 mL/min (ref >60)
Glucose, Bld: 91 mg/dL (ref 70–99)
Potassium: 4.1 mmol/L (ref 3.5–5.1)
Sodium: 141 mmol/L (ref 135–145)
Total Bilirubin: 0.5 mg/dL (ref 0.3–1.2)
Total Protein: 6.6 g/dL (ref 6.5–8.1)

## 2019-07-19 MED ORDER — HEPARIN SOD (PORK) LOCK FLUSH 100 UNIT/ML IV SOLN
500.0000 [IU] | Freq: Once | INTRAVENOUS | Status: AC | PRN
  Administered 2019-07-19: 500 [IU]
  Filled 2019-07-19: qty 5

## 2019-07-19 MED ORDER — SODIUM CHLORIDE 0.9% FLUSH
10.0000 mL | INTRAVENOUS | Status: DC | PRN
  Administered 2019-07-19: 10 mL
  Filled 2019-07-19: qty 10

## 2019-07-19 NOTE — Progress Notes (Signed)
Labs to be faxed to [facility] Surgeon Dr. Sherwood Gambler [phone] for MRI tomorrow morning.

## 2019-07-20 DIAGNOSIS — D320 Benign neoplasm of cerebral meninges: Secondary | ICD-10-CM | POA: Diagnosis not present

## 2019-07-20 DIAGNOSIS — D329 Benign neoplasm of meninges, unspecified: Secondary | ICD-10-CM | POA: Diagnosis not present

## 2019-07-28 ENCOUNTER — Other Ambulatory Visit: Payer: PPO

## 2019-07-29 DIAGNOSIS — D329 Benign neoplasm of meninges, unspecified: Secondary | ICD-10-CM | POA: Diagnosis not present

## 2019-07-29 DIAGNOSIS — R030 Elevated blood-pressure reading, without diagnosis of hypertension: Secondary | ICD-10-CM | POA: Diagnosis not present

## 2019-07-29 DIAGNOSIS — Z6829 Body mass index (BMI) 29.0-29.9, adult: Secondary | ICD-10-CM | POA: Diagnosis not present

## 2019-09-05 DIAGNOSIS — M65849 Other synovitis and tenosynovitis, unspecified hand: Secondary | ICD-10-CM | POA: Diagnosis not present

## 2019-09-05 DIAGNOSIS — M79644 Pain in right finger(s): Secondary | ICD-10-CM | POA: Diagnosis not present

## 2019-09-05 DIAGNOSIS — M65331 Trigger finger, right middle finger: Secondary | ICD-10-CM | POA: Diagnosis not present

## 2019-09-05 DIAGNOSIS — M79645 Pain in left finger(s): Secondary | ICD-10-CM | POA: Diagnosis not present

## 2019-09-08 ENCOUNTER — Other Ambulatory Visit: Payer: Self-pay

## 2019-09-08 ENCOUNTER — Inpatient Hospital Stay: Payer: PPO | Attending: Oncology

## 2019-09-08 DIAGNOSIS — Z452 Encounter for adjustment and management of vascular access device: Secondary | ICD-10-CM | POA: Diagnosis not present

## 2019-09-08 DIAGNOSIS — M65331 Trigger finger, right middle finger: Secondary | ICD-10-CM | POA: Diagnosis not present

## 2019-09-08 DIAGNOSIS — C50411 Malignant neoplasm of upper-outer quadrant of right female breast: Secondary | ICD-10-CM | POA: Diagnosis not present

## 2019-09-08 DIAGNOSIS — M65332 Trigger finger, left middle finger: Secondary | ICD-10-CM | POA: Diagnosis not present

## 2019-09-08 DIAGNOSIS — Z95828 Presence of other vascular implants and grafts: Secondary | ICD-10-CM

## 2019-09-08 MED ORDER — HEPARIN SOD (PORK) LOCK FLUSH 100 UNIT/ML IV SOLN
500.0000 [IU] | Freq: Once | INTRAVENOUS | Status: AC | PRN
  Administered 2019-09-08: 500 [IU]
  Filled 2019-09-08: qty 5

## 2019-09-08 MED ORDER — SODIUM CHLORIDE 0.9% FLUSH
10.0000 mL | INTRAVENOUS | Status: DC | PRN
  Administered 2019-09-08: 10 mL
  Filled 2019-09-08: qty 10

## 2019-09-15 DIAGNOSIS — H2513 Age-related nuclear cataract, bilateral: Secondary | ICD-10-CM | POA: Diagnosis not present

## 2019-09-15 DIAGNOSIS — H5213 Myopia, bilateral: Secondary | ICD-10-CM | POA: Diagnosis not present

## 2019-09-15 DIAGNOSIS — R29898 Other symptoms and signs involving the musculoskeletal system: Secondary | ICD-10-CM | POA: Diagnosis not present

## 2019-09-15 DIAGNOSIS — H52223 Regular astigmatism, bilateral: Secondary | ICD-10-CM | POA: Diagnosis not present

## 2019-09-15 DIAGNOSIS — D3131 Benign neoplasm of right choroid: Secondary | ICD-10-CM | POA: Diagnosis not present

## 2019-09-29 DIAGNOSIS — R29898 Other symptoms and signs involving the musculoskeletal system: Secondary | ICD-10-CM | POA: Diagnosis not present

## 2019-10-12 DIAGNOSIS — R29898 Other symptoms and signs involving the musculoskeletal system: Secondary | ICD-10-CM | POA: Diagnosis not present

## 2019-10-17 ENCOUNTER — Telehealth: Payer: Self-pay | Admitting: Oncology

## 2019-10-17 NOTE — Telephone Encounter (Signed)
Returned patient's phone call regarding rescheduling an appointment, per patient's request 10/29 has been moved to 10/27.

## 2019-10-18 ENCOUNTER — Ambulatory Visit: Payer: PPO

## 2019-10-18 ENCOUNTER — Other Ambulatory Visit: Payer: Self-pay

## 2019-10-18 ENCOUNTER — Inpatient Hospital Stay: Payer: PPO | Attending: Oncology

## 2019-10-18 DIAGNOSIS — C50411 Malignant neoplasm of upper-outer quadrant of right female breast: Secondary | ICD-10-CM | POA: Insufficient documentation

## 2019-10-18 DIAGNOSIS — Z452 Encounter for adjustment and management of vascular access device: Secondary | ICD-10-CM | POA: Diagnosis not present

## 2019-10-18 DIAGNOSIS — Z95828 Presence of other vascular implants and grafts: Secondary | ICD-10-CM

## 2019-10-18 MED ORDER — HEPARIN SOD (PORK) LOCK FLUSH 100 UNIT/ML IV SOLN
500.0000 [IU] | Freq: Once | INTRAVENOUS | Status: AC | PRN
  Administered 2019-10-18: 500 [IU]
  Filled 2019-10-18: qty 5

## 2019-10-18 MED ORDER — SODIUM CHLORIDE 0.9% FLUSH
10.0000 mL | INTRAVENOUS | Status: DC | PRN
  Administered 2019-10-18: 10 mL
  Filled 2019-10-18: qty 10

## 2019-10-18 NOTE — Patient Instructions (Signed)

## 2019-10-20 ENCOUNTER — Other Ambulatory Visit: Payer: PPO

## 2019-10-24 DIAGNOSIS — R29898 Other symptoms and signs involving the musculoskeletal system: Secondary | ICD-10-CM | POA: Diagnosis not present

## 2019-11-30 ENCOUNTER — Other Ambulatory Visit: Payer: Self-pay

## 2019-11-30 DIAGNOSIS — Z170 Estrogen receptor positive status [ER+]: Secondary | ICD-10-CM

## 2019-11-30 DIAGNOSIS — C50411 Malignant neoplasm of upper-outer quadrant of right female breast: Secondary | ICD-10-CM

## 2019-12-01 ENCOUNTER — Inpatient Hospital Stay: Payer: PPO | Attending: Oncology

## 2019-12-01 ENCOUNTER — Other Ambulatory Visit: Payer: Self-pay

## 2019-12-01 ENCOUNTER — Inpatient Hospital Stay: Payer: PPO

## 2019-12-01 DIAGNOSIS — Z170 Estrogen receptor positive status [ER+]: Secondary | ICD-10-CM

## 2019-12-01 DIAGNOSIS — C50411 Malignant neoplasm of upper-outer quadrant of right female breast: Secondary | ICD-10-CM | POA: Insufficient documentation

## 2019-12-01 DIAGNOSIS — Z95828 Presence of other vascular implants and grafts: Secondary | ICD-10-CM

## 2019-12-01 LAB — CMP (CANCER CENTER ONLY)
ALT: 13 U/L (ref 0–44)
AST: 18 U/L (ref 15–41)
Albumin: 3.8 g/dL (ref 3.5–5.0)
Alkaline Phosphatase: 154 U/L — ABNORMAL HIGH (ref 38–126)
Anion gap: 7 (ref 5–15)
BUN: 16 mg/dL (ref 8–23)
CO2: 28 mmol/L (ref 22–32)
Calcium: 9.1 mg/dL (ref 8.9–10.3)
Chloride: 105 mmol/L (ref 98–111)
Creatinine: 0.77 mg/dL (ref 0.44–1.00)
GFR, Est AFR Am: >60 mL/min (ref >60)
GFR, Estimated: >60 mL/min (ref >60)
Glucose, Bld: 102 mg/dL — ABNORMAL HIGH (ref 70–99)
Potassium: 4.2 mmol/L (ref 3.5–5.1)
Sodium: 140 mmol/L (ref 135–145)
Total Bilirubin: 0.4 mg/dL (ref 0.3–1.2)
Total Protein: 6.8 g/dL (ref 6.5–8.1)

## 2019-12-01 LAB — CBC WITH DIFFERENTIAL (CANCER CENTER ONLY)
Abs Immature Granulocytes: 0.01 10*3/uL (ref 0.00–0.07)
Basophils Absolute: 0 10*3/uL (ref 0.0–0.1)
Basophils Relative: 0 %
Eosinophils Absolute: 0.1 10*3/uL (ref 0.0–0.5)
Eosinophils Relative: 1 %
HCT: 40.1 % (ref 36.0–46.0)
Hemoglobin: 14 g/dL (ref 12.0–15.0)
Immature Granulocytes: 0 %
Lymphocytes Relative: 30 %
Lymphs Abs: 1.5 10*3/uL (ref 0.7–4.0)
MCH: 31.5 pg (ref 26.0–34.0)
MCHC: 34.9 g/dL (ref 30.0–36.0)
MCV: 90.1 fL (ref 80.0–100.0)
Monocytes Absolute: 0.5 10*3/uL (ref 0.1–1.0)
Monocytes Relative: 10 %
Neutro Abs: 2.9 10*3/uL (ref 1.7–7.7)
Neutrophils Relative %: 59 %
Platelet Count: 204 10*3/uL (ref 150–400)
RBC: 4.45 MIL/uL (ref 3.87–5.11)
RDW: 12.5 % (ref 11.5–15.5)
WBC Count: 5 10*3/uL (ref 4.0–10.5)
nRBC: 0 % (ref 0.0–0.2)

## 2019-12-01 MED ORDER — SODIUM CHLORIDE 0.9% FLUSH
10.0000 mL | INTRAVENOUS | Status: DC | PRN
  Administered 2019-12-01: 10 mL
  Filled 2019-12-01: qty 10

## 2019-12-01 MED ORDER — HEPARIN SOD (PORK) LOCK FLUSH 100 UNIT/ML IV SOLN
500.0000 [IU] | Freq: Once | INTRAVENOUS | Status: AC | PRN
  Administered 2019-12-01: 500 [IU]
  Filled 2019-12-01: qty 5

## 2019-12-01 NOTE — Patient Instructions (Signed)

## 2019-12-09 ENCOUNTER — Other Ambulatory Visit: Payer: Self-pay | Admitting: Oncology

## 2020-01-02 ENCOUNTER — Other Ambulatory Visit: Payer: Self-pay | Admitting: *Deleted

## 2020-01-02 ENCOUNTER — Telehealth: Payer: Self-pay | Admitting: *Deleted

## 2020-01-02 ENCOUNTER — Other Ambulatory Visit (HOSPITAL_COMMUNITY): Payer: Self-pay | Admitting: Oncology

## 2020-01-02 DIAGNOSIS — Z170 Estrogen receptor positive status [ER+]: Secondary | ICD-10-CM

## 2020-01-02 DIAGNOSIS — C801 Malignant (primary) neoplasm, unspecified: Secondary | ICD-10-CM

## 2020-01-02 DIAGNOSIS — C50411 Malignant neoplasm of upper-outer quadrant of right female breast: Secondary | ICD-10-CM

## 2020-01-02 NOTE — Telephone Encounter (Signed)
[id number]   01/02/20  Scheduled UPBEAT Month 24 visit with [name].  Patient will complete fasting labs at time of routine flush appointment on 01/12/20.  Remainder of visit to be conducted on 1/28.21.  Cardiac MRI at 10:00am; remaining procedures to follow at the [facility].  Doreatha Martin, RN, BSN, [facility] 01/02/2020 4:54 PM

## 2020-01-12 ENCOUNTER — Inpatient Hospital Stay: Payer: PPO | Attending: Oncology

## 2020-01-12 ENCOUNTER — Other Ambulatory Visit: Payer: Self-pay

## 2020-01-12 ENCOUNTER — Other Ambulatory Visit: Payer: PPO

## 2020-01-12 DIAGNOSIS — Z95828 Presence of other vascular implants and grafts: Secondary | ICD-10-CM

## 2020-01-12 DIAGNOSIS — C50411 Malignant neoplasm of upper-outer quadrant of right female breast: Secondary | ICD-10-CM | POA: Diagnosis not present

## 2020-01-12 MED ORDER — SODIUM CHLORIDE 0.9% FLUSH
10.0000 mL | INTRAVENOUS | Status: DC | PRN
  Filled 2020-01-12: qty 10

## 2020-01-12 MED ORDER — HEPARIN SOD (PORK) LOCK FLUSH 100 UNIT/ML IV SOLN
500.0000 [IU] | Freq: Once | INTRAVENOUS | Status: DC | PRN
  Filled 2020-01-12: qty 5

## 2020-01-19 ENCOUNTER — Other Ambulatory Visit (HOSPITAL_COMMUNITY): Payer: PPO

## 2020-01-19 ENCOUNTER — Encounter: Payer: PPO | Admitting: *Deleted

## 2020-02-22 ENCOUNTER — Telehealth: Payer: Self-pay | Admitting: *Deleted

## 2020-02-22 NOTE — Telephone Encounter (Signed)
Called patient and LVM to remind her of lab appointment 3/4 at 9:00 and to fast 3 hours prior to labs.

## 2020-02-23 ENCOUNTER — Inpatient Hospital Stay: Payer: PPO

## 2020-02-23 ENCOUNTER — Other Ambulatory Visit: Payer: Self-pay

## 2020-02-23 ENCOUNTER — Encounter: Payer: Self-pay | Admitting: *Deleted

## 2020-02-23 ENCOUNTER — Inpatient Hospital Stay: Payer: PPO | Attending: Oncology

## 2020-02-23 DIAGNOSIS — C50912 Malignant neoplasm of unspecified site of left female breast: Secondary | ICD-10-CM

## 2020-02-23 DIAGNOSIS — Z95828 Presence of other vascular implants and grafts: Secondary | ICD-10-CM

## 2020-02-23 DIAGNOSIS — C50411 Malignant neoplasm of upper-outer quadrant of right female breast: Secondary | ICD-10-CM

## 2020-02-23 DIAGNOSIS — Z170 Estrogen receptor positive status [ER+]: Secondary | ICD-10-CM

## 2020-02-23 LAB — RESEARCH LABS

## 2020-02-23 MED ORDER — SODIUM CHLORIDE 0.9% FLUSH
10.0000 mL | INTRAVENOUS | Status: DC | PRN
  Administered 2020-02-23: 10 mL
  Filled 2020-02-23: qty 10

## 2020-02-23 MED ORDER — HEPARIN SOD (PORK) LOCK FLUSH 100 UNIT/ML IV SOLN
500.0000 [IU] | Freq: Once | INTRAVENOUS | Status: AC | PRN
  Administered 2020-02-23: 500 [IU]
  Filled 2020-02-23: qty 5

## 2020-02-23 NOTE — Research (Signed)
[facility] [id number] - Month 24  02/23/20 08:55am    Reconsent - This RN met with patient Samantha Sullivan to review updated informed consent form.  Patient was unaccompanied during her visit today.  Discussed all changes that made been made in the consent form with patient.  Patient verbalizes understanding of changes.  Patient verbalizes willingness to continue participation in [place].  Approximately 10 minutes with the patient discussing the changes.  Patient continues to decline participation for optional DNA samples.  Patient voluntarily signed the informed consent form, protocol version date 08/22/2019, [place] active date 10/13/2019.  Patient was provided a copy of the signed informed consent form.  Patient was thanked for her ongoing participation in the UPBEAT study.  Labs - patient confirms that she has been fasting for her research labs today.  Doreatha Martin, RN, BSN, [facility] 02/23/2020 10:49 AM

## 2020-03-07 ENCOUNTER — Telehealth: Payer: Self-pay | Admitting: *Deleted

## 2020-03-07 NOTE — Telephone Encounter (Signed)
Upbeat; Called patient to confirm her appointments tomorrow for MRI and research visit. Instructed patient to check in for MRI at main Radiology in [place] to [facility] after MRI and check in for research appointment. She verbalized understanding.  Foye Spurling, BSN, RN Clinical Research Nurse 03/07/2020 2:28 PM

## 2020-03-08 ENCOUNTER — Other Ambulatory Visit: Payer: Self-pay

## 2020-03-08 ENCOUNTER — Inpatient Hospital Stay: Payer: PPO | Admitting: *Deleted

## 2020-03-08 ENCOUNTER — Encounter: Payer: PPO | Admitting: *Deleted

## 2020-03-08 ENCOUNTER — Ambulatory Visit (HOSPITAL_COMMUNITY)
Admission: RE | Admit: 2020-03-08 | Discharge: 2020-03-08 | Disposition: A | Discharge status: Home / Self Care | Payer: PPO | Source: Ambulatory Visit | Attending: Oncology | Admitting: Oncology

## 2020-03-08 ENCOUNTER — Other Ambulatory Visit: Payer: Self-pay | Admitting: *Deleted

## 2020-03-08 VITALS — BP 126/82 | HR 82 | Ht 65.5 in | Wt 184.3 lb

## 2020-03-08 DIAGNOSIS — C801 Malignant (primary) neoplasm, unspecified: Secondary | ICD-10-CM | POA: Insufficient documentation

## 2020-03-08 DIAGNOSIS — C50912 Malignant neoplasm of unspecified site of left female breast: Secondary | ICD-10-CM

## 2020-03-08 NOTE — Research (Signed)
[id number] Upbeat 24 Months Visit: Patient into clinic by herself this morning to complete activities for Upbeat study Cardiac MRI; Completed per protocol without difficulty.  PROs; Patient completed on her own in clinic. Research nurse collected and checked for completeness and accuracy.  Labs; Collected on a previous appointment per protocol. Informed patient that her results were all within normal range.  VS; Collected per protocol after resting for 5 minutes. Height and weight without shoes. See VS flowsheet Neurocognitive testing; Completed byAnna Hurd, researchspecialist.  Medication review; Reviewed current medication list with patient and list updated. Patient reports she stopped taking Anastrozole at the beginning of this year because she does not like taking medications. She says she understands the reasons why Dr. Jana Hakim prescribed it and that it is recommended. She says she has taken it for 3 years and does not wish to continue. She plans to notify Dr. Jana Hakim on her next visit in June.  CV Events review; Patient denies any hospitalizations or any new medical diagnosis since last visit, including any cardiovascular events.  Waist measurement; 40inches, measured per instructions on CRF14, page 1.  Physical Functions testing; Completed per protocol without difficulty and patient tolerated well.  Gift Card; [phone] [employer] gift card given to patient for completing study activities for this time point. Plan; Thanked patient for her participation in this study. Informed patient she will have yearly follow up phone calls for the next 7 years. Patient verbalized understanding.  Foye Spurling, BSN, RN Clinical Research Nurse 03/08/2020 11:35 AM

## 2020-03-09 ENCOUNTER — Other Ambulatory Visit: Payer: Self-pay

## 2020-04-05 ENCOUNTER — Other Ambulatory Visit: Payer: PPO

## 2020-04-12 ENCOUNTER — Inpatient Hospital Stay: Payer: PPO | Attending: Oncology

## 2020-04-12 ENCOUNTER — Other Ambulatory Visit: Payer: Self-pay

## 2020-04-12 VITALS — BP 152/78 | HR 71 | Temp 98.0°F | Resp 18

## 2020-04-12 DIAGNOSIS — C50411 Malignant neoplasm of upper-outer quadrant of right female breast: Secondary | ICD-10-CM | POA: Diagnosis not present

## 2020-04-12 DIAGNOSIS — Z452 Encounter for adjustment and management of vascular access device: Secondary | ICD-10-CM | POA: Diagnosis not present

## 2020-04-12 DIAGNOSIS — Z95828 Presence of other vascular implants and grafts: Secondary | ICD-10-CM

## 2020-04-12 MED ORDER — SODIUM CHLORIDE 0.9% FLUSH
10.0000 mL | INTRAVENOUS | Status: DC | PRN
  Administered 2020-04-12: 10 mL
  Filled 2020-04-12: qty 10

## 2020-04-12 MED ORDER — HEPARIN SOD (PORK) LOCK FLUSH 100 UNIT/ML IV SOLN
500.0000 [IU] | Freq: Once | INTRAVENOUS | Status: AC | PRN
  Administered 2020-04-12: 500 [IU]
  Filled 2020-04-12: qty 5

## 2020-04-12 NOTE — Patient Instructions (Signed)

## 2020-05-17 ENCOUNTER — Ambulatory Visit: Payer: PPO | Admitting: Oncology

## 2020-05-17 ENCOUNTER — Other Ambulatory Visit: Payer: PPO

## 2020-05-30 ENCOUNTER — Other Ambulatory Visit: Payer: Self-pay | Admitting: *Deleted

## 2020-05-30 DIAGNOSIS — C50411 Malignant neoplasm of upper-outer quadrant of right female breast: Secondary | ICD-10-CM

## 2020-05-30 NOTE — Progress Notes (Signed)
[place]  [phone] [phone]     ID: Samantha Sullivan DOB: 1951/05/09  MR#: [id number]  QAS#:[id number]  Patient Care Team: Shon Baton, MD as PCP - General (Internal Medicine) Brandonlee Navis, Virgie Dad, MD as Consulting Physician (Oncology) Kem Boroughs, [place] as Nurse Practitioner (Family Medicine) Regal, Tamala Fothergill, DPM as Consulting Physician (Podiatry) Excell Seltzer, MD (Inactive) as Consulting Physician (General Surgery) Force, Shari Prows, DO as Referring Physician (Student) OTHER MD:   CHIEF COMPLAINT: Left breast cancer, triple negative; right breast cancer estrogen receptor weakly positive (s/p bilateral mastectomies)  CURRENT TREATMENT: anastrozole   INTERVAL HISTORY: Samantha Sullivan returns today for follow-up of her triple negative left breast cancer.  She went off anastrozole January 2021.  She was having some minor arthralgias which she really was being no attention to but when she went off the medication they went away and she felt better.   Since her last visit, she has not undergone any additional studies.  Recall she is status post bilateral mastectomies.     REVIEW OF SYSTEMS: Samantha Sullivan went on a no carb diet and she has lost 20 pounds since her last visit here in March.  She just returned from a visit out [place] in [place] and [place] and she greatly enjoyed it.  She was walking most days.  She is still trying to exercise regularly.  A detailed review of systems today was otherwise noncontributory     RIGHT BREAST CANCER HISTORY:  From the original intake note:  "Samantha Sullivan" noted a lump in her right breast sometime in late October or early November, but there were "so many things going on in her life" she did not bring it to medical attention until she saw [name] 12/11/2015. [name] was able to palpate a mass at the 9:30 o'clock position in the right breast, which was mobile and nontender. The left breast is status post prior remote biopsy  and there was some scar tissue at the 7:00 position. The patient was set up for bilateral diagnostic Mammography with tomosynthesis at [facility] 12/27/2015. This found the breast density to be category C. There was a new oval mass in the right breast upper outer quadrant. Ultrasound the same day confirmed a 2.2 cm mass with irregular margins in the right breast at the 9:00 position read as highly suggestive of malignancy.  Biopsy of the right breast mass in question 01/02/2016 found ([id number]) an invasive ductal carcinoma, grade 1, estrogen receptor 100% positive, progesterone receptor 90% positive, both with strong staining intensity, with an MIB-1 of 10%, and no HER-2 amplification, the signals ratio being 1.32 and the number per cell 2.05. The patient was informed and instructed to stop hormone replacement therapy (last dose 01/04/2016).  Her subsequent history is as detailed below.    PAST MEDICAL HISTORY: Past Medical History:  Diagnosis Date   Abnormal CT of the chest 12/06   numeroous lung nodules - most have resolved.   Arthritis    psoriatic arthritis- toes & feet, but resolved at this point- no need meds at this point     Cancer [facility])    right breast cancer   Complication of anesthesia    /w colonoscopy- woke up before procedure was complete   Family history of adverse reaction to anesthesia    pt's mother reported difficulty being given " enough " medicine to outcome a successful anesth. experience   Family history of leukemia    Family history of ovarian cancer  History of meningioma 2007   in 2013 stable on MRI and recheck in 5 years, no surgery necessry    HSV infection 1970's- 1980's   Psoriatic arthritis ([place])     PAST SURGICAL HISTORY: Past Surgical History:  Procedure Laterality Date   BILATERAL TOTAL MASTECTOMY WITH AXILLARY LYMPH NODE DISSECTION Bilateral 01/04/2018   Procedure: BILATERAL TOTAL MASTECTOMY PROPHYLATIC RIGHT  WITH LEFT SENTINEAL  LYMPH  NODE;  Surgeon: Excell Seltzer, MD;  Location: [place];  Service: General;  Laterality: Bilateral;   BREAST LUMPECTOMY WITH RADIOACTIVE SEED AND SENTINEL LYMPH NODE BIOPSY Right 01/22/2016   Procedure: RIGHT BREAST LUMPECTOMY WITH RADIOACTIVE SEED AND SENTINEL LYMPH NODE BIOPSY;  Surgeon: Excell Seltzer, MD;  Location: [place];  Service: General;  Laterality: Right;   BREAST SURGERY Left 1971, 2016   lumpectomy benign, 2016- Br. Ca- R lumpectomy & radiation    COLONOSCOPY  8/06   normal and recheck in 10 years   HAMMER TOE SURGERY Left 2016   MASTECTOMY Right 01/04/2018   PROPHYLATIC    MASTECTOMY COMPLETE / SIMPLE W/ SENTINEL NODE BIOPSY Left 01/04/2018   PORTACATH PLACEMENT Right 01/04/2018   Procedure: INSERTION PORT-A-CATH;  Surgeon: Excell Seltzer, MD;  Location: [place];  Service: General;  Laterality: Right;   THYROIDECTOMY, PARTIAL Left 1983    FAMILY HISTORY Family History  Problem Relation Age of Onset   Osteoporosis Mother    Osteoporosis Sister    Lung cancer Maternal Aunt        smoker   Heart Problems Maternal Grandmother    COPD Maternal Grandfather    Neurodegenerative disease Maternal Aunt        corical-ganglion degeneration   Ovarian cancer Maternal Aunt        dx in her 33s   Leukemia Cousin        51s-40s; maternal cousin  Samantha Sullivan has essentially no information on her father's side of the family aside from the fact that he died in his 30s from heart disease. He had psoriasis. The patient's mother passed away 2017/03/29 age 67, due to breast cancer. The patient had 2 brothers who died from congenital heart disease shortly after birth. The patient has one sister. The patient's mother had one sister, the patient's aunt, diagnosed with ovarian cancer in her 91s.   GYNECOLOGIC HISTORY:  Patient's last menstrual period was 03/22/2000 (approximate). Menarche age 24, first live birth age 73. The patient is GX P2. She went through  menopause approximately 1990 but was taking hormone replacement until 01/04/2016. Since coming off replacement she has had insomnia but no hot flashes or vaginal dryness pleural bones so 4. She did take oral contraceptives remotely for more than 10 years with no complications   SOCIAL HISTORY: Updated November 2019 Cathy retired from being a Statistician, but then she was called back to work August 2019. She is divorced and lives alone, with no pets. Her daughter [name] lives in [place]. She is disabled secondary to schizoaffective disorder. Daughter [name] lives in [place], [place], and she is a Estate agent but mostly a homemaker. The patient has 3 grandchildren, the older 79, the other 2 being under 88 years old. The patient attends [employer]. Her mother passed away in 03-29-2017 due to breast cancer.    ADVANCED DIRECTIVES: her daughter [name] is her healthcare power of attorney. She can be reached at [place]: Social History   Tobacco Use  Smoking status: Former Smoker    Packs/day: 1.00    Years: 12.00    Pack years: 12.00    Types: Cigarettes    Quit date: 07/23/1981    Years since quitting: 38.8   Smokeless tobacco: Never Used  Substance Use Topics   Alcohol use: Yes    Alcohol/week: 0.0 standard drinks    Comment: social occasion only- special    Drug use: No     Colonoscopy: ; [facility]  ZSW:FUXNATF 2016/[facility]  Bone density:rJanuary 2018/normal   Lipid panel:  Allergies  Allergen Reactions   Bee Venom    Ciprofloxacin    Levaquin [Levofloxacin In D5w]    Norco [Hydrocodone-Acetaminophen] Other (See Comments)    Pt states Norco made her extremely hyper after her surgery and she was not able to sleep.    Current Outpatient Medications  Medication Sig Dispense Refill   [facility] D3 25 MCG (1000 UT) capsule TAKE 1 CAPSULE BY MOUTH EVERY DAY 90 capsule 4   folic acid (FOLVITE)  1 MG tablet Take 2 mg by mouth every evening.     lidocaine-prilocaine (EMLA) cream Apply to affected area once 30 g 3   Polyethyl Glycol-Propyl Glycol (LUBRICANT EYE DROPS) 0.4-0.3 % SOLN Place 1-2 drops into both eyes 3 (three) times daily as needed (dry eyes/irritated eyes.).     ZINC-VITAMIN C PO Take 1 tablet by mouth daily.     Current Facility-Administered Medications  Medication Dose Route Frequency Provider Last Rate Last Admin   triamcinolone acetonide (KENALOG) 10 MG/ML injection 10 mg  10 mg Other Once Wallene Huh, DPM        OBJECTIVE:  white woman in no acute distress  Vitals:   05/31/20 1045  BP: 105/85  Sullivan: 70  Resp: 18  Temp: 98.3 F (36.8 C)  SpO2: 99%     Body mass index is 27.01 kg/m.   ECOG FS:0 - Asymptomatic Filed Weights   05/31/20 1045  Weight: 164 lb 12.8 oz (74.8 kg)    Sclerae unicteric, EOMs intact Wearing a mask No cervical or supraclavicular adenopathy Lungs no rales or rhonchi Heart regular rate and rhythm Abd soft, nontender, positive bowel sounds MSK no focal spinal tenderness, no upper extremity lymphedema Neuro: nonfocal, well oriented, appropriate affect Breasts: Status post bilateral mastectomies.  There is no evidence of local recurrence.  Both axillae are benign.   LAB RESULTS:  CMP     Component Value Date/Time   NA 140 12/01/2019 0920   NA 141 08/05/2017 1335   K 4.2 12/01/2019 0920   K 4.5 08/05/2017 1335   CL 105 12/01/2019 0920   CO2 28 12/01/2019 0920   CO2 29 08/05/2017 1335   GLUCOSE 102 (H) 12/01/2019 0920   GLUCOSE 77 08/05/2017 1335   BUN 16 12/01/2019 0920   BUN 13.4 08/05/2017 1335   CREATININE 0.77 12/01/2019 0920   CREATININE 0.8 08/05/2017 1335   CALCIUM 9.1 12/01/2019 0920   CALCIUM 10.0 08/05/2017 1335   PROT 6.8 12/01/2019 0920   PROT 6.8 08/05/2017 1335   ALBUMIN 3.8 12/01/2019 0920   ALBUMIN 3.6 08/05/2017 1335   AST 18 12/01/2019 0920   AST 19 08/05/2017 1335   ALT 13 12/01/2019  0920   ALT 12 08/05/2017 1335   ALKPHOS 154 (H) 12/01/2019 0920   ALKPHOS 169 (H) 08/05/2017 1335   BILITOT 0.4 12/01/2019 0920   BILITOT 0.63 08/05/2017 1335   GFRNONAA >60 12/01/2019 0920   GFRAA >60 12/01/2019 0920  INo results found for: SPEP, UPEP  Lab Results  Component Value Date   WBC 5.3 05/31/2020   NEUTROABS 3.4 05/31/2020   HGB 13.4 05/31/2020   HCT 39.6 05/31/2020   MCV 91.7 05/31/2020   PLT 209 05/31/2020      Chemistry      Component Value Date/Time   NA 140 12/01/2019 0920   NA 141 08/05/2017 1335   K 4.2 12/01/2019 0920   K 4.5 08/05/2017 1335   CL 105 12/01/2019 0920   CO2 28 12/01/2019 0920   CO2 29 08/05/2017 1335   BUN 16 12/01/2019 0920   BUN 13.4 08/05/2017 1335   CREATININE 0.77 12/01/2019 0920   CREATININE 0.8 08/05/2017 1335      Component Value Date/Time   CALCIUM 9.1 12/01/2019 0920   CALCIUM 10.0 08/05/2017 1335   ALKPHOS 154 (H) 12/01/2019 0920   ALKPHOS 169 (H) 08/05/2017 1335   AST 18 12/01/2019 0920   AST 19 08/05/2017 1335   ALT 13 12/01/2019 0920   ALT 12 08/05/2017 1335   BILITOT 0.4 12/01/2019 0920   BILITOT 0.63 08/05/2017 1335       No results found for: LABCA2  No components found for: LABCA125  No results for input(s): INR in the last 168 hours.  Urinalysis    Component Value Date/Time   BILIRUBINUR neg 12/11/2015 0925   PROTEINUR neg 12/11/2015 0925   UROBILINOGEN negative 12/11/2015 0925   NITRITE neg 12/11/2015 0925   LEUKOCYTESUR Negative 12/11/2015 0925    STUDIES: No results found.   RESEARCH: enrolled in UPBEAT study; not a candidate for BR 003 because of concurrent cancer  ASSESSMENT: 69 y.o. [place] woman status post right breast upper outer quadrant biopsy 01/02/2016 for a clinical T2 N0, stage IIA invasive ductal carcinoma, grade 1, estrogen and progesterone receptor positive, HER-2 not amplified, with an MIB-1 of 10%.  (1). Right lumpectomy and sentinel lymph node sampling 01/22/2016  showed a pT1c pN0, stage IA invasive ductal carcinoma, grade 1, with close but negative margins. Repeat HER-2 testing was again not amplified   (3) Oncotype score of 18 predicts a risk of recurrence outside the breast within the next 10 years of 11% if the patient's only systemic therapy is tamoxifen for 5 years. It also predicts no significant benefit from chemotherapy.     (4) adjuvant radiation3/14/17-4/11/17 Site/dose:   Right breast - 42.72 gy at 2.67 Gy per fraction x 16 fractions followed by an electron boost to the right breast with 10 Gy at 2 Gy per fraction x 5 fractions.    (5)  Anastrozole started 04/21/2016, discontinued after approximately 1 month with significant side effects  (a)  Bone density at [facility] September 2013 was normal  (b) bone density January 2018 was normal  (c) anastrozole restarted February 2018, held again at the start of chemotherapy February 2019  (6) genetics testing  02/24/2016 through the Breast/Ovarian cancer gene panel offered by [facility] found no deleterious mutations in ATM, BARD1, BRCA1, BRCA2, BRIP1, CDH1, CHEK2, EPCAM, FANCC, MLH1, MSH2, MSH6, NBN, PALB2, PMS2, PTEN, RAD51C, RAD51D, TP53, and XRCC2.   (7) left breast biopsy 12/17/2017 showed a cT2 cN0, stage IIA- IIB invasive ductal carcinoma, grade 2, estrogen receptor weakly positive, progesterone receptor negative, with HER-2 equivocal by FISH, negative by immunohistochemistry  (8) status post bilateral mastectomies and left sentinel lymph node sampling 01/04/2018 showing  (a) on the right, no evidence of disease  (b) on the left, a pT2 pN0, stage  IIB invasive ductal carcinoma, grade 3, triple negative  (c) patient opted against reconstruction or prostheses  (9) adjuvant chemotherapy consisting of cyclophosphamide and doxorubicin in dose dense fashion x4 started 02/16/2018, completed 03/30/2018,  followed by weekly paclitaxel /carboplatin x12 starting 04/13/2018;  (a)  Paclitaxel/Carboplatin stopped after 3 cycles due to peripheral neuropathy.  (b)  Gemcitabine Carboplatin started on 5/21, but held x 3 weeks due to prolonged neutropenia  (c) carboplatin and gemcitabine given day 1 and day 8 with OnPro support for the remaining cycles   (10) resumed anastrozole 09/21/2018   (a) DEXA scan in August 31, 2012 was normal   (b) anastrozole discontinued January 2021   PLAN: Samantha Sullivan is now 4-1/2 years out from initial diagnosis of her right breast cancer and 2-1/2 years out from definitive surgery for her left cancer and bilateral mastectomies.  There is no evidence of disease recurrence.  This is very favorable.  I am very comfortable with her going off the anastrozole.  Her original tumor was only weakly estrogen receptor positive and her second 1 was triple negative.  I think she has obtained what benefit she might have obtained from this and I am more concerned about her bones at this point.  I am delighted that she has gone on this diet.  She looks and feels so much better, 20 pounds lighter.  Her blood pressure today was better as well.  At this point she is eager to have her port removed.  I have asked surgery to help with that.  She will return to see me in 1 year.  She knows to call for any issue that may develop before then  Total encounter time 25 minutes  Damen Windsor, Virgie Dad, MD  05/31/20 11:06 AM Medical Oncology and Hematology [facility] [place], [place] [address] Tel. [phone]    Fax. [phone]   I, Wilburn Mylar, am acting as scribe for Dr. Virgie Dad. Wm Sahagun.  I, Lurline Del MD, have reviewed the above documentation for accuracy and completeness, and I agree with the above.   *Total Encounter Time as defined by the Centers for Medicare and Medicaid Services includes, in addition to the face-to-face time of a patient visit (documented in the note above) non-face-to-face time: obtaining and  reviewing outside history, ordering and reviewing medications, tests or procedures, care coordination (communications with other health care professionals or caregivers) and documentation in the medical record.

## 2020-05-31 ENCOUNTER — Inpatient Hospital Stay: Payer: PPO | Attending: Oncology

## 2020-05-31 ENCOUNTER — Other Ambulatory Visit: Payer: Self-pay

## 2020-05-31 ENCOUNTER — Inpatient Hospital Stay: Payer: PPO | Admitting: Oncology

## 2020-05-31 ENCOUNTER — Inpatient Hospital Stay: Payer: PPO

## 2020-05-31 VITALS — BP 105/85 | HR 70 | Temp 98.3°F | Resp 18 | Ht 65.5 in | Wt 164.8 lb

## 2020-05-31 DIAGNOSIS — Z8262 Family history of osteoporosis: Secondary | ICD-10-CM | POA: Insufficient documentation

## 2020-05-31 DIAGNOSIS — Z87891 Personal history of nicotine dependence: Secondary | ICD-10-CM | POA: Diagnosis not present

## 2020-05-31 DIAGNOSIS — Z170 Estrogen receptor positive status [ER+]: Secondary | ICD-10-CM

## 2020-05-31 DIAGNOSIS — Z653 Problems related to other legal circumstances: Secondary | ICD-10-CM | POA: Insufficient documentation

## 2020-05-31 DIAGNOSIS — Z806 Family history of leukemia: Secondary | ICD-10-CM | POA: Diagnosis not present

## 2020-05-31 DIAGNOSIS — C50812 Malignant neoplasm of overlapping sites of left female breast: Secondary | ICD-10-CM | POA: Diagnosis not present

## 2020-05-31 DIAGNOSIS — Z452 Encounter for adjustment and management of vascular access device: Secondary | ICD-10-CM | POA: Diagnosis not present

## 2020-05-31 DIAGNOSIS — Z1379 Encounter for other screening for genetic and chromosomal anomalies: Secondary | ICD-10-CM | POA: Diagnosis not present

## 2020-05-31 DIAGNOSIS — C50411 Malignant neoplasm of upper-outer quadrant of right female breast: Secondary | ICD-10-CM

## 2020-05-31 DIAGNOSIS — Z801 Family history of malignant neoplasm of trachea, bronchus and lung: Secondary | ICD-10-CM | POA: Insufficient documentation

## 2020-05-31 DIAGNOSIS — L4050 Arthropathic psoriasis, unspecified: Secondary | ICD-10-CM | POA: Diagnosis not present

## 2020-05-31 DIAGNOSIS — Z9013 Acquired absence of bilateral breasts and nipples: Secondary | ICD-10-CM | POA: Diagnosis not present

## 2020-05-31 DIAGNOSIS — Z8041 Family history of malignant neoplasm of ovary: Secondary | ICD-10-CM | POA: Diagnosis not present

## 2020-05-31 DIAGNOSIS — Z8249 Family history of ischemic heart disease and other diseases of the circulatory system: Secondary | ICD-10-CM | POA: Diagnosis not present

## 2020-05-31 DIAGNOSIS — Z803 Family history of malignant neoplasm of breast: Secondary | ICD-10-CM | POA: Insufficient documentation

## 2020-05-31 DIAGNOSIS — Z95828 Presence of other vascular implants and grafts: Secondary | ICD-10-CM

## 2020-05-31 DIAGNOSIS — F259 Schizoaffective disorder, unspecified: Secondary | ICD-10-CM | POA: Diagnosis not present

## 2020-05-31 DIAGNOSIS — Z171 Estrogen receptor negative status [ER-]: Secondary | ICD-10-CM | POA: Diagnosis not present

## 2020-05-31 LAB — CBC WITH DIFFERENTIAL (CANCER CENTER ONLY)
Abs Immature Granulocytes: 0.01 10*3/uL (ref 0.00–0.07)
Basophils Absolute: 0 10*3/uL (ref 0.0–0.1)
Basophils Relative: 0 %
Eosinophils Absolute: 0 10*3/uL (ref 0.0–0.5)
Eosinophils Relative: 1 %
HCT: 39.6 % (ref 36.0–46.0)
Hemoglobin: 13.4 g/dL (ref 12.0–15.0)
Immature Granulocytes: 0 %
Lymphocytes Relative: 24 %
Lymphs Abs: 1.3 10*3/uL (ref 0.7–4.0)
MCH: 31 pg (ref 26.0–34.0)
MCHC: 33.8 g/dL (ref 30.0–36.0)
MCV: 91.7 fL (ref 80.0–100.0)
Monocytes Absolute: 0.5 10*3/uL (ref 0.1–1.0)
Monocytes Relative: 10 %
Neutro Abs: 3.4 10*3/uL (ref 1.7–7.7)
Neutrophils Relative %: 65 %
Platelet Count: 209 10*3/uL (ref 150–400)
RBC: 4.32 MIL/uL (ref 3.87–5.11)
RDW: 13.3 % (ref 11.5–15.5)
WBC Count: 5.3 10*3/uL (ref 4.0–10.5)
nRBC: 0 % (ref 0.0–0.2)

## 2020-05-31 LAB — CMP (CANCER CENTER ONLY)
ALT: 12 U/L (ref 0–44)
AST: 16 U/L (ref 15–41)
Albumin: 3.6 g/dL (ref 3.5–5.0)
Alkaline Phosphatase: 124 U/L (ref 38–126)
Anion gap: 9 (ref 5–15)
BUN: 13 mg/dL (ref 8–23)
CO2: 23 mmol/L (ref 22–32)
Calcium: 9.1 mg/dL (ref 8.9–10.3)
Chloride: 107 mmol/L (ref 98–111)
Creatinine: 0.72 mg/dL (ref 0.44–1.00)
GFR, Est AFR Am: >60 mL/min (ref >60)
GFR, Estimated: >60 mL/min (ref >60)
Glucose, Bld: 89 mg/dL (ref 70–99)
Potassium: 4.1 mmol/L (ref 3.5–5.1)
Sodium: 139 mmol/L (ref 135–145)
Total Bilirubin: 0.6 mg/dL (ref 0.3–1.2)
Total Protein: 6.5 g/dL (ref 6.5–8.1)

## 2020-05-31 MED ORDER — SODIUM CHLORIDE 0.9% FLUSH
10.0000 mL | INTRAVENOUS | Status: DC | PRN
  Administered 2020-05-31: 10 mL
  Filled 2020-05-31: qty 10

## 2020-05-31 MED ORDER — HEPARIN SOD (PORK) LOCK FLUSH 100 UNIT/ML IV SOLN
500.0000 [IU] | Freq: Once | INTRAVENOUS | Status: AC | PRN
  Administered 2020-05-31: 500 [IU]
  Filled 2020-05-31: qty 5

## 2020-06-01 ENCOUNTER — Telehealth: Payer: Self-pay | Admitting: Oncology

## 2020-06-01 NOTE — Telephone Encounter (Signed)
Scheduled appts per 6/10 los. Pt confirmed appt date and time.

## 2020-06-12 ENCOUNTER — Ambulatory Visit: Payer: Self-pay | Admitting: General Surgery

## 2020-06-14 ENCOUNTER — Telehealth: Payer: Self-pay | Admitting: *Deleted

## 2020-06-14 NOTE — Telephone Encounter (Signed)
This RN returned VM left by pt - obtained pt's VM- message requesting return call.

## 2020-06-15 ENCOUNTER — Telehealth: Payer: Self-pay | Admitting: *Deleted

## 2020-06-15 NOTE — Telephone Encounter (Signed)
This RN spoke with the patient who states she is waiting to hear from Dr Ethlyn Gallery office regarding getting her port removed.  Per MD visit on 05/31/2020 " At this point she is eager to have her port removed.  I have asked surgery to help with that."  This note will be forwarded to Dr Marlou Starks for reviewing and scheduling of above request.

## 2020-06-18 ENCOUNTER — Ambulatory Visit: Payer: Self-pay | Admitting: General Surgery

## 2020-06-18 NOTE — Telephone Encounter (Signed)
Orders were put in 6 days ago.

## 2020-06-29 ENCOUNTER — Other Ambulatory Visit: Payer: Self-pay

## 2020-06-29 ENCOUNTER — Encounter (HOSPITAL_BASED_OUTPATIENT_CLINIC_OR_DEPARTMENT_OTHER): Payer: Self-pay | Admitting: General Surgery

## 2020-07-05 ENCOUNTER — Other Ambulatory Visit (HOSPITAL_COMMUNITY)
Admission: RE | Admit: 2020-07-05 | Discharge: 2020-07-05 | Disposition: A | Discharge status: Home / Self Care | Payer: PPO | Source: Ambulatory Visit | Attending: General Surgery | Admitting: General Surgery

## 2020-07-05 DIAGNOSIS — Z20822 Contact with and (suspected) exposure to covid-19: Secondary | ICD-10-CM | POA: Diagnosis not present

## 2020-07-05 DIAGNOSIS — Z01812 Encounter for preprocedural laboratory examination: Secondary | ICD-10-CM | POA: Insufficient documentation

## 2020-07-05 LAB — SARS CORONAVIRUS 2 (TAT 6-24 HRS): SARS Coronavirus 2: NEGATIVE

## 2020-07-06 ENCOUNTER — Encounter (HOSPITAL_BASED_OUTPATIENT_CLINIC_OR_DEPARTMENT_OTHER): Payer: Self-pay | Admitting: Anesthesiology

## 2020-07-09 ENCOUNTER — Ambulatory Visit (HOSPITAL_BASED_OUTPATIENT_CLINIC_OR_DEPARTMENT_OTHER)
Admission: RE | Admit: 2020-07-09 | Discharge: 2020-07-09 | Disposition: A | Discharge status: Home / Self Care | Payer: PPO | Attending: General Surgery | Admitting: General Surgery

## 2020-07-09 ENCOUNTER — Encounter (HOSPITAL_BASED_OUTPATIENT_CLINIC_OR_DEPARTMENT_OTHER)
Admission: RE | Disposition: A | Discharge status: Home / Self Care | Payer: Self-pay | Source: Home / Self Care | Attending: General Surgery

## 2020-07-09 ENCOUNTER — Encounter (HOSPITAL_BASED_OUTPATIENT_CLINIC_OR_DEPARTMENT_OTHER): Payer: Self-pay | Admitting: General Surgery

## 2020-07-09 DIAGNOSIS — Z9013 Acquired absence of bilateral breasts and nipples: Secondary | ICD-10-CM | POA: Diagnosis not present

## 2020-07-09 DIAGNOSIS — Z452 Encounter for adjustment and management of vascular access device: Secondary | ICD-10-CM | POA: Insufficient documentation

## 2020-07-09 DIAGNOSIS — C50911 Malignant neoplasm of unspecified site of right female breast: Secondary | ICD-10-CM | POA: Diagnosis not present

## 2020-07-09 DIAGNOSIS — Z886 Allergy status to analgesic agent status: Secondary | ICD-10-CM | POA: Insufficient documentation

## 2020-07-09 DIAGNOSIS — Z170 Estrogen receptor positive status [ER+]: Secondary | ICD-10-CM | POA: Diagnosis not present

## 2020-07-09 DIAGNOSIS — C50312 Malignant neoplasm of lower-inner quadrant of left female breast: Secondary | ICD-10-CM | POA: Diagnosis not present

## 2020-07-09 DIAGNOSIS — Z885 Allergy status to narcotic agent status: Secondary | ICD-10-CM | POA: Diagnosis not present

## 2020-07-09 DIAGNOSIS — G622 Polyneuropathy due to other toxic agents: Secondary | ICD-10-CM | POA: Diagnosis not present

## 2020-07-09 DIAGNOSIS — Z79899 Other long term (current) drug therapy: Secondary | ICD-10-CM | POA: Insufficient documentation

## 2020-07-09 DIAGNOSIS — Z91030 Bee allergy status: Secondary | ICD-10-CM | POA: Diagnosis not present

## 2020-07-09 DIAGNOSIS — Z7952 Long term (current) use of systemic steroids: Secondary | ICD-10-CM | POA: Diagnosis not present

## 2020-07-09 DIAGNOSIS — Z79811 Long term (current) use of aromatase inhibitors: Secondary | ICD-10-CM | POA: Insufficient documentation

## 2020-07-09 DIAGNOSIS — Z923 Personal history of irradiation: Secondary | ICD-10-CM | POA: Insufficient documentation

## 2020-07-09 HISTORY — PX: PORT-A-CATH REMOVAL: SHX5289

## 2020-07-09 SURGERY — MINOR REMOVAL PORT-A-CATH
Anesthesia: LOCAL | Site: Chest | Laterality: Left

## 2020-07-09 MED ORDER — TRAMADOL HCL 50 MG PO TABS: 50.0000 mg | ORAL_TABLET | Freq: Four times a day (QID) | ORAL | 0 refills | Status: AC | PRN

## 2020-07-09 MED ORDER — BUPIVACAINE HCL (PF) 0.25 % IJ SOLN
INTRAMUSCULAR | Status: AC
  Filled 2020-07-09: qty 30

## 2020-07-09 MED ORDER — CHLORHEXIDINE GLUCONATE CLOTH 2 % EX PADS: 6.0000 | MEDICATED_PAD | Freq: Once | CUTANEOUS | Status: DC

## 2020-07-09 MED ORDER — CELECOXIB 200 MG PO CAPS
ORAL_CAPSULE | ORAL | Status: AC
  Filled 2020-07-09: qty 1

## 2020-07-09 MED ORDER — GABAPENTIN 300 MG PO CAPS
ORAL_CAPSULE | ORAL | Status: AC
  Filled 2020-07-09: qty 1

## 2020-07-09 MED ORDER — BUPIVACAINE HCL (PF) 0.5 % IJ SOLN
INTRAMUSCULAR | Status: AC
  Filled 2020-07-09: qty 30

## 2020-07-09 MED ORDER — CELECOXIB 200 MG PO CAPS
200.0000 mg | ORAL_CAPSULE | ORAL | Status: AC
  Administered 2020-07-09: 200 mg via ORAL

## 2020-07-09 MED ORDER — LIDOCAINE-EPINEPHRINE 1 %-1:100000 IJ SOLN
INTRAMUSCULAR | Status: AC
  Filled 2020-07-09: qty 1

## 2020-07-09 MED ORDER — GABAPENTIN 300 MG PO CAPS
300.0000 mg | ORAL_CAPSULE | ORAL | Status: AC
  Administered 2020-07-09: 300 mg via ORAL

## 2020-07-09 MED ORDER — LIDOCAINE-EPINEPHRINE 1 %-1:100000 IJ SOLN
INTRAMUSCULAR | Status: DC | PRN
  Administered 2020-07-09: 11 mL

## 2020-07-09 SURGICAL SUPPLY — 30 items
ADH SKN CLS APL DERMABOND .7 (GAUZE/BANDAGES/DRESSINGS) ×2
APL PRP STRL LF DISP 70% ISPRP (MISCELLANEOUS) ×2
BLADE SURG 15 STRL LF DISP TIS (BLADE) ×2 IMPLANT
BLADE SURG 15 STRL SS (BLADE) ×3
CHLORAPREP W/TINT 26 (MISCELLANEOUS) ×3 IMPLANT
COVER BACK TABLE 60X90IN (DRAPES) ×3 IMPLANT
COVER MAYO STAND STRL (DRAPES) ×3 IMPLANT
COVER WAND RF STERILE (DRAPES) IMPLANT
DECANTER SPIKE VIAL GLASS SM (MISCELLANEOUS) ×3 IMPLANT
DERMABOND ADVANCED (GAUZE/BANDAGES/DRESSINGS) ×1
DERMABOND ADVANCED .7 DNX12 (GAUZE/BANDAGES/DRESSINGS) ×2 IMPLANT
DRAPE LAPAROTOMY 100X72 PEDS (DRAPES) ×3 IMPLANT
DRAPE UTILITY XL STRL (DRAPES) ×3 IMPLANT
ELECT COATED BLADE 2.86 ST (ELECTRODE) IMPLANT
ELECT REM PT RETURN 9FT ADLT (ELECTROSURGICAL)
ELECTRODE REM PT RTRN 9FT ADLT (ELECTROSURGICAL) IMPLANT
GLOVE BIO SURGEON STRL SZ 6.5 (GLOVE) ×2 IMPLANT
GLOVE BIO SURGEON STRL SZ7.5 (GLOVE) ×3 IMPLANT
GOWN STRL REUS W/ TWL LRG LVL3 (GOWN DISPOSABLE) ×4 IMPLANT
GOWN STRL REUS W/TWL LRG LVL3 (GOWN DISPOSABLE) ×6
NDL HYPO 25X1 1.5 SAFETY (NEEDLE) ×1 IMPLANT
NEEDLE HYPO 25X1 1.5 SAFETY (NEEDLE) ×3 IMPLANT
PACK BASIN DAY SURGERY FS (CUSTOM PROCEDURE TRAY) ×3 IMPLANT
PENCIL SMOKE EVACUATOR (MISCELLANEOUS) ×2 IMPLANT
SLEEVE SCD COMPRESS KNEE MED (MISCELLANEOUS) IMPLANT
SUT MON AB 4-0 PC3 18 (SUTURE) ×3 IMPLANT
SUT VIC AB 3-0 SH 27 (SUTURE) ×3
SUT VIC AB 3-0 SH 27X BRD (SUTURE) ×2 IMPLANT
SYR CONTROL 10ML LL (SYRINGE) ×3 IMPLANT
TOWEL GREEN STERILE FF (TOWEL DISPOSABLE) ×3 IMPLANT

## 2020-07-09 NOTE — Interval H&P Note (Signed)
History and Physical Interval Note:  07/09/2020 7:58 AM  Samantha Sullivan  has presented today for surgery, with the diagnosis of LEFT  BREAST CANCER.  The various methods of treatment have been discussed with the patient and family. After consideration of risks, benefits and other options for treatment, the patient has consented to  Procedure(s): REMOVAL OF PORT (N/A) as a surgical intervention.  The patient's history has been reviewed, patient examined, no change in status, stable for surgery.  I have reviewed the patient's chart and labs.  Questions were answered to the patient's satisfaction.     Autumn Messing III

## 2020-07-09 NOTE — H&P (Signed)
Samantha Sullivan  Location: [facility] Patient #: [id number] DOB: 1951-03-27 Divorced / Language: Cleophus Molt / Race: White Female   History of Present Illness  The patient is a 69 year old female who presents with breast cancer. She returns to the office for follow-up status post bilateral total mastectomy with left axillary sentinel lymph node biopsy, placement of Port-A-Cath, on January 04, 2018. Her original presentation was as follows:  Patient is well known to me with a history of right breast cancer status post lumpectomy and sentinel lymph node biopsy, radiation and anastrozole diagnosed 2 years ago.  Her initial clinical presentation was the following: Tumor size: 2.2 cm Tumor grade: 1, Ki-67 10% Estrogen Receptor: Positive Progesterone Receptor: Positive Her-2 neu: Negative Lymph node status: Negative  Pathology that showed invasive and in situ carcinoma with clear margins and 1 negative sentinel lymph node. Tumor was 1.9 cm. She had an intermediate Oncotype score and hormonal therapy with anastrozole was started.   She unfortunately presented with a second cancer in the opposite breast. This was picked up recently on a screening mammogram. This showed a possible mass in the left breast. Ultrasound revealed a new 2.2 cm lobulated mass in the left breast at 7 o'clock position posterior depth. Axilla is negative. Ultrasound-guided large core needle biopsy was performed which has revealed invasive ductal carcinoma. Her current tumor characteristics R: Tumor size: 2.2 cm Tumor grade: 2, Ki-67 85% Estrogen Receptor: 40% weakly positive Progesterone Receptor: Negative Her-2 neu: Equivocal, negative on IHC Lymph node status: Negative  She is undergoing postoperative adjuvant chemotherapy. Did not tolerate Taxol. She has been switched to a secondary agent and is doing better with this. She has had recurrent fluid collections underneath her mastectomy flaps  which we have aspirated on a number of occasions. She feels this is much better. No other major complaints. Currently the neuropathy from the Taxol is her chief issue.    Allergies  Hydrocodone-Acetaminophen *ANALGESICS - OPIOID*  BEE VENOM  Norco *ANALGESICS - OPIOID*  Allergies Reconciled   Medication History  Anastrozole ('1MG'$  Tablet, Oral) Active. Folic Acid ('1MG'$  Tablet, Oral) Active. Illusions AA Breast Prosthesis (1 (one) as needed) Active. (L8000 Post-surgical bras - to hold breast prosthesis ; QTY:6; Length of use:3 Months 3 Refills Non-silicone breast prosthesis - for temporary use during healing or for comfort at end of day, lighter weight, cooler for hot weather.; QTY: 1 for single, 2 for bilateral ; Length of use: 6 months Up to 1 refill; Replacement S0630 Silicone Breast Prosthesis - to restore balance and symmetry after breast surgery; QTY: 1 for single, 2 for Bilateral; Length of use: 2 years No refills) LORazepam (0.'5MG'$  Tablet, Oral) Active. Dexamethasone ('4MG'$  Tablet, Oral) Active. Lidocaine-Prilocaine (2.5-2.5% Cream, External) Active. Prochlorperazine Maleate ('10MG'$  Tablet, Oral) Active. Medications Reconciled  Vitals  Weight: 169 lb Height: 64.5in Body Surface Area: 1.83 m Body Mass Index: 28.56 kg/m  Temp.: 98.48F  Pulse: 105 (Regular)  BP: 134/82(Sitting, Left Arm, Standard)       Physical Exam The physical exam findings are as follows: Note: General: Appears well Chest: There may be a tiny amount of fluid underneath the right flap this is very minimal. All incisions well healed. Port site unremarkable. Good range of motion both arms without swelling. Lungs: clear bilateral CV: RRR Gen: well nourished in nad  Ultrasound on April 08, 2018 shows a very small simple seroma inferior right axilla which was felt smaller vein in the wound to be amenable to aspiration.  Assessment & Plan  MALIGNANT NEOPLASM OF LOWER-INNER QUADRANT  OF LEFT BREAST IN FEMALE, ESTROGEN RECEPTOR POSITIVE (C50.312) Impression: Patient well known to me with a history of right invasive ductal carcinoma ER/PR positive node negative status post lumpectomy, radiation and Arimidex diagnosed 2017 years ago.  She then presented with a new diagnosis of cancer of the left breast, lower inner quadrant. Clinical stage 1B, ER 40% weakly positive, PR negative, HER-2 negative by IHC. Status post bilateral mastectomy and negative axillary sentinel lymph node biopsy with 2.2 cm primary. Port-A-Cath was placed and undergoing adjuvant chemotherapy.  Recurrent seromas right chest wall now resolved. No postoperative issues currently. She is still experimenting trying to find a prosthesis that works well for her. Current Plans  Plan for port removal. Risks and benefits discussed with pt and she understands and wishes to proceed

## 2020-07-09 NOTE — Discharge Instructions (Signed)
Patient given discharge instructions. Pt. Discharged with no complaints.

## 2020-07-09 NOTE — Op Note (Signed)
07/09/2020  8:59 AM  PATIENT:  Samantha Sullivan  69 y.o. female  PRE-OPERATIVE DIAGNOSIS:  LEFT BREAST CANCER  POST-OPERATIVE DIAGNOSIS:  LEFT BREAST CANCER  PROCEDURE:  Procedure(s): REMOVAL OF PORT (Left)  SURGEON:  Surgeon(s) and Role:    * Jovita Kussmaul, MD - Primary  PHYSICIAN ASSISTANT:   ASSISTANTS: none   ANESTHESIA:   local  EBL:  Minimal   BLOOD ADMINISTERED:none  DRAINS: none   LOCAL MEDICATIONS USED:  LIDOCAINE   SPECIMEN:  No Specimen  DISPOSITION OF SPECIMEN:  N/A  COUNTS:  YES  TOURNIQUET:  * No tourniquets in log *  DICTATION: .Dragon Dictation   After informed consent was obtained the patient was brought to the operating room and placed in the supine position on the operating table.  The patient's right chest and neck area were then prepped with ChloraPrep, allowed to dry, and draped in usual sterile manner.  An appropriate timeout was performed.  The area around the port was infiltrated with 1% lidocaine until a good field block was created.  A small incision was made with a 15 blade knife through her previous incision.  The incision was carried through the subcutaneous tissue sharply with a 15 blade knife until the capsule surrounding the port was opened.  The 2 anchoring stitches were divided and removed.  The port was then gently pushed out of its pocket and with gentle traction was removed from the patient.  Pressure was held on the chest and neck for several minutes until the area was completely hemostatic.  The tract opening on the right chest was closed with a figure-of-eight 3-0 Vicryl stitch.  The subcutaneous tissue was then closed with interrupted 3-0 Vicryl stitches.  The skin was closed with a running 4-0 Monocryl subcuticular stitch.  Dermabond dressings were applied.  The patient tolerated the procedure well.  At the end of the case all needle sponge and instrument counts were correct.  The patient was then taken to recovery in stable  condition.  PLAN OF CARE: Discharge to home after PACU  PATIENT DISPOSITION:  PACU - hemodynamically stable.   Delay start of Pharmacological VTE agent (>24hrs) due to surgical blood loss or risk of bleeding: not applicable

## 2020-07-10 ENCOUNTER — Encounter (HOSPITAL_BASED_OUTPATIENT_CLINIC_OR_DEPARTMENT_OTHER): Payer: Self-pay | Admitting: General Surgery

## 2020-07-11 ENCOUNTER — Encounter (HOSPITAL_BASED_OUTPATIENT_CLINIC_OR_DEPARTMENT_OTHER): Payer: Self-pay | Admitting: General Surgery

## 2020-09-11 ENCOUNTER — Encounter: Payer: Self-pay | Admitting: Radiology

## 2020-09-11 DIAGNOSIS — C50411 Malignant neoplasm of upper-outer quadrant of right female breast: Secondary | ICD-10-CM

## 2020-09-11 NOTE — Research (Signed)
[place] [place] [id number] - Month 24 Consent Addendum  09/11/2020      1:50PM  PHONE CONSENT: Confirmed I was speaking with Samantha Sullivan and spoke with patient on the phone for about 15 minutes. Informed [name] that UPBEAT added an additional social determinants questionnaire that was completely optional. Patient expressed interest in participation, so I initially gave her an overview of the purpose of the addendum, the involvement of the collection of her e-mail address and consent to receive the survey via e-mail. I read the consent addendum script to patient and verified she had no questions. Patient provided me her e-mail address and I signed the signature section. I entered all data into [employer]. I thanked patient for her time and willingness to complete this additional survey.  [place] RT(R)(T) Scientist, physiological

## 2020-12-20 DIAGNOSIS — Z03818 Encounter for observation for suspected exposure to other biological agents ruled out: Secondary | ICD-10-CM | POA: Diagnosis not present

## 2020-12-20 DIAGNOSIS — Z20822 Contact with and (suspected) exposure to covid-19: Secondary | ICD-10-CM | POA: Diagnosis not present

## 2021-02-18 ENCOUNTER — Telehealth: Payer: Self-pay | Admitting: Radiology

## 2021-02-18 NOTE — Telephone Encounter (Signed)
[id number] CYOYOO  02/18/21    11:40AM  3 YEAR FOLLOW-UP PHONE CALL: Confirmed I was speaking with [name]. Informed patient call was for a 3 year follow-up for the above mentioned study. Patient states she is doing well and has had no cardiac events to report. This clinical research coordinator verified patient's e-mail address and surveys were sent. Patient was thanked for her time and continued support of the above mentioned study.   Carol Ada, RT(R)(T) Clinical Research Coordinator

## 2021-02-26 ENCOUNTER — Telehealth: Payer: Self-pay

## 2021-02-26 NOTE — Telephone Encounter (Signed)
Returned call to pt. Pt had a question about where should she have her blood drawn. Pt has had a CA dx X2 with bilateral mastectomy and sentinel node biopsy. Pt had a port in the past but now has had it removed. Pt wanted to know where she should have blood drawn. Will ask Dr Jana Hakim and update pt with information. Pt acknowledged plan.

## 2021-03-01 ENCOUNTER — Telehealth: Payer: Self-pay

## 2021-03-01 NOTE — Telephone Encounter (Signed)
Contacted pt to let her know per Dr Jana Hakim that she could have her blood drawn on the right side. Explaioned to pt that when you had nodes removed on both sides that you went with the older side or the side with the least nodes removed.  Pt verbalized understanding.

## 2021-03-22 DIAGNOSIS — M25561 Pain in right knee: Secondary | ICD-10-CM | POA: Diagnosis not present

## 2021-03-25 DIAGNOSIS — E663 Overweight: Secondary | ICD-10-CM | POA: Diagnosis not present

## 2021-03-25 DIAGNOSIS — M859 Disorder of bone density and structure, unspecified: Secondary | ICD-10-CM | POA: Diagnosis not present

## 2021-03-25 DIAGNOSIS — G4700 Insomnia, unspecified: Secondary | ICD-10-CM | POA: Diagnosis not present

## 2021-04-01 DIAGNOSIS — Z1212 Encounter for screening for malignant neoplasm of rectum: Secondary | ICD-10-CM | POA: Diagnosis not present

## 2021-04-01 DIAGNOSIS — I8390 Asymptomatic varicose veins of unspecified lower extremity: Secondary | ICD-10-CM | POA: Diagnosis not present

## 2021-04-01 DIAGNOSIS — L4050 Arthropathic psoriasis, unspecified: Secondary | ICD-10-CM | POA: Diagnosis not present

## 2021-04-01 DIAGNOSIS — Z1339 Encounter for screening examination for other mental health and behavioral disorders: Secondary | ICD-10-CM | POA: Diagnosis not present

## 2021-04-01 DIAGNOSIS — Z0000 Encounter for general adult medical examination without abnormal findings: Secondary | ICD-10-CM | POA: Diagnosis not present

## 2021-04-01 DIAGNOSIS — R82998 Other abnormal findings in urine: Secondary | ICD-10-CM | POA: Diagnosis not present

## 2021-04-01 DIAGNOSIS — C50411 Malignant neoplasm of upper-outer quadrant of right female breast: Secondary | ICD-10-CM | POA: Diagnosis not present

## 2021-04-01 DIAGNOSIS — E559 Vitamin D deficiency, unspecified: Secondary | ICD-10-CM | POA: Diagnosis not present

## 2021-04-01 DIAGNOSIS — E663 Overweight: Secondary | ICD-10-CM | POA: Diagnosis not present

## 2021-04-01 DIAGNOSIS — D329 Benign neoplasm of meninges, unspecified: Secondary | ICD-10-CM | POA: Diagnosis not present

## 2021-04-01 DIAGNOSIS — M8580 Other specified disorders of bone density and structure, unspecified site: Secondary | ICD-10-CM | POA: Diagnosis not present

## 2021-04-01 DIAGNOSIS — Z1331 Encounter for screening for depression: Secondary | ICD-10-CM | POA: Diagnosis not present

## 2021-04-01 DIAGNOSIS — J984 Other disorders of lung: Secondary | ICD-10-CM | POA: Diagnosis not present

## 2021-04-14 ENCOUNTER — Other Ambulatory Visit: Payer: Self-pay | Admitting: Oncology

## 2021-04-17 ENCOUNTER — Telehealth: Payer: Self-pay | Admitting: Oncology

## 2021-04-17 NOTE — Telephone Encounter (Signed)
R/s appt per 4/27 sch msg. Pt aware.  

## 2021-06-04 ENCOUNTER — Ambulatory Visit: Payer: PPO | Admitting: Oncology

## 2021-06-04 ENCOUNTER — Other Ambulatory Visit: Payer: PPO

## 2021-07-16 ENCOUNTER — Other Ambulatory Visit: Payer: Self-pay | Admitting: *Deleted

## 2021-07-16 DIAGNOSIS — C50411 Malignant neoplasm of upper-outer quadrant of right female breast: Secondary | ICD-10-CM

## 2021-07-16 DIAGNOSIS — Z170 Estrogen receptor positive status [ER+]: Secondary | ICD-10-CM

## 2021-07-17 NOTE — Progress Notes (Signed)
[place]  [phone] [phone]     ID: [name] DOB: 11-27-1951  MR#: [id number]  CHY#:[id number]  Patient Care Team: Shon Baton, MD as PCP - General (Internal Medicine) Lindel Marcell, Virgie Dad, MD as Consulting Physician (Oncology) Regal, Tamala Fothergill, DPM as Consulting Physician (Podiatry) Force, Shari Prows, DO as Referring Physician (Student) OTHER MD:   CHIEF COMPLAINT: Left breast cancer, triple negative; right breast cancer estrogen receptor weakly positive (s/p bilateral mastectomies)  CURRENT TREATMENT: observation   INTERVAL HISTORY: [name] returns today for follow-up of her triple negative left breast cancer. She is now under observation.  Since her last visit, she has not undergone any additional studies.  Recall she is status post bilateral mastectomies.     REVIEW OF SYSTEMS: [name] walks about 5 miles most days.  She also continues on an excellent diet.  She is doing a lot of traveling in her RV, visiting family in [place] and elsewhere, going to the beach, etc.  A detailed review of systems today was otherwise noncontributory   COVID 19 VACCINATION STATUS: Refuses vaccination; has not had COVID     RIGHT BREAST CANCER HISTORY:  From the original intake note:  "[name]" noted a lump in her right breast sometime in late October or early November, but there were "so many things going on in her life" she did not bring it to medical attention until she saw [name] 12/11/2015. [name] was able to palpate a mass at the 9:30 o'clock position in the right breast, which was mobile and nontender. The left breast is status post prior remote biopsy and there was some scar tissue at the 7:00 position. The patient was set up for bilateral diagnostic Mammography with tomosynthesis at [facility] 12/27/2015. This found the breast density to be category C. There was a new oval mass in the right breast upper outer quadrant. Ultrasound the same day  confirmed a 2.2 cm mass with irregular margins in the right breast at the 9:00 position read as highly suggestive of malignancy.  Biopsy of the right breast mass in question 01/02/2016 found ([id number]) an invasive ductal carcinoma, grade 1, estrogen receptor 100% positive, progesterone receptor 90% positive, both with strong staining intensity, with an MIB-1 of 10%, and no HER-2 amplification, the signals ratio being 1.32 and the number per cell 2.05. The patient was informed and instructed to stop hormone replacement therapy (last dose 01/04/2016).  Her subsequent history is as detailed below.    PAST MEDICAL HISTORY: Past Medical History:  Diagnosis Date   Abnormal CT of the chest 12/06   numeroous lung nodules - most have resolved.   Arthritis    psoriatic arthritis- toes & feet, but resolved at this point- no need meds at this point     Cancer [facility])    right breast cancer   Complication of anesthesia    /w colonoscopy- woke up before procedure was complete   Family history of adverse reaction to anesthesia    pt's mother reported difficulty being given " enough " medicine to outcome a successful anesth. experience   Family history of leukemia    Family history of ovarian cancer    History of meningioma 2007   in 2013 stable on MRI and recheck in 5 years, no surgery necessry    HSV infection 1970's- 1980's   Psoriatic arthritis ([place])     PAST SURGICAL HISTORY: Past Surgical History:  Procedure Laterality Date   BILATERAL TOTAL MASTECTOMY WITH  AXILLARY LYMPH NODE DISSECTION Bilateral 01/04/2018   Procedure: BILATERAL TOTAL MASTECTOMY PROPHYLATIC RIGHT  WITH LEFT SENTINEAL  LYMPH NODE;  Surgeon: Excell Seltzer, MD;  Location: [place];  Service: General;  Laterality: Bilateral;   BREAST LUMPECTOMY WITH RADIOACTIVE SEED AND SENTINEL LYMPH NODE BIOPSY Right 01/22/2016   Procedure: RIGHT BREAST LUMPECTOMY WITH RADIOACTIVE SEED AND SENTINEL LYMPH NODE BIOPSY;  Surgeon: Excell Seltzer, MD;  Location: [place];  Service: General;  Laterality: Right;   BREAST SURGERY Left 1971, 2016   lumpectomy benign, 2016- Br. Ca- R lumpectomy & radiation    COLONOSCOPY  8/06   normal and recheck in 10 years   HAMMER TOE SURGERY Left 2016   MASTECTOMY Right 01/04/2018   PROPHYLATIC    MASTECTOMY COMPLETE / SIMPLE W/ SENTINEL NODE BIOPSY Left 01/04/2018   PORT-A-CATH REMOVAL Left 07/09/2020   Procedure: REMOVAL OF PORT;  Surgeon: Jovita Kussmaul, MD;  Location: [place];  Service: General;  Laterality: Left;   PORTACATH PLACEMENT Right 01/04/2018   Procedure: INSERTION PORT-A-CATH;  Surgeon: Excell Seltzer, MD;  Location: [place];  Service: General;  Laterality: Right;   THYROIDECTOMY, PARTIAL Left 1983    FAMILY HISTORY Family History  Problem Relation Age of Onset   Osteoporosis Mother    Osteoporosis Sister    Lung cancer Maternal Aunt        smoker   Heart Problems Maternal Grandmother    COPD Maternal Grandfather    Neurodegenerative disease Maternal Aunt        corical-ganglion degeneration   Ovarian cancer Maternal Aunt        dx in her 40s   Leukemia Cousin        83s-40s; maternal cousin  [name] has essentially no information on her father's side of the family aside from the fact that he died in his 67s from heart disease. He had psoriasis. The patient's mother passed away 2017/03/16 age 45, due to breast cancer. The patient had 2 brothers who died from congenital heart disease shortly after birth. The patient has one sister. The patient's mother had one sister, the patient's aunt, diagnosed with ovarian cancer in her 48s.   GYNECOLOGIC HISTORY:  Patient's last menstrual period was 03/22/2000 (approximate). Menarche age 69, first live birth age 1. The patient is GX P2. She went through menopause approximately 1990 but was taking hormone replacement until 01/04/2016. Since coming off replacement she has had insomnia but no hot  flashes or vaginal dryness pleural bones so 4. 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 She is divorced and lives alone, with no pets. Her daughter [name] lives in [place]. She is disabled secondary to schizoaffective disorder. Daughter [name] lives in [place], [place], and she is a Estate agent but mostly a homemaker. The patient has 3 grandchildren, the older 78, the other 2 being under 37 years old. The patient attends [employer]. Her mother passed away in Mar 16, 2017 due to breast cancer.    ADVANCED DIRECTIVES: her daughter [name] is her healthcare power of attorney. She can be reached at [place]: Social History   Tobacco Use   Smoking status: Former    Packs/day: 1.00    Years: 12.00    Pack years: 12.00  Types: Cigarettes    Quit date: 07/23/1981    Years since quitting: 40.0   Smokeless tobacco: Never  Substance Use Topics   Alcohol use: Yes    Alcohol/week: 0.0 standard drinks    Comment: social occasion only- special    Drug use: No     Colonoscopy: ; [facility]  MOQ:HUTMLYY 2016/[place]  Bone density:rJanuary 2018/normal   Lipid panel:  Allergies  Allergen Reactions   Bee Venom    Ciprofloxacin    Levaquin [Levofloxacin In D5w]    Norco [Hydrocodone-Acetaminophen] Other (See Comments)    Pt states Norco made her extremely hyper after her surgery and she was not able to sleep.    Current Outpatient Medications  Medication Sig Dispense Refill   [facility] D3 25 MCG (1000 UT) capsule TAKE 1 CAPSULE BY MOUTH EVERY DAY 90 capsule 4   folic acid (FOLVITE) 1 MG tablet Take 2 mg by mouth every evening.     Polyethyl Glycol-Propyl Glycol (LUBRICANT EYE DROPS) 0.4-0.3 % SOLN Place 1-2 drops into both eyes 3 (three) times  daily as needed (dry eyes/irritated eyes.).     traMADol (ULTRAM) 50 MG tablet Take 1-2 tablets (50-100 mg total) by mouth every 6 (six) hours as needed. 10 tablet 0   ZINC-VITAMIN C PO Take 1 tablet by mouth daily.     No current facility-administered medications for this visit.    OBJECTIVE:  white woman who appears well  Vitals:   07/18/21 0957  BP: 133/90  Pulse: 75  Resp: 20  Temp: (!) 97.5 F (36.4 C)  SpO2: 98%     Body mass index is 27.48 kg/m.   ECOG FS:0 - Asymptomatic Filed Weights   07/18/21 0957  Weight: 167 lb 11.2 oz (76.1 kg)   Sclerae unicteric, EOMs intact Wearing a mask No cervical or supraclavicular adenopathy Lungs no rales or rhonchi Heart regular rate and rhythm Abd soft, nontender, positive bowel sounds MSK no focal spinal tenderness, no upper extremity lymphedema Neuro: nonfocal, well oriented, appropriate affect Breasts: Status post bilateral mastectomies.  There is no evidence of chest wall recurrence.  Both axillae are benign   LAB RESULTS:  CMP     Component Value Date/Time   NA 139 05/31/2020 1030   NA 141 08/05/2017 1335   K 4.1 05/31/2020 1030   K 4.5 08/05/2017 1335   CL 107 05/31/2020 1030   CO2 23 05/31/2020 1030   CO2 29 08/05/2017 1335   GLUCOSE 89 05/31/2020 1030   GLUCOSE 77 08/05/2017 1335   BUN 13 05/31/2020 1030   BUN 13.4 08/05/2017 1335   CREATININE 0.72 05/31/2020 1030   CREATININE 0.8 08/05/2017 1335   CALCIUM 9.1 05/31/2020 1030   CALCIUM 10.0 08/05/2017 1335   PROT 6.5 05/31/2020 1030   PROT 6.8 08/05/2017 1335   ALBUMIN 3.6 05/31/2020 1030   ALBUMIN 3.6 08/05/2017 1335   AST 16 05/31/2020 1030   AST 19 08/05/2017 1335   ALT 12 05/31/2020 1030   ALT 12 08/05/2017 1335   ALKPHOS 124 05/31/2020 1030   ALKPHOS 169 (H) 08/05/2017 1335   BILITOT 0.6 05/31/2020 1030   BILITOT 0.63 08/05/2017 1335   GFRNONAA >60 05/31/2020 1030   GFRAA >60 05/31/2020 1030    INo results found for: SPEP, UPEP  Lab Results   Component Value Date   WBC 6.0 07/18/2021   NEUTROABS 3.6 07/18/2021   HGB 13.3 07/18/2021   HCT 38.1 07/18/2021   MCV 88.4 07/18/2021   PLT  231 07/18/2021      Chemistry      Component Value Date/Time   NA 139 05/31/2020 1030   NA 141 08/05/2017 1335   K 4.1 05/31/2020 1030   K 4.5 08/05/2017 1335   CL 107 05/31/2020 1030   CO2 23 05/31/2020 1030   CO2 29 08/05/2017 1335   BUN 13 05/31/2020 1030   BUN 13.4 08/05/2017 1335   CREATININE 0.72 05/31/2020 1030   CREATININE 0.8 08/05/2017 1335      Component Value Date/Time   CALCIUM 9.1 05/31/2020 1030   CALCIUM 10.0 08/05/2017 1335   ALKPHOS 124 05/31/2020 1030   ALKPHOS 169 (H) 08/05/2017 1335   AST 16 05/31/2020 1030   AST 19 08/05/2017 1335   ALT 12 05/31/2020 1030   ALT 12 08/05/2017 1335   BILITOT 0.6 05/31/2020 1030   BILITOT 0.63 08/05/2017 1335       No results found for: LABCA2  No components found for: LABCA125  No results for input(s): INR in the last 168 hours.  Urinalysis    Component Value Date/Time   BILIRUBINUR neg 12/11/2015 0925   PROTEINUR neg 12/11/2015 0925   UROBILINOGEN negative 12/11/2015 0925   NITRITE neg 12/11/2015 0925   LEUKOCYTESUR Negative 12/11/2015 0925    STUDIES: No results found.   RESEARCH: enrolled in UPBEAT study; not a candidate for BR 003 because of concurrent cancer  ASSESSMENT: 70 y.o. [place] woman status post right breast upper outer quadrant biopsy 01/02/2016 for a clinical T2 N0, stage IIA invasive ductal carcinoma, grade 1, estrogen and progesterone receptor positive, HER-2 not amplified, with an MIB-1 of 10%.  (1). Right lumpectomy and sentinel lymph node sampling 01/22/2016 showed a pT1c pN0, stage IA invasive ductal carcinoma, grade 1, with close but negative margins. Repeat HER-2 testing was again not amplified   (3) Oncotype score of 18 predicts a risk of recurrence outside the breast within the next 10 years of 11% if the patient's only systemic  therapy is tamoxifen for 5 years. It also predicts no significant benefit from chemotherapy.     (4) adjuvant radiation3/14/17-4/11/17  Site/dose:   Right breast - 42.72 gy at 2.67 Gy per fraction x 16 fractions followed by an electron boost to the right breast with 10 Gy at 2 Gy per fraction x 5 fractions.    (5)  Anastrozole started 04/21/2016, discontinued after approximately 1 month with significant side effects  (a)  B[facility] density at [facility] September 2013 was normal  (b) bone density January 2018 was normal  (c) anastrozole restarted February 2018, held again at the start of chemotherapy February 2019  (6) genetics testing  02/24/2016 through the Breast/Ovarian cancer gene panel offered by [facility] found no deleterious mutations in ATM, BARD1, BRCA1, BRCA2, BRIP1, CDH1, CHEK2, EPCAM, FANCC, MLH1, MSH2, MSH6, NBN, PALB2, PMS2, PTEN, RAD51C, RAD51D, TP53, and XRCC2.   (7) left breast biopsy 12/17/2017 showed a cT2 cN0, stage IIA- IIB invasive ductal carcinoma, grade 2, estrogen receptor weakly positive, progesterone receptor negative, with HER-2 equivocal by FISH, negative by immunohistochemistry  (8) status post bilateral mastectomies and left sentinel lymph node sampling 01/04/2018 showing  (a) on the right, no evidence of disease  (b) on the left, a pT2 pN0, stage IIB invasive ductal carcinoma, grade 3, triple negative  (c) patient opted against reconstruction or prostheses  (9) adjuvant chemotherapy consisting of cyclophosphamide and doxorubicin in dose dense fashion x4 started 02/16/2018, completed 03/30/2018,  followed by weekly paclitaxel /carboplatin x12  starting 04/13/2018;  (a) Paclitaxel/Carboplatin stopped after 3 cycles due to peripheral neuropathy.  (b)  Gemcitabine Carboplatin started on 5/21, but held x 3 weeks due to prolonged neutropenia  (c) carboplatin and gemcitabine given day 1 and day 8 with OnPro support for the remaining cycles   (10) resumed anastrozole  09/21/2018   (a) DEXA scan in August 31, 2012 was normal   (b) anastrozole discontinued January 2021   PLAN: [name] is now 5-1/2 years out from initial diagnosis of her right breast cancer and 3-1/2 years out from definitive surgery for her left cancer and bilateral mastectomies.  There is no evidence of disease recurrence.  This is very favorable.  She has a persistent alkaline phosphatase elevation with no trend Results for AASHI, DERRINGTON (MRN [id number]) as of 07/18/2021 10:19  Ref. Range 11/11/2018 10:58 05/05/2019 09:50 07/19/2019 13:08 12/01/2019 09:20 05/31/2020 10:30  Alkaline Phosphatase Latest Ref Range: 38 - 126 U/L 231 (H) 153 (H) 138 (H) 154 (H) 124   Since likely due to fatty liver.  In fact the numbers have been improving because of her diet and exercise.  I offered her another prescription for bras and prostheses but at present she does not need that  She will see [place] again in a year.  At that point she can consider "graduating" from follow-up here or participating in our survivorship program  Total encounter time 20 minutes.*  Dequavious Harshberger, Virgie Dad, MD  07/18/21 10:25 AM Medical Oncology and Hematology [facility] [place], [place] [address] Tel. [phone]    Fax. [phone]   I, Wilburn Mylar, am acting as scribe for Dr. Virgie Dad. Augie Vane.  I, Lurline Del MD, have reviewed the above documentation for accuracy and completeness, and I agree with the above.   *Total Encounter Time as defined by the Centers for Medicare and Medicaid Services includes, in addition to the face-to-face time of a patient visit (documented in the note above) non-face-to-face time: obtaining and reviewing outside history, ordering and reviewing medications, tests or procedures, care coordination (communications with other health care professionals or caregivers) and documentation in the medical record.

## 2021-07-18 ENCOUNTER — Inpatient Hospital Stay: Payer: PPO | Attending: Oncology

## 2021-07-18 ENCOUNTER — Other Ambulatory Visit: Payer: Self-pay

## 2021-07-18 ENCOUNTER — Inpatient Hospital Stay: Payer: PPO | Admitting: Oncology

## 2021-07-18 VITALS — BP 133/90 | HR 75 | Temp 97.5°F | Resp 20 | Wt 167.7 lb

## 2021-07-18 DIAGNOSIS — Z170 Estrogen receptor positive status [ER+]: Secondary | ICD-10-CM | POA: Diagnosis not present

## 2021-07-18 DIAGNOSIS — C50411 Malignant neoplasm of upper-outer quadrant of right female breast: Secondary | ICD-10-CM | POA: Diagnosis not present

## 2021-07-18 DIAGNOSIS — Z853 Personal history of malignant neoplasm of breast: Secondary | ICD-10-CM | POA: Diagnosis not present

## 2021-07-18 DIAGNOSIS — Z9013 Acquired absence of bilateral breasts and nipples: Secondary | ICD-10-CM | POA: Insufficient documentation

## 2021-07-18 DIAGNOSIS — Z803 Family history of malignant neoplasm of breast: Secondary | ICD-10-CM | POA: Insufficient documentation

## 2021-07-18 DIAGNOSIS — Z9221 Personal history of antineoplastic chemotherapy: Secondary | ICD-10-CM | POA: Insufficient documentation

## 2021-07-18 DIAGNOSIS — Z780 Asymptomatic menopausal state: Secondary | ICD-10-CM | POA: Diagnosis not present

## 2021-07-18 DIAGNOSIS — Z87891 Personal history of nicotine dependence: Secondary | ICD-10-CM | POA: Diagnosis not present

## 2021-07-18 LAB — CMP (CANCER CENTER ONLY)
ALT: 7 U/L (ref 0–44)
AST: 15 U/L (ref 15–41)
Albumin: 3.7 g/dL (ref 3.5–5.0)
Alkaline Phosphatase: 118 U/L (ref 38–126)
Anion gap: 8 (ref 5–15)
BUN: 13 mg/dL (ref 8–23)
CO2: 25 mmol/L (ref 22–32)
Calcium: 9.8 mg/dL (ref 8.9–10.3)
Chloride: 107 mmol/L (ref 98–111)
Creatinine: 0.79 mg/dL (ref 0.44–1.00)
GFR, Estimated: >60 mL/min (ref >60)
Glucose, Bld: 94 mg/dL (ref 70–99)
Potassium: 4.1 mmol/L (ref 3.5–5.1)
Sodium: 140 mmol/L (ref 135–145)
Total Bilirubin: 0.6 mg/dL (ref 0.3–1.2)
Total Protein: 7 g/dL (ref 6.5–8.1)

## 2021-07-18 LAB — CBC WITH DIFFERENTIAL (CANCER CENTER ONLY)
Abs Immature Granulocytes: 0.02 10*3/uL (ref 0.00–0.07)
Basophils Absolute: 0 10*3/uL (ref 0.0–0.1)
Basophils Relative: 0 %
Eosinophils Absolute: 0.1 10*3/uL (ref 0.0–0.5)
Eosinophils Relative: 2 %
HCT: 38.1 % (ref 36.0–46.0)
Hemoglobin: 13.3 g/dL (ref 12.0–15.0)
Immature Granulocytes: 0 %
Lymphocytes Relative: 30 %
Lymphs Abs: 1.8 10*3/uL (ref 0.7–4.0)
MCH: 30.9 pg (ref 26.0–34.0)
MCHC: 34.9 g/dL (ref 30.0–36.0)
MCV: 88.4 fL (ref 80.0–100.0)
Monocytes Absolute: 0.5 10*3/uL (ref 0.1–1.0)
Monocytes Relative: 8 %
Neutro Abs: 3.6 10*3/uL (ref 1.7–7.7)
Neutrophils Relative %: 60 %
Platelet Count: 231 10*3/uL (ref 150–400)
RBC: 4.31 MIL/uL (ref 3.87–5.11)
RDW: 12.8 % (ref 11.5–15.5)
WBC Count: 6 10*3/uL (ref 4.0–10.5)
nRBC: 0 % (ref 0.0–0.2)

## 2021-07-19 ENCOUNTER — Telehealth: Payer: Self-pay | Admitting: Oncology

## 2021-07-19 NOTE — Telephone Encounter (Signed)
Scheduled appointment per 07/28 los. Left message.

## 2021-10-02 ENCOUNTER — Encounter: Payer: Self-pay | Admitting: Oncology

## 2021-10-15 ENCOUNTER — Ambulatory Visit (INDEPENDENT_AMBULATORY_CARE_PROVIDER_SITE_OTHER): Payer: PPO | Admitting: Otolaryngology

## 2021-10-15 ENCOUNTER — Other Ambulatory Visit: Payer: Self-pay

## 2021-10-15 DIAGNOSIS — H7291 Unspecified perforation of tympanic membrane, right ear: Secondary | ICD-10-CM

## 2021-10-15 DIAGNOSIS — H9041 Sensorineural hearing loss, unilateral, right ear, with unrestricted hearing on the contralateral side: Secondary | ICD-10-CM

## 2021-10-15 NOTE — Progress Notes (Signed)
HPI: Samantha Sullivan is a 70 y.o. female who returns today for evaluation of her ears.  She is gradually lost hearing in the right ear where the right ear is nonfunctional.  She is tried a cross hearing aid without much benefit.  She has had no drainage from the ear.  But on previous examination she was noted to have a right TM perforation and wanted to see if this was still present.. She has had previous MRI scans to evaluate right ear sensorineural hearing loss which did not show any acoustic neuroma or cochlear pathology.  Past Medical History:  Diagnosis Date   Abnormal CT of the chest 12/06   numeroous lung nodules - most have resolved.   Arthritis    psoriatic arthritis- toes & feet, but resolved at this point- no need meds at this point     Cancer [facility])    right breast cancer   Complication of anesthesia    /w colonoscopy- woke up before procedure was complete   Family history of adverse reaction to anesthesia    pt's mother reported difficulty being given " enough " medicine to outcome a successful anesth. experience   Family history of leukemia    Family history of ovarian cancer    History of meningioma 2007   in 2013 stable on MRI and recheck in 5 years, no surgery necessry    HSV infection 1970's- 1980's   Psoriatic arthritis ([place])    Past Surgical History:  Procedure Laterality Date   BILATERAL TOTAL MASTECTOMY WITH AXILLARY LYMPH NODE DISSECTION Bilateral 01/04/2018   Procedure: BILATERAL TOTAL MASTECTOMY PROPHYLATIC RIGHT  WITH LEFT SENTINEAL  LYMPH NODE;  Surgeon: Excell Seltzer, MD;  Location: [place];  Service: General;  Laterality: Bilateral;   BREAST LUMPECTOMY WITH RADIOACTIVE SEED AND SENTINEL LYMPH NODE BIOPSY Right 01/22/2016   Procedure: RIGHT BREAST LUMPECTOMY WITH RADIOACTIVE SEED AND SENTINEL LYMPH NODE BIOPSY;  Surgeon: Excell Seltzer, MD;  Location: [place];  Service: General;  Laterality: Right;   BREAST SURGERY Left 1971, 2016    lumpectomy benign, 2016- Br. Ca- R lumpectomy & radiation    COLONOSCOPY  8/06   normal and recheck in 10 years   HAMMER TOE SURGERY Left 2016   MASTECTOMY Right 01/04/2018   PROPHYLATIC    MASTECTOMY COMPLETE / SIMPLE W/ SENTINEL NODE BIOPSY Left 01/04/2018   PORT-A-CATH REMOVAL Left 07/09/2020   Procedure: REMOVAL OF PORT;  Surgeon: Jovita Kussmaul, MD;  Location: [place];  Service: General;  Laterality: Left;   PORTACATH PLACEMENT Right 01/04/2018   Procedure: INSERTION PORT-A-CATH;  Surgeon: Excell Seltzer, MD;  Location: [place];  Service: General;  Laterality: Right;   THYROIDECTOMY, PARTIAL Left 1983   Social History   Socioeconomic History   Marital status: Widowed    Spouse name: Not on file   Number of children: 2   Years of education: Not on file   Highest education level: Not on file  Occupational History   Not on file  Tobacco Use   Smoking status: Former    Packs/day: 1.00    Years: 12.00    Pack years: 12.00    Types: Cigarettes    Quit date: 07/23/1981    Years since quitting: 40.2   Smokeless tobacco: Never  Substance and Sexual Activity   Alcohol use: Yes    Alcohol/week: 0.0 standard drinks    Comment: social occasion only- special    Drug use: No   Sexual  activity: Never    Partners: Male    Birth control/protection: Post-menopausal  Other Topics Concern   Not on file  Social History Narrative   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   She has been divorced since about 36 - he has passed since then.   Social Determinants of Health   Financial Resource Strain: Not on file  Food Insecurity: Not on file  Transportation Needs: Not on file  Physical Activity: Not on file  Stress: Not on file  Social Connections: Not on file   Family History  Problem Relation Age of Onset   Osteoporosis Mother    Osteoporosis Sister    Lung cancer Maternal Aunt        smoker   Heart Problems Maternal Grandmother    COPD Maternal Grandfather     Neurodegenerative disease Maternal Aunt        corical-ganglion degeneration   Ovarian cancer Maternal Aunt        dx in her 9s   Leukemia Cousin        73s-40s; maternal cousin   Allergies  Allergen Reactions   Bee Venom    Ciprofloxacin    Levaquin [Levofloxacin In D5w]    Norco [Hydrocodone-Acetaminophen] Other (See Comments)    Pt states Norco made her extremely hyper after her surgery and she was not able to sleep.   Prior to Admission medications   Medication Sig Start Date End Date Taking? Authorizing Provider  CVS D3 25 MCG (1000 UT) capsule TAKE 1 CAPSULE BY MOUTH EVERY DAY 12/09/19   Magrinat, Virgie Dad, MD  folic acid (FOLVITE) 1 MG tablet Take 2 mg by mouth every evening.    [provider]  Polyethyl Glycol-Propyl Glycol (LUBRICANT EYE DROPS) 0.4-0.3 % SOLN Place 1-2 drops into both eyes 3 (three) times daily as needed (dry eyes/irritated eyes.).    [provider]  traMADol (ULTRAM) 50 MG tablet Take 1-2 tablets (50-100 mg total) by mouth every 6 (six) hours as needed. 07/09/20   Jovita Kussmaul, MD  ZINC-VITAMIN C PO Take 1 tablet by mouth daily. 04/22/19   [provider]     Positive ROS: Otherwise negative  All other systems have been reviewed and were otherwise negative with the exception of those mentioned in the HPI and as above.  Physical Exam: Constitutional: Alert, well-appearing, no acute distress Ears: External ears without lesions or tenderness. Ear canals are clear bilaterally.  Right TM with a moderate size posterior central TM perforation which is dry.  Left TM is intact.  Minimal wax buildup. Nasal: External nose without lesions. Septum with minimal deformity and mild rhinitis. Clear nasal passages bilaterally. Oral: Lips and gums without lesions. Tongue and palate mucosa without lesions. Posterior oropharynx clear. Neck: No palpable adenopathy or masses Respiratory: Breathing comfortably  Skin: No facial/neck lesions or  rash noted.  Procedures  Assessment: Chronic right TM perforation. Sensorineural hearing loss in the right ear severe.  Mild left ear sensorineural hearing loss  Plan: She is presently wearing a hearing aid in the left ear. She inquired about whether to have the perforation fixed in the right ear.  I reviewed with her that repairing perforation would not improve her hearing but would allow her to safely get water in the ear.  I discussed with her concerning use of earplugs and swim cap when she goes swimming.   Radene Journey, MD

## 2022-01-02 DIAGNOSIS — M8589 Other specified disorders of bone density and structure, multiple sites: Secondary | ICD-10-CM | POA: Diagnosis not present

## 2022-02-19 ENCOUNTER — Telehealth: Payer: Self-pay | Admitting: Oncology

## 2022-02-19 NOTE — Telephone Encounter (Signed)
02/19/22 - Upbeat Study - 4 year follow-up phone call.  Called and spoke to patient on the phone.  I informed the patient that this was for her 4 year follow-up call for the research study she is on. The patient stated she is going good.  She stated she has not been in the hospital nor had she had any cardiovascular events in the last year.  I informed her that the questionnaires would be sent to her e-mail for her to complete and send back.  I confirmed her e-mail  address that I have is correct.  I thanked the patient for her continued support in this clinical trial. ?[name] ?02/19/22 - 12:05 pm ?

## 2022-04-10 DIAGNOSIS — E559 Vitamin D deficiency, unspecified: Secondary | ICD-10-CM | POA: Diagnosis not present

## 2022-04-10 DIAGNOSIS — R7989 Other specified abnormal findings of blood chemistry: Secondary | ICD-10-CM | POA: Diagnosis not present

## 2022-05-02 DIAGNOSIS — Z91038 Other insect allergy status: Secondary | ICD-10-CM | POA: Diagnosis not present

## 2022-05-02 DIAGNOSIS — M8580 Other specified disorders of bone density and structure, unspecified site: Secondary | ICD-10-CM | POA: Diagnosis not present

## 2022-05-02 DIAGNOSIS — G4700 Insomnia, unspecified: Secondary | ICD-10-CM | POA: Diagnosis not present

## 2022-05-02 DIAGNOSIS — Z853 Personal history of malignant neoplasm of breast: Secondary | ICD-10-CM | POA: Diagnosis not present

## 2022-05-02 DIAGNOSIS — H9190 Unspecified hearing loss, unspecified ear: Secondary | ICD-10-CM | POA: Diagnosis not present

## 2022-05-02 DIAGNOSIS — Z0000 Encounter for general adult medical examination without abnormal findings: Secondary | ICD-10-CM | POA: Diagnosis not present

## 2022-05-02 DIAGNOSIS — Z1339 Encounter for screening examination for other mental health and behavioral disorders: Secondary | ICD-10-CM | POA: Diagnosis not present

## 2022-05-02 DIAGNOSIS — R82998 Other abnormal findings in urine: Secondary | ICD-10-CM | POA: Diagnosis not present

## 2022-05-02 DIAGNOSIS — I8390 Asymptomatic varicose veins of unspecified lower extremity: Secondary | ICD-10-CM | POA: Diagnosis not present

## 2022-05-02 DIAGNOSIS — E559 Vitamin D deficiency, unspecified: Secondary | ICD-10-CM | POA: Diagnosis not present

## 2022-05-02 DIAGNOSIS — Z1331 Encounter for screening for depression: Secondary | ICD-10-CM | POA: Diagnosis not present

## 2022-07-11 ENCOUNTER — Encounter: Payer: Self-pay | Admitting: Oncology

## 2022-07-18 ENCOUNTER — Ambulatory Visit: Payer: PPO | Admitting: Hematology and Oncology

## 2022-07-18 ENCOUNTER — Other Ambulatory Visit: Payer: PPO

## 2023-03-03 ENCOUNTER — Telehealth: Payer: Self-pay

## 2023-03-03 DIAGNOSIS — C50411 Malignant neoplasm of upper-outer quadrant of right female breast: Secondary | ICD-10-CM

## 2023-03-03 NOTE — Telephone Encounter (Signed)
[id number] Understanding and Predicting Breast Cancer Events after Treatment (UPBEAT)  Called the patient to complete their year 5 study procedures. Patient didn't answer so, I left a message and will call tomorrow.   Johny Drilling, [facility] 03/03/2023 3:13 PM

## 2023-03-18 ENCOUNTER — Telehealth: Payer: Self-pay

## 2023-03-18 DIAGNOSIS — Z170 Estrogen receptor positive status [ER+]: Secondary | ICD-10-CM

## 2023-03-18 NOTE — Telephone Encounter (Signed)
[id number] Understanding and Predicting Breast Cancer Events after Treatment (UPBEAT)   [place] PHONE CALL: Confirmed I was speaking with Samantha Sullivan. Informed patient call was for a 5 year follow-up for the above mentioned study. Patient states she is doing well and has had no cardiac events to report. This clinical research coordinator verified patient's e-mail address and surveys were sent. Patient was thanked for her time and continued support of the above mentioned study   Johny Drilling, [facility] 03/18/2023 4:44 PM

## 2024-01-18 ENCOUNTER — Telehealth: Payer: Self-pay

## 2024-01-18 NOTE — Telephone Encounter (Signed)
WF [id number] Understanding and Predicting Breast Cancer Events after Treatment (UPBEAT)  Attempted to reach Ms Lambing for her yearly phone call for the above study. No answer; left VM with my contact information.   Margret Chance Avanthika Dehnert, RN, BSN, [facility] She  Her  Hers Clinical Research Nurse [facility] Direct Dial [phone] 01/18/2024 2:14 PM

## 2024-01-25 ENCOUNTER — Telehealth: Payer: Self-pay

## 2024-01-25 NOTE — Telephone Encounter (Signed)
WF [id number] Understanding and Predicting Breast Cancer Events after Treatment (UPBEAT)  Called patient for yearly study check in. No answer, left VM with my contact information.  Margret Chance Tesneem Dufrane, RN, BSN, [facility] She  Her  Hers Clinical Research Nurse [facility] Direct Dial [phone] 01/25/2024 1:12 PM

## 2024-01-25 NOTE — Telephone Encounter (Signed)
WF [id number] Understanding and Predicting Breast Cancer Events after Treatment (UPBEAT)  Ms Artus returned my call and answered questions for the above-named study.  Margret Chance Caroline Matters, RN, BSN, [facility] She  Her  Hers Clinical Research Nurse [facility] Direct Dial [phone] 01/25/2024 2:23 PM
